# Patient Record
Sex: Male | Born: 1945 | Race: White | Hispanic: No | Marital: Married | Smoking: Former smoker
Health system: Southern US, Community
[De-identification: ages and names within clinical notes are randomized; demographics above are authoritative.]

## PROBLEM LIST (undated history)

## (undated) DIAGNOSIS — I10 Essential (primary) hypertension: Secondary | ICD-10-CM

## (undated) DIAGNOSIS — F4310 Post-traumatic stress disorder, unspecified: Secondary | ICD-10-CM

## (undated) DIAGNOSIS — I739 Peripheral vascular disease, unspecified: Secondary | ICD-10-CM

## (undated) DIAGNOSIS — F419 Anxiety disorder, unspecified: Secondary | ICD-10-CM

## (undated) DIAGNOSIS — I219 Acute myocardial infarction, unspecified: Secondary | ICD-10-CM

## (undated) DIAGNOSIS — I2510 Atherosclerotic heart disease of native coronary artery without angina pectoris: Secondary | ICD-10-CM

## (undated) DIAGNOSIS — F32A Depression, unspecified: Secondary | ICD-10-CM

## (undated) DIAGNOSIS — J189 Pneumonia, unspecified organism: Secondary | ICD-10-CM

## (undated) DIAGNOSIS — F329 Major depressive disorder, single episode, unspecified: Secondary | ICD-10-CM

## (undated) DIAGNOSIS — G4730 Sleep apnea, unspecified: Secondary | ICD-10-CM

## (undated) DIAGNOSIS — G589 Mononeuropathy, unspecified: Secondary | ICD-10-CM

## (undated) DIAGNOSIS — E7800 Pure hypercholesterolemia, unspecified: Secondary | ICD-10-CM

## (undated) DIAGNOSIS — K759 Inflammatory liver disease, unspecified: Secondary | ICD-10-CM

## (undated) DIAGNOSIS — C801 Malignant (primary) neoplasm, unspecified: Secondary | ICD-10-CM

## (undated) DIAGNOSIS — G5600 Carpal tunnel syndrome, unspecified upper limb: Secondary | ICD-10-CM

## (undated) DIAGNOSIS — I639 Cerebral infarction, unspecified: Secondary | ICD-10-CM

## (undated) HISTORY — PX: KNEE ARTHROSCOPY: SUR90

## (undated) HISTORY — PX: EYE SURGERY: SHX253

## (undated) HISTORY — PX: LUMBAR SPINE SURGERY: SHX701

## (undated) HISTORY — PX: TONSILLECTOMY: SUR1361

## (undated) HISTORY — PX: NASAL SINUS SURGERY: SHX719

## (undated) HISTORY — PX: COLONOSCOPY: SHX174

## (undated) HISTORY — PX: CARDIAC CATHETERIZATION: SHX172

## (undated) HISTORY — PX: CATARACT EXTRACTION: SUR2

## (undated) HISTORY — PX: CARPAL TUNNEL RELEASE: SHX101

## (undated) HISTORY — PX: CORONARY ANGIOPLASTY: SHX604

---

## 1998-08-11 ENCOUNTER — Emergency Department (HOSPITAL_COMMUNITY): Admission: EM | Admit: 1998-08-11 | Discharge: 1998-08-11 | Payer: Self-pay | Admitting: Emergency Medicine

## 1998-08-11 ENCOUNTER — Encounter: Payer: Self-pay | Admitting: Emergency Medicine

## 1998-08-12 ENCOUNTER — Ambulatory Visit (HOSPITAL_COMMUNITY): Admission: RE | Admit: 1998-08-12 | Discharge: 1998-08-12 | Payer: Self-pay | Admitting: Internal Medicine

## 1998-08-18 ENCOUNTER — Encounter: Payer: Self-pay | Admitting: Pulmonary Disease

## 1998-08-18 ENCOUNTER — Ambulatory Visit (HOSPITAL_COMMUNITY): Admission: RE | Admit: 1998-08-18 | Discharge: 1998-08-18 | Payer: Self-pay | Admitting: Pulmonary Disease

## 1999-05-26 ENCOUNTER — Ambulatory Visit (HOSPITAL_BASED_OUTPATIENT_CLINIC_OR_DEPARTMENT_OTHER): Admission: RE | Admit: 1999-05-26 | Discharge: 1999-05-26 | Payer: Self-pay | Admitting: Orthopedic Surgery

## 2010-06-26 HISTORY — PX: OTHER SURGICAL HISTORY: SHX169

## 2011-04-17 ENCOUNTER — Emergency Department (HOSPITAL_COMMUNITY): Payer: Non-veteran care

## 2011-04-17 ENCOUNTER — Inpatient Hospital Stay (HOSPITAL_COMMUNITY)
Admission: EM | Admit: 2011-04-17 | Discharge: 2011-04-18 | DRG: 313 | Disposition: A | Discharge status: Home / Self Care | Payer: Non-veteran care | Source: Ambulatory Visit | Attending: Cardiology | Admitting: Cardiology

## 2011-04-17 DIAGNOSIS — Z87891 Personal history of nicotine dependence: Secondary | ICD-10-CM

## 2011-04-17 DIAGNOSIS — R079 Chest pain, unspecified: Secondary | ICD-10-CM

## 2011-04-17 DIAGNOSIS — G5600 Carpal tunnel syndrome, unspecified upper limb: Secondary | ICD-10-CM | POA: Diagnosis present

## 2011-04-17 DIAGNOSIS — Z8673 Personal history of transient ischemic attack (TIA), and cerebral infarction without residual deficits: Secondary | ICD-10-CM

## 2011-04-17 DIAGNOSIS — F4310 Post-traumatic stress disorder, unspecified: Secondary | ICD-10-CM | POA: Diagnosis present

## 2011-04-17 DIAGNOSIS — I10 Essential (primary) hypertension: Secondary | ICD-10-CM | POA: Diagnosis present

## 2011-04-17 DIAGNOSIS — E669 Obesity, unspecified: Secondary | ICD-10-CM | POA: Diagnosis present

## 2011-04-17 DIAGNOSIS — Z7982 Long term (current) use of aspirin: Secondary | ICD-10-CM

## 2011-04-17 DIAGNOSIS — I2510 Atherosclerotic heart disease of native coronary artery without angina pectoris: Secondary | ICD-10-CM | POA: Diagnosis present

## 2011-04-17 DIAGNOSIS — R0789 Other chest pain: Principal | ICD-10-CM | POA: Diagnosis present

## 2011-04-17 LAB — POCT I-STAT, CHEM 8
BUN: 15 mg/dL (ref 6–23)
Calcium, Ion: 1.2 mmol/L (ref 1.12–1.32)
Chloride: 104 mEq/L (ref 96–112)
Creatinine, Ser: 0.7 mg/dL (ref 0.50–1.35)
Glucose, Bld: 132 mg/dL — ABNORMAL HIGH (ref 70–99)
HCT: 33 % — ABNORMAL LOW (ref 39.0–52.0)
Hemoglobin: 11.2 g/dL — ABNORMAL LOW (ref 13.0–17.0)
Potassium: 3.6 mEq/L (ref 3.5–5.1)
Sodium: 141 mEq/L (ref 135–145)
TCO2: 24 mmol/L (ref 0–100)

## 2011-04-17 LAB — DIFFERENTIAL
Basophils Absolute: 0 10*3/uL (ref 0.0–0.1)
Basophils Relative: 0 % (ref 0–1)
Eosinophils Absolute: 0.2 10*3/uL (ref 0.0–0.7)
Eosinophils Relative: 2 % (ref 0–5)
Lymphocytes Relative: 38 % (ref 12–46)
Lymphs Abs: 2.8 10*3/uL (ref 0.7–4.0)
Monocytes Absolute: 0.6 10*3/uL (ref 0.1–1.0)
Monocytes Relative: 8 % (ref 3–12)
Neutro Abs: 3.8 10*3/uL (ref 1.7–7.7)
Neutrophils Relative %: 52 % (ref 43–77)

## 2011-04-17 LAB — CBC
MCV: 84.7 fL (ref 78.0–100.0)
Platelets: 171 10*3/uL (ref 150–400)
RDW: 12.2 % (ref 11.5–15.5)
WBC: 7.4 10*3/uL (ref 4.0–10.5)

## 2011-04-18 DIAGNOSIS — R079 Chest pain, unspecified: Secondary | ICD-10-CM

## 2011-04-18 LAB — COMPREHENSIVE METABOLIC PANEL
Alkaline Phosphatase: 88 U/L (ref 39–117)
BUN: 15 mg/dL (ref 6–23)
Chloride: 103 mEq/L (ref 96–112)
Creatinine, Ser: 0.7 mg/dL (ref 0.50–1.35)
GFR calc Af Amer: >90 mL/min (ref >90)
Glucose, Bld: 136 mg/dL — ABNORMAL HIGH (ref 70–99)
Potassium: 3.7 mEq/L (ref 3.5–5.1)
Total Bilirubin: 0.3 mg/dL (ref 0.3–1.2)
Total Protein: 6.5 g/dL (ref 6.0–8.3)

## 2011-04-18 LAB — CBC
HCT: 35.2 % — ABNORMAL LOW (ref 39.0–52.0)
MCH: 30.6 pg (ref 26.0–34.0)
MCHC: 36.4 g/dL — ABNORMAL HIGH (ref 30.0–36.0)
MCV: 84.2 fL (ref 78.0–100.0)
Platelets: 162 10*3/uL (ref 150–400)
RDW: 12.2 % (ref 11.5–15.5)
WBC: 6.1 10*3/uL (ref 4.0–10.5)

## 2011-04-18 LAB — CK TOTAL AND CKMB (NOT AT ARMC): Total CK: 66 U/L (ref 7–232)

## 2011-04-18 LAB — CARDIAC PANEL(CRET KIN+CKTOT+MB+TROPI)
CK, MB: 2.1 ng/mL (ref 0.3–4.0)
Relative Index: INVALID (ref 0.0–2.5)
Total CK: 71 U/L (ref 7–232)
Troponin I: <0.3 ng/mL (ref <0.30)

## 2011-04-18 LAB — PROTIME-INR: Prothrombin Time: 13.3 seconds (ref 11.6–15.2)

## 2011-04-18 LAB — POCT I-STAT TROPONIN I: Troponin i, poc: 0 ng/mL (ref 0.00–0.08)

## 2011-04-18 LAB — MRSA PCR SCREENING: MRSA by PCR: NEGATIVE

## 2011-04-18 NOTE — H&P (Signed)
NAME:  NIKOLAUS, PIENTA NO.:  [id number]  MEDICAL RECORD NO.:  [id number]  LOCATION:  [facility]                         FACILITY:  [facility]  PHYSICIAN:  Zacarias Pontes, MD       DATE OF BIRTH:  [birth year]  DATE OF ADMISSION:  04/18/2011 DATE OF DISCHARGE:                             HISTORY & PHYSICAL   PRIMARY CARDIOLOGIST:  At the [facility], Dr. Yetta Barre.  CHIEF COMPLAINT:  Chest pain.  HISTORY OF PRESENT ILLNESS:  Mr. 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  In preparation for elective carpal tunnel surgery, the patient underwent preop coronary angiography at the [facility] several weeks ago, which reportedly revealed focal disease which they reportedly attempt to revascularize via PCI.  PCI was not successful and was complicated by a ramus dissection.  All of this is per the patient and the family, as we have no official records to review tonight.  Mr. Prime spent approximately 1 week in the hospital and was discharged approximately 10 days ago. His hospitalization was further complicated by what sounds to have a been a potentially asystolic arrest after receiving an IV dose of Dilaudid. He regained a pulse promptly, but here was  subsequently clinical concern for a resulting stroke affecting his left side. All of this is per the wife as we do not yet have records to confirm the account.  The tentative plan had been for a repeat catheterization scheduled for next Monday.  Today, he has had left chest and arm burning pain for the better part of the day.  He denies nausea and vomiting.  He does report some associated diaphoresis.  The pain starts in the left chest and radiates to his side and then up to his shoulder and subsequently down his arm.  He describes it as a burning.  He reports  having felt milder versions of these symptoms over the preceding week and a half, but nothing to the extent of what he has felt today.  He denies shortness of breath.  He denies PND and denies orthopnea.  No palpitations.  No fevers and no sick contacts.  No diarrhea.  PAST MEDICAL HISTORY: 1. Coronary artery disease, reportedly diagnosed at the [facility]     several weeks ago.  By the family's report, there was narrowing     that the doctors there attempted to fix, but the procedure was     complicated by dissection of the ramus intermedius.  We have no     further information at this time and we will work to obtain records     to clarify his anatomy and his overall course during his     hospitalization. 2. 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 3. PTSD. 4. Hypertension. 5. Obesity. 6. Possible CVA whilke hospitalized at [facility] a few weeks ago  SOCIAL HISTORY:  Remote tobacco having quit approximately 15 years ago. He is married and is accompanied tonight in the emergency room by his wife, as well  as other family members.  FAMILY HISTORY:  Noncontributory.  REVIEW OF SYSTEMS:  As per HPI otherwise is comprehensively negative.  ALLERGIES:  DILAUDID and CLINDAMYCIN.  MEDICATIONS: 1. Carvedilol 3.125 mg p.o. b.i.d. 2. Clopidogrel 75 mg daily. 3. Pravastatin 40 mg daily. 4. Ondansetron 8 mg p.o. p.r.n. nausea. 5. Aspirin 81 mg daily. 6. Bupropion 100 mg daily. 7. Clonazepam 1 mg p.o. at bedtime. 8. Gabapentin 100 mg p.o. b.i.d. 9. Sertraline 200 mg p.o. daily.  PHYSICAL EXAMINATION:  VITAL SIGNS:  On physical exam, patient's heart rate is 62 with a blood pressure of 125/70.  He is breathing comfortably 16 times a minute, satting 98% on 2 L nasal cannula. GENERAL:  He is in mild distress due to his chest and left arm burning pain which is ongoing during my evaluation. HEENT:  Notable for a supple neck with no masses or lymphadenopathy. His JVP does not appear to be  appreciably elevated. CARDIAC:  Notable for normal S1, S2 with no murmurs, rubs, or gallops. LUNGS:  Clear to auscultation bilaterally with no crackles or wheezing present. ABDOMEN:  Soft, nontender, nondistended, but obese.  No abdominal bruits are appreciated. EXTREMITIES:  Warm and well perfused with no lower extremity edema present. NEUROLOGICAL:  He is alert and oriented x3. MUSCULOSKELETAL:  His strength appears symmetric in his bilateral lower extremities with subtle weakness in his left upper extremity compared with his right, though his effort was perhaps limited by his ongoing pain issues.  LABORATORY EVALUATION:  White blood cell count is 7000, hematocrit 33, platelets 171.  Sodium 139, potassium 3.7, chloride 103, bicarb 26, BUN 15, creatinine 0.7 with a glucose of 136.  INR is 0.9 with a troponin of 0 done in point of care fashion.  ECG demonstrates normal sinus rhythm with no ST-segment abnormalities and no Q-waves.  IMPRESSION:  This is a 65 year old gentleman with hypertension and reported coronary artery disease presenting with a full day of debilitating left arm and chest burning pain with no evidence by ECG or biomarkers of ischemia thus far.  Given the recent reported history at the [place] in [place] we are obligated to carefully monitor his symptoms and thoroughly rule out an myocardial infarction.  However, I also wonder whether he has a distinct musculoskeletal component to his symptoms given his description and their atypical nature for a coronary driven process.  PLAN:  We will admit him to the step-down unit on intravenous heparin with a plan to wean his nitroglycerin infusion given his headache. Should his next set of cardiac biomarkers return negative, we will discontinue the heparin in light of his reported history at the [place] of having had a mild stroke in the setting of Dilaudid-induced hypotension following his catheterization.  We will continue to trend  his enzymes and check serial ECGs.  We will also continue his beta-blocker, his statin, and his dual anti-platelet therapy in the form of aspirin and Plavix.  Perhaps most importantly we will work to obtain [place] records including his catheterization which will help [place] define his coronary anatomy.  Given what I think is a clinical likelihood of an inflammatory chest wall and musculoskeletal process, we will empirically give a trial of IV ketorolac at add 15 mg x1 in the emergency room and assess his response accordingly.  I am hopeful that we will be able to come off the nitroglycerin infusion and similarly hopeful that with the next set of negative enzymes, we will be able to come off of the  intravenous heparin in light of his mild stroke a couple of weeks ago.  We will then work to consolidate the [place] records with his clinical presentation here.  I discussed the plan with the patient and with his family.  They voiced understanding.  Their questions were answered to the best of my ability.          ______________________________ Zacarias Pontes, MD     DM/MEDQ  D:  04/18/2011  T:  04/18/2011  Job:  [id number]  Electronically Signed by Zacarias Pontes MD on 04/18/2011 06:26:04 AM

## 2011-04-20 NOTE — Discharge Summary (Addendum)
[name], [name] NO.:  [id number]  MEDICAL RECORD NO.:  [id number]  LOCATION:  2923                         FACILITY:  [facility]  PHYSICIAN:  Vesta Mixer, M.D. DATE OF BIRTH:  July 05, 1945  DATE OF ADMISSION:  04/17/2011 DATE OF DISCHARGE:  04/18/2011                              DISCHARGE SUMMARY   DISCHARGE DIAGNOSES: 1. Chest pain, felt musculoskeletal.     a.     Negative cardiac enzymes.     b.     Plan catheterization at the [facility] as previously      scheduled. 2. Coronary artery disease, per the patient report diagnosed at [facility] several weeks ago.  Per family's report, there was narrowing     that was attempted to be fix, but the procedure was complicated by     dissection of the ramus intermedius, and [facility]. 3. Possible cerebrovascular accident during last admission at the     [facility] in the setting of Dilaudid-induced hypotension following     his catheterization per report. 4. Posttraumatic stress disorder. 5. Hypertension. 6. Carpal tunnel on the right wrist. 7. Obesity.  HOSPITAL COURSE:  [name] [name] is a 65 year old gentleman with the above history who presented to [facility] for left chest, arm and burning pain for the better part of the day without nausea or vomiting. He did associate some diaphoresis with the episode.  He described as a burning sensation, but denied any shortness of breath.  Initial enzymes were negative.  An EKG demonstrates normal sinus rhythm and ST-segment abnormalities and no Q-waves.  The admitting physician suspected a component of musculoskeletal etiology and he was subsequently treated with ketorolac in the ER.  He was observed in the Cardiac ICU Step-Down Unit and overnight became pain free.  Cardiac enzymes remained negative x3.  Dr. Elease Hashimoto has seen and examined him today and felt he is stable for discharge, and should follow up with the [facility]  as previously scheduled.  DISCHARGE LABORATORY DATA:  WBC 6.1, hemoglobin 12.8, hematocrit 35.2, platelet count 162.  Sodium 141, potassium 3.6, chloride 102, CO2 26, glucose 132, BUN 15, creatinine 0.70.  LFTs were normal.  Amylase and lipase were normal.  Cardiac enzymes are negative x3.  STUDIES:  Chest x-ray on April 18, 2011, showed mild vascular congestion noted with mild bibasilar airspace opacities could reflects multifocal pneumonia or very possible mild edema.  DISCHARGE MEDICATIONS: 1. Aspirin 81 mg daily. 2. Bupropion 100 mg daily. 3. Coreg 3.125 mg b.i.d. 4. Clonazepam 1 mg daily at bedtime p.r.n. sleep. 5. Clopidogrel 75 mg every morning. 6. Gabapentin 100 mg b.i.d. 7. Nitroglycerin sublingual 0.4 mg every 5 minutes as needed up to 3     doses for chest pain. 8. Ondansetron 8 mg half tablet q.8 h. p.r.n. nausea. 9. Pravachol 80 mg at bedtime. 10.Sertraline 200 mg daily.  DISPOSITION:  [name] [name] will be discharged in stable condition to home. He is instructed to increase activity slowly and follow a low-sodium, heart-healthy diet.  He should keep his IV site clean and  dry.  He will follow up with the [facility] as previously scheduled and anticipation for his catheterization.  DURATION OF DISCHARGE ENCOUNTERED:  Greater than 30 minutes including physician and PA time.     Ronie Spies, P.A.C.   ______________________________ Vesta Mixer, M.D.    DD/MEDQ  D:  04/18/2011  T:  04/19/2011  Job:  [id number]  Electronically Signed by Kristeen Miss M.D. on 04/20/2011 05:42:25 AM Electronically Signed by Ronie Spies  on 04/25/2011 01:57:49 PM MedRecNo: [id number] [facility], Account: [id number], DocSeq: [id number] Dictation error: Gabapentin should read t.i.d., not b.i.d. Electronically Signed by Ronie Spies  on 04/20/2011 10:26:44 AM

## 2011-06-05 ENCOUNTER — Emergency Department (HOSPITAL_COMMUNITY): Payer: Non-veteran care

## 2011-06-05 ENCOUNTER — Inpatient Hospital Stay (HOSPITAL_COMMUNITY)
Admission: EM | Admit: 2011-06-05 | Discharge: 2011-06-08 | DRG: 057 | Disposition: A | Discharge status: Home Health | Payer: Non-veteran care | Source: Ambulatory Visit | Attending: Family Medicine | Admitting: Family Medicine

## 2011-06-05 ENCOUNTER — Other Ambulatory Visit: Payer: Self-pay

## 2011-06-05 ENCOUNTER — Encounter: Payer: Self-pay | Admitting: Emergency Medicine

## 2011-06-05 DIAGNOSIS — I69998 Other sequelae following unspecified cerebrovascular disease: Principal | ICD-10-CM

## 2011-06-05 DIAGNOSIS — R42 Dizziness and giddiness: Secondary | ICD-10-CM

## 2011-06-05 DIAGNOSIS — I10 Essential (primary) hypertension: Secondary | ICD-10-CM | POA: Diagnosis present

## 2011-06-05 DIAGNOSIS — Z7982 Long term (current) use of aspirin: Secondary | ICD-10-CM

## 2011-06-05 DIAGNOSIS — H532 Diplopia: Secondary | ICD-10-CM | POA: Diagnosis present

## 2011-06-05 DIAGNOSIS — H02409 Unspecified ptosis of unspecified eyelid: Secondary | ICD-10-CM | POA: Diagnosis present

## 2011-06-05 DIAGNOSIS — I2510 Atherosclerotic heart disease of native coronary artery without angina pectoris: Secondary | ICD-10-CM | POA: Diagnosis present

## 2011-06-05 DIAGNOSIS — E785 Hyperlipidemia, unspecified: Secondary | ICD-10-CM | POA: Diagnosis present

## 2011-06-05 DIAGNOSIS — R29810 Facial weakness: Secondary | ICD-10-CM | POA: Diagnosis present

## 2011-06-05 DIAGNOSIS — Z87891 Personal history of nicotine dependence: Secondary | ICD-10-CM

## 2011-06-05 DIAGNOSIS — R471 Dysarthria and anarthria: Secondary | ICD-10-CM | POA: Diagnosis present

## 2011-06-05 DIAGNOSIS — R209 Unspecified disturbances of skin sensation: Secondary | ICD-10-CM | POA: Diagnosis present

## 2011-06-05 DIAGNOSIS — Z7902 Long term (current) use of antithrombotics/antiplatelets: Secondary | ICD-10-CM

## 2011-06-05 DIAGNOSIS — Z9861 Coronary angioplasty status: Secondary | ICD-10-CM

## 2011-06-05 DIAGNOSIS — I639 Cerebral infarction, unspecified: Secondary | ICD-10-CM | POA: Diagnosis present

## 2011-06-05 HISTORY — DX: Atherosclerotic heart disease of native coronary artery without angina pectoris: I25.10

## 2011-06-05 HISTORY — DX: Cerebral infarction, unspecified: I63.9

## 2011-06-05 LAB — DIFFERENTIAL
Basophils Absolute: 0 10*3/uL (ref 0.0–0.1)
Basophils Relative: 0 % (ref 0–1)
Eosinophils Relative: 2 % (ref 0–5)
Monocytes Absolute: 0.6 10*3/uL (ref 0.1–1.0)
Monocytes Relative: 9 % (ref 3–12)
Neutro Abs: 3.5 10*3/uL (ref 1.7–7.7)

## 2011-06-05 LAB — APTT: aPTT: 35 seconds (ref 24–37)

## 2011-06-05 LAB — COMPREHENSIVE METABOLIC PANEL
Albumin: 3.8 g/dL (ref 3.5–5.2)
BUN: 14 mg/dL (ref 6–23)
CO2: 26 mEq/L (ref 19–32)
Calcium: 9.6 mg/dL (ref 8.4–10.5)
Chloride: 105 mEq/L (ref 96–112)
Creatinine, Ser: 0.71 mg/dL (ref 0.50–1.35)
GFR calc non Af Amer: >90 mL/min (ref >90)
Total Bilirubin: 0.4 mg/dL (ref 0.3–1.2)

## 2011-06-05 LAB — POCT I-STAT, CHEM 8
Creatinine, Ser: 0.8 mg/dL (ref 0.50–1.35)
Glucose, Bld: 112 mg/dL — ABNORMAL HIGH (ref 70–99)
HCT: 36 % — ABNORMAL LOW (ref 39.0–52.0)
Hemoglobin: 12.2 g/dL — ABNORMAL LOW (ref 13.0–17.0)
TCO2: 26 mmol/L (ref 0–100)

## 2011-06-05 LAB — CBC
HCT: 35.8 % — ABNORMAL LOW (ref 39.0–52.0)
Hemoglobin: 13.1 g/dL (ref 13.0–17.0)
MCHC: 36.6 g/dL — ABNORMAL HIGH (ref 30.0–36.0)
MCV: 85.2 fL (ref 78.0–100.0)
RDW: 12.5 % (ref 11.5–15.5)

## 2011-06-05 LAB — CK TOTAL AND CKMB (NOT AT ARMC)
CK, MB: 2.5 ng/mL (ref 0.3–4.0)
Relative Index: 2.3 (ref 0.0–2.5)
Total CK: 107 U/L (ref 7–232)

## 2011-06-05 MED ORDER — CLOPIDOGREL BISULFATE 75 MG PO TABS
75.0000 mg | ORAL_TABLET | Freq: Every day | ORAL | Status: DC
  Administered 2011-06-06 – 2011-06-08 (×3): 75 mg via ORAL
  Filled 2011-06-05 (×3): qty 1

## 2011-06-05 MED ORDER — ONDANSETRON HCL 4 MG/2ML IJ SOLN
4.0000 mg | Freq: Once | INTRAMUSCULAR | Status: AC
  Administered 2011-06-05: 4 mg via INTRAVENOUS
  Filled 2011-06-05: qty 2

## 2011-06-05 MED ORDER — NITROGLYCERIN 0.4 MG SL SUBL: 0.4000 mg | SUBLINGUAL_TABLET | SUBLINGUAL | Status: DC | PRN

## 2011-06-05 MED ORDER — METOPROLOL TARTRATE 12.5 MG HALF TABLET
12.5000 mg | ORAL_TABLET | Freq: Two times a day (BID) | ORAL | Status: DC
  Administered 2011-06-06 (×2): 12.5 mg via ORAL
  Filled 2011-06-05 (×3): qty 1

## 2011-06-05 MED ORDER — SIMVASTATIN 40 MG PO TABS
40.0000 mg | ORAL_TABLET | Freq: Every day | ORAL | Status: DC
  Filled 2011-06-05: qty 1

## 2011-06-05 MED ORDER — CLONAZEPAM 1 MG PO TABS
1.0000 mg | ORAL_TABLET | Freq: Every day | ORAL | Status: DC
  Administered 2011-06-06 – 2011-06-07 (×2): 1 mg via ORAL
  Filled 2011-06-05 (×2): qty 1

## 2011-06-05 MED ORDER — ONDANSETRON HCL 4 MG PO TABS: 8.0000 mg | ORAL_TABLET | Freq: Three times a day (TID) | ORAL | Status: DC | PRN

## 2011-06-05 MED ORDER — RANOLAZINE ER 500 MG PO TB12
1000.0000 mg | ORAL_TABLET | Freq: Two times a day (BID) | ORAL | Status: DC
  Administered 2011-06-06 (×2): 1000 mg via ORAL
  Filled 2011-06-05 (×3): qty 2

## 2011-06-05 MED ORDER — SENNOSIDES-DOCUSATE SODIUM 8.6-50 MG PO TABS: 1.0000 | ORAL_TABLET | Freq: Every evening | ORAL | Status: DC | PRN

## 2011-06-05 MED ORDER — SODIUM CHLORIDE 0.9 % IV SOLN
INTRAVENOUS | Status: DC
  Administered 2011-06-06: 02:00:00 via INTRAVENOUS

## 2011-06-05 MED ORDER — ASPIRIN 81 MG PO CHEW
81.0000 mg | CHEWABLE_TABLET | Freq: Every day | ORAL | Status: DC
  Administered 2011-06-06 – 2011-06-08 (×3): 81 mg via ORAL
  Filled 2011-06-05 (×3): qty 1

## 2011-06-05 MED ORDER — MECLIZINE HCL 25 MG PO TABS
25.0000 mg | ORAL_TABLET | Freq: Once | ORAL | Status: AC
  Administered 2011-06-05: 25 mg via ORAL
  Filled 2011-06-05: qty 1

## 2011-06-05 MED ORDER — LORAZEPAM 2 MG/ML IJ SOLN
1.0000 mg | Freq: Once | INTRAMUSCULAR | Status: AC
  Administered 2011-06-05: 2 mg via INTRAVENOUS
  Filled 2011-06-05: qty 1

## 2011-06-05 NOTE — ED Notes (Signed)
CBG=97 mg/dl 

## 2011-06-05 NOTE — ED Notes (Signed)
Pt given Ativan 1mg  prior to MRI.

## 2011-06-05 NOTE — ED Provider Notes (Addendum)
History     CSN: [id number] Arrival date & time: 06/05/2011  5:04 PM   First MD Initiated Contact with Patient 06/05/11 1716      Chief Complaint  Patient presents with  . Weakness    (Consider location/radiation/quality/duration/timing/severity/associated sxs/prior treatment) HPI Patient states he woke up this morning feeling very nauseated. It is also having a lot of trouble with dizziness. Patient states it has persisted throughout the day. His wife noted that his speech seemed to be abnormal for a period of time but that has gotten better. He also felt like the left side of his face was drooping and it felt numb. Patient persists in having that numbness feeling. He has not had any trouble with focal arm or leg weakness. He does not have a headache but does feel significant dizziness and feels like he is having trouble focusing. She noticed all the symptoms when he woke up this morning. He does state that he had similar symptoms in the past when he was told he  had a stroke associated with a cardiac procedure  PMHx: HTN History reviewed. No pertinent past surgical history.  No family history on file.  History  Substance Use Topics  . Smoking status: Not on file  . Smokeless tobacco: Not on file  . Alcohol Use: Not on file      Review of Systems  All other systems reviewed and are negative.    Allergies  Clindamycin/lincomycin and Dilaudid  Home Medications   Current Outpatient Rx  Name Route Sig Dispense Refill  . ASPIRIN 81 MG PO TABS Oral Take 81 mg by mouth daily.      Marland Kitchen CLONAZEPAM 1 MG PO TABS Oral Take 1 mg by mouth at bedtime.      . CLOPIDOGREL BISULFATE 75 MG PO TABS Oral Take 75 mg by mouth daily.      Marland Kitchen METOPROLOL TARTRATE 25 MG PO TABS Oral Take 12.5 mg by mouth 2 (two) times daily.      Marland Kitchen ONDANSETRON HCL 8 MG PO TABS Oral Take 8 mg by mouth every 8 (eight) hours as needed. For nausea      . PRAVASTATIN SODIUM 80 MG PO TABS Oral Take 80 mg by mouth at  bedtime.      Marland Kitchen RANOLAZINE 500 MG PO TB12 Oral Take 1,000 mg by mouth 2 (two) times daily.      Marland Kitchen NITROGLYCERIN 0.4 MG SL SUBL Sublingual Place 0.4 mg under the tongue every 5 (five) minutes as needed. For chest pain       BP 125/71  Pulse 72  Temp(Src) 97.9 F (36.6 C) (Oral)  Resp 16  SpO2 97%  Physical Exam  Nursing note and vitals reviewed. Constitutional: He is oriented to person, place, and time. He appears well-developed and well-nourished. No distress.  HENT:  Head: Normocephalic and atraumatic.  Right Ear: External ear normal.  Left Ear: External ear normal.  Mouth/Throat: Oropharynx is clear and moist.  Eyes: Conjunctivae are normal. Right eye exhibits no discharge. Left eye exhibits no discharge. No scleral icterus.  Neck: Neck supple. No tracheal deviation present.  Cardiovascular: Normal rate, regular rhythm and intact distal pulses.   Pulmonary/Chest: Effort normal and breath sounds normal. No stridor. No respiratory distress. He has no wheezes. He has no rales.  Abdominal: Soft. Bowel sounds are normal. He exhibits no distension. There is no tenderness. There is no rebound and no guarding.  Musculoskeletal: He exhibits no edema and no tenderness.  Neurological: He is alert and oriented to person, place, and time. He has normal strength. No sensory deficit. Cranial nerve deficit: No facial droop, tongue sticks out midline, normal hearing. He exhibits normal muscle tone. He displays no seizure activity. Coordination normal.       No pronator drift bilateral upper extrem, able to hold both legs off bed for 5 seconds, sensation intact in all extremities, no visual field cuts, no left or right sided neglect, no deficits in extraocular movements noted however patient does have difficulty focusing on my hand and finger to  Skin: Skin is warm and dry. No rash noted.  Psychiatric: He has a normal mood and affect.    ED Course  Procedures (including critical care time)  Date:  06/05/2011  Rate: 73  Rhythm: normal sinus rhythm  QRS Axis: normal  Intervals: normal  ST/T Wave abnormalities: normal  Conduction Disutrbances:Incomplete right bundle branch block  Narrative Interpretation:   Old EKG Reviewed: none available   Labs Reviewed  CBC - Abnormal; Notable for the following:    RBC 4.20 (*)    HCT 35.8 (*)    MCHC 36.6 (*)    All other components within normal limits  COMPREHENSIVE METABOLIC PANEL - Abnormal; Notable for the following:    Glucose, Bld 111 (*)    All other components within normal limits  POCT I-STAT, CHEM 8 - Abnormal; Notable for the following:    Glucose, Bld 112 (*)    Hemoglobin 12.2 (*)    HCT 36.0 (*)    All other components within normal limits  PROTIME-INR  APTT  DIFFERENTIAL  CK TOTAL AND CKMB  TROPONIN I  GLUCOSE, CAPILLARY  I-STAT, CHEM 8  POCT CBG MONITORING   Ct Head Wo Contrast  06/05/2011  *RADIOLOGY REPORT*  Clinical Data: Dizziness.  Left facial numbness.  Vertigo.  Nausea. Unsteady gait.  CT HEAD WITHOUT CONTRAST  Technique:  Contiguous axial images were obtained from the base of the skull through the vertex without contrast.  Comparison: None.  Findings: No intracranial hemorrhage.  Questionable acute infarct left paracentral mid brain?  No intracranial mass lesion detected on this unenhanced exam.  No hydrocephalus.  IMPRESSION: Question small acute non hemorrhagic left paracentral mid brain infarct?  No intracranial hemorrhage.  Original Report Authenticated By: Fuller Canada, M.D.     MDM  Patient presents with significant vertigo type symptoms and neurologic complaints that suggest the possibility of an acute stroke. The CT scan does suggest the possibility of a left paracentral mid brain infarct. This time I will plan on admitting the patient for hospital for further evaluation. I will order an MRI of the brain as well to evaluate the abnormal CT scan finding further.        Celene Kras,  MD 06/05/11 1945  Celene Kras, MD 06/05/11 [phone]

## 2011-06-05 NOTE — ED Notes (Signed)
Reported CBG to Dr. Roselyn Bering

## 2011-06-05 NOTE — ED Notes (Signed)
Introduced self to patient. Pt stated that he came to the hospital because he has been dizzy and nauseated all day. According to wife, pt has had generalized weakness for a few days. Pt is completely alert and oriented. Pt complaining of dizziness, worsening with laying and standing. Pt has not had any vomiting. Pt complaining of left neck pain when turning head to the right. Bowel sounds present. No pain at this time. Will continue to monitor. Updated patient about plan of care.

## 2011-06-05 NOTE — ED Notes (Signed)
Dr. Toniann Fail in to assess pt for admission.  Pt to x-ray for MRI of head.

## 2011-06-05 NOTE — H&P (Signed)
[name] [name] is an 65 y.o. male.   PCP is at [facility]. Chief Complaint: Dizziness. HPI: 65 year-old male with history of coronary disease who had a stent placed last month at [facility], present to the ER because of persistent dizziness and left facial and left upper extremity numbness. Patient's symptoms started off at 6:00 when he woke up in the morning. Has been persistent. His wife initially noticed he also had some slurred speech which improved the next few minutes. Patient is dizziness worsens when he lies down or when he tries to walk. It is associated with nausea. Patient had a cardiac cath around September or October this year during which one of the ramus dissected. During that period patient also became hypotensive after patient received Dilaudid. He suffered a stroke then. His symptoms then was left-sided weakness and blurred vision particularly on the left side. After a couple of weeks his symptoms are completely resolved from that stroke. He had another catheter done last month and had a stent placed. They do not know if he gets a drug-eluting stent are not. He is presently on Plavix and aspirin. Patient other than the numbness on the left side of his face and left upper extremity and the dizziness does not have any focal deficits. He had CAT scan of his head done the ER today which shows possibility of left midbrain stroke. Patient has been admitted for further workup.  Past Medical History  Diagnosis Date  . Coronary artery disease   . Stroke     Past Surgical History  Procedure Date  . Cardiac catheterization   . Coronary angioplasty     History reviewed. No pertinent family history. Social History:  reports that he has quit smoking. He does not have any smokeless tobacco history on file. He reports that he does not drink alcohol. His drug history not on file.  Allergies:  Allergies  Allergen Reactions  . Clindamycin/Lincomycin Other (See Comments)    Blisters in mouth  .  Dilaudid (Hydromorphone Hcl) Other (See Comments)    "CRASHES"    Medications Prior to Admission  Medication Dose Route Frequency Provider Last Rate Last Dose  . LORazepam (ATIVAN) injection 1 mg  1 mg Intravenous Once Celene Kras, MD   2 mg at 06/05/11 2112  . meclizine (ANTIVERT) tablet 25 mg  25 mg Oral Once Celene Kras, MD   25 mg at 06/05/11 1853  . ondansetron (ZOFRAN) injection 4 mg  4 mg Intravenous Once Celene Kras, MD   4 mg at 06/05/11 1905   No current outpatient prescriptions on file as of 06/05/2011.    Results for orders placed during the hospital encounter of 06/05/11 (from the past 48 hour(s))  PROTIME-INR     Status: Normal   Collection Time   06/05/11  5:21 PM      Component Value Range Comment   Prothrombin Time 13.2  11.6 - 15.2 (seconds)    INR 0.98  0.00 - 1.49    APTT     Status: Normal   Collection Time   06/05/11  5:21 PM      Component Value Range Comment   aPTT 35  24 - 37 (seconds)   CBC     Status: Abnormal   Collection Time   06/05/11  5:21 PM      Component Value Range Comment   WBC 6.5  4.0 - 10.5 (K/uL)    RBC 4.20 (*) 4.22 -  5.81 (MIL/uL)    Hemoglobin 13.1  13.0 - 17.0 (g/dL)    HCT 40.9 (*) 81.1 - 52.0 (%)    MCV 85.2  78.0 - 100.0 (fL)    MCH 31.2  26.0 - 34.0 (pg)    MCHC 36.6 (*) 30.0 - 36.0 (g/dL)    RDW 91.4  78.2 - 95.6 (%)    Platelets 193  150 - 400 (K/uL)   DIFFERENTIAL     Status: Normal   Collection Time   06/05/11  5:21 PM      Component Value Range Comment   Neutrophils Relative 54  43 - 77 (%)    Neutro Abs 3.5  1.7 - 7.7 (K/uL)    Lymphocytes Relative 35  12 - 46 (%)    Lymphs Abs 2.3  0.7 - 4.0 (K/uL)    Monocytes Relative 9  3 - 12 (%)    Monocytes Absolute 0.6  0.1 - 1.0 (K/uL)    Eosinophils Relative 2  0 - 5 (%)    Eosinophils Absolute 0.1  0.0 - 0.7 (K/uL)    Basophils Relative 0  0 - 1 (%)    Basophils Absolute 0.0  0.0 - 0.1 (K/uL)   COMPREHENSIVE METABOLIC PANEL     Status: Abnormal   Collection Time    06/05/11  5:21 PM      Component Value Range Comment   Sodium 139  135 - 145 (mEq/L)    Potassium 3.6  3.5 - 5.1 (mEq/L)    Chloride 105  96 - 112 (mEq/L)    CO2 26  19 - 32 (mEq/L)    Glucose, Bld 111 (*) 70 - 99 (mg/dL)    BUN 14  6 - 23 (mg/dL)    Creatinine, Ser 2.13  0.50 - 1.35 (mg/dL)    Calcium 9.6  8.4 - 10.5 (mg/dL)    Total Protein 7.5  6.0 - 8.3 (g/dL)    Albumin 3.8  3.5 - 5.2 (g/dL)    AST 16  0 - 37 (U/L)    ALT 16  0 - 53 (U/L)    Alkaline Phosphatase 87  39 - 117 (U/L)    Total Bilirubin 0.4  0.3 - 1.2 (mg/dL)    GFR calc non Af Amer >90  >90 (mL/min)    GFR calc Af Amer >90  >90 (mL/min)   CK TOTAL AND CKMB     Status: Normal   Collection Time   06/05/11  5:21 PM      Component Value Range Comment   Total CK 107  7 - 232 (U/L)    CK, MB 2.5  0.3 - 4.0 (ng/mL)    Relative Index 2.3  0.0 - 2.5    TROPONIN I     Status: Normal   Collection Time   06/05/11  5:21 PM      Component Value Range Comment   Troponin I <0.30  <0.30 (ng/mL)   POCT I-STAT, CHEM 8     Status: Abnormal   Collection Time   06/05/11  5:41 PM      Component Value Range Comment   Sodium 143  135 - 145 (mEq/L)    Potassium 3.7  3.5 - 5.1 (mEq/L)    Chloride 107  96 - 112 (mEq/L)    BUN 16  6 - 23 (mg/dL)    Creatinine, Ser 0.86  0.50 - 1.35 (mg/dL)    Glucose, Bld 578 (*) 70 - 99 (mg/dL)  Calcium, Ion 1.23  1.12 - 1.32 (mmol/L)    TCO2 26  0 - 100 (mmol/L)    Hemoglobin 12.2 (*) 13.0 - 17.0 (g/dL)    HCT 16.1 (*) 09.6 - 52.0 (%)   GLUCOSE, CAPILLARY     Status: Normal   Collection Time   06/05/11  6:42 PM      Component Value Range Comment   Glucose-Capillary 97  70 - 99 (mg/dL)    Ct Head Wo Contrast  06/05/2011  *RADIOLOGY REPORT*  Clinical Data: Dizziness.  Left facial numbness.  Vertigo.  Nausea. Unsteady gait.  CT HEAD WITHOUT CONTRAST  Technique:  Contiguous axial images were obtained from the base of the skull through the vertex without contrast.  Comparison: None.   Findings: No intracranial hemorrhage.  Questionable acute infarct left paracentral mid brain?  No intracranial mass lesion detected on this unenhanced exam.  No hydrocephalus.  IMPRESSION: Question small acute non hemorrhagic left paracentral mid brain infarct?  No intracranial hemorrhage.  Original Report Authenticated By: Fuller Canada, M.D.    Review of Systems  Constitutional: Negative.   HENT: Positive for neck pain.   Eyes: Positive for blurred vision.  Respiratory: Negative.   Cardiovascular: Negative for chest pain, palpitations and orthopnea.  Gastrointestinal: Negative.   Genitourinary: Negative.   Skin: Negative.   Neurological: Positive for dizziness, speech change and headaches. Negative for loss of consciousness.  Psychiatric/Behavioral: Negative.     Blood pressure 116/78, pulse 72, temperature 97.8 F (36.6 C), temperature source Oral, resp. rate 16, SpO2 97.00%. Physical Exam  Constitutional: He is oriented to person, place, and time. He appears well-developed and well-nourished. No distress.  HENT:  Head: Normocephalic and atraumatic.  Right Ear: External ear normal.  Left Ear: External ear normal.  Nose: Nose normal.  Mouth/Throat: Oropharynx is clear and moist. No oropharyngeal exudate.  Eyes: Conjunctivae and EOM are normal. Pupils are equal, round, and reactive to light. Right eye exhibits no discharge. Left eye exhibits no discharge. No scleral icterus.  Neck: Normal range of motion. Neck supple. No JVD present.  Cardiovascular: Normal rate, regular rhythm, normal heart sounds and intact distal pulses.  Exam reveals no friction rub.   No murmur heard. Respiratory: Effort normal and breath sounds normal. No stridor. No respiratory distress. He has no wheezes. He has no rales.  GI: Soft. Bowel sounds are normal. He exhibits no distension. There is no tenderness. There is no rebound.  Musculoskeletal: Normal range of motion. He exhibits no edema and no  tenderness.  Neurological: He is alert and oriented to person, place, and time. He has normal reflexes.       Moves upper and lower extremity 5/5. No obvious facial asymmetry. Tongue is midline. Able to see in both eyes.   Skin: Skin is warm and dry. He is not diaphoretic.  Psychiatric: His behavior is normal.     Assessment/Plan #1. CVA - given patient's symptoms and the CAT scan findings patient probably has CVA. He is presently getting an MRI/MRA of the brain. In addition we will get a carotid Doppler and 2-D echo. The patient will get swallow evaluation and neuro checks. Patient does complain of left-sided neck pain particularly when he moves his head to his right. The pain is more on the posterior aspect of his neck. We'll get a cervical spine x-ray and also for the carotid Doppler. #2. CAD status post recently placed stent last month with history of coronary artery dissection 2 months  ago - present the patient is chest pain-free and will continue present medications. That includes Plavix and aspirin. #3. History of hypertension and hyperlipidemia - continue present medications. #4. Former cigarette smoker quit more than 15 years ago - will check chest x-ray.  CODE STATUS - full code.                                                                                                                                                                                                                                                                                                                                                                                                                                                          Eduard Clos 06/05/2011, 9:27 PM

## 2011-06-05 NOTE — ED Notes (Signed)
Pt here with c/o generalized weakness and fatigue that started about 3 days ago as well as pressure in left side of face that started today.  Pt has hx of prior stroke that has left residual left sided weakness.  Pt alert and oriented x4 and in NAD.

## 2011-06-05 NOTE — ED Notes (Signed)
Attempted to call report, was on hold for receiving nurse approx 7 mins no one picked up.  Called again was put on hold then was told receiving nurse not available will call me back in approx 5/10 mins.

## 2011-06-06 ENCOUNTER — Observation Stay (HOSPITAL_COMMUNITY): Payer: Non-veteran care

## 2011-06-06 ENCOUNTER — Encounter (HOSPITAL_COMMUNITY): Payer: Self-pay | Admitting: *Deleted

## 2011-06-06 DIAGNOSIS — I6350 Cerebral infarction due to unspecified occlusion or stenosis of unspecified cerebral artery: Secondary | ICD-10-CM

## 2011-06-06 LAB — HEMOGLOBIN A1C
Hgb A1c MFr Bld: 5.8 % — ABNORMAL HIGH (ref <5.7)
Mean Plasma Glucose: 120 mg/dL — ABNORMAL HIGH (ref <117)

## 2011-06-06 LAB — COMPREHENSIVE METABOLIC PANEL
ALT: 16 U/L (ref 0–53)
Alkaline Phosphatase: 79 U/L (ref 39–117)
BUN: 16 mg/dL (ref 6–23)
CO2: 27 mEq/L (ref 19–32)
GFR calc Af Amer: >90 mL/min (ref >90)
GFR calc non Af Amer: 88 mL/min — ABNORMAL LOW (ref >90)
Glucose, Bld: 120 mg/dL — ABNORMAL HIGH (ref 70–99)
Potassium: 3.9 mEq/L (ref 3.5–5.1)
Sodium: 142 mEq/L (ref 135–145)
Total Bilirubin: 0.5 mg/dL (ref 0.3–1.2)

## 2011-06-06 LAB — CARDIAC PANEL(CRET KIN+CKTOT+MB+TROPI)
CK, MB: 1.8 ng/mL (ref 0.3–4.0)
Relative Index: INVALID (ref 0.0–2.5)
Relative Index: INVALID (ref 0.0–2.5)
Total CK: 79 U/L (ref 7–232)
Total CK: 71 U/L (ref 7–232)
Total CK: 61 U/L (ref 7–232)

## 2011-06-06 LAB — CBC
MCHC: 35.6 g/dL (ref 30.0–36.0)
Platelets: 176 10*3/uL (ref 150–400)
RDW: 12.6 % (ref 11.5–15.5)

## 2011-06-06 LAB — TSH: TSH: 2.635 u[IU]/mL (ref 0.350–4.500)

## 2011-06-06 LAB — LIPID PANEL: Total CHOL/HDL Ratio: 4.5 RATIO

## 2011-06-06 MED ORDER — ROSUVASTATIN CALCIUM 10 MG PO TABS
10.0000 mg | ORAL_TABLET | Freq: Every day | ORAL | Status: DC
  Administered 2011-06-06 – 2011-06-07 (×2): 10 mg via ORAL
  Filled 2011-06-06 (×3): qty 1

## 2011-06-06 MED ORDER — MECLIZINE HCL 25 MG PO TABS
25.0000 mg | ORAL_TABLET | Freq: Three times a day (TID) | ORAL | Status: DC | PRN
  Administered 2011-06-06: 25 mg via ORAL
  Filled 2011-06-06 (×2): qty 1

## 2011-06-06 MED ORDER — LORAZEPAM 2 MG/ML IJ SOLN
1.0000 mg | Freq: Four times a day (QID) | INTRAMUSCULAR | Status: DC | PRN
  Administered 2011-06-06: 1 mg via INTRAVENOUS

## 2011-06-06 MED ORDER — MECLIZINE HCL 25 MG PO TABS
25.0000 mg | ORAL_TABLET | Freq: Four times a day (QID) | ORAL | Status: DC | PRN
  Administered 2011-06-07 (×2): 25 mg via ORAL
  Filled 2011-06-06: qty 1

## 2011-06-06 MED ORDER — LORAZEPAM 2 MG/ML IJ SOLN
INTRAMUSCULAR | Status: AC
  Administered 2011-06-06: 1 mg via INTRAVENOUS
  Filled 2011-06-06: qty 1

## 2011-06-06 NOTE — Progress Notes (Signed)
Patient received from ER via stretcher, drowsy , arousable  but drifts back to sleep in the middle of responding to questions ; son states drowsiness due to ativan administered in ER.Marland KitchenDrifts noted in all extremities as patient is sleepy.

## 2011-06-06 NOTE — Progress Notes (Signed)
*  PRELIMINARY RESULTS*  Carotid Dopplers  has been performed.  No significant ICA stenosis bilaterally. Vertebral arteries are patent with antegrade flow.  Mila Homer 06/06/2011, 9:28 AM

## 2011-06-06 NOTE — Consult Note (Signed)
Reason for Consult: "vertigo that is worse with movement, left ptosis, left neck pain"  HPI: [name] [name] is an 65 y.o. Male. Who has a history of prior stroke with left hemiparesis and who presents with sudden onset vertigo, diplopia, slurred speech and left eye ptosis since yesterday morning. The patient also has neck pain on the left side. He denies right hemiparesis or any new weakness or numbness. Denies new swallowing dysfunction. He is very vertiginous and any movement of his head whatsoever results in severe vertigo.   Past Medical History  Diagnosis Date  . Coronary artery disease   . Stroke    Past Surgical History  Procedure Date  . Cardiac catheterization   . Coronary angioplasty    History reviewed. No pertinent family history.  Social History:  reports that he has quit smoking. He does not have any smokeless tobacco history on file. He reports that he does not drink alcohol. His drug history not on file.  Allergies:  Allergies  Allergen Reactions  . Clindamycin/Lincomycin Other (See Comments)    Blisters in mouth  . Dilaudid (Hydromorphone Hcl) Other (See Comments)    "CRASHES"   Medications: I have reviewed the patient's current medications.  ROS: as above  Blood pressure 107/75, pulse 69, temperature 97.3 F (36.3 C), temperature source Oral, resp. rate 18, height 5\' 9"  (1.753 m), weight 103 kg (227 lb 1.2 oz), SpO2 93.00%.  Neurological exam: AAO*2. No aphasia. Good attention span. Recall was 3 of 3 after 5 minutes. Followed complex commands. Cranial nerves: no nystagmus, EOMI, PERRL. Visual fields were full. Sensation to V1 through V3 areas of the face was reduced on the left side. There was left facial droop. Hearing to finger rub was equal and symmetrical bilaterally. There was no dysarthria or palatal deviation. Motor: strength was 5/5 on the right and about 4/5 in RUE and 4/5 in left proximal LE and 5-/5 in left distal LE. Sensory: was intact throughout to  light touch, pinprick, vibration and proprioception. Coordination: finger-to-nose dysmetric on the left side. Reflexes: were 1+ in upper extremities and 3+ at the knees and 2+ at the ankles. Plantar response was upgoing on left and downgoing on right. Gait: deferred  Results for orders placed during the hospital encounter of 06/05/11 (from the past 48 hour(s))  PROTIME-INR     Status: Normal   Collection Time   06/05/11  5:21 PM      Component Value Range Comment   Prothrombin Time 13.2  11.6 - 15.2 (seconds)    INR 0.98  0.00 - 1.49    APTT     Status: Normal   Collection Time   06/05/11  5:21 PM      Component Value Range Comment   aPTT 35  24 - 37 (seconds)   CBC     Status: Abnormal   Collection Time   06/05/11  5:21 PM      Component Value Range Comment   WBC 6.5  4.0 - 10.5 (K/uL)    RBC 4.20 (*) 4.22 - 5.81 (MIL/uL)    Hemoglobin 13.1  13.0 - 17.0 (g/dL)    HCT 16.1 (*) 09.6 - 52.0 (%)    MCV 85.2  78.0 - 100.0 (fL)    MCH 31.2  26.0 - 34.0 (pg)    MCHC 36.6 (*) 30.0 - 36.0 (g/dL)    RDW 04.5  40.9 - 81.1 (%)    Platelets 193  150 - 400 (K/uL)  DIFFERENTIAL     Status: Normal   Collection Time   06/05/11  5:21 PM      Component Value Range Comment   Neutrophils Relative 54  43 - 77 (%)    Neutro Abs 3.5  1.7 - 7.7 (K/uL)    Lymphocytes Relative 35  12 - 46 (%)    Lymphs Abs 2.3  0.7 - 4.0 (K/uL)    Monocytes Relative 9  3 - 12 (%)    Monocytes Absolute 0.6  0.1 - 1.0 (K/uL)    Eosinophils Relative 2  0 - 5 (%)    Eosinophils Absolute 0.1  0.0 - 0.7 (K/uL)    Basophils Relative 0  0 - 1 (%)    Basophils Absolute 0.0  0.0 - 0.1 (K/uL)   COMPREHENSIVE METABOLIC PANEL     Status: Abnormal   Collection Time   06/05/11  5:21 PM      Component Value Range Comment   Sodium 139  135 - 145 (mEq/L)    Potassium 3.6  3.5 - 5.1 (mEq/L)    Chloride 105  96 - 112 (mEq/L)    CO2 26  19 - 32 (mEq/L)    Glucose, Bld 111 (*) 70 - 99 (mg/dL)    BUN 14  6 - 23 (mg/dL)     Creatinine, Ser 8.11  0.50 - 1.35 (mg/dL)    Calcium 9.6  8.4 - 10.5 (mg/dL)    Total Protein 7.5  6.0 - 8.3 (g/dL)    Albumin 3.8  3.5 - 5.2 (g/dL)    AST 16  0 - 37 (U/L)    ALT 16  0 - 53 (U/L)    Alkaline Phosphatase 87  39 - 117 (U/L)    Total Bilirubin 0.4  0.3 - 1.2 (mg/dL)    GFR calc non Af Amer >90  >90 (mL/min)    GFR calc Af Amer >90  >90 (mL/min)   CK TOTAL AND CKMB     Status: Normal   Collection Time   06/05/11  5:21 PM      Component Value Range Comment   Total CK 107  7 - 232 (U/L)    CK, MB 2.5  0.3 - 4.0 (ng/mL)    Relative Index 2.3  0.0 - 2.5    TROPONIN I     Status: Normal   Collection Time   06/05/11  5:21 PM      Component Value Range Comment   Troponin I <0.30  <0.30 (ng/mL)   POCT I-STAT, CHEM 8     Status: Abnormal   Collection Time   06/05/11  5:41 PM      Component Value Range Comment   Sodium 143  135 - 145 (mEq/L)    Potassium 3.7  3.5 - 5.1 (mEq/L)    Chloride 107  96 - 112 (mEq/L)    BUN 16  6 - 23 (mg/dL)    Creatinine, Ser 9.14  0.50 - 1.35 (mg/dL)    Glucose, Bld 782 (*) 70 - 99 (mg/dL)    Calcium, Ion 9.56  1.12 - 1.32 (mmol/L)    TCO2 26  0 - 100 (mmol/L)    Hemoglobin 12.2 (*) 13.0 - 17.0 (g/dL)    HCT 21.3 (*) 08.6 - 52.0 (%)   GLUCOSE, CAPILLARY     Status: Normal   Collection Time   06/05/11  6:42 PM      Component Value Range Comment   Glucose-Capillary 97  70 - 99 (mg/dL)   CARDIAC PANEL(CRET KIN+CKTOT+MB+TROPI)     Status: Normal   Collection Time   06/06/11  1:55 AM      Component Value Range Comment   Total CK 79  7 - 232 (U/L)    CK, MB 2.1  0.3 - 4.0 (ng/mL)    Troponin I <0.30  <0.30 (ng/mL)    Relative Index RELATIVE INDEX IS INVALID  0.0 - 2.5    COMPREHENSIVE METABOLIC PANEL     Status: Abnormal   Collection Time   06/06/11  6:18 AM      Component Value Range Comment   Sodium 142  135 - 145 (mEq/L)    Potassium 3.9  3.5 - 5.1 (mEq/L)    Chloride 107  96 - 112 (mEq/L)    CO2 27  19 - 32 (mEq/L)    Glucose,  Bld 120 (*) 70 - 99 (mg/dL)    BUN 16  6 - 23 (mg/dL)    Creatinine, Ser 9.14  0.50 - 1.35 (mg/dL)    Calcium 9.1  8.4 - 10.5 (mg/dL)    Total Protein 6.9  6.0 - 8.3 (g/dL)    Albumin 3.5  3.5 - 5.2 (g/dL)    AST 15  0 - 37 (U/L)    ALT 16  0 - 53 (U/L)    Alkaline Phosphatase 79  39 - 117 (U/L)    Total Bilirubin 0.5  0.3 - 1.2 (mg/dL)    GFR calc non Af Amer 88 (*) >90 (mL/min)    GFR calc Af Amer >90  >90 (mL/min)   CBC     Status: Abnormal   Collection Time   06/06/11  6:18 AM      Component Value Range Comment   WBC 5.3  4.0 - 10.5 (K/uL)    RBC 4.07 (*) 4.22 - 5.81 (MIL/uL)    Hemoglobin 12.5 (*) 13.0 - 17.0 (g/dL)    HCT 78.2 (*) 95.6 - 52.0 (%)    MCV 86.2  78.0 - 100.0 (fL)    MCH 30.7  26.0 - 34.0 (pg)    MCHC 35.6  30.0 - 36.0 (g/dL)    RDW 21.3  08.6 - 57.8 (%)    Platelets 176  150 - 400 (K/uL)   LIPID PANEL     Status: Abnormal   Collection Time   06/06/11  6:18 AM      Component Value Range Comment   Cholesterol 183  0 - 200 (mg/dL)    Triglycerides 469 (*) <150 (mg/dL)    HDL 41  >62 (mg/dL)    Total CHOL/HDL Ratio 4.5      VLDL 34  0 - 40 (mg/dL)    LDL Cholesterol 952 (*) 0 - 99 (mg/dL)   CARDIAC PANEL(CRET KIN+CKTOT+MB+TROPI)     Status: Normal   Collection Time   06/06/11  8:50 AM      Component Value Range Comment   Total CK 71  7 - 232 (U/L)    CK, MB 1.9  0.3 - 4.0 (ng/mL)    Troponin I <0.30  <0.30 (ng/mL)    Relative Index RELATIVE INDEX IS INVALID  0.0 - 2.5     Ct Head Wo Contrast  06/05/2011  *RADIOLOGY REPORT*  Clinical Data: Dizziness.  Left facial numbness.  Vertigo.  Nausea. Unsteady gait.  CT HEAD WITHOUT CONTRAST  Technique:  Contiguous axial images were obtained from the base of the skull through the vertex without  contrast.  Comparison: None.  Findings: No intracranial hemorrhage.  Questionable acute infarct left paracentral mid brain?  No intracranial mass lesion detected on this unenhanced exam.  No hydrocephalus.  IMPRESSION:  Question small acute non hemorrhagic left paracentral mid brain infarct?  No intracranial hemorrhage.  Original Report Authenticated By: Fuller Canada, M.D.   Mr Angiogram Head Wo Contrast  06/06/2011  *RADIOLOGY REPORT*  Clinical Data:  Dizziness with left facial numbness and visual disturbance. History of coronary artery disease with recent stent placement.  History of hypertension and hyperlipidemia.  MRI HEAD WITHOUT CONTRAST MRA HEAD WITHOUT CONTRAST  Technique:  Multiplanar, multiecho pulse sequences of the brain and surrounding structures were obtained without intravenous contrast. Angiographic images of the head were obtained using MRA technique without contrast.  Comparison:  CT head earlier in the day.  MRI HEAD  Findings:  The patient was moderately uncooperative and could not remain motionless for the study.  Images are suboptimal.  Small or subtle lesions could be overlooked.  There is no evidence for acute infarction, intracranial hemorrhage, mass lesion, hydrocephalus, or extra-axial fluid.  Mild atrophy is present.  There is mild chronic microvascular ischemic change.  No foci of chronic hemorrhage are seen.  The pituitary, pineal, and cerebellar tonsils are unremarkable.  There is mild chronic sinus disease without mastoid fluid. Bilateral cataract extraction has been performed.  IMPRESSION: Atrophy and small vessel disease.  No acute intracranial findings. There is no evidence for a midbrain infarct as questioned from CT.  MRA HEAD  Findings: Dolichoectatic vasculature reflects chronic hypertension. Widely patent internal carotid arteries from the skull base through their terminus.  Basilar artery is also widely patent with right greater than left vertebral contribution.  There is mild nonstenotic irregularity of the anterior, middle, and posterior cerebral arteries without flow reducing lesion.  There is no visible intracranial aneurysm.  Posterior fossa branches appear unremarkable.   IMPRESSION: No proximal stenosis.  Mild nonstenotic intracranial atherosclerotic change.  A preliminary report of these findings was generated for the ED.  Original Report Authenticated By: Elsie Stain, M.D.   Mr Brain Wo Contrast  06/06/2011  *RADIOLOGY REPORT*  Clinical Data:  Dizziness with left facial numbness and visual disturbance. History of coronary artery disease with recent stent placement.  History of hypertension and hyperlipidemia.  MRI HEAD WITHOUT CONTRAST MRA HEAD WITHOUT CONTRAST  Technique:  Multiplanar, multiecho pulse sequences of the brain and surrounding structures were obtained without intravenous contrast. Angiographic images of the head were obtained using MRA technique without contrast.  Comparison:  CT head earlier in the day.  MRI HEAD  Findings:  The patient was moderately uncooperative and could not remain motionless for the study.  Images are suboptimal.  Small or subtle lesions could be overlooked.  There is no evidence for acute infarction, intracranial hemorrhage, mass lesion, hydrocephalus, or extra-axial fluid.  Mild atrophy is present.  There is mild chronic microvascular ischemic change.  No foci of chronic hemorrhage are seen.  The pituitary, pineal, and cerebellar tonsils are unremarkable.  There is mild chronic sinus disease without mastoid fluid. Bilateral cataract extraction has been performed.  IMPRESSION: Atrophy and small vessel disease.  No acute intracranial findings. There is no evidence for a midbrain infarct as questioned from CT.  MRA HEAD  Findings: Dolichoectatic vasculature reflects chronic hypertension. Widely patent internal carotid arteries from the skull base through their terminus.  Basilar artery is also widely patent with right greater than left vertebral contribution.  There is mild nonstenotic irregularity of the anterior, middle, and posterior cerebral arteries without flow reducing lesion.  There is no visible intracranial aneurysm.  Posterior  fossa branches appear unremarkable.  IMPRESSION: No proximal stenosis.  Mild nonstenotic intracranial atherosclerotic change.  A preliminary report of these findings was generated for the ED.  Original Report Authenticated By: Elsie Stain, M.D.   Assessment/Plan: 65 years old man with presentation suspicious for left cerebellar/brainsetm stroke. MRI was negative, but will repeat with thin cuts through brainstem. Will also obtain MRA neck to work-up vertebrobasilar insufficiency 1) Echo and CD done 2) Telemetry 3) Agree with meclizine - change to q6h 4) Lipid panel (goal LDL < 100), A1C (goal < 7.0) 5) Speech and swallow eval if not already done for swallowing safety 6) PT/OT 7) Cont. Asa 8) Stroke team will follow  Meko Masterson 06/06/2011, 11:35 AM

## 2011-06-06 NOTE — Progress Notes (Signed)
PROGRESS NOTE  [name] [name] GEX:[id number] DOB: 07/19/1945 DOA: 06/05/2011 PCP: [facility]  Brief narrative: 65 year old man presented to the emergency department with generalized weakness and fatigue that began 3 days prior to admission. Complained of nausea and his wife noted slurred speech. Possible left facial droop and numbness. Dizziness. Neurologic exam by the emergency department physician was notable for being nonfocal. Impression was vertigo and concern was possible stroke. CT scan suggested the possibility of a midbrain infarct and therefore the patient was admitted for further evaluation.  Past medical history: Stroke with residual left-sided weakness, coronary artery disease, status post heart catheterization with stent placement  Consultants:  Neurology  Physical therapy: Home health physical therapy  Procedures:  December 11: Bilateral carotid Dopplers: No significant ICA stenosis bilaterally. Vertebral arteries patent with antegrade flow.  December 11: Echocardiogram: Results pending  Interim History: Documentation reviewed. Neurology suspects brainstem stroke. Subjective: Vertigo continues.  Objective:  Filed Vitals:   06/06/11 0500 06/06/11 1045 06/06/11 1310 06/06/11 1400  BP: 100/60 107/75 109/76 109/72  Pulse: 60 69 72 71  Temp: 98.5 F (36.9 C) 97.3 F (36.3 C)  97.1 F (36.2 C)  TempSrc: Oral Oral  Oral  Resp: 18 18  18   Height: 5\' 9"  (1.753 m)     Weight: 103 kg (227 lb 1.2 oz)     SpO2: 94% 93% 94% 94%    Intake/Output Summary (Last 24 hours) at 06/06/11 1502 Last data filed at 06/06/11 1300  Gross per 24 hour  Intake    720 ml  Output      0 ml  Net    720 ml    Exam: General: Appears nontoxic. Mildly uncomfortable. Cardiovascular: Irregular rate and rhythm. No murmur, rub or gallop. No lower extremity edema. Respiratory: Clear to auscultation bilaterally. No wheezes, rales or rhonchi. Normal respiratory  effort. Psychiatric: Speech fluent and clear.  Data Reviewed: Basic Metabolic Panel:  Lab 06/06/11 0102 06/05/11 1741 06/05/11 1721  NA 142 143 139  K 3.9 3.7 --  CL 107 107 105  CO2 27 -- 26  GLUCOSE 120* 112* 111*  BUN 16 16 14   CREATININE 0.85 0.80 0.71  CALCIUM 9.1 -- 9.6  MG -- -- --  PHOS -- -- --   Liver Function Tests:  Lab 06/06/11 0618 06/05/11 1721  AST 15 16  ALT 16 16  ALKPHOS 79 87  BILITOT 0.5 0.4  PROT 6.9 7.5  ALBUMIN 3.5 3.8   CBC:  Lab 06/06/11 0618 06/05/11 1741 06/05/11 1721  WBC 5.3 -- 6.5  NEUTROABS -- -- 3.5  HGB 12.5* 12.2* 13.1  HCT 35.1* 36.0* 35.8*  MCV 86.2 -- 85.2  PLT 176 -- 193   Cardiac Enzymes:  Lab 06/06/11 0850 06/06/11 0155 06/05/11 1721  CKTOTAL 71 79 107  CKMB 1.9 2.1 2.5  CKMBINDEX -- -- --  TROPONINI <0.30 <0.30 <0.30   CBG:  Lab 06/05/11 1842  GLUCAP 97      Studies: Ct Head Wo Contrast  06/05/2011  *RADIOLOGY REPORT*  Clinical Data: Dizziness.  Left facial numbness.  Vertigo.  Nausea. Unsteady gait.  CT HEAD WITHOUT CONTRAST  Technique:  Contiguous axial images were obtained from the base of the skull through the vertex without contrast.  Comparison: None.  Findings: No intracranial hemorrhage.  Questionable acute infarct left paracentral mid brain?  No intracranial mass lesion detected on this unenhanced exam.  No hydrocephalus.  IMPRESSION: Question small acute non hemorrhagic left paracentral mid brain  infarct?  No intracranial hemorrhage.  Original Report Authenticated By: Fuller Canada, M.D.   Mr Angiogram Head Wo Contrast  06/06/2011  *RADIOLOGY REPORT*  Clinical Data:  Dizziness with left facial numbness and visual disturbance. History of coronary artery disease with recent stent placement.  History of hypertension and hyperlipidemia.  MRI HEAD WITHOUT CONTRAST MRA HEAD WITHOUT CONTRAST  Technique:  Multiplanar, multiecho pulse sequences of the brain and surrounding structures were obtained without  intravenous contrast. Angiographic images of the head were obtained using MRA technique without contrast.  Comparison:  CT head earlier in the day.  MRI HEAD  Findings:  The patient was moderately uncooperative and could not remain motionless for the study.  Images are suboptimal.  Small or subtle lesions could be overlooked.  There is no evidence for acute infarction, intracranial hemorrhage, mass lesion, hydrocephalus, or extra-axial fluid.  Mild atrophy is present.  There is mild chronic microvascular ischemic change.  No foci of chronic hemorrhage are seen.  The pituitary, pineal, and cerebellar tonsils are unremarkable.  There is mild chronic sinus disease without mastoid fluid. Bilateral cataract extraction has been performed.  IMPRESSION: Atrophy and small vessel disease.  No acute intracranial findings. There is no evidence for a midbrain infarct as questioned from CT.  MRA HEAD  Findings: Dolichoectatic vasculature reflects chronic hypertension. Widely patent internal carotid arteries from the skull base through their terminus.  Basilar artery is also widely patent with right greater than left vertebral contribution.  There is mild nonstenotic irregularity of the anterior, middle, and posterior cerebral arteries without flow reducing lesion.  There is no visible intracranial aneurysm.  Posterior fossa branches appear unremarkable.  IMPRESSION: No proximal stenosis.  Mild nonstenotic intracranial atherosclerotic change.  A preliminary report of these findings was generated for the ED.  Original Report Authenticated By: Elsie Stain, M.D.   Scheduled Meds:   . aspirin  81 mg Oral Daily  . clonazePAM  1 mg Oral QHS  . clopidogrel  75 mg Oral Daily  . LORazepam  1 mg Intravenous Once  . meclizine  25 mg Oral Once  . metoprolol tartrate  12.5 mg Oral BID  . ondansetron  4 mg Intravenous Once  . ranolazine  1,000 mg Oral BID  . rosuvastatin  10 mg Oral q1800  . DISCONTD: simvastatin  40 mg Oral  q1800   Continuous Infusions:   . sodium chloride 100 mL/hr at 06/06/11 0138     Assessment/Plan: 1. Presumed stroke, suspicious for brainstem stroke: Repeat MRI brain and MRA of the neck per neurology. Check lipid panel. Physical, occupational and speech therapy evaluation. 2. History of coronary artery disease with recent stent placement: Appears stable; continue Plavix and aspirin. Discontinue Ranexa per [employer] cardiologist.  Code Status: Full  Disposition Plan: Home when improved.   Brendia Sacks, MD  [facility] Pager [phone] 06/06/2011, 3:02 PM    LOS: 1 day

## 2011-06-06 NOTE — Progress Notes (Signed)
Occupational Therapy Evaluation Patient Details Name: [name] [name] MRN: [id number] DOB: 1946-04-19 Today's Date: 06/06/2011  Problem List:  Patient Active Problem List  Diagnoses  . CVA (cerebral infarction)  . CAD (coronary artery disease)    Past Medical History:  Past Medical History  Diagnosis Date  . Coronary artery disease   . Stroke    Past Surgical History:  Past Surgical History  Procedure Date  . Cardiac catheterization   . Coronary angioplasty     OT Assessment/Plan/Recommendation OT Assessment Clinical Impression Statement: Patient will benefit from skilled OT in the acute setting to maximize independence with ADL and ADL mobility to Mod I/I level upon d/c home OT Recommendation/Assessment: Patient will need skilled OT in the acute care venue OT Problem List: Decreased activity tolerance;Impaired balance (sitting and/or standing);Impaired vision/perception;Decreased knowledge of use of DME or AE;Decreased knowledge of precautions (dizziness) OT Therapy Diagnosis : Generalized weakness;Disturbance of vision OT Plan OT Frequency: Min 2X/week OT Treatment/Interventions: Self-care/ADL training;Therapeutic exercise;DME and/or AE instruction;Therapeutic activities;Patient/family education;Visual/perceptual remediation/compensation;Balance training OT Recommendation Follow Up Recommendations:  TBD- may need OPOT and/or neuro-ophthalmology appointment Equipment Recommended: TBD Individuals Consulted Consulted and Agree with Results and Recommendations: Patient;Family member/caregiver Family Member Consulted: wife OT Goals Acute Rehab OT Goals OT Goal Formulation: With patient/family Time For Goal Achievement: 7 days ADL Goals Pt Will Perform Grooming: Independently;Standing at sink ADL Goal: Grooming - Progress: Other (comment) Pt Will Perform Upper Body Dressing: with set-up;Sitting, bed;Unsupported ADL Goal: Upper Body Dressing - Progress: Other (comment) Pt  Will Perform Lower Body Dressing: with supervision;with set-up;Sit to stand from bed ADL Goal: Lower Body Dressing - Progress: Other (comment) Pt Will Transfer to Toilet: with modified independence;Ambulation;with DME;Regular height toilet ADL Goal: Toilet Transfer - Progress: Other (comment)  OT Evaluation Precautions/Restrictions  Precautions Precautions: Fall Restrictions Weight Bearing Restrictions: No Prior Functioning Home Living Lives With: Spouse Receives Help From: Family Type of Home: House Home Layout: One level Home Access: Stairs to enter Entrance Stairs-Rails: Can reach both Entrance Stairs-Number of Steps: 5 Home Adaptive Equipment: Walker - rolling;Straight cane Additional Comments: Pt not using device for last few weeks Prior Function Level of Independence: Independent with basic ADLs;Independent with gait;Independent with transfers Able to Take Stairs?: Yes Leisure: Hobbies-yes (Comment) Comments: Pt has been hunting this fall ADL ADL Eating/Feeding: Simulated;Set up;Supervision/safety Eating/Feeding Details (indicate cue type and reason): increased time secondary to patient's dizziness Where Assessed - Eating/Feeding: Edge of bed ADL Comments: Patient had just worked with PT and unable to tolerate any OOB activity at this time. Did assess vision- see below. Vision/Perception  Vision - History Baseline Vision: Wears glasses only for reading Visual History: Cataracts (with cataract surgery) Patient Visual Report: Diplopia;Eye fatigue/eye pain/headache;Unable to keep objects in focus;Nausea/blurring vision with head movement Vision - Assessment Additional Comments: Pt able to tolerate very little vision testing secondary to dizziness. Patient unable to track past midline to the left on command, did not turn head to left upon command or to loud noise. However, will intermittently/spontaneously move eyes and head past midline to left. No difficulty moving eyes  past midline to right. May have malalignment of left eye, difficult to asses secondary to ptosis. Will continue to assess vision and make recommendations as appropriate. No nystagmus noted. Perception Perception: Not tested Praxis Praxis: Not tested Cognition Cognition Arousal/Alertness: Awake/alert Overall Cognitive Status: Appears within functional limits for tasks assessed Orientation Level: Oriented X4 Sensation/Coordination Sensation Light Touch: Appears Intact Stereognosis: Not tested Hot/Cold: Not tested Proprioception: Not tested Coordination  Gross Motor Movements are Fluid and Coordinated: Yes Fine Motor Movements are Fluid and Coordinated: Not tested Extremity Assessment RUE Assessment RUE Assessment: Within Functional Limits LUE Assessment LUE Assessment:  (4+/5) Mobility  Supine to Sit: 5: Supervision;HOB elevated (Comment degrees);With rails (~30degrees) Supine to Sit Details (indicate cue type and reason): Cues for initiation only Sit to Supine - Left: 5: Supervision;With rail;HOB elevated (comment degrees) (~30degrees) End of Session OT - End of Session Activity Tolerance:  (Patient limited greatly by dizziness) Patient left: in bed;with call bell in reach;with family/visitor present Nurse Communication: Mobility status for transfers;Mobility status for ambulation General Behavior During Session: [facility] for tasks performed Cognition: [facility] for tasks performed   Crystale Giannattasio 06/06/2011, 3:45 PM

## 2011-06-06 NOTE — Progress Notes (Signed)
Utilization review complete 

## 2011-06-06 NOTE — Progress Notes (Signed)
Physical Therapy Evaluation Patient Details Name: [name] [name] MRN: [id number] DOB: 1945-07-20 Today's Date: 06/06/2011  Problem List:  Patient Active Problem List  Diagnoses  . CVA (cerebral infarction)  . CAD (coronary artery disease)    Past Medical History:  Past Medical History  Diagnosis Date  . Coronary artery disease   . Stroke    Past Surgical History:  Past Surgical History  Procedure Date  . Cardiac catheterization   . Coronary angioplasty     PT Assessment/Plan/Recommendation PT Assessment Clinical Impression Statement: Pt is 65 y.o male admitted for recent onset of dizziness, brought on with all position changes, especially turning head to R with accompanying L posterior neck pain. When asked to rate, pt reports 12 on scale of 0-10. Pt had a CVA during stent placement in 10/12, and near full recovery, able to ambulate with no device. Pt states that the dizziness reduces somewhat after several minutes in a position. Orthostatics checked, and negative. Pt reports no diplopio, but a "fog" over L eye and has difficulty keeping eyes open. OT noted difficulty tracking L past midline. Pt will benefit from vestibular eval. Dizziness limiting all functional mobility and balance at this time. Pt will benefit from skilled PT to address these impairments and return home safely with reduced falls risk and caregiver burden.  PT Recommendation/Assessment: Patient will need skilled PT in the acute care venue PT Problem List: Decreased strength;Decreased activity tolerance;Decreased balance;Decreased mobility;Decreased knowledge of use of DME;Decreased safety awareness;Decreased knowledge of precautions (Dizziness) PT Therapy Diagnosis : Abnormality of gait (Decreased functional mobility) PT Plan PT Frequency: Min 5X/week PT Treatment/Interventions: DME instruction;Gait training;Stair training;Functional mobility training;Therapeutic activities;Neuromuscular re-education;Balance  training;Therapeutic exercise;Patient/family education PT Recommendation Follow Up Recommendations: Home health PT;24 hour supervision/assistance Equipment Recommended: None recommended by PT PT Goals  Acute Rehab PT Goals PT Goal Formulation: With patient/family Time For Goal Achievement: 7 days Pt will go Supine/Side to Sit: with modified independence;with HOB 0 degrees PT Goal: Supine/Side to Sit - Progress: Progressing toward goal Pt will go Sit to Supine/Side: with modified independence;with HOB 0 degrees PT Goal: Sit to Supine/Side - Progress: Progressing toward goal Pt will go Sit to Stand: with supervision PT Goal: Sit to Stand - Progress: Progressing toward goal Pt will go Stand to Sit: with supervision PT Goal: Stand to Sit - Progress: Progressing toward goal Pt will Ambulate: 51 - 150 feet;with supervision;with least restrictive assistive device PT Goal: Ambulate - Progress: Progressing toward goal Pt will Go Up / Down Stairs: 3-5 stairs;with supervision;with rail(s) PT Goal: Up/Down Stairs - Progress: Not met  PT Evaluation Precautions/Restrictions  Precautions Precautions: Fall Restrictions Weight Bearing Restrictions: No Prior Functioning  Home Living Lives With: Spouse Receives Help From: Family Type of Home: House Home Layout: One level Home Access: Stairs to enter Entrance Stairs-Rails: Can reach both Entrance Stairs-Number of Steps: 5 Home Adaptive Equipment: Walker - rolling;Straight cane Additional Comments: Pt not using device for last few weeks Prior Function Level of Independence: Independent with basic ADLs;Independent with gait;Independent with transfers Able to Take Stairs?: Yes Leisure: Hobbies-yes (Comment) Comments: Pt has been hunting this fall Cognition Cognition Arousal/Alertness: Awake/alert Overall Cognitive Status: Appears within functional limits for tasks assessed Orientation Level: Oriented  X4 Sensation/Coordination Sensation Light Touch: Appears Intact Stereognosis: Not tested Hot/Cold: Not tested Proprioception: Not tested Coordination Gross Motor Movements are Fluid and Coordinated: Yes Fine Motor Movements are Fluid and Coordinated: Yes Extremity Assessment RUE Assessment RUE Assessment: Within Functional Limits LUE Assessment LUE Assessment:  Within Functional Limits RLE Assessment RLE Assessment: Within Functional Limits LLE Assessment LLE Assessment: Exceptions to [facility] (Grossly 4/5) Mobility (including Balance) Bed Mobility Bed Mobility: Yes Supine to Sit: 5: Supervision;HOB elevated (Comment degrees) (HOB 37deg for decreased dizziness) Supine to Sit Details (indicate cue type and reason): Cues for initiation only Transfers Transfers: Yes Sit to Stand: 4: Min assist;With upper extremity assist;From bed Sit to Stand Details (indicate cue type and reason): Cues for hand placement, pt tries to pull up on RW Stand to Sit: 5: Supervision;With upper extremity assist;To chair/3-in-1 Stand to Sit Details: Cues for hand placement and RW management with turns Ambulation/Gait Ambulation/Gait: Yes Ambulation/Gait Assistance: 4: Min assist Ambulation/Gait Assistance Details (indicate cue type and reason): Min A for steadying, cues for posture Ambulation Distance (Feet): 40 Feet Assistive device: Rolling walker Gait Pattern: Decreased stride length;Trunk flexed;Shuffle Gait velocity: NT, but decreased Stairs: No  Posture/Postural Control Posture/Postural Control: No significant limitations Balance Balance Assessed: No Orthostatic BPs  Supine 109/72  Sitting 112/76  Standing 131/84      End of Session PT - End of Session Equipment Utilized During Treatment: Gait belt Activity Tolerance: Patient tolerated treatment well (Limited by dizziness, agreeable to participate considering) Patient left: in chair;with call bell in reach;with family/visitor present Nurse  Communication: Mobility status for transfers General Behavior During Session: [facility] for tasks performed Cognition: [facility] for tasks performed  [facility] [place], [place]  [phone] 06/06/2011, 3:05 PM

## 2011-06-06 NOTE — Progress Notes (Signed)
  Echocardiogram 2D Echocardiogram has been performed.  Juanita Laster Killian Schwer, RDCS 06/06/2011, 11:24 AM

## 2011-06-07 NOTE — Progress Notes (Signed)
Physical Therapy Treatment Patient Details Name: [name] [name] MRN: [id number] DOB: 04-Feb-1946 Today's Date: 06/07/2011  PT Assessment/Plan  PT - Assessment/Plan Comments on Treatment Session: Patient with + BPPV.  Needs continued treatment with Outpt. PT being best option for f/u therapy for Vestibular Rehab. If no transport initially, HHPT for vestibular rehab would be ok.   PT Plan: Frequency needs to be updated;Discharge plan needs to be updated PT Frequency: Min 3X/week Follow Up Recommendations: Outpatient PT;24 hour supervision/assistance (Outpt. PT for vestibular Rehab ideal.) Equipment Recommended: Rolling walker with 5" wheels PT Goals  Acute Rehab PT Goals PT Goal Formulation: With patient PT Goal: Supine/Side to Sit - Progress: Progressing toward goal PT Goal: Sit to Supine/Side - Progress: Progressing toward goal PT Goal: Sit to Stand - Progress: Progressing toward goal PT Goal: Stand to Sit - Progress: Progressing toward goal PT Goal: Ambulate - Progress: Progressing toward goal PT Goal: Up/Down Stairs - Progress: Other (comment)  PT Treatment Precautions/Restrictions  Precautions Precautions: Fall Precaution Comments: Treated for left BPPV, suspect horizontal canal BPPV as well.   Required Braces or Orthoses: No Restrictions Weight Bearing Restrictions: No Mobility (including Balance) Bed Mobility Supine to Sit: 5: Supervision;HOB elevated (Comment degrees) (20 degrees) Supine to Sit Details (indicate cue type and reason): Patient + for L BPPV; Treated with canalith repositioning maneuver with effective rx. with pt. feeling no dizziness after treatment.  Suspect horizontal canal BPPV.  Will return tomorrow  Sit to Supine - Left: 5: Supervision;HOB elevated (comment degrees) (HOB elevated at 20 degrees) Transfers Sit to Stand: 5: Supervision;With upper extremity assist;From bed Sit to Stand Details (indicate cue type and reason): cues for hand placement, pt tries  to pull up on RW Stand to Sit: 5: Supervision;With upper extremity assist;To chair/3-in-1 Stand to Sit Details: Cues for hand placement and RW management with turns Ambulation/Gait Ambulation/Gait Assistance: 4: Min assist Ambulation/Gait Assistance Details (indicate cue type and reason): min A for steadying, cues for posture; right knee instability noted.   Ambulation Distance (Feet): 125 Feet Assistive device: Rolling walker Gait Pattern: Decreased stride length;Trunk flexed;Shuffle Gait velocity: Decreased  Stairs: No Wheelchair Mobility Wheelchair Mobility: No  Posture/Postural Control Posture/Postural Control: No significant limitations Balance Balance Assessed: No Exercise    End of Session PT - End of Session Equipment Utilized During Treatment: Gait belt Activity Tolerance: Patient tolerated treatment well Patient left: in bed;with call bell in reach;with family/visitor present Nurse Communication: Mobility status for ambulation General Behavior During Session: [facility] for tasks performed Cognition: [facility] for tasks performed  INGOLD,Geena Weinhold 06/07/2011, 1:58 PM [facility] [phone] (pager)

## 2011-06-07 NOTE — Plan of Care (Signed)
Problem: Phase III Progression Outcomes Goal: Able to participate in therapies Outcome: Progressing Recommend Outpt. PT for vestibular Rehab if family can transport.  If not, HHPT for vestibular rehab.  Continue PT .  Thanks.  [facility] [phone] (pager)

## 2011-06-07 NOTE — Progress Notes (Signed)
PROGRESS NOTE  [name] [name]:[id number] DOB: 1945-08-02 DOA: 06/05/2011 PCP: [facility]  Brief narrative: 65 year old man presented to the emergency department with generalized weakness and fatigue that began 3 days prior to admission. Complained of nausea and his wife noted slurred speech. Possible left facial droop and numbness. Dizziness. Neurologic exam by the emergency department physician was notable for being nonfocal. Impression was vertigo and concern was possible stroke. CT scan suggested the possibility of a midbrain infarct and therefore the patient was admitted for further evaluation.  Past medical history: Stroke with residual left-sided weakness, coronary artery disease, status post heart catheterization with stent placement  Consultants:  Neurology  Physical therapy: Home health physical therapy or outpatient if family can transport, rolling walker with 5 inch wheels, outpatient vestibular rehabilitation ideal.  Occupational Therapy: outpatient evaluation  Speech therapy: Home health recommended to provide strategies for higher level cognitive tasks and increase safety within the home.  Procedures:  December 11: Bilateral carotid Dopplers: No significant ICA stenosis bilaterally. Vertebral arteries patent with antegrade flow.  December 11: Echocardiogram: Left ventricular ejection fraction 50-55%. No cardiac source of embolism identified.  Interim History: Documentation reviewed.  Subjective: Vertigo continues. However he feels better. Slurred speech has resolved at this point. His wife. No slurred speech yesterday. She reports she also had slurred speech when he had his first stroke earlier this year. He still seems to be cognitively slowed.  Objective:  Filed Vitals:   06/07/11 0200 06/07/11 0500 06/07/11 0953 06/07/11 1342  BP: 95/57 125/77 115/75   Pulse: 70 67 73 84  Temp: 98.5 F (36.9 C) 98.3 F (36.8 C) 97.8 F (36.6 C)     TempSrc: Oral Oral Oral   Resp: 18 18 18    Height:      Weight:      SpO2: 92% 95% 94%     Intake/Output Summary (Last 24 hours) at 06/07/11 1431 Last data filed at 06/06/11 1700  Gross per 24 hour  Intake    360 ml  Output      0 ml  Net    360 ml    Exam: General: Appears nontoxic. Mildly uncomfortable. Cardiovascular: Irregular rate and rhythm. No murmur, rub or gallop. No lower extremity edema. Respiratory: Clear to auscultation bilaterally. No wheezes, rales or rhonchi. Normal respiratory effort. Psychiatric: Speech somewhat slow but fluent and clear. Musculoskeletal: Grossly normal tone and strength upper and lower extremities.  Telemetry: Sinus rhythm, unremarkable.  Data Reviewed: Basic Metabolic Panel:  Lab 06/06/11 8657 06/05/11 1741 06/05/11 1721  NA 142 143 139  K 3.9 3.7 --  CL 107 107 105  CO2 27 -- 26  GLUCOSE 120* 112* 111*  BUN 16 16 14   CREATININE 0.85 0.80 0.71  CALCIUM 9.1 -- 9.6  MG -- -- --  PHOS -- -- --   Liver Function Tests:  Lab 06/06/11 0618 06/05/11 1721  AST 15 16  ALT 16 16  ALKPHOS 79 87  BILITOT 0.5 0.4  PROT 6.9 7.5  ALBUMIN 3.5 3.8   CBC:  Lab 06/06/11 0618 06/05/11 1741 06/05/11 1721  WBC 5.3 -- 6.5  NEUTROABS -- -- 3.5  HGB 12.5* 12.2* 13.1  HCT 35.1* 36.0* 35.8*  MCV 86.2 -- 85.2  PLT 176 -- 193   Cardiac Enzymes:  Lab 06/06/11 1800 06/06/11 0850 06/06/11 0155 06/05/11 1721  CKTOTAL 61 71 79 107  CKMB 1.8 1.9 2.1 2.5  CKMBINDEX -- -- -- --  TROPONINI <0.30 <0.30 <0.30 <  0.30   CBG:  Lab 06/05/11 1842  GLUCAP 97   Studies: Dg Cervical Spine 2-3 Views  06/06/2011  *RADIOLOGY REPORT*  Clinical Data: Neck pain.  History of 2 recent strokes.  CERVICAL SPINE - 2-3 VIEW  Comparison: None.  Findings: Three views cervical spine exam is performed, showing normal alignment.  There is degenerative change, most notably at C3- 4, C4-5.  There is no evidence for acute fracture or dislocation. Lung apices are clear.  If  there is clinical suspicion for trauma, full C-spine series would be recommended.  IMPRESSION:  1.  Degenerative changes. 2. No evidence for acute  abnormality.  Original Report Authenticated By: Patterson Hammersmith, M.D.   Mr Angiogram Neck Wo Contrast  06/07/2011  *RADIOLOGY REPORT*  Clinical Data:  Dizziness  MRA NECK WITHOUT CONTRAST  Technique:  Angiographic images of the neck were obtained using MRA technique without intravenous contrast.  Carotid stenosis measurements (when applicable) are obtained utilizing NASCET criteria, using the distal internal carotid diameter as the denominator.  Comparison:  Prior MRI brain studies.  Findings:  Noncontrast examination results in suboptimal visualization of the extracranial vasculature.  Gross patency of the carotid and the vertebral arteries is established.  There is no definite flow reducing lesion at the carotid bifurcations.  IMPRESSION: Grossly negative examination.  Original Report Authenticated By: Elsie Stain, M.D.   Mr Brain Ltd W/o Cm  06/07/2011  *RADIOLOGY REPORT*  Clinical Data: Dizziness and left facial numbness.  MRI HEAD WITHOUT CONTRAST  Technique:  Multiplanar, multiecho pulse sequences of the brain and surrounding structures were obtained according to standard protocol without intravenous contrast.  Comparison: MRI brain 06/05/2011  Findings: The patient returned for thin section diffusion imaging through the brainstem.  There are no areas of restricted diffusion. No mass lesions are seen.  IMPRESSION: No visible brainstem infarction following repeat thin section DWI.  Original Report Authenticated By: Elsie Stain, M.D.   Scheduled Meds:    . aspirin  81 mg Oral Daily  . clonazePAM  1 mg Oral QHS  . clopidogrel  75 mg Oral Daily  . rosuvastatin  10 mg Oral q1800  . DISCONTD: metoprolol tartrate  12.5 mg Oral BID  . DISCONTD: ranolazine  1,000 mg Oral BID   Continuous Infusions:    . DISCONTD: sodium chloride 100 mL/hr at  06/06/11 0138     Assessment/Plan: 1. Episode of dysarthria, prolonged with associated vertigo: Repeat MRI of the brain with thin sections was negative. MRA of neck also grossly negative. LDL 108.   continue Crestor. Physical, occupational and speech therapy evaluations noted. 2. History of coronary artery disease with recent stent placement: Appears stable; continue Plavix and aspirin. Discontinue Ranexa per [employer] cardiologist.  Briefly discussed with Dr. Pearlean Brownie this morning awake stroke team evaluation and recommendations. Will patient's vertigo can be explained it is difficult to understand the etiology of his recurrent dysarthria. However there is no apparent activity to suggest seizure.  Discussed with wife at bedside.  Code Status: Full  Disposition Plan: Home when improved.   Brendia Sacks, MD  [facility] Pager [phone] 06/07/2011, 2:31 PM    LOS: 2 days

## 2011-06-07 NOTE — Progress Notes (Signed)
Occupational Therapy Treatment Patient Details Name: [name] [name] MRN: [id number] DOB: [birth year] Today's Date: 06/07/2011  OT Assessment/Plan OT Assessment/Plan Comments on Treatment Session: Pt. continues with diplopia with distant vision.  Pt. demonstrates divergence deficits - appears to have abducens weakness OS.  Pt. also noted with Lt. field deficit (inferior and superior quadrants) OS only.  Pt. reports he thinks this is from prior CVA, but is uncertain.  Will see tomorrow for HEP for vision, and recommend OP OT.  Spoke with pt. and wife re: outpatient OT/PT and they are agreeable.   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   Required Braces or Orthoses: No Restrictions Weight Bearing Restrictions: No   ADL ADL ADL Comments: Pt. had just finished working with PT with BPPV issues, and was instructed, by PT, to lie flat for specified time frame.  VIsion further assesed with pt. supine.  Pt. demonstrates Lt. field deficit OS only both inferior and superior quadrants.  Pt. reports that he was seeing a doctor at the [place] about his vision from the last CVA, and thinks this field loss is from the last CVA.  Occulomotor:  Pt. with significant difiiculty following directions for pursuits.  Will not abduct OS to follow object.  When questioned he reports that he can't see it - attempted multiple times with no success.  Pt. will spontaneusly look Lt. and Rt. eye movement lacks last 1/4 range, and pt. turns his head.  Again uncertain if this is due to loss of ROM, or pt. inability to understand the activity requested.  Unable to asses control of movement and ability to maintian fixation.  Pt. initially denies diplopia  today, but with further questioning and testiing, pt. with diplopia with distant vision.  Pt. with significant diffiulty with divergence resulting in diplopia.  Pt. wears reading glasses and does not have those present, therefore, could not asses ability to read.  vision assesment caused headache, and pt. is reporting frequent headaches.  Unable to provide HEP this date, as pt. needs to be upright and able to adjust distance from objects to work on divergence, and pt. currently instructed, by PT, to lie flat following treatment for BPPV.  Will see tomorrow for HEP End of Session OT - End of Session Activity Tolerance: Patient limited by fatigue;Patient limited by pain Patient left: in bed;with call bell in reach;with family/visitor present General Behavior During Session: [facility] for tasks performed Cognition: [facility] for tasks performed  Vola Beneke M  06/07/2011, 4:56 PM

## 2011-06-07 NOTE — Progress Notes (Signed)
Speech Language/Pathology Speech Language Pathology Evaluation Patient Details Name: [name] [name] MRN: [id number] DOB: [birth year] Today's Date: 06/07/2011  Problem List:  Patient Active Problem List  Diagnoses  . CVA (cerebral infarction)  . CAD (coronary artery disease)    Past Medical History:  Past Medical History  Diagnosis Date  . Coronary artery disease   . Stroke    Past Surgical History:  Past Surgical History  Procedure Date  . Cardiac catheterization   . 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 2012). Pt and wife report decline in rate of thinking and processing time. Pt displays deficits with memory retrieval, attention, and awareness which have been reported to have mild decline since current hospitalization per pt and wife. No acute SLP services needed secondary to pt at functional cognitive level and able to communicate and make needs known.  Pt would benefit from SLP Home Health follow-up to provide strategies for higher level cognitive tasks and increase pt's safety within the home.  SLP Recommendation/Assessment: All further Speech Lanaguage Pathology  needs can be addressed in the next venue of care Recommendation Individuals Consulted Consulted and Agree with Results and Recommendations: Patient;Family member/caregiver   Chyrel Masson, Speech Pathology Student 06/07/2011

## 2011-06-07 NOTE — Plan of Care (Signed)
Problem: Phase II Progression Outcomes Goal: Neuro status stablized/improved Outcome: Progressing Pt c/o no numbness/tingling. Still dizzy and presents left sided weakness and drift

## 2011-06-08 MED ORDER — MECLIZINE HCL 25 MG PO TABS: 25.0000 mg | ORAL_TABLET | Freq: Four times a day (QID) | ORAL | Status: AC | PRN

## 2011-06-08 NOTE — Discharge Summary (Signed)
Physician Discharge Summary  [name] [name] ZOX:[id number] DOB: 1946/04/26 DOA: 06/05/2011  PCP: [facility]  Admit date: 06/05/2011 Discharge date: 06/08/2011  Discharge Diagnoses:  1. Episode of dysarthria with prolonged associated vertigo, felt to be related to previous stroke. 2. History of coronary artery disease, stable  Discharge Condition: Improved.  Disposition: Home with home health physical therapy (vestibular rehabilitation) and occupational therapy.  History of present illness:  65 year old man presented to the emergency department with generalized weakness and fatigue that began 3 days prior to admission. Complained of nausea and his wife noted slurred speech. Possible left facial droop and numbness. Dizziness. Neurologic exam by the emergency department physician was notable for being nonfocal. Impression was vertigo and concern was possible stroke. CT scan suggested the possibility of a midbrain infarct and therefore the patient was admitted for further evaluation.  Hospital Course:  Patient was admitted to the medical floor. He seen in consultation with neurology. He had MRI of the brain which was unremarkable. Because of his history a brainstem infarct was suspected in the repeat MRI of the brain was ordered by neurology. However this too was unremarkable. He was seen by the stroke team today and cleared for discharge. The clinical impression at this point is that his symptoms are likely related to his previous stroke. He was specifically recommended he continue on aspirin and Plavix. He continues on Crestor. He will followup with neurology in 2 months. He was assessed by physical and occupational therapy and has been recommended he home health. In regard to his coronary artery disease this has been stable. His cardiologist at the [facility] recommend discontinuing his Ranexa.  1. History of coronary artery disease with recent stent placement:  Appears stable; continue Plavix and aspirin. Discontinue Ranexa per [employer] cardiologist.  Consultants:  Neurology: Followup with neurology in 2 months.   Physical therapy: Home health physical therapy for vestibular rehabilitation., rolling walker with 5 inch wheels.   Occupational Therapy: Home health occupational therapy. Tub bench.   Speech therapy: Home health recommended to provide strategies for higher level cognitive tasks and increase safety within the home.  Procedures:  December 11: Bilateral carotid Dopplers: No significant ICA stenosis bilaterally. Vertebral arteries patent with antegrade flow.   December 11: Echocardiogram: Left ventricular ejection fraction 50-55%. No cardiac source of embolism identified.  Discharge Instructions   Current Discharge Medication List    START taking these medications   Details  meclizine (ANTIVERT) 25 MG tablet Take 1 tablet (25 mg total) by mouth every 6 (six) hours as needed for dizziness or nausea. Qty: 20 tablet, Refills: 0      CONTINUE these medications which have NOT CHANGED   Details  aspirin 81 MG tablet Take 81 mg by mouth daily.      clonazePAM (KLONOPIN) 1 MG tablet Take 1 mg by mouth at bedtime.      clopidogrel (PLAVIX) 75 MG tablet Take 75 mg by mouth daily.      metoprolol tartrate (LOPRESSOR) 25 MG tablet Take 12.5 mg by mouth 2 (two) times daily.      ondansetron (ZOFRAN) 8 MG tablet Take 8 mg by mouth every 8 (eight) hours as needed. For nausea      pravastatin (PRAVACHOL) 80 MG tablet Take 80 mg by mouth at bedtime.      nitroGLYCERIN (NITROSTAT) 0.4 MG SL tablet Place 0.4 mg under the tongue every 5 (five) minutes as needed. For chest pain  STOP taking these medications     ranolazine (RANEXA) 500 MG 12 hr tablet        Follow-up Information    Follow up with Gates Rigg, MD. Make an appointment in 2 months.   Contact information:   [address], [address] [facility] [place] [address] [phone]           The results of significant diagnostics from this hospitalization (including imaging, microbiology, ancillary and laboratory) are listed below for reference.    Significant Diagnostic Studies: Dg Chest 2 View  06/06/2011  *RADIOLOGY REPORT*  Clinical Data: Stroke.  Light chest pains.  CHEST - 2 VIEW  Comparison: 04/18/2011  Findings: The heart is mildly enlarged.  The lungs are free of focal consolidations and pleural effusions.  No pulmonary edema. Overall, aeration of the bases is improved compared to prior study.  IMPRESSION: Cardiomegaly without pulmonary edema.  Original Report Authenticated By: Patterson Hammersmith, M.D.   Dg Cervical Spine 2-3 Views  06/06/2011  *RADIOLOGY REPORT*  Clinical Data: Neck pain.  History of 2 recent strokes.  CERVICAL SPINE - 2-3 VIEW  Comparison: None.  Findings: Three views cervical spine exam is performed, showing normal alignment.  There is degenerative change, most notably at C3- 4, C4-5.  There is no evidence for acute fracture or dislocation. Lung apices are clear.  If there is clinical suspicion for trauma, full C-spine series would be recommended.  IMPRESSION:  1.  Degenerative changes. 2. No evidence for acute  abnormality.  Original Report Authenticated By: Patterson Hammersmith, M.D.   Ct Head Wo Contrast  06/05/2011  *RADIOLOGY REPORT*  Clinical Data: Dizziness.  Left facial numbness.  Vertigo.  Nausea. Unsteady gait.  CT HEAD WITHOUT CONTRAST  Technique:  Contiguous axial images were obtained from the base of the skull through the vertex without contrast.  Comparison: None.  Findings: No intracranial hemorrhage.  Questionable acute infarct left paracentral mid brain?  No intracranial mass lesion detected on this unenhanced exam.  No hydrocephalus.  IMPRESSION: Question small acute non hemorrhagic left paracentral mid brain infarct?  No intracranial hemorrhage.  Original  Report Authenticated By: Fuller Canada, M.D.   Mr Angiogram Head Wo Contrast  06/06/2011  *RADIOLOGY REPORT*  Clinical Data:  Dizziness with left facial numbness and visual disturbance. History of coronary artery disease with recent stent placement.  History of hypertension and hyperlipidemia.  MRI HEAD WITHOUT CONTRAST MRA HEAD WITHOUT CONTRAST  Technique:  Multiplanar, multiecho pulse sequences of the brain and surrounding structures were obtained without intravenous contrast. Angiographic images of the head were obtained using MRA technique without contrast.  Comparison:  CT head earlier in the day.  MRI HEAD  Findings:  The patient was moderately uncooperative and could not remain motionless for the study.  Images are suboptimal.  Small or subtle lesions could be overlooked.  There is no evidence for acute infarction, intracranial hemorrhage, mass lesion, hydrocephalus, or extra-axial fluid.  Mild atrophy is present.  There is mild chronic microvascular ischemic change.  No foci of chronic hemorrhage are seen.  The pituitary, pineal, and cerebellar tonsils are unremarkable.  There is mild chronic sinus disease without mastoid fluid. Bilateral cataract extraction has been performed.  IMPRESSION: Atrophy and small vessel disease.  No acute intracranial findings. There is no evidence for a midbrain infarct as questioned from CT.  MRA HEAD  Findings: Dolichoectatic vasculature reflects chronic hypertension. Widely patent internal carotid arteries from the skull base  through their terminus.  Basilar artery is also widely patent with right greater than left vertebral contribution.  There is mild nonstenotic irregularity of the anterior, middle, and posterior cerebral arteries without flow reducing lesion.  There is no visible intracranial aneurysm.  Posterior fossa branches appear unremarkable.  IMPRESSION: No proximal stenosis.  Mild nonstenotic intracranial atherosclerotic change.  A preliminary report of  these findings was generated for the ED.  Original Report Authenticated By: Elsie Stain, M.D.   Mr Angiogram Neck Wo Contrast  06/07/2011  *RADIOLOGY REPORT*  Clinical Data:  Dizziness  MRA NECK WITHOUT CONTRAST  Technique:  Angiographic images of the neck were obtained using MRA technique without intravenous contrast.  Carotid stenosis measurements (when applicable) are obtained utilizing NASCET criteria, using the distal internal carotid diameter as the denominator.  Comparison:  Prior MRI brain studies.  Findings:  Noncontrast examination results in suboptimal visualization of the extracranial vasculature.  Gross patency of the carotid and the vertebral arteries is established.  There is no definite flow reducing lesion at the carotid bifurcations.  IMPRESSION: Grossly negative examination.  Original Report Authenticated By: Elsie Stain, M.D.   Mr Brain Ltd W/o Cm  06/07/2011  *RADIOLOGY REPORT*  Clinical Data: Dizziness and left facial numbness.  MRI HEAD WITHOUT CONTRAST  Technique:  Multiplanar, multiecho pulse sequences of the brain and surrounding structures were obtained according to standard protocol without intravenous contrast.  Comparison: MRI brain 06/05/2011  Findings: The patient returned for thin section diffusion imaging through the brainstem.  There are no areas of restricted diffusion. No mass lesions are seen.  IMPRESSION: No visible brainstem infarction following repeat thin section DWI.  Original Report Authenticated By: Elsie Stain, M.D.   Labs: Basic Metabolic Panel:  Lab 06/06/11 6295 06/05/11 1741 06/05/11 1721  NA 142 143 139  K 3.9 3.7 --  CL 107 107 105  CO2 27 -- 26  GLUCOSE 120* 112* 111*  BUN 16 16 14   CREATININE 0.85 0.80 0.71  CALCIUM 9.1 -- 9.6  MG -- -- --  PHOS -- -- --   Liver Function Tests:  Lab 06/06/11 0618 06/05/11 1721  AST 15 16  ALT 16 16  ALKPHOS 79 87  BILITOT 0.5 0.4  PROT 6.9 7.5  ALBUMIN 3.5 3.8   CBC:  Lab 06/06/11  0618 06/05/11 1741 06/05/11 1721  WBC 5.3 -- 6.5  NEUTROABS -- -- 3.5  HGB 12.5* 12.2* 13.1  HCT 35.1* 36.0* 35.8*  MCV 86.2 -- 85.2  PLT 176 -- 193   Cardiac Enzymes:  Lab 06/06/11 1800 06/06/11 0850 06/06/11 0155 06/05/11 1721  CKTOTAL 61 71 79 107  CKMB 1.8 1.9 2.1 2.5  CKMBINDEX -- -- -- --  TROPONINI <0.30 <0.30 <0.30 <0.30   BNP: No components found with this basename: POCBNP:5 CBG:  Lab 06/05/11 1842  GLUCAP 97    Time coordinating discharge: 35 minutes.  Signed:  Brendia Sacks, MD  [facility] 06/08/2011, 3:46 PM

## 2011-06-08 NOTE — Progress Notes (Signed)
PROGRESS NOTE  [name] [name] ZOX:[id number] DOB: 03-27-1946 DOA: 06/05/2011 PCP: [facility]  Brief narrative: 66 year old man presented to the emergency department with generalized weakness and fatigue that began 3 days prior to admission. Complained of nausea and his wife noted slurred speech. Possible left facial droop and numbness. Dizziness. Neurologic exam by the emergency department physician was notable for being nonfocal. Impression was vertigo and concern was possible stroke. CT scan suggested the possibility of a midbrain infarct and therefore the patient was admitted for further evaluation.  Past medical history: Stroke with residual left-sided weakness, coronary artery disease, status post heart catheterization with stent placement  Consultants:  Neurology: Followup with neurology in 2 months.  Physical therapy: Home health physical therapy for vestibular rehabilitation., rolling walker with 5 inch wheels.  Occupational Therapy: Outpatient occupational therapy.  Speech therapy: Home health recommended to provide strategies for higher level cognitive tasks and increase safety within the home.  Procedures:  December 11: Bilateral carotid Dopplers: No significant ICA stenosis bilaterally. Vertebral arteries patent with antegrade flow.  December 11: Echocardiogram: Left ventricular ejection fraction 50-55%. No cardiac source of embolism identified.  Interim History: Documentation reviewed. Cleared by neurology for discharge. Subjective: Feels much better. Ready for discharge.  Objective:  Filed Vitals:   06/07/11 2215 06/08/11 0615 06/08/11 0954 06/08/11 1332  BP: 115/70 120/62 122/85 103/69  Pulse: 83 64 95 79  Temp: 97.5 F (36.4 C) 97.7 F (36.5 C) 98.5 F (36.9 C) 97.2 F (36.2 C)  TempSrc: Oral Oral Oral Oral  Resp: 18 20 20 18   Height:      Weight:      SpO2: 93% 94% 96% 95%    Intake/Output Summary (Last 24 hours) at 06/08/11  1506 Last data filed at 06/08/11 1300  Gross per 24 hour  Intake    360 ml  Output      0 ml  Net    360 ml    Exam: General: Appears well. Cardiovascular: Regular rate and rhythm. No murmur, rub or gallop. No lower extremity edema. Respiratory: Clear to auscultation bilaterally. No wheezes, rales or rhonchi. Normal respiratory effort.  Data Reviewed: Basic Metabolic Panel:  Lab 06/06/11 8119 06/05/11 1741 06/05/11 1721  NA 142 143 139  K 3.9 3.7 --  CL 107 107 105  CO2 27 -- 26  GLUCOSE 120* 112* 111*  BUN 16 16 14   CREATININE 0.85 0.80 0.71  CALCIUM 9.1 -- 9.6  MG -- -- --  PHOS -- -- --   Liver Function Tests:  Lab 06/06/11 0618 06/05/11 1721  AST 15 16  ALT 16 16  ALKPHOS 79 87  BILITOT 0.5 0.4  PROT 6.9 7.5  ALBUMIN 3.5 3.8   CBC:  Lab 06/06/11 0618 06/05/11 1741 06/05/11 1721  WBC 5.3 -- 6.5  NEUTROABS -- -- 3.5  HGB 12.5* 12.2* 13.1  HCT 35.1* 36.0* 35.8*  MCV 86.2 -- 85.2  PLT 176 -- 193   Cardiac Enzymes:  Lab 06/06/11 1800 06/06/11 0850 06/06/11 0155 06/05/11 1721  CKTOTAL 61 71 79 107  CKMB 1.8 1.9 2.1 2.5  CKMBINDEX -- -- -- --  TROPONINI <0.30 <0.30 <0.30 <0.30   CBG:  Lab 06/05/11 1842  GLUCAP 97   Scheduled Meds:    . aspirin  81 mg Oral Daily  . clonazePAM  1 mg Oral QHS  . clopidogrel  75 mg Oral Daily  . rosuvastatin  10 mg Oral q1800   Continuous Infusions:  Assessment/Plan: 1. Episode of dysarthria, prolonged with associated vertigo: Repeat MRI of the brain with thin sections was negative. MRA of neck also grossly negative. LDL 108.   Continue Crestor, aspirin and Plavix. Physical, occupational and speech therapy evaluations noted. 2. History of coronary artery disease with recent stent placement: Appears stable; continue Plavix and aspirin. Discontinue Ranexa per [employer] cardiologist.  Discussed with wife at bedside.  Code Status: Full  Disposition Plan: Home today with home health physical  (vestibular rehabilitation) and occupational therapy.   Brendia Sacks, MD  [facility] Pager [phone] 06/08/2011, 3:06 PM    LOS: 3 days

## 2011-06-08 NOTE — Progress Notes (Signed)
   CARE MANAGEMENT NOTE 06/08/2011  Patient:  [name] [name], [name] [name]   Account Number:  [id number]  Date Initiated:  06/06/2011  Documentation initiated by:  [facility]  Subjective/Objective Assessment:   stroke     Action/Plan:   lives at home with wife  PT/OT evals-PT recommedning HHPT, OT outpt OT   Anticipated DC Date:  06/08/2011   Anticipated DC Plan:  HOME W HOME HEALTH SERVICES      DC Planning Services  CM consult      Choice offered to / List presented to:  C-1 Patient        HH arranged  HH-2 PT  HH-3 OT  HH-5 SPEECH THERAPY      HH agency  [facility]   Status of service:  Completed, signed off Medicare Important Message given?   (If response is "NO", the following Medicare IM given date fields will be blank) Date Medicare IM given:   Date Additional Medicare IM given:    Discharge Disposition:  HOME W HOME HEALTH SERVICES  Per UR Regulation:  Reviewed for med. necessity/level of care/duration of stay  Comments:  06/06/2011 1500 Utilization review complete. Isidoro Donning RN CCM Case Mgmt phone [phone]  06/08/11 Unable to speak with SW at [place] despite numerous attempts and leaving VM messages.Orders received for HHPT OT and ST. Contacted Debbie at [place] and they will service the patient due to having recently worked with patient.  Patient already has a rolling walker at  home. Copy of H and P,facesheet, oders, face to face and d/c summary given to [facility]. Jacquelynn Cree RN, BSN, CCM  06/08/11 Spoke with patient and his wife about d/c plans. PT recommending HHPT, they are agreeable to [facility]. Patient has had [facility] in the past and would like to work with them again. [facility] [place] Dr. Tamera Punt. Patient also has Medicare Part A. Contacted Levander Campion SW at [place] [phone] and left VM. Contacted Debbie at [place] and requested HHPT and OT. awaiting orders and call back from SW at the [place]. Jacquelynn Cree RN, BSN, CCM

## 2011-06-08 NOTE — Progress Notes (Signed)
Utilization review complete 

## 2011-06-08 NOTE — Progress Notes (Signed)
Physical Therapy Treatment Patient Details Name: [name] [name] MRN: [id number] DOB: Oct 24, 1945 Today's Date: 06/08/2011  PT Assessment/Plan  PT - Assessment/Plan Comments on Treatment Session: Per last PT notes I tested the horizontal canals.  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  The left side was more intese than the right, so I treated on the left with the BBQ roll.  The patient felt significant relief and even some increased sharpness in his long distance vision after the treatment.  I advised him not to lie on his left side when sleeping until the home therapist has retested all of his canals.   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   Required Braces or Orthoses: No Restrictions Weight Bearing Restrictions: No Mobility (including Balance) Bed Mobility Bed Mobility: No Rolling Right: 7: Independent Rolling Left: 7: Independent Supine to Sit: 7: Independent Sit to Supine - Left: 7: Independent Transfers Sit to Stand: 5: Supervision Sit to Stand Details (indicate cue type and reason): supervision for safety Stand to Sit: 5: Supervision Stand to Sit Details: supervision for safety Ambulation/Gait Ambulation/Gait Assistance: 5: Supervision Ambulation/Gait Assistance Details (indicate cue type and reason): supervison for safety Ambulation Distance (Feet): 300 Feet  (150' x2.  Once before and once after vestibular treatement) Assistive device: Rolling walker Gait Pattern: Step-through pattern Stairs: Yes Stairs Assistance: 5: Supervision Stairs Assistance Details (indicate cue type and reason): supervision for safety since it was his first time trying the stairs since he has been hospitalized Stair Management Technique: Two rails;Step to pattern;Forwards Number of Stairs: 10  Height of Stairs:  (4- 6", 6-4" )    Exercise    End of Session PT - End of Session Equipment Utilized During Treatment: Gait belt (RW) Activity Tolerance: Patient tolerated treatment well Patient left: in bed;with call bell in reach;with family/visitor present (wife at bedside during treatment) General Behavior During Session: [facility] for tasks performed Cognition: [facility] for tasks performed  Hanh Kertesz B. Azaliyah Kennard, PT, DPT [phone]   06/08/2011, 11:19 AM

## 2011-06-08 NOTE — Progress Notes (Signed)
Occupational Therapy Treatment Patient Details Name: [name] [name] MRN: [id number] DOB: [birth year] Today's Date: 06/08/2011  OT Assessment/Plan OT Assessment/Plan Comments on Treatment Session: Patient now reporting convergence as well as divergence difficulties. Provided Luvenia Redden which will address both issues.  OT Plan: Discharge plan needs to be updated (patient requesting HHOT as opposed to outpatient) Follow Up Recommendations: Home health OT Equipment Recommended: Tub/shower seat OT Goals ADL Goals ADL Goal: Lower Body Dressing - Progress: Met ADL Goal: Additional Goal #1 - Progress: Progressing toward goals ADL Goal: Additional Goal #2 - Progress: Met  OT Treatment Precautions/Restrictions  Precautions Precautions: Fall   ADL ADL Lower Body Dressing: Simulated (min guard assist ) Where Assessed - Lower Body Dressing: Sit to stand from bed Tub/Shower Transfer: Simulated;Minimal assistance (walk-in shower) Tub/Shower Transfer Details (indicate cue type and reason): assist to maintain balance Tub/Shower Transfer Method: Ambulating ADL Comments: Provided patient with [place]ab[place] string and educated he and his wife on exercises- only able to tolerate for about before requiring rest break secondary to HA and eye fatigue. Also, educated patient and wife on beach ball exercises to increase compensation with left field deficit. Patient and wife verbalized understanding of how to nasally tape left lense of safety glasses once home.  Mobility  Transfers Sit to Stand: 5: Supervision;From bed Sit to Stand Details (indicate cue type and reason): for balance as patient swayed forward and backward secondary to dizziness Stand to Sit: 5: Supervision Stand to Sit Details: for safety End of Session OT - End of Session Equipment Utilized During Treatment: Gait belt Activity Tolerance: Patient limited by fatigue (eye fatigue) Patient left: in bed;with call bell in reach;with  family/visitor present General Behavior During Session: [facility] for tasks performed Cognition: [facility] for tasks performed  Shawntell Dixson  06/08/2011, 3:16 PM

## 2011-06-08 NOTE — Progress Notes (Signed)
Occupational Therapy Treatment Patient Details Name: [name] [name] MRN: [id number] DOB: 10-29-1945 Today's Date: 06/08/2011  OT Assessment/Plan OT Assessment/Plan OT Plan: Discharge plan remains appropriate Follow Up Recommendations: Outpatient OT Equipment Recommended: None recommended by OT OT Goals ADL Goals Additional ADL Goal #1: Patient will verbalize/demonstrate how to tape glasses to decrease diplopia ADL Goal: Additional Goal #1 - Progress: Progressing toward goals Additional ADL Goal #2: Patient and wife will be independent with vision HEP ADL Goal: Additional Goal #2 - Progress: Not met  OT Treatment Precautions/Restrictions  Precautions Precautions: Fall Restrictions Weight Bearing Restrictions: No   ADL ADL Comments: Patient states his dizziness is much improved today but double vision persists. Worked with patient, taping safety glasses to help decrease double vision. Concluded that taping left lense nasally most beneficial to patient. Left glasses with patient to wear this am and will check back with him around lunch. Only concern with taping left nasally is patient has left field deficit beginning approximately at midline and my goal is to not complete occlude left eye as I would like to keep eyes trying to work conjugately. patient will benefit from divergence exercises which I will go over next session. Mobility  Bed Mobility Bed Mobility: No Transfers Transfers: No End of Session OT - End of Session Equipment Utilized During Treatment:  (taped glasses) Activity Tolerance: Patient tolerated treatment well (c/o HA beginning at end of session) Patient left: in bed;with call bell in reach;with family/visitor present General Behavior During Session: [facility] for tasks performed Cognition: [facility] for tasks performed  Mykai Wendorf  06/08/2011, 10:34 AM

## 2011-06-08 NOTE — Progress Notes (Signed)
Stroke Team Progress Note  SUBJECTIVE  [name] [name] is a 65 y.o. male whose stroke symptoms occurred 2 days ago following a cardiac stent. He presented with symptoms of numbness or tingling of left face and droopy left eye lid "like i couldn't keep it up". He had what appeared to be a right brain subcortical infarct in October this year and has had residual mild left-sided weakness and numbness from that. His wife is at the bedside. Overall he feels his condition is stable, but concerned with his ability to walk.he has had MRI scan of the brain yesterday along with a repeat diffusion-weighted imaging with thin sections both of which have been negative for acute stroke.  OBJECTIVE Most recent Vital Signs: Temp: 98.5 F (36.9 C) (12/13 0954) Temp src: Oral (12/13 0954) BP: 122/85 mmHg (12/13 0954) Pulse Rate: 95  (12/13 0954) Respiratory Rate: 20 O2 Saturdation: 96%  CBG (last 3)   Basename 06/05/11 1842  GLUCAP 97   Diet:  Cardiac thin liquids Activity:  Up with assistance DVT Prophylaxis:  SCDs   Studies: CBC    Component Value Date/Time   WBC 5.3 06/06/2011 0618   RBC 4.07* 06/06/2011 0618   HGB 12.5* 06/06/2011 0618   HCT 35.1* 06/06/2011 0618   PLT 176 06/06/2011 0618   MCV 86.2 06/06/2011 0618   MCH 30.7 06/06/2011 0618   MCHC 35.6 06/06/2011 0618   RDW 12.6 06/06/2011 0618   LYMPHSABS 2.3 06/05/2011 1721   MONOABS 0.6 06/05/2011 1721   EOSABS 0.1 06/05/2011 1721   BASOSABS 0.0 06/05/2011 1721   CMP    Component Value Date/Time   NA 142 06/06/2011 0618   K 3.9 06/06/2011 0618   CL 107 06/06/2011 0618   CO2 27 06/06/2011 0618   GLUCOSE 120* 06/06/2011 0618   BUN 16 06/06/2011 0618   CREATININE 0.85 06/06/2011 0618   CALCIUM 9.1 06/06/2011 0618   PROT 6.9 06/06/2011 0618   ALBUMIN 3.5 06/06/2011 0618   AST 15 06/06/2011 0618   ALT 16 06/06/2011 0618   ALKPHOS 79 06/06/2011 0618   BILITOT 0.5 06/06/2011 0618   GFRNONAA 88* 06/06/2011 0618   GFRAA >90  06/06/2011 0618   COAGS Lab Results  Component Value Date   INR 0.98 06/05/2011   INR 0.99 04/17/2011   Lipid Panel    Component Value Date/Time   CHOL 183 06/06/2011 0618   TRIG 170* 06/06/2011 0618   HDL 41 06/06/2011 0618   CHOLHDL 4.5 06/06/2011 0618   VLDL 34 06/06/2011 0618   LDLCALC 108* 06/06/2011 0618   HgbA1C  Lab Results  Component Value Date   HGBA1C 5.8* 06/06/2011   Urine Drug Screen  No results found for this basename: labopia, cocainscrnur, labbenz, amphetmu, thcu, labbarb    Alcohol Level No results found for this basename: eth    Cervical Spine   1.  Degenerative changes. 2. No evidence for acute  abnormality.    MRI of the brain  No acute stroke, including thin cut MRI of brainstem  MRA of the neck grossly normal  MRA of the brain  No proximal stenosis. Mild nonstenotic intracranial atherosclerotic change.  2D Echocardiogram  EF 50-55% with no source of embolus.   Carotid Doppler  No internal carotid artery stenosis bilaterally. Vertebrals with antegrade flow bilaterally.   CXR  Cardiomegaly without pulmonary edema.  EKG  normal sinus rhythm, incomplete RBBB.   Physical Exam   Pleasant elderly Caucasian male currently not in distress.  Afebrile. Head is nontraumatic without bruit. Cardiac exam regular heart sounds or murmur or gallop. Lungs clear to auscultation. Neurological Exam : Awake alert oriented x 3 normal speech and language. Mild left lower face asymmetry. Tongue midline. No drift. Mild diminished fine finger movements on left. Orbits right over left upper extremity. Mild left grip weak.. Diminished left hemibody sensation . Normal coordination. Gait deferred. ASSESSMENT [name] [name] is a 65 y.o. male with worsening of prior stroke symptoms, no acute stroke. Etiology of worsening unclear, often cannot pinpoint precipitating event. Needs therapy evaluations in order to determine discharge disposition and needs. Dr. Pearlean Brownie discussed  with patient and his wife. On aspirin 81 mg orally every day and clopidogrel 75 mg orally every day for secondary stroke prevention.  Stroke risk factors:  CAD, recent cardiac stent and stroke 10/12  Hospital day # 3  TREATMENT/PLAN Continue aspirin 81 mg orally every day and clopidogrel 75 mg orally every day for secondary stroke prevention. Therapy evaluations for disposition and recommendations. Will sign off. Follow up with Dr. Pearlean Brownie in 2 months.   Joaquin Music, ANP-BC, GNP-BC [facility] Pager: [phone] 06/08/2011 10:10 AM  Dr. Delia Heady, Stroke Center Medical Director, has personally reviewed chart, pertinent data, examined the patient and developed the plan of care.

## 2011-11-20 ENCOUNTER — Inpatient Hospital Stay (HOSPITAL_COMMUNITY)
Admission: EM | Admit: 2011-11-20 | Discharge: 2011-11-23 | DRG: 313 | Disposition: A | Discharge status: Home / Self Care | Payer: Non-veteran care | Source: Ambulatory Visit | Attending: Internal Medicine | Admitting: Internal Medicine

## 2011-11-20 ENCOUNTER — Emergency Department (HOSPITAL_COMMUNITY): Payer: Non-veteran care

## 2011-11-20 DIAGNOSIS — R42 Dizziness and giddiness: Secondary | ICD-10-CM | POA: Diagnosis present

## 2011-11-20 DIAGNOSIS — G450 Vertebro-basilar artery syndrome: Secondary | ICD-10-CM | POA: Diagnosis present

## 2011-11-20 DIAGNOSIS — Z7902 Long term (current) use of antithrombotics/antiplatelets: Secondary | ICD-10-CM

## 2011-11-20 DIAGNOSIS — I639 Cerebral infarction, unspecified: Secondary | ICD-10-CM | POA: Diagnosis present

## 2011-11-20 DIAGNOSIS — F329 Major depressive disorder, single episode, unspecified: Secondary | ICD-10-CM | POA: Diagnosis present

## 2011-11-20 DIAGNOSIS — D649 Anemia, unspecified: Secondary | ICD-10-CM | POA: Diagnosis present

## 2011-11-20 DIAGNOSIS — G459 Transient cerebral ischemic attack, unspecified: Secondary | ICD-10-CM | POA: Diagnosis present

## 2011-11-20 DIAGNOSIS — R3129 Other microscopic hematuria: Secondary | ICD-10-CM | POA: Diagnosis present

## 2011-11-20 DIAGNOSIS — Z7982 Long term (current) use of aspirin: Secondary | ICD-10-CM

## 2011-11-20 DIAGNOSIS — R0789 Other chest pain: Principal | ICD-10-CM | POA: Diagnosis present

## 2011-11-20 DIAGNOSIS — R4182 Altered mental status, unspecified: Secondary | ICD-10-CM

## 2011-11-20 DIAGNOSIS — D696 Thrombocytopenia, unspecified: Secondary | ICD-10-CM | POA: Diagnosis present

## 2011-11-20 DIAGNOSIS — Z888 Allergy status to other drugs, medicaments and biological substances status: Secondary | ICD-10-CM

## 2011-11-20 DIAGNOSIS — Z9861 Coronary angioplasty status: Secondary | ICD-10-CM

## 2011-11-20 DIAGNOSIS — E869 Volume depletion, unspecified: Secondary | ICD-10-CM | POA: Diagnosis present

## 2011-11-20 DIAGNOSIS — I2510 Atherosclerotic heart disease of native coronary artery without angina pectoris: Secondary | ICD-10-CM | POA: Diagnosis present

## 2011-11-20 DIAGNOSIS — Z79899 Other long term (current) drug therapy: Secondary | ICD-10-CM

## 2011-11-20 DIAGNOSIS — I69959 Hemiplegia and hemiparesis following unspecified cerebrovascular disease affecting unspecified side: Secondary | ICD-10-CM

## 2011-11-20 DIAGNOSIS — R4701 Aphasia: Secondary | ICD-10-CM | POA: Diagnosis present

## 2011-11-20 DIAGNOSIS — F3289 Other specified depressive episodes: Secondary | ICD-10-CM | POA: Diagnosis present

## 2011-11-20 DIAGNOSIS — G9341 Metabolic encephalopathy: Secondary | ICD-10-CM | POA: Diagnosis present

## 2011-11-20 DIAGNOSIS — G9340 Encephalopathy, unspecified: Secondary | ICD-10-CM

## 2011-11-20 HISTORY — DX: Depression, unspecified: F32.A

## 2011-11-20 HISTORY — DX: Major depressive disorder, single episode, unspecified: F32.9

## 2011-11-20 HISTORY — DX: Anxiety disorder, unspecified: F41.9

## 2011-11-20 HISTORY — DX: Pneumonia, unspecified organism: J18.9

## 2011-11-20 HISTORY — DX: Inflammatory liver disease, unspecified: K75.9

## 2011-11-20 LAB — DIFFERENTIAL
Eosinophils Absolute: 0.2 10*3/uL (ref 0.0–0.7)
Eosinophils Relative: 2 % (ref 0–5)
Lymphocytes Relative: 39 % (ref 12–46)
Lymphs Abs: 2.7 10*3/uL (ref 0.7–4.0)
Monocytes Absolute: 0.5 10*3/uL (ref 0.1–1.0)
Monocytes Relative: 7 % (ref 3–12)

## 2011-11-20 LAB — CBC
HCT: 34.3 % — ABNORMAL LOW (ref 39.0–52.0)
MCH: 30.6 pg (ref 26.0–34.0)
MCV: 84.7 fL (ref 78.0–100.0)
RBC: 4.05 MIL/uL — ABNORMAL LOW (ref 4.22–5.81)
WBC: 7 10*3/uL (ref 4.0–10.5)

## 2011-11-20 LAB — POCT I-STAT, CHEM 8
BUN: 19 mg/dL (ref 6–23)
Calcium, Ion: 1.21 mmol/L (ref 1.12–1.32)
Hemoglobin: 11.9 g/dL — ABNORMAL LOW (ref 13.0–17.0)
Sodium: 143 mEq/L (ref 135–145)
TCO2: 24 mmol/L (ref 0–100)

## 2011-11-20 LAB — CK TOTAL AND CKMB (NOT AT ARMC)
Relative Index: 2.5 (ref 0.0–2.5)
Total CK: 112 U/L (ref 7–232)

## 2011-11-20 LAB — PROTIME-INR: Prothrombin Time: 12.6 seconds (ref 11.6–15.2)

## 2011-11-20 LAB — COMPREHENSIVE METABOLIC PANEL
ALT: 14 U/L (ref 0–53)
BUN: 18 mg/dL (ref 6–23)
CO2: 26 mEq/L (ref 19–32)
Calcium: 9.2 mg/dL (ref 8.4–10.5)
Creatinine, Ser: 0.69 mg/dL (ref 0.50–1.35)
GFR calc Af Amer: >90 mL/min (ref >90)
GFR calc non Af Amer: >90 mL/min (ref >90)
Glucose, Bld: 119 mg/dL — ABNORMAL HIGH (ref 70–99)
Sodium: 139 mEq/L (ref 135–145)
Total Protein: 6.7 g/dL (ref 6.0–8.3)

## 2011-11-20 LAB — GLUCOSE, CAPILLARY: Glucose-Capillary: 115 mg/dL — ABNORMAL HIGH (ref 70–99)

## 2011-11-20 LAB — AMMONIA: Ammonia: 24 umol/L (ref 11–60)

## 2011-11-20 MED ORDER — ASPIRIN 325 MG PO TABS: 325.0000 mg | ORAL_TABLET | Freq: Once | ORAL | Status: DC

## 2011-11-20 NOTE — Consult Note (Signed)
Neurology Consult Note  Referring Physician: Dr. Maisie Fus Primary Care Physician: No primary provider on file.  Chief Complaint: Code Stroke  HPI: [name] is a 66 y.o.  male who presents as a code stroke.  The patient was at home today doing some yard work this afternoon.  He developed some chest pain with resolved with some rest.  Later this evening while watching TV, the patient again developed severe chest pain.  His wife did not stay he was diaphoretic or had any change in color.  He complained of severe chest pain and was given nitro x3 which was not helpful.  After the nitro his wife called EMS.  When EMS arrived he was still talking and then acutely stopped talking.  He did not lose consciousness, hallucinate, slur, or have facial droop.  His left side at baseline is weaker than the right due to prior strokes.    Current facility-administered medications:aspirin tablet 325 mg, 325 mg, Oral, Once, [place], MD Current outpatient prescriptions:aspirin 81 MG tablet, Take 81 mg by mouth daily.  , Disp: , Rfl: ;  clonazePAM (KLONOPIN) 1 MG tablet, Take 1 mg by mouth at bedtime.  , Disp: , Rfl: ;  clopidogrel (PLAVIX) 75 MG tablet, Take 75 mg by mouth daily.  , Disp: , Rfl: ;  metoprolol tartrate (LOPRESSOR) 25 MG tablet, Take 12.5 mg by mouth 2 (two) times daily.  , Disp: , Rfl:  nitroGLYCERIN (NITROSTAT) 0.4 MG SL tablet, Place 0.4 mg under the tongue every 5 (five) minutes as needed. For chest pain , Disp: , Rfl: ;  ondansetron (ZOFRAN) 8 MG tablet, Take 8 mg by mouth every 8 (eight) hours as needed. For nausea  , Disp: , Rfl: ;  pravastatin (PRAVACHOL) 80 MG tablet, Take 80 mg by mouth at bedtime.  , Disp: , Rfl:   Past Medical History  Diagnosis Date  . Coronary artery disease   . Stroke      Past Surgical History  Procedure Date  . Cardiac catheterization   . Coronary angioplasty     Allergies  Allergen Reactions  . Clindamycin/Lincomycin Other (See Comments)   Blisters in mouth  . Dilaudid (Hydromorphone Hcl) Other (See Comments)    "CRASHES"    No family history on file.  History  Substance Use Topics  . Smoking status: Former Games developer  . Smokeless tobacco: Not on file  . Alcohol Use: No      Review of Systems A complete review of systems was performed and was negative.  Physical Exam: BP 154/118  Pulse 82  Temp(Src) 97.9 F (36.6 C) (Oral)  Resp 14  SpO2 98%  GENERAL:     Well nourished, supine, nonverbal.   CARDIOVASCULAR:   - Regular rate and rhythm, no thrills or palpable murmurs, S1, S2, no murmur, no rubs or gallops.   MENTAL STATUS EXAM:    - Orientation: Alert, nonverbal, would attempt grunt at times. - Memory: Cooperative, follows some commands.  - Attention, concentration: Attention and concentration poor.  - Language: Nonverbal, rare grunts.  Spoke one time per EMS - Fund of knowledge: Nonverbal  CRANIAL NERVES:    - CN 2 (Optic): Blinks to threat bilaterally, funduscopic examination without optic disk pallor or edema, retinal vessels are normal.  - CN 3,4,6 (EOM): Pupils equal and reactive to light and near full eye movement without nystagmus.  - CN 5 (Trigeminal): Grimace to pain bilaterally - CN 7 (Facial): No facial weakness or asymmetry.  -  CN 8 (Auditory): grossly normal - CN 9,10 (Glossophar): The uvula is midline, the palate elevates symmetrically.  - CN 11 (spinal access): Normal sternocleidomastoid and trapezius strength.  -CN 12 (Hypoglossal): The tongue is midline. No atrophy or fasciculations.     MOTOR:  Moved all extremities on command, but didn't cooperate with more formal testing.  Can hold all limbs off the bed without drift.  Moves right side more than the left side.   Muscle Tone: Tone and muscle bulk are normal in the upper and lower extremities.   REFLEXES:   - Biceps:                 (R): 2+  (L):2+  - Brachioradialis:    (R): 2+  (L): 2+  - Patellar:                (R): 2+   (L):2+  -  Achilles:                (R): 1+   (L):1+  - Babinski:   (R): absent  (L): present  COORDINATION:  finger-to-nose intact, no tremor.   SENSATION:   Intact to pain in all extremities.   GAIT: Deferred  NIHSS: 11 (mild drift in all 4 limb, and no speech)  Diagnostic Studies: CT Head w/o- no acute intracranial abnormality, mild atrophy, multifocal cerebral artery calcifications  Impression: 66 y/o male with hx of stroke presenting with chest pain followed by expressive aphasia.  The patient has had prior brain stem infarcts.  I do not believe his current constellation of symptoms are due to a stroke.  If he were to have an expressive aphasia, he should have right sided weakness or left gaze preference in addition to this.  This is not a brain stem stroke with severe dysarthria or top of the basilar syndrome as those will manifest with other brain stem abnormalities on exam or potentially hallucinations of which he has neither.  His lack of verbal responses could be due to an encephalopathy which can come from a multitude of causes including cardiac origin given his chest pain at the beginning of this episode, metabolic causes, infectious, medication related, or it could be a psychosocial response to the stress of this situation.  tPA was not given as it was felt the cause of his symptoms were not strokes and the code stroke was cancelled.  Recommendations:  1.  Recommend cardiac workup for chest pain 2.  Obtain UA and culture. 3.  Can obtain MRI brain without. 4.  Continue on Aspirin and Plavix.  It has been a pleasure to participate in the care of this patient.  Best Regards, Lajuana Carry, MD

## 2011-11-20 NOTE — Code Documentation (Signed)
66yo male arrived via EMS after developing chest pain at home around 2130, patient's wife gave baby ASA and nitro. When EMS arrived at 2140 patient was completely aphasic, left side weak from previous CVA in Feb 2013. Code stroke called at 2212, patient arrived at 2234, EDP exam at 2235, LSN at 2130, aphasia developed per EMS at 2140, patient arrived to CT at 2236, phlebotomy arrived at 2235, CT read by Dr. Georgiana Shore at 2248. Initial NIH 11 which includes deficits from pervious stroke. Code Stroke cancelled by Dr. Georgiana Shore at 2309.

## 2011-11-20 NOTE — ED Notes (Addendum)
Per EMS family noted pt was not acting normal all day, around 2130 pt started complaining of chest pain, took ASA  and nitro before EMS arrived. While EMS was there around 2140 pt become none verbal and was only moving right side. Pt has Hx CVA in feb of  With left side residual.

## 2011-11-20 NOTE — ED Provider Notes (Signed)
History     CSN: [id number]  Arrival date & time 11/20/11  2233   First MD Initiated Contact with Patient 11/20/11 2236      No chief complaint on file.   (Consider location/radiation/quality/duration/timing/severity/associated sxs/prior treatment) HPI Comments: Hx of brainstem stroke.  Slurring speech today at home.  After medics arrived pt had decreased responsiveness.  Not responding or following commands since then.  Patient is a 66 y.o. male presenting with neurologic complaint. The history is provided by the patient.  Neurologic Problem The primary symptoms include speech change. Primary symptoms do not include loss of consciousness. Episode onset: 1 hour ago. The episode lasted 1 hour. The symptoms are unchanged. The neurological symptoms are diffuse. Context: no injury reported.    Past Medical History  Diagnosis Date  . Coronary artery disease   . Stroke     Past Surgical History  Procedure Date  . Cardiac catheterization   . Coronary angioplasty     No family history on file.  History  Substance Use Topics  . Smoking status: Former Games developer  . Smokeless tobacco: Not on file  . Alcohol Use: No      Review of Systems  Unable to perform ROS Neurological: Positive for speech change. Negative for loss of consciousness.    Allergies  Clindamycin/lincomycin and Dilaudid  Home Medications   Current Outpatient Rx  Name Route Sig Dispense Refill  . ASPIRIN 81 MG PO TABS Oral Take 81 mg by mouth daily.      Marland Kitchen CLONAZEPAM 1 MG PO TABS Oral Take 1 mg by mouth at bedtime.      . CLOPIDOGREL BISULFATE 75 MG PO TABS Oral Take 75 mg by mouth daily.      Marland Kitchen METOPROLOL TARTRATE 25 MG PO TABS Oral Take 12.5 mg by mouth 2 (two) times daily.      Marland Kitchen NITROGLYCERIN 0.4 MG SL SUBL Sublingual Place 0.4 mg under the tongue every 5 (five) minutes as needed. For chest pain     . ONDANSETRON HCL 8 MG PO TABS Oral Take 8 mg by mouth every 8 (eight) hours as needed. For nausea        . PRAVASTATIN SODIUM 80 MG PO TABS Oral Take 80 mg by mouth at bedtime.        BP 125/76  Pulse 67  Temp(Src) 97.9 F (36.6 C) (Oral)  Resp 15  SpO2 97%  Physical Exam  Constitutional: He appears well-developed and well-nourished. No distress.  HENT:  Head: Normocephalic and atraumatic.  Eyes: Conjunctivae and EOM are normal. Pupils are equal, round, and reactive to light. No scleral icterus.  Neck: Normal range of motion. Neck supple.  Cardiovascular: Normal rate and regular rhythm.  Exam reveals no gallop and no friction rub.   No murmur heard. Pulmonary/Chest: Effort normal and breath sounds normal. No respiratory distress. He has no wheezes. He has no rales. He exhibits no tenderness.  Abdominal: Soft. He exhibits no distension and no mass. There is no tenderness. There is no rebound and no guarding.  Musculoskeletal: Normal range of motion. He exhibits no edema and no tenderness.  Neurological: He exhibits normal muscle tone.       Starring into space.  Not following commands.  Tone in all extremities.  Skin: Skin is warm and dry. No rash noted. He is not diaphoretic. No erythema.  Psychiatric: He has a normal mood and affect. His behavior is normal. Judgment and thought content normal.    ED  Course  Procedures (including critical care time)   Date: 11/20/2011  Rate: 81  Rhythm: normal sinus rhythm  QRS Axis: normal  Intervals: normal  ST/T Wave abnormalities: normal  Conduction Disutrbances:right bundle branch block  Narrative Interpretation:   Old EKG Reviewed: unchanged    Labs Reviewed  CBC - Abnormal; Notable for the following:    RBC 4.05 (*)    Hemoglobin 12.4 (*)    HCT 34.3 (*)    MCHC 36.2 (*)    All other components within normal limits  COMPREHENSIVE METABOLIC PANEL - Abnormal; Notable for the following:    Glucose, Bld 119 (*)    All other components within normal limits  POCT I-STAT, CHEM 8 - Abnormal; Notable for the following:    Glucose,  Bld 122 (*)    Hemoglobin 11.9 (*)    HCT 35.0 (*)    All other components within normal limits  GLUCOSE, CAPILLARY - Abnormal; Notable for the following:    Glucose-Capillary 115 (*)    All other components within normal limits  PROTIME-INR  APTT  DIFFERENTIAL  CK TOTAL AND CKMB  TROPONIN I  URINE RAPID DRUG SCREEN (HOSP PERFORMED)  AMMONIA   Ct Head Wo Contrast  11/20/2011  *RADIOLOGY REPORT*  Clinical Data: Sudden onset of aphasia the.  Altered mental status.  CT HEAD WITHOUT CONTRAST  Technique:  Contiguous axial images were obtained from the base of the skull through the vertex without contrast.  Comparison: CT head 06/05/2011.  MRI brain 06/05/2011.  Findings: The ventricles and sulci appear symmetrical.  No mass effect or midline shift.  No ventricular dilatation.  No abnormal extra-axial fluid collections.  The gray-white matter junctions are distinct.  The basal cisterns are not effaced.  No evidence of acute intracranial hemorrhage.  No depressed skull fractures. Visualized paranasal sinuses and mastoid air cells are not opacified.  No significant changes since the previous study.  IMPRESSION: No acute intracranial abnormalities.  Negative code stroke results were telephoned to Dr. Judd Lien at the time of dictation, 2251 hours on 11/20/2011.  Original Report Authenticated By: Marlon Pel, M.D.     1. Chest pain   2. Altered mental status       MDM  Hx of brainstem stroke.  Slurring speech today at home after having chest pain.  After medics arrived pt had decreased responsiveness.  Not responding or following commands since then.  VSS.  Exam shows tone in all extremities but pt nonresponsive.   Code stroke initiated.  Negative haad CT.  Neuro has evaled.  Code stroke deactivated - not believed to be neurologic in nature.  Recommended medicine admission.  EKG unconcerning.  ASA given.  Hospitalist to admit for ACS rule out and MRI brain.        Army Chaco,  MD 11/20/11 [phone]

## 2011-11-20 NOTE — ED Notes (Signed)
Pt attempting  to mouth word, otherwise  moves right hand spontaneously  But not on command.

## 2011-11-21 ENCOUNTER — Inpatient Hospital Stay (HOSPITAL_COMMUNITY): Payer: Non-veteran care

## 2011-11-21 ENCOUNTER — Encounter (HOSPITAL_COMMUNITY): Payer: Self-pay | Admitting: Internal Medicine

## 2011-11-21 ENCOUNTER — Observation Stay (HOSPITAL_COMMUNITY): Payer: Non-veteran care

## 2011-11-21 DIAGNOSIS — R4701 Aphasia: Secondary | ICD-10-CM

## 2011-11-21 DIAGNOSIS — R079 Chest pain, unspecified: Secondary | ICD-10-CM

## 2011-11-21 DIAGNOSIS — D649 Anemia, unspecified: Secondary | ICD-10-CM | POA: Diagnosis present

## 2011-11-21 DIAGNOSIS — D696 Thrombocytopenia, unspecified: Secondary | ICD-10-CM | POA: Diagnosis present

## 2011-11-21 DIAGNOSIS — G9341 Metabolic encephalopathy: Secondary | ICD-10-CM | POA: Diagnosis present

## 2011-11-21 DIAGNOSIS — R0789 Other chest pain: Secondary | ICD-10-CM | POA: Diagnosis present

## 2011-11-21 DIAGNOSIS — I2510 Atherosclerotic heart disease of native coronary artery without angina pectoris: Secondary | ICD-10-CM

## 2011-11-21 LAB — CARDIAC PANEL(CRET KIN+CKTOT+MB+TROPI)
CK, MB: 2.3 ng/mL (ref 0.3–4.0)
CK, MB: 2.2 ng/mL (ref 0.3–4.0)
Relative Index: 2.4 (ref 0.0–2.5)
Relative Index: 2.5 (ref 0.0–2.5)
Total CK: 75 U/L (ref 7–232)
Troponin I: <0.3 ng/mL (ref <0.30)
Troponin I: <0.3 ng/mL (ref <0.30)

## 2011-11-21 LAB — COMPREHENSIVE METABOLIC PANEL
Alkaline Phosphatase: 73 U/L (ref 39–117)
BUN: 16 mg/dL (ref 6–23)
GFR calc Af Amer: >90 mL/min (ref >90)
Glucose, Bld: 130 mg/dL — ABNORMAL HIGH (ref 70–99)
Potassium: 4.1 mEq/L (ref 3.5–5.1)
Total Bilirubin: 0.4 mg/dL (ref 0.3–1.2)
Total Protein: 6.4 g/dL (ref 6.0–8.3)

## 2011-11-21 LAB — RAPID URINE DRUG SCREEN, HOSP PERFORMED
Barbiturates: NOT DETECTED
Benzodiazepines: NOT DETECTED

## 2011-11-21 LAB — GLUCOSE, CAPILLARY

## 2011-11-21 LAB — URINALYSIS, ROUTINE W REFLEX MICROSCOPIC
Bilirubin Urine: NEGATIVE
Glucose, UA: NEGATIVE mg/dL
Ketones, ur: NEGATIVE mg/dL
Specific Gravity, Urine: 1.025 (ref 1.005–1.030)
pH: 5.5 (ref 5.0–8.0)

## 2011-11-21 LAB — FERRITIN: Ferritin: 182 ng/mL (ref 22–322)

## 2011-11-21 LAB — CBC
HCT: 32.9 % — ABNORMAL LOW (ref 39.0–52.0)
Platelets: 149 10*3/uL — ABNORMAL LOW (ref 150–400)
RDW: 12.2 % (ref 11.5–15.5)
WBC: 6.2 10*3/uL (ref 4.0–10.5)

## 2011-11-21 LAB — LIPID PANEL
Cholesterol: 154 mg/dL (ref 0–200)
Triglycerides: 100 mg/dL (ref <150)

## 2011-11-21 LAB — FOLATE: Folate: 19.6 ng/mL

## 2011-11-21 LAB — HEMOGLOBIN A1C
Hgb A1c MFr Bld: 5.6 % (ref <5.7)
Mean Plasma Glucose: 114 mg/dL (ref <117)

## 2011-11-21 LAB — IRON AND TIBC
Iron: 92 ug/dL (ref 42–135)
TIBC: 245 ug/dL (ref 215–435)
UIBC: 153 ug/dL (ref 125–400)

## 2011-11-21 LAB — URINE MICROSCOPIC-ADD ON

## 2011-11-21 MED ORDER — ONDANSETRON HCL 4 MG/2ML IJ SOLN
4.0000 mg | Freq: Once | INTRAMUSCULAR | Status: AC
  Administered 2011-11-21: 4 mg via INTRAVENOUS

## 2011-11-21 MED ORDER — IBUPROFEN 400 MG PO TABS
400.0000 mg | ORAL_TABLET | ORAL | Status: DC | PRN
  Administered 2011-11-21 – 2011-11-22 (×2): 400 mg via ORAL
  Filled 2011-11-21 (×2): qty 1

## 2011-11-21 MED ORDER — NITROGLYCERIN 0.4 MG/HR TD PT24
0.4000 mg | MEDICATED_PATCH | Freq: Every day | TRANSDERMAL | Status: DC
  Administered 2011-11-23: 0.4 mg via TRANSDERMAL
  Filled 2011-11-21 (×3): qty 1

## 2011-11-21 MED ORDER — MORPHINE SULFATE 2 MG/ML IJ SOLN
1.0000 mg | INTRAMUSCULAR | Status: DC | PRN
  Administered 2011-11-21: 1 mg via INTRAVENOUS
  Filled 2011-11-21 (×2): qty 1

## 2011-11-21 MED ORDER — CLONAZEPAM 1 MG PO TABS
1.0000 mg | ORAL_TABLET | Freq: Every day | ORAL | Status: DC
  Administered 2011-11-21 – 2011-11-22 (×2): 1 mg via ORAL
  Filled 2011-11-21 (×2): qty 1

## 2011-11-21 MED ORDER — SODIUM CHLORIDE 0.9 % IV SOLN
INTRAVENOUS | Status: DC
  Administered 2011-11-21 – 2011-11-23 (×4): via INTRAVENOUS

## 2011-11-21 MED ORDER — MORPHINE SULFATE 2 MG/ML IJ SOLN
1.0000 mg | INTRAMUSCULAR | Status: AC
  Administered 2011-11-21: 1 mg via INTRAVENOUS

## 2011-11-21 MED ORDER — ASPIRIN 325 MG PO TABS
325.0000 mg | ORAL_TABLET | Freq: Every day | ORAL | Status: DC
  Administered 2011-11-21: 325 mg via ORAL
  Filled 2011-11-21 (×2): qty 1

## 2011-11-21 MED ORDER — MORPHINE SULFATE 2 MG/ML IJ SOLN
2.0000 mg | INTRAMUSCULAR | Status: AC | PRN
  Administered 2011-11-21 (×2): 2 mg via INTRAVENOUS
  Filled 2011-11-21 (×2): qty 1

## 2011-11-21 MED ORDER — NITROGLYCERIN IN D5W 200-5 MCG/ML-% IV SOLN
2.0000 ug/min | INTRAVENOUS | Status: DC
  Administered 2011-11-21: 16.667 ug/min via INTRAVENOUS
  Filled 2011-11-21: qty 250

## 2011-11-21 MED ORDER — CYCLOBENZAPRINE HCL 5 MG PO TABS
5.0000 mg | ORAL_TABLET | Freq: Three times a day (TID) | ORAL | Status: DC
  Administered 2011-11-21 – 2011-11-22 (×4): 5 mg via ORAL
  Filled 2011-11-21 (×12): qty 1

## 2011-11-21 MED ORDER — NITROGLYCERIN 2 % TD OINT: 1.0000 [in_us] | TOPICAL_OINTMENT | Freq: Once | TRANSDERMAL | Status: AC

## 2011-11-21 MED ORDER — CLOPIDOGREL BISULFATE 75 MG PO TABS
75.0000 mg | ORAL_TABLET | Freq: Every day | ORAL | Status: DC
  Administered 2011-11-21 – 2011-11-23 (×3): 75 mg via ORAL
  Filled 2011-11-21 (×3): qty 1

## 2011-11-21 MED ORDER — SERTRALINE HCL 100 MG PO TABS
200.0000 mg | ORAL_TABLET | Freq: Every day | ORAL | Status: DC
  Administered 2011-11-21 – 2011-11-23 (×3): 200 mg via ORAL
  Filled 2011-11-21 (×3): qty 2

## 2011-11-21 MED ORDER — GABAPENTIN 300 MG PO CAPS
300.0000 mg | ORAL_CAPSULE | Freq: Two times a day (BID) | ORAL | Status: DC
  Administered 2011-11-21 – 2011-11-23 (×5): 300 mg via ORAL
  Filled 2011-11-21 (×7): qty 1

## 2011-11-21 MED ORDER — ONDANSETRON HCL 4 MG/2ML IJ SOLN
INTRAMUSCULAR | Status: AC
  Filled 2011-11-21: qty 2

## 2011-11-21 MED ORDER — ASPIRIN 81 MG PO CHEW: 81.0000 mg | CHEWABLE_TABLET | Freq: Every day | ORAL | Status: DC

## 2011-11-21 MED ORDER — IBUPROFEN 600 MG PO TABS
600.0000 mg | ORAL_TABLET | Freq: Three times a day (TID) | ORAL | Status: DC
  Filled 2011-11-21 (×3): qty 1

## 2011-11-21 MED ORDER — NITROGLYCERIN 2 % TD OINT
TOPICAL_OINTMENT | TRANSDERMAL | Status: AC
  Administered 2011-11-21: 1 [in_us]
  Filled 2011-11-21: qty 1

## 2011-11-21 MED ORDER — ONDANSETRON HCL 4 MG PO TABS: 8.0000 mg | ORAL_TABLET | Freq: Three times a day (TID) | ORAL | Status: DC | PRN

## 2011-11-21 MED ORDER — ACETAMINOPHEN 325 MG PO TABS
650.0000 mg | ORAL_TABLET | ORAL | Status: DC | PRN
  Administered 2011-11-22: 650 mg via ORAL
  Filled 2011-11-21: qty 2

## 2011-11-21 MED ORDER — ENOXAPARIN SODIUM 40 MG/0.4ML ~~LOC~~ SOLN
40.0000 mg | SUBCUTANEOUS | Status: DC
  Administered 2011-11-21 – 2011-11-22 (×2): 40 mg via SUBCUTANEOUS
  Filled 2011-11-21 (×3): qty 0.4

## 2011-11-21 MED ORDER — SIMVASTATIN 10 MG PO TABS
10.0000 mg | ORAL_TABLET | Freq: Every day | ORAL | Status: DC
  Administered 2011-11-21: 10 mg via ORAL
  Filled 2011-11-21 (×3): qty 1

## 2011-11-21 NOTE — Evaluation (Signed)
Speech Language Pathology Evaluation Patient Details Name: LENELL LAMA MRN: [id number] DOB: 1946/06/24 Today's Date: 11/21/2011 Time: 4098-1191 SLP Time Calculation (min): 45 min  Problem List:  Patient Active Problem List  Diagnoses  . CVA (cerebral infarction)  . CAD (coronary artery disease)  . Aphasia  . Chest pain   Past Medical History:  Past Medical History  Diagnosis Date  . Coronary artery disease   . Stroke   . Pneumonia   . Anxiety   . Depression   . Hepatitis    Past Surgical History:  Past Surgical History  Procedure Date  . Cardiac catheterization   . Coronary angioplasty     Assessment / Plan / Recommendation Clinical Impression  Pt presents with baseline aphasia and cognitive deficits now exacerbated, including moderate dysfluency with hesitations and interjections and moderate anomia. Pt also with mild baseline cognitive deficits. The pt would beneift from acute speech therapy to address communication as well at home health SLP at d/c.     SLP Assessment  Patient needs continued Speech Lanaguage Pathology Services    Follow Up Recommendations  Home health SLP (for cognitive linguisitic therapy only)    Frequency and Duration min 2x/week  2 weeks   Pertinent Vitals/Pain NA   SLP Goals  SLP Goals Progress/Goals/Alternative treatment plan discussed with pt/caregiver and they: Agree SLP Goal #1: Pt will respond to questions at conversation level with easy onsets with minimal disfluency with moderate verbal cues.  SLP Goal #2: Pt will utilize compensatory strategies for word finding during confrontational tasks with moderate verbal cues x 10.   SLP Evaluation Prior Functioning  Cognitive/Linguistic Baseline: Baseline deficits Baseline deficit details: Pt with multiple past CVA's details not available as pt was treated at the [place]. Pt's daughter reports at least 4 CVA's in the last two years. The last CVA in February resulted in aphasia with  dysfluencies and difficutly with confrontational tasks.  The pt underwent several weeks of home health speech therapy and improved. the pt is current worse than baseline per daughter, but not as bad as he was on the night of admit.   Type of Home: House Lives With: Spouse Available Help at Discharge: Family Vocation: Retired   IT consultant  Overall Cognitive Status: Impaired Arousal/Alertness: Awake/alert Orientation Level: Oriented X4 Attention: Focused;Sustained;Selective;Alternating;Divided Focused Attention: Appears intact Sustained Attention: Appears intact Selective Attention: Impaired Selective Attention Impairment: Verbal complex;Functional complex Alternating Attention: Impaired Alternating Attention Impairment: Verbal complex;Functional complex Divided Attention: Impaired Divided Attention Impairment: Functional complex;Functional basic;Verbal basic;Verbal complex Memory: Impaired Memory Impairment: Retrieval deficit;Storage deficit Awareness: Appears intact Problem Solving: Appears intact Executive Function:  (appears in tact for basic tasks. ) Safety/Judgment: Appears intact    Comprehension  Auditory Comprehension Yes/No Questions: Within Functional Limits Commands: Within Functional Limits Conversation: Complex Interfering Components: Pain    Expression Verbal Expression Overall Verbal Expression: Impaired Initiation: No impairment Automatic Speech: Name;Social Response;Day of week;Counting (unable to say DOW and count fluently in confrontation) Level of Generative/Spontaneous Verbalization: Word;Phrase;Sentence;Conversation Repetition: Impaired Level of Impairment: Word level Naming: Impairment Responsive: 76-100% accurate (With significant delay, hesitation and interjections) Confrontation: Impaired Convergent: 75-100% accurate (With significant delay, hesitation and interjections) Verbal Errors: Aware of errors (Very disfluent, wordfinding. no  paraphasias) Pragmatics: No impairment Interfering Components:  (pain) Effective Techniques:  (Unable to attempt, pt in pain) Written Expression Dominant Hand: Right   Oral / Motor Oral Motor/Sensory Function Overall Oral Motor/Sensory Function: Impaired Labial ROM: Reduced right;Reduced left Labial Symmetry: Within Functional Limits Labial Strength:  Reduced Labial Sensation: Reduced Lingual ROM: Reduced right;Reduced left Lingual Symmetry: Within Functional Limits Lingual Strength: Reduced Lingual Sensation: Reduced Facial ROM: Within Functional Limits Facial Symmetry: Within Functional Limits Facial Strength: Within Functional Limits Facial Sensation: Reduced Mandible: Within Functional Limits Motor Speech Overall Motor Speech: Impaired at baseline Respiration: Within functional limits Phonation: Normal Resonance: Within functional limits Articulation: Impaired Level of Impairment: Word Intelligibility: Intelligibility reduced Word: 50-74% accurate Phrase: 50-74% accurate Sentence: 50-74% accurate Conversation: 50-74% accurate (dysfluent) Motor Planning: Witnin functional limits Motor Speech Errors: Aware Interfering Components: Premorbid status Effective Techniques: Slow rate     Sallyanne Birkhead, Riley Nearing 11/21/2011, 9:11 AM

## 2011-11-21 NOTE — ED Notes (Signed)
Nitro increased to 23mcq/Hr

## 2011-11-21 NOTE — Progress Notes (Signed)
Nutrition Brief Note:  RD pulled to pt from nutrition risk assessment, trouble chewing or swallowing. Pt was sleeping at time of RD visit but family, wife and son, were in room. Both denied pt had any problems with chewing or swallowing foods or liquids. Pt was started on a D3 diet by SLP for missing dentures. Pt is being seen by SLP for aphasia and cognitive deficits only. Pt is now on a heart healthy diet with no texture modification or thickened liquids.  Pt did not eat meal this am, only drank liquids as he did not like the food.   No nutrition intervention at this time. Please consult if needed.   Clarene Duke MARIE

## 2011-11-21 NOTE — Evaluation (Signed)
Marland Kitchen    Clinical/Bedside Swallow Evaluation Patient Details  Name: [name] [name] MRN: [id number] Date of Birth: 11/18/1945  Today's Date: 11/21/2011 Time:  -     Past Medical History:  Past Medical History  Diagnosis Date  . Coronary artery disease   . Stroke   . Pneumonia   . Anxiety   . Depression   . Hepatitis    Past Surgical History:  Past Surgical History  Procedure Date  . Cardiac catheterization   . Coronary angioplasty    HPI:  The patient was at home today doing some yard work this afternoon.  He developed some chest pain with resolved with some rest.  Later this evening while watching TV, the patient again developed severe chest pain.  His wife did not stay he was diaphoretic or had any change in color.  He complained of severe chest pain and was given nitro x3 which was not helpful.  After the nitro his wife called EMS.  When EMS arrived he was still talking and then acutely stopped talking.  He did not lose consciousness, hallucinate, slur, or have facial droop.  His left side at baseline is weaker than the right due to prior strokes. Head CT is negative for CVA, MRI is pending. Pt and daughter report pt has not had a history of dysphagia but does have some baseline Aphasia from mulitple previous CVA. Pt has completed a round of home health SLP therapy.    Assessment / Plan / Recommendation Clinical Impression  Pt presents with a mild oral dysphagia due to generalized weakness and decreased effort with solids with missing dentition. Pharyngeal function appears normal with no overt s/s of aspiration. Pt is safe to initiate dysphagia 3 (mechanical soft diet) until dentures are present with thin liquids. MD does not want to initiate diet at this time due to possible procedure. No SLP f/u needed for swallowing. Will continue to follow for cognitive linguistic needs.     Aspiration Risk  None    Diet Recommendation Dysphagia 3 (Mechanical Soft);Thin liquid (when cleared  medically. )   Other  Recommendations     Follow Up Recommendations  Home health SLP (for cognitive linguisitic therapy only)    Frequency and Duration        Pertinent Vitals/Pain NA    SLP Swallow Goals     Swallow Study Prior Functional Status       General HPI: The patient was at home today doing some yard work this afternoon.  He developed some chest pain with resolved with some rest.  Later this evening while watching TV, the patient again developed severe chest pain.  His wife did not stay he was diaphoretic or had any change in color.  He complained of severe chest pain and was given nitro x3 which was not helpful.  After the nitro his wife called EMS.  When EMS arrived he was still talking and then acutely stopped talking.  He did not lose consciousness, hallucinate, slur, or have facial droop.  His left side at baseline is weaker than the right due to prior strokes. Head CT is negative for CVA, MRI is pending. Pt and daughter report pt has not had a history of dysphagia but does have some baseline Aphasia from mulitple previous CVA. Pt has completed a round of home health SLP therapy.  Type of Study: Bedside swallow evaluation Diet Prior to this Study: NPO Temperature Spikes Noted: No Respiratory Status: Supplemental O2 delivered via (comment) Behavior/Cognition:  Alert;Cooperative Oral Cavity - Dentition: Dentures, not available Vision: Functional for self-feeding Patient Positioning: Upright in bed Baseline Vocal Quality: Clear Volitional Cough: Strong Volitional Swallow: Able to elicit    Oral/Motor/Sensory Function Overall Oral Motor/Sensory Function: Impaired Labial ROM: Reduced right;Reduced left Labial Symmetry: Within Functional Limits Labial Strength: Reduced Labial Sensation: Reduced Lingual ROM: Reduced right;Reduced left Lingual Symmetry: Within Functional Limits Lingual Strength: Reduced Lingual Sensation: Reduced Facial ROM: Within Functional  Limits Facial Symmetry: Within Functional Limits Facial Strength: Within Functional Limits Facial Sensation: Reduced Mandible: Within Functional Limits   Ice Chips Ice chips: Not tested   Thin Liquid Thin Liquid: Within functional limits    Nectar Thick Nectar Thick Liquid: Not tested   Honey Thick Honey Thick Liquid: Not tested   Puree Puree: Within functional limits   Solid Solid: Impaired Presentation: Self Fed Oral Phase Functional Implications: Other (comment) (slow, weak mastication. missing dentition, generalized weakn)    Jericho Alcorn, Riley Nearing 11/21/2011,8:51 AM

## 2011-11-21 NOTE — Consult Note (Signed)
CARDIOLOGY CONSULT NOTE  [facility] Resident Note  Patient ID: [name] [name] MRN: [id number] DOB/AGE: 07-16-1945 66 y.o.  Admit date: 11/20/2011 Referring Physician: Dr. Butler Denmark Primary Physician: [facility] Primary Cardiologist: Dr. Yetta Barre, [facility] Reason for Consultation: Chest pain  HPI: Mr. Heinlen is a 66 year old man with a PMH significant for CVA, CAD s/p DES stent to ramus intermediate in November 2012, aphasia, PTSD, and carpel tunnel syndrome who presented to the [facility] ED complaining of chest pain and slurred speech.  He states that the chest pain started earlier in the day after he and his wife moved a utility cart about 20 feet.  The pain was in the left anterior chest and radiated to the sternum with occasional pain radiating to the neck and left shoulder.  The pain was a pressure and ache.  The pain resolved with rest and then recurred while they were watching TV last night.  His wife gave him 4 baby ASA to chew and 2 nitroglycerin and then called EMS.  He was initially called as a code stroke in the ED because of the slurred speech.  Neurology was consulted and canceled the code stroke and suggested cardiology work up.  He was started on Nitroglycerin drip and transferred to the CCU.  He states that he did get some relief from the pain with morphine but that this morning the pain recurred.  He states that it does not worsen with breathing but does with palpation.  He denies diaphoresis, shortness of breath, nausea, or vomiting.  He has not had a cough, fever, chills, diarrhea, or constipation recently.  He did have a fall about 4 weeks ago.  He was standing on the dock, getting into a boat, and fell into the boat, landing on his left shoulder and chest.  X-ray done at his PCP office at the [place] a week ago was negative for rib fracture and he has been taking Naproxen 500 mg BID.    Past Medical History  Diagnosis Date  . Coronary artery disease   . Stroke   .  Pneumonia   . Anxiety   . Depression   . Hepatitis      Past Surgical History  Procedure Date  . Cardiac catheterization   . Coronary angioplasty     Family History  Problem Relation Age of Onset  . Heart disease Mother     Several other maternal family members    Social History: History   Social History  . Marital Status: Married    Spouse Name: N/A    Number of Children: N/A  . Years of Education: N/A   Occupational History  . Not on file.   Social History Main Topics  . Smoking status: Former Smoker -- 2.0 packs/day for 20 years    Types: Cigarettes    Quit date: 11/21/1991  . Smokeless tobacco: Never Used  . Alcohol Use: No  . Drug Use: No  . Sexually Active: Not on file   Other Topics Concern  . Not on file   Social History Narrative  . No narrative on file    Prescriptions prior to admission  Medication Sig Dispense Refill  . aspirin 81 MG tablet Take 81 mg by mouth daily.       . clonazePAM (KLONOPIN) 1 MG tablet Take 1 mg by mouth at bedtime.        . clopidogrel (PLAVIX) 75 MG tablet Take 75 mg by mouth daily.        Marland Kitchen  gabapentin (NEURONTIN) 100 MG capsule Take 300 mg by mouth 2 (two) times daily.      . naproxen (NAPROSYN) 500 MG tablet Take 500 mg by mouth 2 (two) times daily as needed. For shoulder pain      . nitroGLYCERIN (NITROSTAT) 0.4 MG SL tablet Place 0.4 mg under the tongue every 5 (five) minutes as needed. For chest pain       . pravastatin (PRAVACHOL) 20 MG tablet Take 10 mg by mouth daily.      . sertraline (ZOLOFT) 100 MG tablet Take 200 mg by mouth daily.      . STUDY MEDICATION Take 2 tablets by mouth 2 (two) times daily. Study med from [place]. -INV-RIVER PCI Ranolazine       ROS: Bold if positive  General:  fevers/chills/night sweats Eyes:  blurry vision, diplopia,  amaurosis ENT:  sore throat , hearing loss Resp: cough, wheezing,  hemoptysis CV: edema , palpitations GI:  abdominal pain, nausea, vomiting, diarrhea,  constipation GU:  dysuria, frequency,  hematuria Skin:  rash Neuro: headache, numbness, tingling,  weakness of extremities Musculoskeletal: joint pain or swelling Heme:  bleeding, DVT, or easy bruising Endo:  polydipsia , polyuria   Physical Exam: Blood pressure 100/66, pulse 63, temperature 97.7 F (36.5 C), temperature source Oral, resp. rate 14, height 5\' 10"  (1.778 m), weight 226 lb 10.1 oz (102.8 kg), SpO2 99.00%.  General: Vital signs reviewed and noted. Well-developed, well-nourished man in no acute distress; alert, appropriate and cooperative throughout examination.  HEENT: normal Neck: JVP normal. Carotid upstrokes normal without bruits. No thyromegaly. Lungs: Normal respiratory effort. Clear to auscultation BL without crackles or wheezes. CV: RRR without murmur or gallop.  There is moderate tenderness to palpation over the left chest with a discernable mass felt with possible fluctuance.  The mass is not mobile but is painful to palpation.  Abd: soft, NT, +BS, no bruit, no hepatosplenomegaly Back: no CVA tenderness Ext: no clubbing, cyanosis, edema         Femoral pulses 2+equal without bruits        DP/PT pulses intact and equal Skin: warm and dry without rash Neuro: CNII-XII intact             Strength intact = bilaterally  Labs: CBC:  Basename 11/21/11 0544 11/20/11 2241 11/20/11 2240  WBC 6.2 -- 7.0  NEUTROABS -- -- 3.6  HGB 11.7* 11.9* --  HCT 32.9* 35.0* --  MCV 85.0 -- 84.7  PLT 149* -- 174   CMET:  Lab 11/21/11 0544  NA 143  K 4.1  CL 109  CO2 26  BUN 16  CREATININE 0.70  CALCIUM 8.6  PROT 6.4  BILITOT 0.4  ALKPHOS 73  ALT 12  AST 16  GLUCOSE 130*   Cardiac Enzymes:  Basename 11/21/11 0219 11/21/11 0116 11/20/11 2240  CKTOTAL 104 107 112  CKMB 2.6 2.6 2.8  CKMBINDEX -- -- --  TROPONINI <0.30 <0.30 <0.30   Fasting Lipid Panel:  Basename 11/21/11 0544  CHOL 154  HDL 41  LDLCALC 93  TRIG 100  CHOLHDL 3.8  LDLDIRECT --   CBG:  Basename 11/20/11 2257    GLUCAP 115*    Coagulation:  Basename 11/20/11 2240  LABPROT 12.6  INR 0.92   Urine Drug Screen: Drugs of Abuse     Component Value Date/Time   LABOPIA NONE DETECTED 11/21/2011 0005   COCAINSCRNUR NONE DETECTED 11/21/2011 0005   LABBENZ NONE DETECTED 11/21/2011 0005   AMPHETMU  NONE DETECTED 11/21/2011 0005   THCU NONE DETECTED 11/21/2011 0005   LABBARB NONE DETECTED 11/21/2011 0005      Radiology: Ct Head Wo Contrast  11/20/2011  *RADIOLOGY REPORT*  Clinical Data: Sudden onset of aphasia the.  Altered mental status.  CT HEAD WITHOUT CONTRAST  Technique:  Contiguous axial images were obtained from the base of the skull through the vertex without contrast.  Comparison: CT head 06/05/2011.  MRI brain 06/05/2011.  Findings: The ventricles and sulci appear symmetrical.  No mass effect or midline shift.  No ventricular dilatation.  No abnormal extra-axial fluid collections.  The gray-white matter junctions are distinct.  The basal cisterns are not effaced.  No evidence of acute intracranial hemorrhage.  No depressed skull fractures. Visualized paranasal sinuses and mastoid air cells are not opacified.  No significant changes since the previous study.  IMPRESSION: No acute intracranial abnormalities.  Negative code stroke results were telephoned to Dr. Judd Lien at the time of dictation, 2251 hours on 11/20/2011.  Original Report Authenticated By: Marlon Pel, M.D.   Dg Chest Port 1 View  11/21/2011  *RADIOLOGY REPORT*  Clinical Data: Chest pain.  PORTABLE CHEST - 1 VIEW  Comparison: Chest x-ray 06/06/2011.  Findings: There are patchy bibasilar opacities (left greater than right), favored to represent areas of subsegmental atelectasis. Blunting of the left costophrenic sulcus also suggest a small left- sided pleural effusion.  Pulmonary venous congestion without frank pulmonary edema.  Heart size is borderline enlarged. The patient is rotated to the right on today's exam, resulting in distortion of  the mediastinal contours and reduced diagnostic sensitivity and specificity for mediastinal pathology.  Atherosclerotic calcifications within the arch of the aorta.  IMPRESSION: 1.  Low lung volumes with probable bibasilar subsegmental atelectasis and small left-sided pleural effusion. 2.  Pulmonary venous congestion. 3.  Atherosclerosis.  Original Report Authenticated By: Florencia Reasons, M.D.   EKG: NSR, no ST segment elevation or T wave changes noted.    Last Echo 05/2011:  Study Conclusions  - Left ventricle: The cavity size was at the upper limits of normal. Jamaurion Slemmer thickness was normal. Systolic function was normal. The estimated ejection fraction was in the range of 50% to 55%. - Regional Virg[place] Quigg motion abnormality: Probable, Mild hypokinesis of the basal inferoseptal, basal-mid inferior, and basal-mid inferolateral myocardium. - Aortic valve: Mildly calcified annulus. Trileaflet; normal thickness leaflets. Mild regurgitation. - Left atrium: The atrium was mildly dilated. - Right atrium: The atrium was mildly dilated.  Impressions: - No cardiac source of embolism was identified, but cannot be ruled out on the basis of this examination.  Last Cath: None  ASSESSMENT AND PLAN:  Pt is a 66 y.o. yo male with a PMHx noted above who was admitted on 11/20/2011 for chest pain and slurred speech.  1.  Chest pain:  Differential diagnosis for this patient's chest pain include musculoskeletal chest pain, ACS, PE, pneumonia, pericarditis, or GERD.  He has no infiltrates on the chest x-ray and his WBC count is normal making PNA less likely.  He has no hypoxia and the chest pain is not pleuritic which makes PE much less likely.  He has no EKG changes so ACS and pericarditis is less likely but he does have a concerning history of CAD with recent stent placement.  I am able to reproduce his chest pain exactly on exam and there is question of a possible hematoma in the left chest and a history of trauma  during the fall into  the boat about 4 weeks ago.  He likely aggravated this injury when moving the utility cart yesterday.  His risk stratification included a FLP and A1C.  A1C in December was 5.8 which is borderline and is in process today.  FLP shows an LDL of 93 which is adequate control with the fact that he has not had a STEMI but does have existing CAD.   Recommendations:  - Finish the CE cycling  - Repeat EKG in am.  - NSAID for musculoskeletal chest pain or morphine for severe pain.  - I would hold off on the 2D echo for now for two reasons.  One his last was only about 5 months ago and even though it shows some regional Torina Ey motion abnormalities that was following a relatively recent stent placement and two the location of his pain is right where they would need to press and it will be painful and not give good windows for the echo.    - Continue ASA and Plavix daily  - Consider the addition of a beta blocker and lisinopril as blood pressure allows.    - Okay to transfer to Telemetry.  DVT PPX: Lovenox  Patient history and plan of care reviewed with attending, Dr. Daleen Squibb PRIBULA,CHRISTOPHER 11/21/2011, 10:40 AM  I have taken a history, reviewed medications, allergies, PMH, SH, FH, and reviewed ROS and examined the patient.  I agree with the assessment and plan. Discussed impression and plan with family as well.  Jonny Dearden C. Daleen Squibb, MD, [facility] Pager:  [phone]

## 2011-11-21 NOTE — ED Notes (Signed)
Pt c/o 10/10 Left CP.  NTG drip increased to 35 mcg/min

## 2011-11-21 NOTE — Care Management Note (Signed)
    Page 1 of 1   11/21/2011     10:25:18 AM   CARE MANAGEMENT NOTE 11/21/2011  Patient:  [name] [name], [name] [name]   Account Number:  [id number]  Date Initiated:  11/21/2011  Documentation initiated by:  Junius Creamer  Subjective/Objective Assessment:   adm w expressive apashia, ch pain     Action/Plan:   lives w wife   Anticipated DC Date:  11/23/2011   Anticipated DC Plan:  HOME W HOME HEALTH SERVICES      DC Planning Services  CM consult      Choice offered to / List presented to:             Status of service:   Medicare Important Message given?   (If response is "NO", the following Medicare IM given date fields will be blank) Date Medicare IM given:   Date Additional Medicare IM given:    Discharge Disposition:    Per UR Regulation:  Reviewed for med. necessity/level of care/duration of stay  If discussed at Long Length of Stay Meetings, dates discussed:    Comments:  5/28 debbie Kevon Tench rn,bsn [phone]

## 2011-11-21 NOTE — ED Notes (Signed)
nitro increased to 44mcq/Hr

## 2011-11-21 NOTE — Progress Notes (Signed)
[facility]   Subjective: Awake. Slow to respond to to aphasia which both the patient and his daughter report is now at baseline. Patient continues to complain of left chest pain with pressure as well as significant posterior neck pain which is reproducible with palpation over the posterior neck. Patient endorses that one was having significant chest pain yesterday was very pressure-like and was having significant bilateral jaw pain as well. The daughter clarifies that patient began with chest pain symptoms and then upon arrival of EMS patient became unresponsive and then when did awaken was unable to speak until early this morning before 6 AM. Patient has significant history of undergoing cardiac catheterization in September 2012 with associated coronary artery dissection of the ramus intermedius. At that time he developed hypotension and had his first ischemic related stroke. Since that time she endorses he has had 3 other CVA episodes with the most recently in February 2013. He also eventually underwent additional cardiac catheterization at the [facility] in [facility] with Dr. Yetta Barre in December of 2012.  Objective: Vital signs in last 24 hours: Temp:  [97.5 F (36.4 C)-98.4 F (36.9 C)] 97.7 F (36.5 C) (05/28 0345) Pulse Rate:  [63-83] 63  (05/28 0700) Resp:  [12-18] 14  (05/28 0700) BP: (100-162)/(59-118) 100/66 mmHg (05/28 0700) SpO2:  [95 %-100 %] 99 % (05/28 0700) Weight:  [102.8 kg (226 lb 10.1 oz)] 102.8 kg (226 lb 10.1 oz) (05/28 0345) Weight change:     Intake/Output from previous day: 05/27 0701 - 05/28 0700 In: 357.7 [I.V.:357.7] Out: 1000 [Urine:1000] Intake/Output this shift:    General appearance: alert, cooperative, appears older than stated age and mild distress Resp: clear to auscultation bilaterally-because of ongoing chest pain 2 L nasal cannula oxygen has been apply Cardio: regular rate and sinus rhythm, S1, S2 normal, no murmur, click, rub or gallop, on  nitroglycerin IV at 40 mcg per minute GI: soft, non-tender; bowel sounds normal; no masses,  no organomegaly Extremities: extremities normal, atraumatic, no cyanosis or edema Neurologic: Awake but slow to respond verbally due to chronic aphasia, slightly weaker on the left side which is at baseline  Lab Results:  Basename 11/21/11 0544 11/20/11 2241 11/20/11 2240  WBC 6.2 -- 7.0  HGB 11.7* 11.9* --  HCT 32.9* 35.0* --  PLT 149* -- 174   BMET  Basename 11/21/11 0544 11/20/11 2241 11/20/11 2240  NA 143 143 --  K 4.1 3.8 --  CL 109 106 --  CO2 26 -- 26  GLUCOSE 130* 122* --  BUN 16 19 --  CREATININE 0.70 0.70 --  CALCIUM 8.6 -- 9.2    Studies/Results: Ct Head Wo Contrast  11/20/2011  *RADIOLOGY REPORT*  Clinical Data: Sudden onset of aphasia the.  Altered mental status.  CT HEAD WITHOUT CONTRAST  Technique:  Contiguous axial images were obtained from the base of the skull through the vertex without contrast.  Comparison: CT head 06/05/2011.  MRI brain 06/05/2011.  Findings: The ventricles and sulci appear symmetrical.  No mass effect or midline shift.  No ventricular dilatation.  No abnormal extra-axial fluid collections.  The gray-white matter junctions are distinct.  The basal cisterns are not effaced.  No evidence of acute intracranial hemorrhage.  No depressed skull fractures. Visualized paranasal sinuses and mastoid air cells are not opacified.  No significant changes since the previous study.  IMPRESSION: No acute intracranial abnormalities.  Negative code stroke results were telephoned to Dr. Judd Lien at the time of dictation, 2251 hours on  11/20/2011.  Original Report Authenticated By: Marlon Pel, M.D.   Dg Chest Port 1 View  11/21/2011  *RADIOLOGY REPORT*  Clinical Data: Chest pain.  PORTABLE CHEST - 1 VIEW  Comparison: Chest x-ray 06/06/2011.  Findings: There are patchy bibasilar opacities (left greater than right), favored to represent areas of subsegmental atelectasis.  Blunting of the left costophrenic sulcus also suggest a small left- sided pleural effusion.  Pulmonary venous congestion without frank pulmonary edema.  Heart size is borderline enlarged. The patient is rotated to the right on today's exam, resulting in distortion of the mediastinal contours and reduced diagnostic sensitivity and specificity for mediastinal pathology.  Atherosclerotic calcifications within the arch of the aorta.  IMPRESSION: 1.  Low lung volumes with probable bibasilar subsegmental atelectasis and small left-sided pleural effusion. 2.  Pulmonary venous congestion. 3.  Atherosclerosis.  Original Report Authenticated By: Florencia Reasons, M.D.    Medications: I have reviewed the patient's current medications.  Assessment/Plan:  Principal Problem:  *Chest pain/ CAD (coronary artery disease) *EKG has been negative-cardiac isoenzymes x3 collections have been negative *Has apparent history of prior coronary intervention with stent-will need to obtain records from the [facility] *Because of ongoing chest pain I have asked [facility] cardiology to consult *Continue IV nitroglycerin *2-D echocardiogram has been discontinued by cardiology since patient recently had 2-D echocardiogram 5 months prior *Has thrombocytopenia but platelets are greater than 100 so will continue aspirin and Plavix for now *Could have GI etiology was on regular Naprosyn prior to admission for shoulder pain given the low platelets as well we will stop this medication for now *Cardiology suspects possible musculoskeletal pain-patient recently had a fall while riding in a boat and hit his left chest  Active Problems:  Aphasia/Metabolic encephalopathy *Has chronic aphasia which acutely worsened after patient developed chest pain but has subsequently improved since admission *CT of the head negative for acute CVA-  MRI pending *We'll check a urinalysis to rule out possible urinary tract infection as contributing as  well *Bedside swallowing evaluation completed and patient appropriate for D3 diet *Continue cognitive linguistic therapy while here and recommendation is to continue at home after discharge   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 *Currently appears to be at baseline we'll continue to monitor and followup on MRI this admission   Acute respiratory failure with hypoxia *Suspect primary use of oxygen is for ongoing chest pain   Thrombocytopenia/Anemia *Etiology unclear-was on Plavix before admission and this could be the culprit *Also presented with altered mentation and could have a gram-negative infection such as urinary tract infection *Was on Motrin prior to admission this could also contribute to tell the cytopenia as well as blood loss anemia from the GI tract-will check stools for blood and an anemia panel   Disposition *Remain in step down    LOS: 1 day   Junious Silk, ANP pager [phone]  [facility]-team 1 [email or web address] Password: [email or web address]  11/21/2011, 10:33 AM   I have examined the patient and reviewed the chart. I agree with the above note.   Calvert Cantor, MD [phone]

## 2011-11-21 NOTE — H&P (Addendum)
[name] [name] is an 66 y.o. male.   PCP - [facility]. Chief Complaint: Difficulty speaking and chest pain. HPI: 66 year old male with known history of CAD status post stenting, coronary artery dissection during cardiac catheter, CVA had experienced chest pain last evening around 3 PM after he pushed it regular. Subsequent to that he felt palpitations, chest pain which improved with rest. Later in the evening around 8.30 PM patient started having chest pain again. Patient's wife gave him nitroglycerin twice sublingual despite the chest pain persisted. EMS was called and aspirin was given. Once EMS reached patient also became aphasic and was having difficulty moving the left side both upper and lower extremity. Initially patient was brought as a code stroke. CT head is negative for anything acute. Neurologist on-call was consulted. As per the neurologist it was felt patient did not have a stroke and code stroke was canceled. Patient's left upper and lower extremity strengths started improving but patient still aphasic but able to follow commands. Cardiac enzymes EKG were negative for anything acute. Chest x-ray is pending. Patient will be admitted for further workup.  Past Medical History  Diagnosis Date  . Coronary artery disease   . Stroke     Past Surgical History  Procedure Date  . Cardiac catheterization   . Coronary angioplasty     History reviewed. No pertinent family history. Social History:  reports that he has quit smoking. He does not have any smokeless tobacco history on file. He reports that he does not drink alcohol. His drug history not on file.  Allergies:  Allergies  Allergen Reactions  . Clindamycin/Lincomycin Other (See Comments)    Blisters in mouth  . Dilaudid (Hydromorphone Hcl) Other (See Comments)    "CRASHES"     (Not in a hospital admission)  Results for orders placed during the hospital encounter of 11/20/11 (from the past 48 hour(s))  PROTIME-INR     Status:  Normal   Collection Time   11/20/11 10:40 PM      Component Value Range Comment   Prothrombin Time 12.6  11.6 - 15.2 (seconds)    INR 0.92  0.00 - 1.49    APTT     Status: Normal   Collection Time   11/20/11 10:40 PM      Component Value Range Comment   aPTT 32  24 - 37 (seconds)   CBC     Status: Abnormal   Collection Time   11/20/11 10:40 PM      Component Value Range Comment   WBC 7.0  4.0 - 10.5 (K/uL)    RBC 4.05 (*) 4.22 - 5.81 (MIL/uL)    Hemoglobin 12.4 (*) 13.0 - 17.0 (g/dL)    HCT 29.5 (*) 28.4 - 52.0 (%)    MCV 84.7  78.0 - 100.0 (fL)    MCH 30.6  26.0 - 34.0 (pg)    MCHC 36.2 (*) 30.0 - 36.0 (g/dL)    RDW 13.2  44.0 - 10.2 (%)    Platelets 174  150 - 400 (K/uL)   DIFFERENTIAL     Status: Normal   Collection Time   11/20/11 10:40 PM      Component Value Range Comment   Neutrophils Relative 52  43 - 77 (%)    Neutro Abs 3.6  1.7 - 7.7 (K/uL)    Lymphocytes Relative 39  12 - 46 (%)    Lymphs Abs 2.7  0.7 - 4.0 (K/uL)    Monocytes Relative 7  3 - 12 (%)    Monocytes Absolute 0.5  0.1 - 1.0 (K/uL)    Eosinophils Relative 2  0 - 5 (%)    Eosinophils Absolute 0.2  0.0 - 0.7 (K/uL)    Basophils Relative 0  0 - 1 (%)    Basophils Absolute 0.0  0.0 - 0.1 (K/uL)   COMPREHENSIVE METABOLIC PANEL     Status: Abnormal   Collection Time   11/20/11 10:40 PM      Component Value Range Comment   Sodium 139  135 - 145 (mEq/L)    Potassium 3.7  3.5 - 5.1 (mEq/L)    Chloride 105  96 - 112 (mEq/L)    CO2 26  19 - 32 (mEq/L)    Glucose, Bld 119 (*) 70 - 99 (mg/dL)    BUN 18  6 - 23 (mg/dL)    Creatinine, Ser 1.61  0.50 - 1.35 (mg/dL)    Calcium 9.2  8.4 - 10.5 (mg/dL)    Total Protein 6.7  6.0 - 8.3 (g/dL)    Albumin 3.8  3.5 - 5.2 (g/dL)    AST 15  0 - 37 (U/L)    ALT 14  0 - 53 (U/L)    Alkaline Phosphatase 90  39 - 117 (U/L)    Total Bilirubin 0.3  0.3 - 1.2 (mg/dL)    GFR calc non Af Amer >90  >90 (mL/min)    GFR calc Af Amer >90  >90 (mL/min)   CK TOTAL AND CKMB      Status: Normal   Collection Time   11/20/11 10:40 PM      Component Value Range Comment   Total CK 112  7 - 232 (U/L)    CK, MB 2.8  0.3 - 4.0 (ng/mL)    Relative Index 2.5  0.0 - 2.5    TROPONIN I     Status: Normal   Collection Time   11/20/11 10:40 PM      Component Value Range Comment   Troponin I <0.30  <0.30 (ng/mL)   POCT I-STAT, CHEM 8     Status: Abnormal   Collection Time   11/20/11 10:41 PM      Component Value Range Comment   Sodium 143  135 - 145 (mEq/L)    Potassium 3.8  3.5 - 5.1 (mEq/L)    Chloride 106  96 - 112 (mEq/L)    BUN 19  6 - 23 (mg/dL)    Creatinine, Ser 0.96  0.50 - 1.35 (mg/dL)    Glucose, Bld 045 (*) 70 - 99 (mg/dL)    Calcium, Ion 4.09  1.12 - 1.32 (mmol/L)    TCO2 24  0 - 100 (mmol/L)    Hemoglobin 11.9 (*) 13.0 - 17.0 (g/dL)    HCT 81.1 (*) 91.4 - 52.0 (%)   GLUCOSE, CAPILLARY     Status: Abnormal   Collection Time   11/20/11 10:57 PM      Component Value Range Comment   Glucose-Capillary 115 (*) 70 - 99 (mg/dL)   AMMONIA     Status: Normal   Collection Time   11/20/11 11:08 PM      Component Value Range Comment   Ammonia 24  11 - 60 (umol/L)   URINE RAPID DRUG SCREEN (HOSP PERFORMED)     Status: Normal   Collection Time   11/21/11 12:05 AM      Component Value Range Comment   Opiates NONE DETECTED  NONE DETECTED  Cocaine NONE DETECTED  NONE DETECTED     Benzodiazepines NONE DETECTED  NONE DETECTED     Amphetamines NONE DETECTED  NONE DETECTED     Tetrahydrocannabinol NONE DETECTED  NONE DETECTED     Barbiturates NONE DETECTED  NONE DETECTED     Ct Head Wo Contrast  11/20/2011  *RADIOLOGY REPORT*  Clinical Data: Sudden onset of aphasia the.  Altered mental status.  CT HEAD WITHOUT CONTRAST  Technique:  Contiguous axial images were obtained from the base of the skull through the vertex without contrast.  Comparison: CT head 06/05/2011.  MRI brain 06/05/2011.  Findings: The ventricles and sulci appear symmetrical.  No mass effect or midline  shift.  No ventricular dilatation.  No abnormal extra-axial fluid collections.  The gray-white matter junctions are distinct.  The basal cisterns are not effaced.  No evidence of acute intracranial hemorrhage.  No depressed skull fractures. Visualized paranasal sinuses and mastoid air cells are not opacified.  No significant changes since the previous study.  IMPRESSION: No acute intracranial abnormalities.  Negative code stroke results were telephoned to Dr. Judd Lien at the time of dictation, 2251 hours on 11/20/2011.  Original Report Authenticated By: Marlon Pel, M.D.    Review of Systems  Constitutional: Negative.   HENT: Negative.   Eyes: Negative.   Respiratory: Negative.   Cardiovascular: Positive for chest pain.  Gastrointestinal: Negative.   Genitourinary: Negative.   Musculoskeletal: Negative.   Skin: Negative.   Neurological: Positive for speech change (expressive aphasia.).       Left side weakness.  Psychiatric/Behavioral: Negative.     Blood pressure 162/94, pulse 74, temperature 98.4 F (36.9 C), temperature source Oral, resp. rate 15, SpO2 95.00%. Physical Exam  Constitutional: He appears well-developed and well-nourished. No distress.  HENT:  Head: Normocephalic and atraumatic.  Right Ear: External ear normal.  Left Ear: External ear normal.  Eyes: Conjunctivae are normal. Pupils are equal, round, and reactive to light. Right eye exhibits no discharge. Left eye exhibits no discharge. No scleral icterus.  Neck: Normal range of motion. Neck supple.  Cardiovascular: Normal rate and regular rhythm.   Respiratory: Effort normal and breath sounds normal. No respiratory distress. He has no wheezes. He has no rales.  GI: Soft. Bowel sounds are normal. He exhibits no distension. There is no tenderness. There is no rebound.  Musculoskeletal: Normal range of motion. He exhibits no edema and no tenderness.  Neurological: He is alert.       3/5 in upper and lower  extremities. 5/5 in the right upper and lower extremity. No facial asymmetry.  Skin: Skin is warm and dry. He is not diaphoretic.     Assessment/Plan #1. Chest pain history of CAD status post stenting - cycle cardiac markers. Continue aspirin and Plavix. Place patient on nitroglycerin paste. Follow chest x-ray. #2. Aphasia with history of previous stroke and mild left-sided weakness - please read neurology notes. Per neurologist patient may have encephalopathy. MRI of brain has been ordered. Patient will be kept on neuro checks and swallow evaluation.  CODE STATUS - full code.  Leea Rambeau N. 11/21/2011, 1:23 AM Addendum - patient had persistent chest pain and IV nitroglycerin infusion has been started. I did discuss with on-call cardiologist Dr. Gwendalyn Ege. The cardiologist advised to cycle cardiac markers and call back if it's positive. Patient will be transferred to step down unit due to persistent pain.  Midge Minium.

## 2011-11-21 NOTE — ED Notes (Signed)
Pt following command, moving all ext but left side grip weaker than right grip, pupils are equal and reactive to light.

## 2011-11-21 NOTE — ED Provider Notes (Signed)
I saw and evaluated the patient, reviewed the resident's note and I agree with the findings and plan.  I saw the patient along with Ihor Austin and agree with his note, assessment, and plan.  The patient was brought to the ED by EMS for eval of mental status change.  He was at home and became suddenly unable to respond.  He did not lose consciousness, but has been unable follow commands.  He has a history of some sort of cva in the past and I am told his left side is weak at the baseline.  On exam, vitals are stable and the patient is afebrile.  The heart and lung exam is unremarkable.  Neurologically, the patient is difficult to assess due to his apparent inability to process what is being said to him.  He is awake and alert, but will not speak or follow any commands.  He appears to move all four extremities.    He arrived to the ED as a Code Stroke.  He was evaluated immediately upon arrival, labs were obtained, and he was taken to CT for a scan of the head.  This was unremarkable.  He was seen by neurology who did not believe he was a candidate for tpa.  The family reported to Neurology there was some sort of chest pain that occurred prior to this episode and wanted the patient admitted to the internal medicine service.  [facility] was consulted and will come admit the patient.    CRITICAL CARE Performed by: Geoffery Lyons   Total critical care time: 30 minutes.  Critical care time was exclusive of separately billable procedures and treating other patients.  Critical care was necessary to treat or prevent imminent or life-threatening deterioration.  Critical care was time spent personally by me on the following activities: development of treatment plan with patient and/or surrogate as well as nursing, discussions with consultants, evaluation of patient's response to treatment, examination of patient, obtaining history from patient or surrogate, ordering and performing treatments and interventions, ordering  and review of laboratory studies, ordering and review of radiographic studies, pulse oximetry and re-evaluation of patient's condition.  Geoffery Lyons, MD 11/21/11 1229

## 2011-11-22 ENCOUNTER — Inpatient Hospital Stay (HOSPITAL_COMMUNITY): Payer: Non-veteran care

## 2011-11-22 DIAGNOSIS — R569 Unspecified convulsions: Secondary | ICD-10-CM

## 2011-11-22 DIAGNOSIS — G459 Transient cerebral ischemic attack, unspecified: Secondary | ICD-10-CM | POA: Diagnosis present

## 2011-11-22 DIAGNOSIS — I2510 Atherosclerotic heart disease of native coronary artery without angina pectoris: Secondary | ICD-10-CM

## 2011-11-22 DIAGNOSIS — R079 Chest pain, unspecified: Secondary | ICD-10-CM

## 2011-11-22 DIAGNOSIS — R4701 Aphasia: Secondary | ICD-10-CM

## 2011-11-22 DIAGNOSIS — E869 Volume depletion, unspecified: Secondary | ICD-10-CM | POA: Diagnosis present

## 2011-11-22 DIAGNOSIS — G450 Vertebro-basilar artery syndrome: Secondary | ICD-10-CM | POA: Diagnosis present

## 2011-11-22 MED ORDER — ASPIRIN EC 81 MG PO TBEC
81.0000 mg | DELAYED_RELEASE_TABLET | Freq: Every day | ORAL | Status: DC
  Administered 2011-11-22 – 2011-11-23 (×2): 81 mg via ORAL
  Filled 2011-11-22 (×2): qty 1

## 2011-11-22 NOTE — Progress Notes (Signed)
[facility] [place] TEAM 1 - Stepdown/ICU TEAM  Subjective: Much more alert today. Patient continues to complain of headache and "fuzzy headedness". He also endorses issues with chronic dizziness special position changes and apparently has been previously diagnosed with vertebrobasilar insufficiency since the onset of his cardiovascular problems dating back to September 2012.  Wife descirbes episodes intermittently in last few months where pt "just stares off into space and won't respond."  Objective: Vital signs in last 24 hours: Temp:  [97.9 F (36.6 C)-98.8 F (37.1 C)] 97.9 F (36.6 C) (05/29 1156) Pulse Rate:  [58-85] 64  (05/29 1156) Resp:  [12-21] 16  (05/29 1156) BP: (109-146)/(65-87) 146/87 mmHg (05/29 1156) SpO2:  [92 %-99 %] 94 % (05/29 1156) Weight change:    Intake/Output from previous day: 05/28 0701 - 05/29 0700 In: 825 [I.V.:825] Out: 1500 [Urine:1500] Intake/Output this shift: Total I/O In: 660 [P.O.:360; I.V.:300] Out: 1500 [Urine:1500]  General appearance: alert, cooperative, appears older than stated age and mild distress Resp: clear to auscultation bilaterally-no wheeze or focal crackles Cardio: regular rate and sinus rhythm, S1, S2 normal, no murmur, click, rub or gallop GI: soft, non-tender; bowel sounds normal; no masses,  no organomegaly Extremities: extremities normal, atraumatic, no cyanosis or edema Neurologic: Awake but slow to respond verbally due to chronic aphasia, slightly weaker on the left side which is at baseline  Lab Results:  Basename 11/21/11 0544 11/20/11 2241 11/20/11 2240  WBC 6.2 -- 7.0  HGB 11.7* 11.9* --  HCT 32.9* 35.0* --  PLT 149* -- 174   BMET  Basename 11/21/11 0544 11/20/11 2241 11/20/11 2240  NA 143 143 --  K 4.1 3.8 --  CL 109 106 --  CO2 26 -- 26  GLUCOSE 130* 122* --  BUN 16 19 --  CREATININE 0.70 0.70 --  CALCIUM 8.6 -- 9.2    Studies/Results: Ct Head Wo Contrast  11/20/2011  *RADIOLOGY REPORT*   Clinical Data: Sudden onset of aphasia the.  Altered mental status.  CT HEAD WITHOUT CONTRAST  Technique:  Contiguous axial images were obtained from the base of the skull through the vertex without contrast.  Comparison: CT head 06/05/2011.  MRI brain 06/05/2011.  Findings: The ventricles and sulci appear symmetrical.  No mass effect or midline shift.  No ventricular dilatation.  No abnormal extra-axial fluid collections.  The gray-white matter junctions are distinct.  The basal cisterns are not effaced.  No evidence of acute intracranial hemorrhage.  No depressed skull fractures. Visualized paranasal sinuses and mastoid air cells are not opacified.  No significant changes since the previous study.  IMPRESSION: No acute intracranial abnormalities.  Negative code stroke results were telephoned to Dr. Judd Lien at the time of dictation, 2251 hours on 11/20/2011.  Original Report Authenticated By: Marlon Pel, M.D.   Mri Brain Without Contrast  11/21/2011  *RADIOLOGY REPORT*  Clinical Data: Aphasia.  Left-sided weakness.  MRI HEAD WITHOUT CONTRAST  Technique:  Multiplanar, multiecho pulse sequences of the brain and surrounding structures were obtained according to standard protocol without intravenous contrast.  Comparison: 11/20/2011 head CT.  06/06/2011 brain MR.  Findings: No acute infarct.  No intracranial hemorrhage.  No intracranial mass lesion detected on this unenhanced exam.  Mild small vessel disease type changes.  Mild global atrophy without hydrocephalus.  Major intracranial vascular structures are patent.  Minimal paranasal sinus mucosal thickening.  Partially empty sella incidentally noted.  IMPRESSION: No acute infarct.  Please see above.  Original Report Authenticated By: Viviann Spare  R. Constance Goltz, M.D.   Dg Chest Portable 1 View  11/22/2011  *RADIOLOGY REPORT*  Clinical Data: Chest pain.  PORTABLE CHEST - 1 VIEW  Comparison: 06/06/2011.  Findings: Cardiomegaly.  Calcified tortuous aorta.  Mild vascular  congestion. Mild subsegmental atelectasis.  Slight left pleural effusion.  No overt infiltrate or failure.  IMPRESSION: Cardiomegaly without infiltrates or pulmonary edema. Slight left pleural effusion.  Mild subsegmental atelectasis.  Original Report Authenticated By: Elsie Stain, M.D.   Dg Chest Port 1 View  11/21/2011  *RADIOLOGY REPORT*  Clinical Data: Chest pain.  PORTABLE CHEST - 1 VIEW  Comparison: Chest x-ray 06/06/2011.  Findings: There are patchy bibasilar opacities (left greater than right), favored to represent areas of subsegmental atelectasis. Blunting of the left costophrenic sulcus also suggest a small left- sided pleural effusion.  Pulmonary venous congestion without frank pulmonary edema.  Heart size is borderline enlarged. The patient is rotated to the right on today's exam, resulting in distortion of the mediastinal contours and reduced diagnostic sensitivity and specificity for mediastinal pathology.  Atherosclerotic calcifications within the arch of the aorta.  IMPRESSION: 1.  Low lung volumes with probable bibasilar subsegmental atelectasis and small left-sided pleural effusion. 2.  Pulmonary venous congestion. 3.  Atherosclerosis.  Original Report Authenticated By: Florencia Reasons, M.D.   Medications: I have reviewed the patient's current medications.  Assessment/Plan:  Chest pain/ CAD (coronary artery disease)/acute musculoskeletal chest pain *EKG has been negative-cardiac isoenzymes x3 collections have been negative *Has apparent history of prior coronary intervention with stent *Cardiology feels that patient's pain is musculoskeletal in etiology given the fact he recently had fallen while assisting a family member on a boat and during that fall he hit the left anterior chest wall *Recommendation is to continue aspirin at 81 mg as well as Plavix  Aphasia/Metabolic encephalopathy *Has chronic aphasia which acutely worsened after patient developed chest pain but has  subsequently improved since admission *CT of the head negative for acute CVA-  MRI shows no acute process. 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 *Currently appears to be at baseline we'll continue to monitor and followup on MRI this admission *As above  Dizziness and apparent vertebrobasilar *We'll check orthostatic vital signs *PT/OT evaluation *Continue IV fluid hydration   Thrombocytopenia/Anemia *Etiology unclear-was on Plavix before admission and this could be the culprit *Also presented with altered mentation and could have a gram-negative infection such as urinary tract infection *Was on Motrin prior to admission this could also contribute to tell the cytopenia as well as blood loss anemia from the GI tract-will check stools for blood and an anemia panel   Disposition *Transfer to medical floor   LOS: 2 days   Junious Silk, ANP pager [phone]  [facility] [email or web address] Password: [email or web address]  11/22/2011, 2:26 PM  I have personally examined this patient and reviewed the entire database. I have reviewed the above note, made any necessary editorial changes, and agree with its content.  Lonia Blood, MD [facility]

## 2011-11-22 NOTE — Progress Notes (Signed)
Portable EEG completed

## 2011-11-22 NOTE — Progress Notes (Signed)
[facility] Resident Note  Subjective:  Slept good last night.  Pain in left chest is "some better" but still hurts occasionally especially after he touches the area.  Hasn't been up out of bed.  Foley out this morning.    Objective:  Vital Signs in the last 24 hours: Filed Vitals:   11/21/11 2000 11/21/11 2034 11/22/11 0020 11/22/11 0425  BP:  127/75 109/65 115/75  Pulse:  68 73 68  Temp: 98 F (36.7 C)     TempSrc: Oral     Resp: 12 14 14 14   Height:      Weight:      SpO2: 97% 96% 94% 96%   Intake/Output from previous day: 05/28 0701 - 05/29 0700 In: 750 [I.V.:750] Out: 1500 [Urine:1500]  24-hour weight change: Weight change:   Weight trends: Filed Weights   11/21/11 0345  Weight: 226 lb 10.1 oz (102.8 kg)   Physical Exam: Vitals reviewed. General: resting in bed, NAD HEENT: PERRL, EOMI, no scleral icterus Cardiac: RRR, no rubs, murmurs or gallops, mild tenderness to palpation over the left chest and pectoris muscle.  Area is mildly swollen compared to right side.  Pulm: clear to auscultation bilaterally, no wheezes, rales, or rhonchi Abd: soft, nontender, nondistended, BS present Ext: warm and well perfused, no pedal edema Neuro: alert and oriented X3, cranial nerves II-XII grossly intact, strength and sensation to light touch equal in bilateral upper and lower extremities  Lab Results:  Basename 11/21/11 0544 11/20/11 2241 11/20/11 2240  WBC 6.2 -- 7.0  HGB 11.7* 11.9* --  PLT 149* -- 174    Basename 11/21/11 0544 11/20/11 2241 11/20/11 2240  NA 143 143 --  K 4.1 3.8 --  CL 109 106 --  CO2 26 -- 26  GLUCOSE 130* 122* --  BUN 16 19 --  CREATININE 0.70 0.70 --    Basename 11/21/11 1859 11/21/11 1044  TROPONINI <0.30 <0.30    Basename 11/21/11 2210 11/20/11 2257  GLUCAP 135* 115*   Cardiac Studies: None  Tele: Reviewed, NSR  EKG: NSR, rate of 65, no ST segment changes or T wave inversions.  Scheduled Meds:   . aspirin   325 mg Oral Daily  . clonazePAM  1 mg Oral QHS  . clopidogrel  75 mg Oral Daily  . cyclobenzaprine  5 mg Oral TID  . enoxaparin  40 mg Subcutaneous Q24H  . gabapentin  300 mg Oral BID  . nitroGLYCERIN  0.4 mg Transdermal Daily  . sertraline  200 mg Oral Daily  . simvastatin  10 mg Oral q1800  . DISCONTD: aspirin  325 mg Oral Once  . DISCONTD: ibuprofen  600 mg Oral TID   Continuous Infusions:   . sodium chloride 75 mL/hr at 11/22/11 0426  . DISCONTD: nitroGLYCERIN 40 mcg/min (11/21/11 0221)   PRN Meds:.acetaminophen, ibuprofen, morphine injection, ondansetron  Imaging: Ct Head Wo Contrast  11/20/2011  *RADIOLOGY REPORT*  Clinical Data: Sudden onset of aphasia the.  Altered mental status.  CT HEAD WITHOUT CONTRAST  Technique:  Contiguous axial images were obtained from the base of the skull through the vertex without contrast.  Comparison: CT head 06/05/2011.  MRI brain 06/05/2011.  Findings: The ventricles and sulci appear symmetrical.  No mass effect or midline shift.  No ventricular dilatation.  No abnormal extra-axial fluid collections.  The gray-white matter junctions are distinct.  The basal cisterns are not effaced.  No evidence of acute intracranial hemorrhage.  No depressed  skull fractures. Visualized paranasal sinuses and mastoid air cells are not opacified.  No significant changes since the previous study.  IMPRESSION: No acute intracranial abnormalities.  Negative code stroke results were telephoned to Dr. Judd Lien at the time of dictation, 2251 hours on 11/20/2011.  Original Report Authenticated By: Marlon Pel, M.D.   Mri Brain Without Contrast  11/21/2011  *RADIOLOGY REPORT*  Clinical Data: Aphasia.  Left-sided weakness.  MRI HEAD WITHOUT CONTRAST  Technique:  Multiplanar, multiecho pulse sequences of the brain and surrounding structures were obtained according to standard protocol without intravenous contrast.  Comparison: 11/20/2011 head CT.  06/06/2011 brain MR.   Findings: No acute infarct.  No intracranial hemorrhage.  No intracranial mass lesion detected on this unenhanced exam.  Mild small vessel disease type changes.  Mild global atrophy without hydrocephalus.  Major intracranial vascular structures are patent.  Minimal paranasal sinus mucosal thickening.  Partially empty sella incidentally noted.  IMPRESSION: No acute infarct.  Please see above.  Original Report Authenticated By: Fuller Canada, M.D.   Dg Chest Portable 1 View  11/22/2011  *RADIOLOGY REPORT*  Clinical Data: Chest pain.  PORTABLE CHEST - 1 VIEW  Comparison: 06/06/2011.  Findings: Cardiomegaly.  Calcified tortuous aorta.  Mild vascular congestion. Mild subsegmental atelectasis.  Slight left pleural effusion.  No overt infiltrate or failure.  IMPRESSION: Cardiomegaly without infiltrates or pulmonary edema. Slight left pleural effusion.  Mild subsegmental atelectasis.  Original Report Authenticated By: Elsie Stain, M.D.   Dg Chest Port 1 View  11/21/2011  *RADIOLOGY REPORT*  Clinical Data: Chest pain.  PORTABLE CHEST - 1 VIEW  Comparison: Chest x-ray 06/06/2011.  Findings: There are patchy bibasilar opacities (left greater than right), favored to represent areas of subsegmental atelectasis. Blunting of the left costophrenic sulcus also suggest a small left- sided pleural effusion.  Pulmonary venous congestion without frank pulmonary edema.  Heart size is borderline enlarged. The patient is rotated to the right on today's exam, resulting in distortion of the mediastinal contours and reduced diagnostic sensitivity and specificity for mediastinal pathology.  Atherosclerotic calcifications within the arch of the aorta.  IMPRESSION: 1.  Low lung volumes with probable bibasilar subsegmental atelectasis and small left-sided pleural effusion. 2.  Pulmonary venous congestion. 3.  Atherosclerosis.  Original Report Authenticated By: Florencia Reasons, M.D.   Assessment/Plan:  Pt is a 66 y.o. yo male with  a PMHx noted above who was admitted on 11/20/2011 for chest pain and slurred speech.   1. Musculoskeletal Chest pain: Pain is completely reproducible to palpation and with a normal EKG and 3 sets of negative cardiac enzymes, ACS is completely ruled out.  He should continue on his dual anti-platelet therapy for at least 1 year with Plavix and ASA 81 mg.  He should also continue his pravastatin as an outpatient to maintain his cholesterol.  His A1C is excellent.  We would suggest a short course (7-10 days) of NSAID therapy for the left chest pain as well as warm compresses to help with the healing process.   While he would ideally be on a small amount of beta blocker and ACE-I his blood pressure and pulse have very little room to add these currently.  From a cardiology standpoint he is okay to transfer out of the SDU and could consider discharge soon.  Length of Stay: 2 days  Patient history and plan of care reviewed with attending, Dr. Darra Lis, M.D. 11/22/2011, 6:35 AM Pt seen and examined  unklikely cardiac  pain,  Would reduce ASA325>>81 and consider PT for shoulder mobility Will sign off

## 2011-11-22 NOTE — Procedures (Signed)
EEG NUMBER:  [id number].  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  MEDICATIONS:  Gabapentin, clonazepam, as well as cyclobenzaprine.  The EEG was done with the patient awake and drowsy.  During periods of maximal wakefulness, he had a low amplitude posterior dominant rhythm of 8-9 cycles per second.  That was well regulated and well sustained. Background activities are composed.  Low-amplitude beta activities that were symmetric.  Hyperventilation and photic stimulation were not performed.  The patient did become drowsy as characterized by an attenuation of the posterior dominant rhythm as well as muscle activity.  There were bursts of slower theta activities.  Vertex sharp waves that were symmetric were seen.  Stage II sleep has not reached.  CLINICAL INTERPRETATION:  This routine EEG done with the patient awake and drowsy is normal.          ______________________________ Denton Meek, MD    [id number] D:  11/22/2011 22:06:35  T:  11/22/2011 91:47:82  Job #:  [id number]

## 2011-11-23 DIAGNOSIS — R4701 Aphasia: Secondary | ICD-10-CM

## 2011-11-23 DIAGNOSIS — R079 Chest pain, unspecified: Secondary | ICD-10-CM

## 2011-11-23 DIAGNOSIS — I2510 Atherosclerotic heart disease of native coronary artery without angina pectoris: Secondary | ICD-10-CM

## 2011-11-23 LAB — CBC
Hemoglobin: 12.6 g/dL — ABNORMAL LOW (ref 13.0–17.0)
MCH: 30.7 pg (ref 26.0–34.0)
MCHC: 36.1 g/dL — ABNORMAL HIGH (ref 30.0–36.0)
MCV: 85.1 fL (ref 78.0–100.0)
Platelets: 156 10*3/uL (ref 150–400)

## 2011-11-23 LAB — URINE CULTURE

## 2011-11-23 MED ORDER — ACETAMINOPHEN 325 MG PO TABS: 650.0000 mg | ORAL_TABLET | ORAL | Status: AC | PRN

## 2011-11-23 NOTE — Evaluation (Signed)
Occupational Therapy Evaluation Patient Details Name: [name] [name] MRN: [id number] DOB: Apr 12, 1946 Today's Date: 11/23/2011 Time: 4098-1191 OT Time Calculation (min): 42 min  OT Assessment / Plan / Recommendation Clinical Impression  This 66 y.o. male admitted for possible TIA.  Pt is familiar to OT from previous admissions.  Pt and wife eager to discharge home, and wife feels comfortable assisting pt at discharge.  They have all DME.  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  No further needs at this time.    OT Assessment  Patient does not need any further OT services    Follow Up Recommendations  No OT follow up    Barriers to Discharge      Equipment Recommendations  None recommended by OT    Recommendations for Other Services    Frequency       Precautions / Restrictions Precautions Precautions: Fall Restrictions Weight Bearing Restrictions: No       ADL  Eating/Feeding: Simulated;Independent Where Assessed - Eating/Feeding: Edge of bed Grooming: Simulated;Wash/dry hands;Wash/dry face;Min guard Where Assessed - Grooming: Unsupported standing Upper Body Bathing: Simulated;Set up Where Assessed - Upper Body Bathing: Unsupported sitting Lower Body Bathing: Simulated;Min guard Where Assessed - Lower Body Bathing: Unsupported sit to stand Upper Body Dressing: Simulated;Set up Where Assessed - Upper Body Dressing: Unsupported sitting Lower Body Dressing: Simulated;Min guard Where Assessed - Lower Body Dressing: Unsupported sit to stand Toilet Transfer: Simulated;Min guard Toilet Transfer Method: Sit to Barista: Comfort height toilet Toileting - Clothing Manipulation and Hygiene: Simulated;Min guard Where Assessed - Engineer, mining and Hygiene: Standing Equipment Used: Gait belt Transfers/Ambulation Related to ADLs: Pt. ambulates with min  guard assist to min A - mild balance loss ADL Comments: Pt. reports he got dressed this morning.  Pt for discharge today.  Wife able to provide adequate assist at discharge.  Pt. with complaint pain Lt. shoulder with ROM - reports recent fall and edema Lt. chest.  Instructed pt in HEP for Lt. UE and instructed he and wife to discuss OT referral with MD if shoulder not improved in four weeks.  Also recommend OPPT for balance and neuro-ophthalmology due to persisitant diplopia from previous CVA.  Pt and wife were provided with information  for this and are going to speak with PCP at [place].   Instructed pt. to sit to shower and no driving.  He and wife agree    OT Diagnosis:    OT Problem List:   OT Treatment Interventions:     OT Goals    Visit Information  Last OT Received On: 11/23/11 Assistance Needed: +1 PT/OT Co-Evaluation/Treatment: Yes    Subjective Data  Subjective: I had just started driving again Patient Stated Goal: To go hom   Prior Functioning  Home Living Lives With: Spouse Available Help at Discharge: Family Type of Home: House Bathroom Shower/Tub: Walk-in Stage manager: Standard Bathroom Accessibility: Yes How Accessible: Accessible via walker Home Adaptive Equipment: Shower chair with back Prior Function Level of Independence: Independent Able to Take Stairs?: Yes Driving: Yes Vocation: Retired Musician: No difficulties Dominant Hand: Right    Cognition  Overall Cognitive Status: Appears within functional limits for tasks assessed/performed Arousal/Alertness: Awake/alert Orientation Level: Appears intact for tasks assessed Behavior During Session: [facility] for tasks performed    Extremity/Trunk Assessment Right Upper Extremity Assessment RUE ROM/Strength/Tone: Within functional levels RUE Sensation: WFL - Light Touch RUE  Coordination: WFL - gross/fine motor Left Upper Extremity Assessment LUE ROM/Strength/Tone: Deficits;Due to  pain LUE ROM/Strength/Tone Deficits: limited to 80-90* shoulder flexion and scaption due to pain LUE Coordination: Deficits   Mobility Transfers Sit to Stand: 4: Min guard;From bed Stand to Sit: 4: Min guard;To bed   Exercise    Balance    End of Session OT - End of Session Equipment Utilized During Treatment: Gait belt Activity Tolerance: Patient tolerated treatment well Patient left: in bed;with call bell/phone within reach;with family/visitor present Nurse Communication:  (results of eval)   [name] [name], [name] [name] 11/23/2011, 12:59 PM

## 2011-11-23 NOTE — Evaluation (Signed)
Physical Therapy Evaluation Patient Details Name: [name] [name] MRN: [id number] DOB: [birth year] Today's Date: 11/23/2011 Time: 4098-1191 PT Time Calculation (min): 42 min  PT Assessment / Plan / Recommendation Clinical Impression  pt presents with R/O CVA and with recent fall causing L sided Chest Pain.  pt mildly unsteady and would benefit from OPPT to improve balance and safety.  [facility] so pt given [facility] number for Outpatient Neuro and is to request his primary MD to [facility] with script for OPPT.      PT Assessment  All further PT needs can be met in the next venue of care    Follow Up Recommendations  Outpatient PT    Barriers to Discharge        lEquipment Recommendations  None recommended by PT    Recommendations for Other Services     Frequency      Precautions / Restrictions Precautions Precautions: Fall Restrictions Weight Bearing Restrictions: No   Pertinent Vitals/Pain None      Mobility  Bed Mobility Bed Mobility: Not assessed Transfers Transfers: Sit to Stand;Stand to Sit Sit to Stand: 4: Min guard;With upper extremity assist;From bed Stand to Sit: 4: Min guard;With upper extremity assist;To bed Details for Transfer Assistance: pt demos good technique, mild lean L.   Ambulation/Gait Ambulation/Gait Assistance: 4: Min guard Ambulation Distance (Feet): 200 Feet Assistive device: Straight cane Ambulation/Gait Assistance Details: Demos good technique with cane.  Mild instability noted in L knee Gait Pattern: Step-through pattern;Decreased stride length Stairs: No Wheelchair Mobility Wheelchair Mobility: No Modified Rankin (Stroke Patients Only) Pre-Morbid Rankin Score: No symptoms Modified Rankin: Moderate disability    Exercises     PT Diagnosis: Difficulty walking  PT Problem List: Decreased strength;Decreased activity tolerance;Decreased balance;Decreased mobility;Decreased coordination;Decreased knowledge of use of DME PT Treatment  Interventions:     PT Goals    Visit Information  Last PT Received On: 11/23/11 Assistance Needed: +1 PT/OT Co-Evaluation/Treatment: Yes    Subjective Data  Subjective: He's at about 80% of normal per wife.   Patient Stated Goal: Boating   Prior Functioning  Home Living Lives With: Spouse Available Help at Discharge: Family Type of Home: House Home Access: Stairs to enter Secretary/administrator of Steps: 1 Entrance Stairs-Rails: None Home Layout: One level Bathroom Shower/Tub: Health visitor: Pharmacist, community: Yes How Accessible: Accessible via walker Home Adaptive Equipment: Shower chair with back Prior Function Level of Independence: Independent Able to Take Stairs?: Yes Driving: Yes Vocation: Retired Musician: No difficulties Dominant Hand: Right    Cognition  Overall Cognitive Status: Appears within functional limits for tasks assessed/performed Arousal/Alertness: Awake/alert Orientation Level: Appears intact for tasks assessed Behavior During Session: [facility] for tasks performed    Extremity/Trunk Assessment Right Upper Extremity Assessment RUE ROM/Strength/Tone: Within functional levels RUE Sensation: WFL - Light Touch RUE Coordination: WFL - gross/fine motor Left Upper Extremity Assessment LUE ROM/Strength/Tone: Deficits;Due to pain LUE ROM/Strength/Tone Deficits: limited to 80-90* shoulder flexion and scaption due to pain LUE Coordination: Deficits Right Lower Extremity Assessment RLE ROM/Strength/Tone: WFL for tasks assessed RLE Sensation: WFL - Light Touch Left Lower Extremity Assessment LLE ROM/Strength/Tone: Deficits LLE ROM/Strength/Tone Deficits: Strength Grossly 4/5 LLE Sensation: WFL - Light Touch   Balance Balance Balance Assessed: No  End of Session PT - End of Session Equipment Utilized During Treatment: Gait belt Activity Tolerance: Patient tolerated treatment well Patient left: in  bed;with call bell/phone within reach;with family/visitor present (Sitting EOB) Nurse Communication: Mobility  status   [name] [name], [name] [name], [name] [name] [phone] 11/23/2011, 1:26 PM

## 2011-11-23 NOTE — Discharge Instructions (Signed)
STROKE/TIA DISCHARGE INSTRUCTIONS SMOKING Cigarette smoking nearly doubles your risk of having a stroke & is the single most alterable risk factor  If you smoke or have smoked in the last 12 months, you are advised to quit smoking for your health.  Most of the excess cardiovascular risk related to smoking disappears within a year of stopping.  Ask you doctor about anti-smoking medications  [place] Quit Line: 1-800-QUIT NOW  Free Smoking Cessation Classes [phone]  CHOLESTEROL Know your levels; limit fat & cholesterol in your diet  Lipid Panel     Component Value Date/Time   CHOL 154 11/21/2011 0544   TRIG 100 11/21/2011 0544   HDL 41 11/21/2011 0544   CHOLHDL 3.8 11/21/2011 0544   VLDL 20 11/21/2011 0544   LDLCALC 93 11/21/2011 0544      Many patients benefit from treatment even if their cholesterol is at goal.  Goal: Total Cholesterol (CHOL) less than 160  Goal:  Triglycerides (TRIG) less than 150  Goal:  HDL greater than 40  Goal:  LDL (LDLCALC) less than 100   BLOOD PRESSURE American Stroke Association blood pressure target is less that 120/80 mm/Hg  Your discharge blood pressure is:  BP: 100/66 mmHg  Monitor your blood pressure  Limit your salt and alcohol intake  Many individuals will require more than one medication for high blood pressure  DIABETES (A1c is a blood sugar average for last 3 months) Goal HGBA1c is under 7% (HBGA1c is blood sugar average for last 3 months)  Diabetes: {STROKE DC DIABETES:22357}    Lab Results  Component Value Date   HGBA1C 5.8* 06/06/2011     Your HGBA1c can be lowered with medications, healthy diet, and exercise.  Check your blood sugar as directed by your physician  Call your physician if you experience unexplained or low blood sugars.  PHYSICAL ACTIVITY/REHABILITATION Goal is 30 minutes at least 4 days per week    {STROKE DC ACTIVITY/REHAB:22359}  Activity decreases your risk of heart attack and stroke and makes your heart  stronger.  It helps control your weight and blood pressure; helps you relax and can improve your mood.  Participate in a regular exercise program.  Talk with your doctor about the best form of exercise for you (dancing, walking, swimming, cycling).  DIET/WEIGHT Goal is to maintain a healthy weight  Your discharge diet is: NPO liquids Your height is:  Height: 5\' 10"  (177.8 cm) Your current weight is: Weight: 102.8 kg (226 lb 10.1 oz) Your Body Mass Index (BMI) is:  BMI (Calculated): 32.6   Following the type of diet specifically designed for you will help prevent another stroke.  Your goal weight range is:  Your goal Body Mass Index (BMI) is 19-24.  Healthy food habits can help reduce 3 risk factors for stroke:  High cholesterol, hypertension, and excess weight.  RESOURCES Stroke/Support Group:  Call [phone]  they meet the 3rd Sunday of the month on the Rehab Unit at [facility], [place] ( no meetings June, July & Aug).  STROKE EDUCATION PROVIDED/REVIEWED AND GIVEN TO PATIENT Stroke warning signs and symptoms How to activate emergency medical system (call 911). Medications prescribed at discharge. Need for follow-up after discharge. Personal risk factors for stroke. Pneumonia vaccine given:   {STROKE DC YES/NO/DATE:22363} Flu vaccine given:   {STROKE DC YES/NO/DATE:22363} My questions have been answered, the writing is legible, and I understand these instructions.  I will adhere to these goals & educational materials that have been provided to  me after my discharge from the hospital.

## 2011-11-23 NOTE — Discharge Summary (Signed)
Physician Discharge Summary  [name] [name]:[id number] DOB: 27-Sep-1945 DOA: 11/20/2011  PCP: No primary provider on file.  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  Metabolic encephalopathy  Vertebrobasilar artery insufficiency  TIA (transient ischemic attack)  Volume depletion   Discharge Condition: improved.  Disposition: Needs to follow up with neurologist out patient. Needs repeat UA to document resolution of hematuria.   History of present illness:  66 year old male with known history of CAD status post stenting, coronary artery dissection during cardiac catheter, CVA had experienced chest pain last evening around 3 PM after he pushed it regular. Subsequent to that he felt palpitations, chest pain which improved with rest. Later in the evening around 8.30 PM patient started having chest pain again. Patient's wife gave him nitroglycerin twice sublingual despite the chest pain persisted. EMS was called and aspirin was given. Once EMS reached patient also became aphasic and was having difficulty moving the left side both upper and lower extremity. Initially patient was brought as a code stroke. CT head is negative for anything acute. Neurologist on-call was consulted. As per the neurologist it was felt patient did not have a stroke and code stroke was canceled. Patient's left upper and lower extremity strengths started improving but patient still aphasic but able to follow commands.   Hospital Course:   Chest pain/ CAD (coronary artery disease)/acute musculoskeletal chest pain  EKG has been negative-cardiac isoenzymes x3 collections have been negative . Has apparent history of prior coronary intervention with stent  Cardiology feels that patient's pain is musculoskeletal in  etiology given the fact he recently had fallen while assisting a family member on a boat and during that fall he hit the left anterior chest wall. Recommendation is to continue aspirin at 81 mg as well as Plavix. Tylenol for pain as needed.   Aphasia/Metabolic encephalopathy  *Has chronic aphasia which acutely worsened after patient developed chest pain but has subsequently improved since admission.  *CT of the head negative for acute CVA- MRI shows no acute process. MRA from December 2012 shows no evidence of cerebrovascular compromise appear  *Question of absence seizures. EEG negative. Advised patient to follow up with neurologist at [facility] center.  *Bedside swallowing evaluation completed and patient appropriate for D3 diet  *Continue cognitive linguistic therapy while here and recommendation is to continue at home after discharge.  Microscopic hematuria  *Likely secondary to specimen being obtained with Foley catheter in place  *Patient denies gross hematuria prior to admission. Needs repeated UA to document resolution.   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 MRI this admission negative for acute stroke.    Dizziness and apparent vertebrobasilar  *orthostatic vital signs negative.  *PT/OT evaluation prior to discharge.   Thrombocytopenia/Anemia  Mild thrombocytopenia, resolved.    Discharge later today after PT evaluation.   Discharge Exam: Filed Vitals:   11/23/11 1045  BP: 135/76  Pulse: 79  Temp: 98 F (36.7 C)  Resp: 20   Filed Vitals:   11/22/11 1900 11/23/11 0217 11/23/11 0500 11/23/11 1045  BP: 115/68 124/62 104/70 135/76  Pulse: 73 58 54 79  Temp: 97.5 F (36.4 C) 98 F (36.7 C) 98.1 F (36.7 C) 98 F (36.7 C)  TempSrc: Oral Oral Oral   Resp:  16 18 19 20   Height:      Weight:      SpO2: 96% 93% 94% 95%   General: Patient sitting in chair in no distress.  Cardiovascular: S1,  S2 RRR. Respiratory: CTA.   Discharge Instructions  Discharge Orders    Future Orders Please Complete By Expires   Diet - low sodium heart healthy      Increase activity slowly        Medication List  As of 11/23/2011 10:54 AM   TAKE these medications         acetaminophen 325 MG tablet   Commonly known as: TYLENOL   Take 2 tablets (650 mg total) by mouth every 4 (four) hours as needed (temperature >/= 99.5 F).      aspirin 81 MG tablet   Take 81 mg by mouth daily.      clonazePAM 1 MG tablet   Commonly known as: KLONOPIN   Take 1 mg by mouth at bedtime.      clopidogrel 75 MG tablet   Commonly known as: PLAVIX   Take 75 mg by mouth daily.      gabapentin 100 MG capsule   Commonly known as: NEURONTIN   Take 300 mg by mouth 2 (two) times daily.      naproxen 500 MG tablet   Commonly known as: NAPROSYN   Take 500 mg by mouth 2 (two) times daily as needed. For shoulder pain      nitroGLYCERIN 0.4 MG SL tablet   Commonly known as: NITROSTAT   Place 0.4 mg under the tongue every 5 (five) minutes as needed. For chest pain      pravastatin 20 MG tablet   Commonly known as: PRAVACHOL   Take 10 mg by mouth daily.      sertraline 100 MG tablet   Commonly known as: ZOLOFT   Take 200 mg by mouth daily.      STUDY MEDICATION   Take 2 tablets by mouth 2 (two) times daily. Study med from [place]. -INV-RIVER PCI Ranolazine              The results of significant diagnostics from this hospitalization (including imaging, microbiology, ancillary and laboratory) are listed below for reference.    Significant Diagnostic Studies: Ct Head Wo Contrast  11/20/2011  *RADIOLOGY REPORT*  Clinical Data: Sudden onset of aphasia the.  Altered mental status.  CT HEAD WITHOUT CONTRAST  Technique:  Contiguous axial images were obtained from the base of the skull through the vertex without contrast.  Comparison: CT head 06/05/2011.  MRI brain 06/05/2011.  Findings: The ventricles and sulci  appear symmetrical.  No mass effect or midline shift.  No ventricular dilatation.  No abnormal extra-axial fluid collections.  The gray-white matter junctions are distinct.  The basal cisterns are not effaced.  No evidence of acute intracranial hemorrhage.  No depressed skull fractures. Visualized paranasal sinuses and mastoid air cells are not opacified.  No significant changes since the previous study.  IMPRESSION: No acute intracranial abnormalities.  Negative code stroke results were telephoned to Dr. Judd Lien at the time of dictation, 2251 hours on 11/20/2011.  Original Report Authenticated By: Marlon Pel, M.D.   Mri Brain Without Contrast  11/21/2011  *RADIOLOGY REPORT*  Clinical Data: Aphasia.  Left-sided weakness.  MRI HEAD WITHOUT CONTRAST  Technique:  Multiplanar, multiecho pulse sequences of the brain and surrounding structures were obtained according to standard protocol without intravenous contrast.  Comparison: 11/20/2011 head  CT.  06/06/2011 brain MR.  Findings: No acute infarct.  No intracranial hemorrhage.  No intracranial mass lesion detected on this unenhanced exam.  Mild small vessel disease type changes.  Mild global atrophy without hydrocephalus.  Major intracranial vascular structures are patent.  Minimal paranasal sinus mucosal thickening.  Partially empty sella incidentally noted.  IMPRESSION: No acute infarct.  Please see above.  Original Report Authenticated By: Fuller Canada, M.D.   Dg Chest Portable 1 View  11/22/2011  *RADIOLOGY REPORT*  Clinical Data: Chest pain.  PORTABLE CHEST - 1 VIEW  Comparison: 06/06/2011.  Findings: Cardiomegaly.  Calcified tortuous aorta.  Mild vascular congestion. Mild subsegmental atelectasis.  Slight left pleural effusion.  No overt infiltrate or failure.  IMPRESSION: Cardiomegaly without infiltrates or pulmonary edema. Slight left pleural effusion.  Mild subsegmental atelectasis.  Original Report Authenticated By: Elsie Stain, M.D.   Dg  Chest Port 1 View  11/21/2011  *RADIOLOGY REPORT*  Clinical Data: Chest pain.  PORTABLE CHEST - 1 VIEW  Comparison: Chest x-ray 06/06/2011.  Findings: There are patchy bibasilar opacities (left greater than right), favored to represent areas of subsegmental atelectasis. Blunting of the left costophrenic sulcus also suggest a small left- sided pleural effusion.  Pulmonary venous congestion without frank pulmonary edema.  Heart size is borderline enlarged. The patient is rotated to the right on today's exam, resulting in distortion of the mediastinal contours and reduced diagnostic sensitivity and specificity for mediastinal pathology.  Atherosclerotic calcifications within the arch of the aorta.  IMPRESSION: 1.  Low lung volumes with probable bibasilar subsegmental atelectasis and small left-sided pleural effusion. 2.  Pulmonary venous congestion. 3.  Atherosclerosis.  Original Report Authenticated By: Florencia Reasons, M.D.    Microbiology: Recent Results (from the past 240 hour(s))  MRSA PCR SCREENING     Status: Normal   Collection Time   11/21/11  3:20 AM      Component Value Range Status Comment   MRSA by PCR NEGATIVE  NEGATIVE  Final   URINE CULTURE     Status: Normal   Collection Time   11/21/11 12:45 PM      Component Value Range Status Comment   Specimen Description URINE, CLEAN CATCH   Final    Special Requests ADDED ON [id number] @1653    Final    Culture  Setup Time [id number]   Final    Colony Count NO GROWTH   Final    Culture NO GROWTH   Final    Report Status 11/23/2011 FINAL   Final      Labs: Basic Metabolic Panel:  Lab 11/21/11 7829 11/20/11 2241 11/20/11 2240  NA 143 143 139  K 4.1 3.8 --  CL 109 106 105  CO2 26 -- 26  GLUCOSE 130* 122* 119*  BUN 16 19 18   CREATININE 0.70 0.70 0.69  CALCIUM 8.6 -- 9.2  MG -- -- --  PHOS -- -- --   Liver Function Tests:  Lab 11/21/11 0544 11/20/11 2240  AST 16 15  ALT 12 14  ALKPHOS 73 90  BILITOT 0.4 0.3  PROT 6.4 6.7    ALBUMIN 3.6 3.8    Lab 11/20/11 2308  AMMONIA 24   CBC:  Lab 11/23/11 0710 11/21/11 0544 11/20/11 2241 11/20/11 2240  WBC 5.8 6.2 -- 7.0  NEUTROABS -- -- -- 3.6  HGB 12.6* 11.7* 11.9* 12.4*  HCT 34.9* 32.9* 35.0* 34.3*  MCV 85.1 85.0 -- 84.7  PLT 156 149* -- 174  Cardiac Enzymes:  Lab 11/21/11 1859 11/21/11 1044 11/21/11 0219 11/21/11 0116 11/20/11 2240  CKTOTAL 75 92 104 107 112  CKMB 2.2 2.3 2.6 2.6 2.8  CKMBINDEX -- -- -- -- --  TROPONINI <0.30 <0.30 <0.30 <0.30 <0.30   BNP: No components found with this basename: POCBNP:5 CBG:  Lab 11/22/11 0846 11/21/11 2210 11/20/11 2257  GLUCAP 150* 135* 115*    Time coordinating discharge: 30 minutes.  SignedHartley Barefoot  [facility] 11/23/2011, 10:54 AM

## 2012-04-12 ENCOUNTER — Encounter (HOSPITAL_COMMUNITY): Payer: Self-pay | Admitting: *Deleted

## 2012-04-12 ENCOUNTER — Inpatient Hospital Stay (HOSPITAL_COMMUNITY)
Admission: EM | Admit: 2012-04-12 | Discharge: 2012-04-15 | DRG: 313 | Disposition: A | Discharge status: Home / Self Care | Payer: Non-veteran care | Attending: Internal Medicine | Admitting: Internal Medicine

## 2012-04-12 DIAGNOSIS — F3289 Other specified depressive episodes: Secondary | ICD-10-CM | POA: Diagnosis present

## 2012-04-12 DIAGNOSIS — Z8673 Personal history of transient ischemic attack (TIA), and cerebral infarction without residual deficits: Secondary | ICD-10-CM

## 2012-04-12 DIAGNOSIS — Z9861 Coronary angioplasty status: Secondary | ICD-10-CM

## 2012-04-12 DIAGNOSIS — F329 Major depressive disorder, single episode, unspecified: Secondary | ICD-10-CM | POA: Diagnosis present

## 2012-04-12 DIAGNOSIS — R51 Headache: Secondary | ICD-10-CM

## 2012-04-12 DIAGNOSIS — I639 Cerebral infarction, unspecified: Secondary | ICD-10-CM

## 2012-04-12 DIAGNOSIS — H538 Other visual disturbances: Secondary | ICD-10-CM

## 2012-04-12 DIAGNOSIS — I2510 Atherosclerotic heart disease of native coronary artery without angina pectoris: Secondary | ICD-10-CM

## 2012-04-12 DIAGNOSIS — R29898 Other symptoms and signs involving the musculoskeletal system: Secondary | ICD-10-CM | POA: Diagnosis present

## 2012-04-12 DIAGNOSIS — F4310 Post-traumatic stress disorder, unspecified: Secondary | ICD-10-CM | POA: Diagnosis present

## 2012-04-12 DIAGNOSIS — R4789 Other speech disturbances: Secondary | ICD-10-CM | POA: Diagnosis present

## 2012-04-12 DIAGNOSIS — G9341 Metabolic encephalopathy: Secondary | ICD-10-CM

## 2012-04-12 DIAGNOSIS — D696 Thrombocytopenia, unspecified: Secondary | ICD-10-CM

## 2012-04-12 DIAGNOSIS — Z87891 Personal history of nicotine dependence: Secondary | ICD-10-CM

## 2012-04-12 DIAGNOSIS — Z7982 Long term (current) use of aspirin: Secondary | ICD-10-CM

## 2012-04-12 DIAGNOSIS — E7800 Pure hypercholesterolemia, unspecified: Secondary | ICD-10-CM | POA: Diagnosis present

## 2012-04-12 DIAGNOSIS — G459 Transient cerebral ischemic attack, unspecified: Secondary | ICD-10-CM

## 2012-04-12 DIAGNOSIS — R0789 Other chest pain: Principal | ICD-10-CM

## 2012-04-12 DIAGNOSIS — R4701 Aphasia: Secondary | ICD-10-CM

## 2012-04-12 DIAGNOSIS — F411 Generalized anxiety disorder: Secondary | ICD-10-CM | POA: Diagnosis present

## 2012-04-12 DIAGNOSIS — I10 Essential (primary) hypertension: Secondary | ICD-10-CM | POA: Diagnosis present

## 2012-04-12 DIAGNOSIS — Z7902 Long term (current) use of antithrombotics/antiplatelets: Secondary | ICD-10-CM

## 2012-04-12 DIAGNOSIS — R4781 Slurred speech: Secondary | ICD-10-CM | POA: Diagnosis present

## 2012-04-12 DIAGNOSIS — D649 Anemia, unspecified: Secondary | ICD-10-CM

## 2012-04-12 DIAGNOSIS — G450 Vertebro-basilar artery syndrome: Secondary | ICD-10-CM

## 2012-04-12 DIAGNOSIS — Z79899 Other long term (current) drug therapy: Secondary | ICD-10-CM

## 2012-04-12 HISTORY — DX: Essential (primary) hypertension: I10

## 2012-04-12 HISTORY — DX: Pure hypercholesterolemia, unspecified: E78.00

## 2012-04-12 LAB — CBC
Platelets: 164 10*3/uL (ref 150–400)
RBC: 4.09 MIL/uL — ABNORMAL LOW (ref 4.22–5.81)
RDW: 12.2 % (ref 11.5–15.5)
WBC: 7.5 10*3/uL (ref 4.0–10.5)

## 2012-04-12 LAB — POCT I-STAT TROPONIN I: Troponin i, poc: 0 ng/mL (ref 0.00–0.08)

## 2012-04-12 MED ORDER — ONDANSETRON HCL 4 MG/2ML IJ SOLN
4.0000 mg | Freq: Once | INTRAMUSCULAR | Status: AC
  Administered 2012-04-13: 4 mg via INTRAVENOUS
  Filled 2012-04-12: qty 2

## 2012-04-12 NOTE — ED Notes (Signed)
Patients family called stating he was lethargic.  Per family he gets this way when a has a TIA.  Answers questions but slowly.  5 mins prior to arrival he began c/o CP.  NTG 1 given

## 2012-04-13 ENCOUNTER — Emergency Department (HOSPITAL_COMMUNITY): Payer: Non-veteran care

## 2012-04-13 ENCOUNTER — Encounter (HOSPITAL_COMMUNITY): Payer: Self-pay | Admitting: *Deleted

## 2012-04-13 ENCOUNTER — Observation Stay (HOSPITAL_COMMUNITY): Payer: Non-veteran care

## 2012-04-13 DIAGNOSIS — R4781 Slurred speech: Secondary | ICD-10-CM | POA: Diagnosis present

## 2012-04-13 DIAGNOSIS — I6350 Cerebral infarction due to unspecified occlusion or stenosis of unspecified cerebral artery: Secondary | ICD-10-CM

## 2012-04-13 DIAGNOSIS — R4701 Aphasia: Secondary | ICD-10-CM

## 2012-04-13 DIAGNOSIS — I2510 Atherosclerotic heart disease of native coronary artery without angina pectoris: Secondary | ICD-10-CM

## 2012-04-13 DIAGNOSIS — R4789 Other speech disturbances: Secondary | ICD-10-CM

## 2012-04-13 DIAGNOSIS — R0789 Other chest pain: Secondary | ICD-10-CM | POA: Diagnosis present

## 2012-04-13 DIAGNOSIS — H538 Other visual disturbances: Secondary | ICD-10-CM | POA: Diagnosis present

## 2012-04-13 LAB — POCT I-STAT TROPONIN I
Troponin i, poc: 0 ng/mL (ref 0.00–0.08)
Troponin i, poc: 0 ng/mL (ref 0.00–0.08)

## 2012-04-13 LAB — URINALYSIS, ROUTINE W REFLEX MICROSCOPIC
Glucose, UA: NEGATIVE mg/dL
Leukocytes, UA: NEGATIVE
Protein, ur: NEGATIVE mg/dL
Specific Gravity, Urine: 1.031 — ABNORMAL HIGH (ref 1.005–1.030)
pH: 6 (ref 5.0–8.0)

## 2012-04-13 LAB — CK TOTAL AND CKMB (NOT AT ARMC): Relative Index: INVALID (ref 0.0–2.5)

## 2012-04-13 LAB — HEMOGLOBIN A1C
Hgb A1c MFr Bld: 5.8 % — ABNORMAL HIGH (ref <5.7)
Mean Plasma Glucose: 120 mg/dL — ABNORMAL HIGH (ref <117)

## 2012-04-13 LAB — CBC
MCH: 30.1 pg (ref 26.0–34.0)
MCHC: 35.2 g/dL (ref 30.0–36.0)
MCV: 85.7 fL (ref 78.0–100.0)
Platelets: 154 10*3/uL (ref 150–400)

## 2012-04-13 LAB — CREATININE, SERUM: Creatinine, Ser: 0.87 mg/dL (ref 0.50–1.35)

## 2012-04-13 LAB — GLUCOSE, CAPILLARY
Glucose-Capillary: 112 mg/dL — ABNORMAL HIGH (ref 70–99)
Glucose-Capillary: 122 mg/dL — ABNORMAL HIGH (ref 70–99)

## 2012-04-13 LAB — COMPREHENSIVE METABOLIC PANEL
ALT: 14 U/L (ref 0–53)
AST: 13 U/L (ref 0–37)
Albumin: 3.5 g/dL (ref 3.5–5.2)
Alkaline Phosphatase: 91 U/L (ref 39–117)
Chloride: 106 mEq/L (ref 96–112)
Potassium: 4 mEq/L (ref 3.5–5.1)
Total Bilirubin: 0.2 mg/dL — ABNORMAL LOW (ref 0.3–1.2)

## 2012-04-13 LAB — TROPONIN I: Troponin I: <0.3 ng/mL (ref <0.30)

## 2012-04-13 LAB — MRSA PCR SCREENING: MRSA by PCR: NEGATIVE

## 2012-04-13 MED ORDER — MORPHINE SULFATE 4 MG/ML IJ SOLN
INTRAMUSCULAR | Status: AC
  Administered 2012-04-13: 2 mg
  Filled 2012-04-13: qty 1

## 2012-04-13 MED ORDER — LORAZEPAM 1 MG PO TABS
1.0000 mg | ORAL_TABLET | Freq: Once | ORAL | Status: AC
  Administered 2012-04-13: 1 mg via ORAL
  Filled 2012-04-13: qty 1

## 2012-04-13 MED ORDER — KETOROLAC TROMETHAMINE 30 MG/ML IJ SOLN: 30.0000 mg | Freq: Four times a day (QID) | INTRAMUSCULAR | Status: DC | PRN

## 2012-04-13 MED ORDER — OXYCODONE-ACETAMINOPHEN 5-325 MG PO TABS
1.0000 | ORAL_TABLET | Freq: Four times a day (QID) | ORAL | Status: DC
  Filled 2012-04-13: qty 2

## 2012-04-13 MED ORDER — OXYCODONE-ACETAMINOPHEN 5-325 MG PO TABS
1.0000 | ORAL_TABLET | Freq: Four times a day (QID) | ORAL | Status: DC | PRN
  Administered 2012-04-13: 2 via ORAL

## 2012-04-13 MED ORDER — KETOROLAC TROMETHAMINE 30 MG/ML IJ SOLN
30.0000 mg | Freq: Once | INTRAMUSCULAR | Status: AC
  Administered 2012-04-13: 30 mg via INTRAVENOUS
  Filled 2012-04-13: qty 1

## 2012-04-13 MED ORDER — LISINOPRIL 10 MG PO TABS
10.0000 mg | ORAL_TABLET | Freq: Every day | ORAL | Status: DC
  Administered 2012-04-13 – 2012-04-15 (×3): 10 mg via ORAL
  Filled 2012-04-13 (×3): qty 1

## 2012-04-13 MED ORDER — NITROGLYCERIN IN D5W 200-5 MCG/ML-% IV SOLN: 2.0000 ug/min | INTRAVENOUS | Status: DC

## 2012-04-13 MED ORDER — NITROGLYCERIN IN D5W 200-5 MCG/ML-% IV SOLN
INTRAVENOUS | Status: AC
  Administered 2012-04-13: 10 ug
  Filled 2012-04-13: qty 250

## 2012-04-13 MED ORDER — ENOXAPARIN SODIUM 40 MG/0.4ML ~~LOC~~ SOLN
40.0000 mg | SUBCUTANEOUS | Status: DC
  Administered 2012-04-13 – 2012-04-15 (×3): 40 mg via SUBCUTANEOUS
  Filled 2012-04-13 (×3): qty 0.4

## 2012-04-13 MED ORDER — ASPIRIN 325 MG PO TABS
325.0000 mg | ORAL_TABLET | Freq: Every day | ORAL | Status: DC
  Administered 2012-04-13 – 2012-04-15 (×3): 325 mg via ORAL
  Filled 2012-04-13 (×3): qty 1

## 2012-04-13 MED ORDER — SODIUM CHLORIDE 0.9 % IV SOLN: INTRAVENOUS | Status: DC

## 2012-04-13 MED ORDER — CLOPIDOGREL BISULFATE 75 MG PO TABS
75.0000 mg | ORAL_TABLET | Freq: Every day | ORAL | Status: DC
  Administered 2012-04-13 – 2012-04-15 (×3): 75 mg via ORAL
  Filled 2012-04-13 (×5): qty 1

## 2012-04-13 MED ORDER — CARVEDILOL 3.125 MG PO TABS
3.1250 mg | ORAL_TABLET | Freq: Two times a day (BID) | ORAL | Status: DC
  Administered 2012-04-14 – 2012-04-15 (×3): 3.125 mg via ORAL
  Filled 2012-04-13 (×5): qty 1

## 2012-04-13 NOTE — Progress Notes (Signed)
EEG completed at bedside as ordered °

## 2012-04-13 NOTE — Progress Notes (Signed)
Pt complaining of 8/10 chest pain. Increased nitro to without having any relief and also gave scheduled 325 asprin.hr=55 and bp =125/81. Increase o2 to 4L, patient sats are 100%.Notified md. MD reviewing chart, waiting on orders.

## 2012-04-13 NOTE — H&P (Signed)
[facility] History and Physical  [name] [name]:[id number] DOB: 1946/02/12    PCP:   MD at [facility].  Chief Complaint: inability to focus, heaches, aphasic, chest pain.  HPI: [name] [name] is an 66 y.o. male with hx of PTSD, CAD, recurrent TIAs with several admission for same, atypical chest pain felt to be musculoskeletal, chronic HA, presents with very similar symptoms he had in the past which are slow speech, slurred speech, and blurry vision.  He was admitted for these and had several brain imaging including 2 MRIs which are both normal.  He was admitted for chest pain last time and cardiology felt that it was musculoskeletal symptoms.  He unfortunately had dissection during his prior catheterization, and told me that was when he had his neurological problems.  He also has HA when his symptoms are present.  In the ER, he complained of substernal chest pain.  Hospitalist was asked to admit him for further evaluation and tx.  Rewiew of Systems:  Constitutional: Negative for malaise, fever and chills. No significant weight loss or weight gain Eyes: Negative for eye pain, redness and discharge,  visual changes, or flashes of light. ENMT: Negative for ear pain, hoarseness, nasal congestion, sinus pressure and sore throat.  tinnitus, drooling, or problem swallowing. Cardiovascular: Negative for chest pain, palpitations, diaphoresis, dyspnea and peripheral edema. ; No orthopnea, PND Respiratory: Negative for cough, hemoptysis, wheezing and stridor. No pleuritic chestpain. Gastrointestinal: Negative for nausea, vomiting, diarrhea, constipation, abdominal pain, melena, blood in stool, hematemesis, jaundice and rectal bleeding.    Genitourinary: Negative for frequency, dysuria, incontinence,flank pain and hematuria; Musculoskeletal: Negative for back pain and neck pain. Negative for swelling and trauma.;  Skin: . Negative for pruritus, rash, abrasions, bruising and skin lesion.;  ulcerations Neuro: Negative for lightheadedness and neck stiffness. Negative for weakness, altered level of consciousness , altered mental status, extremity weakness, burning feet, involuntary movement, seizure and syncope.  Psych: negative for  insomnia, tearfulness,  hallucinations, paranoia, suicidal or homicidal ideation    Past Medical History  Diagnosis Date  . Coronary artery disease   . Stroke   . Pneumonia   . Anxiety   . Depression   . Hepatitis   . Hypertension   . Hypercholesteremia     Past Surgical History  Procedure Date  . Cardiac catheterization   . Coronary angioplasty     Medications:  HOME MEDS: Prior to Admission medications   Medication Sig Start Date End Date Taking? Authorizing Provider  acetaminophen (TYLENOL) 325 MG tablet Take 2 tablets (650 mg total) by mouth every 4 (four) hours as needed (temperature >/= 99.5 F). 11/23/11 11/22/12 Yes Belkys A Regalado, MD  aspirin 81 MG chewable tablet Chew 81 mg by mouth daily.   Yes Historical Provider, MD  buPROPion (WELLBUTRIN SR) 100 MG 12 hr tablet Take 200 mg by mouth daily.   Yes Historical Provider, MD  carvedilol (COREG) 3.125 MG tablet Take 3.125 mg by mouth daily.   Yes Historical Provider, MD  clonazePAM (KLONOPIN) 1 MG tablet Take 1 mg by mouth at bedtime.     Yes Historical Provider, MD  clopidogrel (PLAVIX) 75 MG tablet Take 75 mg by mouth daily.     Yes Historical Provider, MD  metoprolol succinate (TOPROL-XL) 50 MG 24 hr tablet Take 25 mg by mouth daily at 10 pm. Take with or immediately following a meal.   Yes Historical Provider, MD  pravastatin (PRAVACHOL) 20 MG tablet Take 10 mg  by mouth at bedtime.    Yes Historical Provider, MD  sertraline (ZOLOFT) 100 MG tablet Take 100 mg by mouth 2 (two) times daily.    Yes Historical Provider, MD  tiZANidine (ZANAFLEX) 4 MG tablet Take 4 mg by mouth at bedtime.   Yes Historical Provider, MD  nitroGLYCERIN (NITROSTAT) 0.4 MG SL tablet Place 0.4 mg under the  tongue every 5 (five) minutes as needed. For chest pain     Historical Provider, MD     Allergies:  Allergies  Allergen Reactions  . Clindamycin/Lincomycin Other (See Comments)    Blisters in mouth  . Dilaudid (Hydromorphone Hcl) Other (See Comments)    "CRASHES"    Social History:   reports that he quit smoking about 20 years ago. His smoking use included Cigarettes. He has a 40 pack-year smoking history. He has never used smokeless tobacco. He reports that he does not drink alcohol or use illicit drugs.  Family History: Family History  Problem Relation Age of Onset  . Heart disease Mother     Several other maternal family members     Physical Exam: Filed Vitals:   04/13/12 0039 04/13/12 0041 04/13/12 0045 04/13/12 0130  BP: 117/72 129/72 112/67 112/67  Pulse: 64 71 65 63  Temp:      TempSrc:      Resp:   20 14  SpO2:   98% 99%   Blood pressure 112/67, pulse 63, temperature 98.1 F (36.7 C), temperature source Oral, resp. rate 14, SpO2 99.00%.  GEN:  Pleasant  patient lying in the stretcher in no acute distress; cooperative with exam. PSYCH:  alert and oriented x4; does not appear anxious or depressed; affect is appropriate. HEENT: Mucous membranes pink and anicteric; PERRLA; EOM intact; no cervical lymphadenopathy nor thyromegaly or carotid bruit; no JVD; There were no stridor. Neck is very supple. Breasts:: Not examined CHEST WALL: No tenderness CHEST: Normal respiration, clear to auscultation bilaterally.  HEART: Regular rate and rhythm.  There are no murmur, rub, or gallops.   BACK: No kyphosis or scoliosis; no CVA tenderness ABDOMEN: soft and non-tender; no masses, no organomegaly, normal abdominal bowel sounds; no pannus; no intertriginous candida. There is no rebound and no distention. Rectal Exam: Not done EXTREMITIES: No bone or joint deformity; age-appropriate arthropathy of the hands and knees; no edema; no ulcerations.  There is no calf  tenderness. Genitalia: not examined PULSES: 2+ and symmetric SKIN: Normal hydration no rash or ulceration CNS: Cranial nerves 2-12 grossly intact no focal lateralizing neurologic deficit.  Speech is fluent; uvula elevated with phonation, facial symmetry and tongue midline. DTR are normal bilaterally, cerebella exam is intact, barbinski is negative and strengths are equaled bilaterally.  No sensory loss. He has a positive Hoover's sign.   Labs on Admission:  Basic Metabolic Panel:  Lab 04/12/12 6213  NA 142  K 4.0  CL 106  CO2 28  GLUCOSE 121*  BUN 25*  CREATININE 0.88  CALCIUM 9.1  MG 2.0  PHOS --   Liver Function Tests:  Lab 04/12/12 2335  AST 13  ALT 14  ALKPHOS 91  BILITOT 0.2*  PROT 6.6  ALBUMIN 3.5   No results found for this basename: LIPASE:5,AMYLASE:5 in the last 168 hours No results found for this basename: AMMONIA:5 in the last 168 hours CBC:  Lab 04/12/12 2335  WBC 7.5  NEUTROABS --  HGB 12.3*  HCT 35.1*  MCV 85.8  PLT 164   Cardiac Enzymes: No results found  for this basename: CKTOTAL:5,CKMB:5,CKMBINDEX:5,TROPONINI:5 in the last 168 hours  CBG:  Lab 04/13/12 0017  GLUCAP 122*     Radiological Exams on Admission: Ct Head Wo Contrast  04/13/2012  *RADIOLOGY REPORT*  Clinical Data: Terrible headache.  Difficulty to a focus of ice. Expressive aphasia.  Left arm and leg weakness.  CT HEAD WITHOUT CONTRAST  Technique:  Contiguous axial images were obtained from the base of the skull through the vertex without contrast.  Comparison: MRI brain 11/21/2011.  CT head 11/20/2011.  Findings: Ventricles and sulci appear symmetrical.  No mass effect or midline shift.  No abnormal extra-axial fluid collections.  Wallace Cullens- white matter junctions are distinct.  Basal cisterns are not effaced.  No evidence of acute intracranial hemorrhage.  No depressed skull fractures.  Visualized paranasal sinuses and mastoid air cells are not opacified.  No significant change since  previous study.  IMPRESSION: No acute intracranial abnormalities.   Original Report Authenticated By: Marlon Pel, M.D.      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  His main complaints however are trouble focusing with slow speech, and chest pain.  He also has chronic HA.  I will admit him for TIA work up, though he already had several MRIs that are normal.  I can't help but think there is a psychogenic component to his symptoms as well.  His chest pain is atypical as well, with normal EKG and negative troponin.  I will be prudent and start him on NTG drip along with adding Plavix to his regimen.  Note that the Hoover's sign is positive when I asked him to lift his legs.  I believe we need to consult neurology for further recommendations.  He is being put in the SDU because he has persistent chest pain and may need frequent evaluation.    I update the family and shared my thoughts.  The other possibility would be complex migraine which causes his neurological problems in association with his HA.  I have ordered an EEG as well.  He is stable, full code, and will be admitted to [facility] service.   Other plans as per orders.  Code Status: FULL Unk Lightning, MD. [facility] Pager [phone] 7pm to 7am.  04/13/2012, 3:32 AM

## 2012-04-13 NOTE — Progress Notes (Signed)
  Echocardiogram 2D Echocardiogram has been performed.  [name] [name] FRANCES 04/13/2012, 11:48 AM 

## 2012-04-13 NOTE — Progress Notes (Signed)
No ekg changes and VSS. Pt's states his pain has eased off. Received order for patient to go to MRI off monitor. Pt also anxious about MRI and received order for po ativan. Will administer ativan prior to MRI.

## 2012-04-13 NOTE — Progress Notes (Signed)
[facility] PROGRESS NOTE  [name] [name] ZOX:[id number] DOB: Mar 27, 1946 DOA: 04/12/2012 PCP: No primary provider on file.  Assessment/Plan: .chest pain, atypical-but in patient with CAD status post stents -repeat EKG this a.m. with no acute ischemic changes, troponin so far negative,further cycle cardiac enzymes. 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 States no further slurred speech but still with difficulty focusing/blurry vision. 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 Reproducible chest wall tenderness  Respiratory: He to auscultation bilaterally no crackles or wheezes  Abdomen: Soft, bowel sounds present nontender nondistended  Extremities: No cyanosis and no edema  Data Reviewed: Basic Metabolic Panel:  Lab 04/13/12 8119 04/12/12 2335  NA -- 142  K -- 4.0  CL -- 106  CO2 -- 28  GLUCOSE -- 121*  BUN -- 25*  CREATININE 0.87 0.88  CALCIUM -- 9.1  MG -- 2.0  PHOS -- --   Liver Function Tests:  Lab 04/12/12 2335  AST 13  ALT 14  ALKPHOS 91  BILITOT 0.2*  PROT 6.6  ALBUMIN 3.5   No results found for this basename: LIPASE:5,AMYLASE:5 in the last 168 hours No results found for this basename: AMMONIA:5 in the last 168 hours CBC:  Lab 04/13/12 0630 04/12/12 2335  WBC 6.3 7.5  NEUTROABS -- --  HGB 12.2* 12.3*  HCT 34.7* 35.1*  MCV 85.7 85.8  PLT 154 164   Cardiac Enzymes:  Lab 04/13/12 0630  CKTOTAL --  CKMB --  CKMBINDEX --  TROPONINI <0.30   BNP (last 3 results) No results found for this basename: PROBNP:3 in the last 8760 hours CBG:  Lab 04/13/12 0744 04/13/12 0017  GLUCAP 112* 122*    Recent Results (from the past 240 hour(s))  MRSA PCR SCREENING     Status: Normal   Collection Time   04/13/12  6:28 AM  Component Value Range Status Comment   MRSA by PCR NEGATIVE  NEGATIVE Final      Studies: Ct Head Wo Contrast  04/13/2012  *RADIOLOGY REPORT*  Clinical Data: Terrible headache.  Difficulty to a focus of ice. Expressive aphasia.  Left arm and leg weakness.  CT HEAD WITHOUT CONTRAST  Technique:  Contiguous axial images were obtained from the base of the skull through the vertex without contrast.  Comparison: MRI brain 11/21/2011.  CT head 11/20/2011.  Findings: Ventricles and sulci appear symmetrical.  No mass effect or midline shift.  No abnormal extra-axial fluid collections.  Wallace Cullens- white matter junctions are  distinct.  Basal cisterns are not effaced.  No evidence of acute intracranial hemorrhage.  No depressed skull fractures.  Visualized paranasal sinuses and mastoid air cells are not opacified.  No significant change since previous study.  IMPRESSION: No acute intracranial abnormalities.   Original Report Authenticated By: Marlon Pel, M.D.     Scheduled Meds:   . aspirin  325 mg Oral Daily  . clopidogrel  75 mg Oral Q breakfast  . enoxaparin  40 mg Subcutaneous Q24H  . ketorolac  30 mg Intravenous Once  . morphine      . nitroGLYCERIN      . ondansetron (ZOFRAN) IV  4 mg Intravenous Once   Continuous Infusions:   . sodium chloride 10 mL (04/13/12 0600)  . 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 If 8PM-8AM, please contact night-coverage at [email or web address], password [facility] 04/13/2012, 11:07 AM  LOS: 1 day

## 2012-04-13 NOTE — Consult Note (Signed)
[facility] CONSULT [facility]     Reason for Consult: Recurrent slurred speech, blurred vision and weakness on the left side.    HPI:    [name] [name] is an 66 y.o. male with a history of posttraumatic stress disorder, coronary disease, transient ischemic attacks, atypical chest pain felt to be musculoskeletal in etiology and chronic headaches, admitted earlier today. He said similar presentations and admissions within the last year. Stroke workup and December 2012 when he presented with left eye ptosis addition to speech changes and left-sided weakness. Workup was negative including MRI study. A repeat MRI study for similar symptoms in May of this year which was negative. CT scan of his brain on this admission was normal. He reportedly had an arterial dissection during a catheterization procedure for stent placement and reportedly had a stroke as a result. He is complaining of inability to focus visually and indicates that he is seeing double. This is the same visual pattern that he's had in the past however. He takes aspirin daily. He reportedly has known reduced peripheral vision involving his left eye. Patient had an EEG earlier today which was essentially normal. There was no indication of an epileptic disorder.  Past Medical History  Diagnosis Date  . Coronary artery disease   . Stroke   . Pneumonia   . Anxiety   . Depression   . Hepatitis   . Hypertension   . Hypercholesteremia     Past Surgical History  Procedure Date  . Cardiac catheterization   . Coronary angioplasty     Family History  Problem Relation Age of Onset  . Heart disease Mother     Several other maternal family members    Social History:  reports that he quit smoking about 20 years ago. His smoking use included Cigarettes. He has a 40 pack-year smoking history. He has never used smokeless tobacco. He reports that he does not drink alcohol or use illicit drugs.  Allergies    Allergen Reactions  . Clindamycin/Lincomycin Other (See Comments)    Blisters in mouth  . Dilaudid (Hydromorphone Hcl) Other (See Comments)    "CRASHES"    Medications:    Scheduled:   . aspirin  325 mg Oral Daily  . clopidogrel  75 mg Oral Q breakfast  . enoxaparin  40 mg Subcutaneous Q24H  . ketorolac  30 mg Intravenous Once  . morphine      . nitroGLYCERIN      . ondansetron (ZOFRAN) IV  4 mg Intravenous Once  . oxyCODONE-acetaminophen  1-2 tablet Oral Q6H   WUX:[id number]  Blood pressure 149/93, pulse 73, temperature 97.9 F (36.6 C), temperature source Oral, resp. rate 15, height 5\' 10"  (1.778 m), weight 102.1 kg (225 lb 1.4 oz), SpO2 99.00%.   Neurologic Examination:  Mental Status: Alert, oriented, depressed affect.  Speech fluent without evidence of aphasia. Able to follow commands. Cranial Nerves: II-Visual fields were normal. III/IV/VI-Pupils were equal and reacted. Extraocular movements were full and conjugate.    V/VII- no facial weakness. VIII-normal. X-normal speech and symmetrical palatal movement. Motor: 5/5 bilaterally with normal tone and bulk Deep Tendon Reflexes: 2+ and symmetric. Plantars: Flexor bilaterally Cerebellar: Normal finger-to-nose testing bilaterally. Carotid auscultation: Normal    Assessment/Plan:  Etiology for visual changes is unclear. Patient has conjugate eye movements with no indications of oculomotor disturbance. Visual fields are intact  to finger counting. He has no focal motor deficits. Coordination is normal. He has no clinical signs of acute stroke. TIA is also unlikely since his presenting symptoms cannot be attributed to a specific cerebrovascular territory.  Agree with obtaining MRI study to rule out remote possibility of an acute small stroke. Recommend he continue aspirin on a daily basis if MRI shows no indication of acute stroke. No further neurological intervention would be indicated if MRI is unremarkable.  Venetia Maxon M.D. [facility] [phone]  04/13/2012, 1:20 PM

## 2012-04-13 NOTE — Progress Notes (Signed)
Pt complaining of chest pain 10/10. HR=73, bp=149/93. Received order to cut off nitro drip and give patient percocet for pain d/t cardiac enzymes negative and normal ecg. Will cut drip off and give pain med per order. Will continue to monitor.

## 2012-04-13 NOTE — Procedures (Signed)
EEG NUMBER:  [id number].  REFERRING PHYSICIAN:  Kela Millin, MD  INDICATION FOR STUDY:  A 67 year old man with posttraumatic stress disorder and coronary artery disease as well as recurrent TIAs.  The patient presented with slow slurred speech and has also complained of blurred vision.  DESCRIPTION:  This is a routine EEG recording performed during wakefulness and during light sleep.  Predominant background activity during wakefulness consisted of 8 Hz symmetrical alpha rhythm.  Photic stimulation was not performed.  Hyperventilation was not performed. During light sleep, there was generalized slowing of background activity with irregular delta and theta activity diffusely.  Symmetrical sleep spindles were recorded.  No epileptiform discharges were recorded.  INTERPRETATION:  This EEG is essentially normal.  A 8 Hz alpha rhythm is at lower limits of normal for an adult patient of this age.  No evidence of an epileptic disorder was seen.     Noel Christmas, MD    AV:[id number] D:  04/13/2012 12:34:12  T:  04/13/2012 14:11:13  Job #:  [id number]

## 2012-04-13 NOTE — Consult Note (Addendum)
Admit date: 04/12/2012 Referring Physician  Dr. Donna Bernard Primary Physician  [facility] Primary Cardiologist  [facility]/ Sherren Kerns Reason for Consultation  Chest pain  HPI: 66 year old man who had a stent placed in a ramus intermedius vessel in October 2012 while at the [facility].  At that time, he thinks he had pain in the center of his chest along with shortness of breath.  Apparently, his procedure was complicated.  The wife states that he had an initial right radial cath but there was a vessel that was torn and so he had to wait several weeks.  The next cath was done from his groin and then a stent was successfully placed in the ramus.  It was a 2.5 x 38 xience stent.  He has done well since that time.  In fact, he was just seen by his cardiologist for the last few weeks.  He started having chest pain earlier today.  He then got into an ambulance and his pain became very severe.  It is on the left side of his chest.  It is in a different location from the angina he had before his stent.  Of note, he did have a fall several weeks ago where he fell on his left side.  He still has left arm, shoulder and chest pain.  He had some swelling in his left chest after this.  The swelling has resolved.  Currently, he continues to have 8/10 pain despite IV nitroglycerin.  He is wary of taking Dilaudid because he apparently had a reaction to this while at the [facility].  Pain is significantly worsened by taking a deep breath.     PMH:   Past Medical History  Diagnosis Date  . Coronary artery disease   . Stroke   . Pneumonia   . Anxiety   . Depression   . Hepatitis   . Hypertension   . Hypercholesteremia      PSH:   Past Surgical History  Procedure Date  . Cardiac catheterization   . Coronary angioplasty     Allergies:  Clindamycin/lincomycin and Dilaudid Prior to Admit Meds:   Prescriptions prior to admission  Medication Sig Dispense Refill  . acetaminophen (TYLENOL) 325 MG tablet Take 2  tablets (650 mg total) by mouth every 4 (four) hours as needed (temperature >/= 99.5 F).  30 tablet  0  . aspirin 81 MG chewable tablet Chew 81 mg by mouth daily.      Marland Kitchen buPROPion (WELLBUTRIN SR) 100 MG 12 hr tablet Take 200 mg by mouth daily.      . carvedilol (COREG) 3.125 MG tablet Take 3.125 mg by mouth daily.      . clonazePAM (KLONOPIN) 1 MG tablet Take 1 mg by mouth at bedtime.        . clopidogrel (PLAVIX) 75 MG tablet Take 75 mg by mouth daily.        . metoprolol succinate (TOPROL-XL) 50 MG 24 hr tablet Take 25 mg by mouth daily at 10 pm. Take with or immediately following a meal.      . pravastatin (PRAVACHOL) 20 MG tablet Take 10 mg by mouth at bedtime.       . sertraline (ZOLOFT) 100 MG tablet Take 100 mg by mouth 2 (two) times daily.       Marland Kitchen tiZANidine (ZANAFLEX) 4 MG tablet Take 4 mg by mouth at bedtime.      . nitroGLYCERIN (NITROSTAT) 0.4 MG SL tablet Place 0.4 mg under  the tongue every 5 (five) minutes as needed. For chest pain        Fam HX:    Family History  Problem Relation Age of Onset  . Heart disease Mother     Several other maternal family members   Social HX:    History   Social History  . Marital Status: Married    Spouse Name: N/A    Number of Children: N/A  . Years of Education: N/A   Occupational History  . Not on file.   Social History Main Topics  . Smoking status: Former Smoker -- 2.0 packs/day for 20 years    Types: Cigarettes    Quit date: 11/21/1991  . Smokeless tobacco: Never Used  . Alcohol Use: No  . Drug Use: No  . Sexually Active: Not on file   Other Topics Concern  . Not on file   Social History Narrative  . No narrative on file     ROS:  All 11 ROS were addressed and are negative except what is stated in the HPI  Physical Exam: Blood pressure 128/73, pulse 61, temperature 98.5 F (36.9 C), temperature source Oral, resp. rate 14, height 5\' 10"  (1.778 m), weight 102.1 kg (225 lb 1.4 oz), SpO2 99.00%.    General: Well  developed, well nourished, Head:    Normal cephalic and atramatic  Lungs:  Clear bilaterally to auscultation and percussion. Heart:  HRRR S1 S2  Abdomen:  abdomen soft and non-tender Msk:  Back normal, normal gait. Normal strength and tone for age.  Left chest wall is sore to touch. Extremities: No clubbing, cyanosis or edema.   Neuro: Alert and oriented X 3. Psych:  Good affect, responds appropriately    Labs:   Lab Results  Component Value Date   WBC 6.3 04/13/2012   HGB 12.2* 04/13/2012   HCT 34.7* 04/13/2012   MCV 85.7 04/13/2012   PLT 154 04/13/2012    Lab 04/13/12 0630 04/12/12 2335  NA -- 142  K -- 4.0  CL -- 106  CO2 -- 28  BUN -- 25*  CREATININE 0.87 --  CALCIUM -- 9.1  PROT -- 6.6  BILITOT -- 0.2*  ALKPHOS -- 91  ALT -- 14  AST -- 13  GLUCOSE -- 121*   No results found for this basename: PTT   Lab Results  Component Value Date   INR 1.03 04/12/2012   INR 0.92 11/20/2011   INR 0.98 06/05/2011   Lab Results  Component Value Date   CKTOTAL 75 11/21/2011   CKMB 2.2 11/21/2011   TROPONINI <0.30 04/13/2012     Lab Results  Component Value Date   CHOL 154 11/21/2011   CHOL 183 06/06/2011   Lab Results  Component Value Date   HDL 41 11/21/2011   HDL 41 16/03/9603   Lab Results  Component Value Date   LDLCALC 93 11/21/2011   LDLCALC 108* 06/06/2011   Lab Results  Component Value Date   TRIG 100 11/21/2011   TRIG 170* 06/06/2011   Lab Results  Component Value Date   CHOLHDL 3.8 11/21/2011   CHOLHDL 4.5 06/06/2011   No results found for this basename: LDLDIRECT      Radiology:  Ct Head Wo Contrast  04/13/2012  *RADIOLOGY REPORT*  Clinical Data: Terrible headache.  Difficulty to a focus of ice. Expressive aphasia.  Left arm and leg weakness.  CT HEAD WITHOUT CONTRAST  Technique:  Contiguous axial images were obtained from the base  of the skull through the vertex without contrast.  Comparison: MRI brain 11/21/2011.  CT head 11/20/2011.  Findings:  Ventricles and sulci appear symmetrical.  No mass effect or midline shift.  No abnormal extra-axial fluid collections.  Wallace Cullens- white matter junctions are distinct.  Basal cisterns are not effaced.  No evidence of acute intracranial hemorrhage.  No depressed skull fractures.  Visualized paranasal sinuses and mastoid air cells are not opacified.  No significant change since previous study.  IMPRESSION: No acute intracranial abnormalities.   Original Report Authenticated By: Marlon Pel, M.D.     EKG:  Sinus bradycardia, no ST segment changes  ASSESSMENT: Possible unstable angina, history of coronary artery disease, hyperlipidemia  PLAN:  Current chest pain is somewhat atypical for cardiac pain, and is different from his prior angina.  There is no objective evidence for ischemia.  Okay to DC nitroglycerin as the patient is only having a headache from this.  Would not heparinize for cardiac reason at this time. Echocardiogram revealed no obvious wall motion abnormalities.  There is asynchronous motion of the septum.  Nothing on his echocardiogram indicates an acute coronary syndrome.  Continue to cycle enzymes.  At this point, would not plan any invasive testing given the lack of objective evidence for ischemia.  In addition, he obviously had some type of complication with his prior procedure.  It seems that if cardiac cath as necessary, femoral access may be preferable.  Last LDL done at [facility] was above target given his coronary artery disease.  Continue pravastatin with target LDL of less than 100.  Will follow.  Corky Crafts., MD  04/13/2012  2:23 PM

## 2012-04-13 NOTE — ED Provider Notes (Signed)
History     CSN: [id number]  Arrival date & time 04/12/12  2230   First MD Initiated Contact with Patient 04/12/12 2318      Chief Complaint  Patient presents with  . Fatigue    (Consider location/radiation/quality/duration/timing/severity/associated sxs/prior treatment) HPI  This patient is a 66 year old man with history of coronary artery disease, CVA, hepatitis, hypertension, high cholesterol, anxiety and depression.  The patient is brought to the emergency department with complaints of diffuse headache, expressive aphasia and left-sided weakness. His symptoms began around 9 PM last night. His symptoms persist but have improved somewhat, according to his wife and daughter. Patient's expressive aphasia has particularly improved. The patient describes his headache as moderately severe. He denies nausea vomiting. He denies photophobia. He denies vertigo.  Of note, the patient's wife reports that this is the sixth episode of the same constellation of symptoms that the patient has had in the past year. Each time, he has been admitted to the local hospital and has had MRI of the brain which was reportedly normal. However, on several occasions, the patient required speech and occupational therapy in order to regain full function. The patient had aspirin prior to arrival. He denies chest pain shortness of breath.  Past Medical History  Diagnosis Date  . Coronary artery disease   . Stroke   . Pneumonia   . Anxiety   . Depression   . Hepatitis   . Hypertension   . Hypercholesteremia     Past Surgical History  Procedure Date  . Cardiac catheterization   . Coronary angioplasty     Family History  Problem Relation Age of Onset  . Heart disease Mother     Several other maternal family members    History  Substance Use Topics  . Smoking status: Former Smoker -- 2.0 packs/day for 20 years    Types: Cigarettes    Quit date: 11/21/1991  . Smokeless tobacco: Never Used  .  Alcohol Use: No      Review of Systems  Gen: no weight loss, fevers, chills, night sweats Eyes: no discharge or drainage, no occular pain or visual changes Nose: no epistaxis or rhinorrhea Mouth: no dental pain, no sore throat Neck: no neck pain Lungs: no SOB, cough, wheezing CV: no chest pain, palpitations, dependent edema or orthopnea Abd: no abdominal pain, nausea, vomiting GU: no dysuria or gross hematuria MSK: no myalgias or arthralgias Neuro: As per history of present illness, otherwise negative Skin: no rash Psyche: negative.  Allergies  Clindamycin/lincomycin and Dilaudid  Home Medications   Current Outpatient Rx  Name Route Sig Dispense Refill  . ACETAMINOPHEN 325 MG PO TABS Oral Take 2 tablets (650 mg total) by mouth every 4 (four) hours as needed (temperature >/= 99.5 F). 30 tablet 0  . ASPIRIN 81 MG PO CHEW Oral Chew 81 mg by mouth daily.    . BUPROPION HCL ER (SR) 100 MG PO TB12 Oral Take 200 mg by mouth daily.    Marland Kitchen CARVEDILOL 3.125 MG PO TABS Oral Take 3.125 mg by mouth daily.    Marland Kitchen CLONAZEPAM 1 MG PO TABS Oral Take 1 mg by mouth at bedtime.      . CLOPIDOGREL BISULFATE 75 MG PO TABS Oral Take 75 mg by mouth daily.      Marland Kitchen METOPROLOL SUCCINATE ER 50 MG PO TB24 Oral Take 25 mg by mouth daily at 10 pm. Take with or immediately following a meal.    . PRAVASTATIN SODIUM 20  MG PO TABS Oral Take 10 mg by mouth at bedtime.     . SERTRALINE HCL 100 MG PO TABS Oral Take 100 mg by mouth 2 (two) times daily.     Marland Kitchen TIZANIDINE HCL 4 MG PO TABS Oral Take 4 mg by mouth at bedtime.    Marland Kitchen NITROGLYCERIN 0.4 MG SL SUBL Sublingual Place 0.4 mg under the tongue every 5 (five) minutes as needed. For chest pain       BP 112/67  Pulse 63  Temp 98.1 F (36.7 C) (Oral)  Resp 14  SpO2 99%  Physical Exam  Gen: well developed and well nourished appearing Head: NCAT Eyes: PERL, EOMI Nose: no epistaixis or rhinorrhea Mouth/throat: mucosa is moist and pink Neck: supple, no  stridor Lungs: CTA B, no wheezing, rhonchi or rales Abd: soft, notender, nondistended Back: no ttp, no cva ttp Skin: no rashese, wnl Neuro: CN ii-xii grossly intact, patient noted to have some mild word finding difficulty, no receptive aphasia appreciated, no dysarthria appreciated, RUE and RLE motor strength 5/5. LUE and LLE motor strength 4/5 with question of some giving way.  Psyche; flat and withdrawn affect, asks that his wife speak for him, cooperative  ED Course  Procedures (including critical care time)  Results for orders placed during the hospital encounter of 04/12/12 (from the past 24 hour(s))  POCT I-STAT TROPONIN I     Status: Normal   Collection Time   04/12/12 11:33 PM      Component Value Range   Troponin i, poc 0.00  0.00 - 0.08 ng/mL   Comment 3           CBC     Status: Abnormal   Collection Time   04/12/12 11:35 PM      Component Value Range   WBC 7.5  4.0 - 10.5 K/uL   RBC 4.09 (*) 4.22 - 5.81 MIL/uL   Hemoglobin 12.3 (*) 13.0 - 17.0 g/dL   HCT 08.6 (*) 57.8 - 46.9 %   MCV 85.8  78.0 - 100.0 fL   MCH 30.1  26.0 - 34.0 pg   MCHC 35.0  30.0 - 36.0 g/dL   RDW 62.9  52.8 - 41.3 %   Platelets 164  150 - 400 K/uL  COMPREHENSIVE METABOLIC PANEL     Status: Abnormal   Collection Time   04/12/12 11:35 PM      Component Value Range   Sodium 142  135 - 145 mEq/L   Potassium 4.0  3.5 - 5.1 mEq/L   Chloride 106  96 - 112 mEq/L   CO2 28  19 - 32 mEq/L   Glucose, Bld 121 (*) 70 - 99 mg/dL   BUN 25 (*) 6 - 23 mg/dL   Creatinine, Ser 2.44  0.50 - 1.35 mg/dL   Calcium 9.1  8.4 - 01.0 mg/dL   Total Protein 6.6  6.0 - 8.3 g/dL   Albumin 3.5  3.5 - 5.2 g/dL   AST 13  0 - 37 U/L   ALT 14  0 - 53 U/L   Alkaline Phosphatase 91  39 - 117 U/L   Total Bilirubin 0.2 (*) 0.3 - 1.2 mg/dL   GFR calc non Af Amer 86 (*) >90 mL/min   GFR calc Af Amer >90  >90 mL/min  MAGNESIUM     Status: Normal   Collection Time   04/12/12 11:35 PM      Component Value Range   Magnesium  2.0  1.5 - 2.5 mg/dL  PROTIME-INR     Status: Normal   Collection Time   04/12/12 11:35 PM      Component Value Range   Prothrombin Time 13.4  11.6 - 15.2 seconds   INR 1.03  0.00 - 1.49  APTT     Status: Normal   Collection Time   04/12/12 11:35 PM      Component Value Range   aPTT 33  24 - 37 seconds  GLUCOSE, CAPILLARY     Status: Abnormal   Collection Time   04/13/12 12:17 AM      Component Value Range   Glucose-Capillary 122 (*) 70 - 99 mg/dL  URINALYSIS, ROUTINE W REFLEX MICROSCOPIC     Status: Abnormal   Collection Time   04/13/12  1:21 AM      Component Value Range   Color, Urine YELLOW  YELLOW   APPearance CLEAR  CLEAR   Specific Gravity, Urine 1.031 (*) 1.005 - 1.030   pH 6.0  5.0 - 8.0   Glucose, UA NEGATIVE  NEGATIVE mg/dL   Hgb urine dipstick NEGATIVE  NEGATIVE   Bilirubin Urine NEGATIVE  NEGATIVE   Ketones, ur NEGATIVE  NEGATIVE mg/dL   Protein, ur NEGATIVE  NEGATIVE mg/dL   Urobilinogen, UA 1.0  0.0 - 1.0 mg/dL   Nitrite NEGATIVE  NEGATIVE   Leukocytes, UA NEGATIVE  NEGATIVE  POCT I-STAT TROPONIN I     Status: Normal   Collection Time   04/13/12  2:21 AM      Component Value Range   Troponin i, poc 0.00  0.00 - 0.08 ng/mL   Comment 3             Ct Head Wo Contrast  04/13/2012  *RADIOLOGY REPORT*  Clinical Data: Terrible headache.  Difficulty to a focus of ice. Expressive aphasia.  Left arm and leg weakness.  CT HEAD WITHOUT CONTRAST  Technique:  Contiguous axial images were obtained from the base of the skull through the vertex without contrast.  Comparison: MRI brain 11/21/2011.  CT head 11/20/2011.  Findings: Ventricles and sulci appear symmetrical.  No mass effect or midline shift.  No abnormal extra-axial fluid collections.  Wallace Cullens- white matter junctions are distinct.  Basal cisterns are not effaced.  No evidence of acute intracranial hemorrhage.  No depressed skull fractures.  Visualized paranasal sinuses and mastoid air cells are not opacified.  No  significant change since previous study.  IMPRESSION: No acute intracranial abnormalities.   Original Report Authenticated By: Marlon Pel, M.D.     EKG: nsr, no acute ischemic changes, normal intervals, normal axis, normal qrs complex  ED work up is non-diagnostic with normal labs and EKG and head CT. It is interesting that the patient has had the same exact constellation of sx 6 times prior this year and has had a normal MRI on multiple occasions at this hospital this year for evaluation of the same sx.  The patient has had ASA and family reports that the patient seems to be slowly but steadily improving. Case discussed with Dr. Conley Rolls who will see and admit. Diagnosis is non-hemorrhagic CVA.     MDM   See above.        Brandt Loosen, MD 04/13/12 [phone]

## 2012-04-14 ENCOUNTER — Observation Stay (HOSPITAL_COMMUNITY): Payer: Non-veteran care

## 2012-04-14 DIAGNOSIS — G459 Transient cerebral ischemic attack, unspecified: Secondary | ICD-10-CM

## 2012-04-14 DIAGNOSIS — R0789 Other chest pain: Principal | ICD-10-CM

## 2012-04-14 LAB — GLUCOSE, CAPILLARY: Glucose-Capillary: 105 mg/dL — ABNORMAL HIGH (ref 70–99)

## 2012-04-14 LAB — RAPID URINE DRUG SCREEN, HOSP PERFORMED
Amphetamines: NOT DETECTED
Cocaine: NOT DETECTED
Opiates: NOT DETECTED
Tetrahydrocannabinol: NOT DETECTED

## 2012-04-14 LAB — LIPID PANEL: LDL Cholesterol: 99 mg/dL (ref 0–99)

## 2012-04-14 NOTE — Progress Notes (Signed)
Report called to [facility], receiving RN on  4 north. VSS. Transferred to 4 north 22 via wheelchair with personal belongings. Family at bedside.  [facility]

## 2012-04-14 NOTE — Progress Notes (Signed)
SUBJECTIVE:  Chest pain improved, but still present on the left side of his chest.  He still wonders about the fall that he suffered a few months ago and a possible residual injury from this.  OBJECTIVE:   Vitals:   Filed Vitals:   04/14/12 0359 04/14/12 0700 04/14/12 0800 04/14/12 0823  BP: 115/69 127/66 112/75 112/75  Pulse: 57 60 54 65  Temp: 97.8 F (36.6 C)   97.8 F (36.6 C)  TempSrc: Oral   Oral  Resp: 13 18 14    Height:      Weight:      SpO2: 95%      I&O's:   Intake/Output Summary (Last 24 hours) at 04/14/12 1308 Last data filed at 04/14/12 0800  Gross per 24 hour  Intake 2793.5 ml  Output   1950 ml  Net  843.5 ml   TELEMETRY: Reviewed telemetry pt in NSR:     PHYSICAL EXAM General: Well developed, well nourished, in no acute distress Head:   Normal cephalic and atramatic  Lungs:   Clear bilaterally to auscultation and percussion. Heart:   HRRR S1 S2  Abdomen:abdomen soft and non-tender  Msk:   Normal strength and tone for age. Extremities:   No clubbing, cyanosis or edema.  DP +1 Neuro: Alert and oriented X 3. Psych:  normal affect, responds appropriately   LABS: Basic Metabolic Panel:  Basename 04/13/12 0630 04/12/12 2335  NA -- 142  K -- 4.0  CL -- 106  CO2 -- 28  GLUCOSE -- 121*  BUN -- 25*  CREATININE 0.87 0.88  CALCIUM -- 9.1  MG -- 2.0  PHOS -- --   Liver Function Tests:  [facility] 04/12/12 2335  AST 13  ALT 14  ALKPHOS 91  BILITOT 0.2*  PROT 6.6  ALBUMIN 3.5   No results found for this basename: LIPASE:2,AMYLASE:2 in the last 72 hours CBC:  Basename 04/13/12 0630 04/12/12 2335  WBC 6.3 7.5  NEUTROABS -- --  HGB 12.2* 12.3*  HCT 34.7* 35.1*  MCV 85.7 85.8  PLT 154 164   Cardiac Enzymes:  Basename 04/13/12 1808 04/13/12 1141 04/13/12 0630  CKTOTAL 71 -- --  CKMB 2.0 -- --  CKMBINDEX -- -- --  TROPONINI <0.30 <0.30 <0.30   BNP: No components found with this basename: POCBNP:3 D-Dimer: No results found for this  basename: DDIMER:2 in the last 72 hours Hemoglobin A1C:  Basename 04/13/12 0630  HGBA1C 5.8*   Fasting Lipid Panel:  Basename 04/13/12 2344  CHOL 186  HDL 33*  LDLCALC 99  TRIG 657*  CHOLHDL 5.6  LDLDIRECT --   Thyroid Function Tests: No results found for this basename: TSH,T4TOTAL,FREET3,T3FREE,THYROIDAB in the last 72 hours Anemia Panel: No results found for this basename: VITAMINB12,FOLATE,FERRITIN,TIBC,IRON,RETICCTPCT in the last 72 hours Coag Panel:   Lab Results  Component Value Date   INR 1.03 04/12/2012   INR 0.92 11/20/2011   INR 0.98 06/05/2011    RADIOLOGY: Ct Head Wo Contrast  04/13/2012  *RADIOLOGY REPORT*  Clinical Data: Terrible headache.  Difficulty to a focus of ice. Expressive aphasia.  Left arm and leg weakness.  CT HEAD WITHOUT CONTRAST  Technique:  Contiguous axial images were obtained from the base of the skull through the vertex without contrast.  Comparison: MRI brain 11/21/2011.  CT head 11/20/2011.  Findings: Ventricles and sulci appear symmetrical.  No mass effect or midline shift.  No abnormal extra-axial fluid collections.  Wallace Cullens- white matter junctions are distinct.  Basal cisterns  are not effaced.  No evidence of acute intracranial hemorrhage.  No depressed skull fractures.  Visualized paranasal sinuses and mastoid air cells are not opacified.  No significant change since previous study.  IMPRESSION: No acute intracranial abnormalities.   Original Report Authenticated By: Marlon Pel, M.D.    Mri Brain Without Contrast  04/13/2012  *RADIOLOGY REPORT*  Clinical Data:  Aphasia  MRI HEAD WITHOUT CONTRAST MRA HEAD WITHOUT CONTRAST  Technique:  Multiplanar, multiecho pulse sequences of the brain and surrounding structures were obtained without intravenous contrast. Angiographic images of the head were obtained using MRA technique without contrast.  Comparison:  CT 04/13/2012, MRI 11/21/2011  MRI HEAD  Findings:  Normal brain volume for age.  Ventricle  size is normal. Scattered small white matter hyperintensities, unchanged from the prior study.  No acute infarct.  Basal ganglia are normal.  Slight hyperintensity in the left pons is unchanged and appears chronic. Cerebellum is intact.  Negative for hemorrhage.  No intracranial mass or edema.  No shift of the midline structures.  Paranasal sinuses are clear.  IMPRESSION: Mild chronic microvascular ischemia.  No acute abnormality.  MRA HEAD  Findings: Both vertebral arteries are patent to the basilar.  The basilar and posterior cerebral arteries are patent.  Patent posterior communicating arteries bilaterally.  Internal carotid artery is patent bilaterally.  Anterior and middle cerebral arteries are patent bilaterally.  Mild irregularity in the middle cerebral artery branches on the right compatible with atherosclerotic disease.  No large vessel occlusion or aneurysm.  IMPRESSION: Mild atherosclerotic irregularity and right middle cerebral artery branches.  No large vessel occlusion or aneurysm.   Original Report Authenticated By: Camelia Phenes, M.D.    Mr Mra Head/brain Wo Cm  04/13/2012  *RADIOLOGY REPORT*  Clinical Data:  Aphasia  MRI HEAD WITHOUT CONTRAST MRA HEAD WITHOUT CONTRAST  Technique:  Multiplanar, multiecho pulse sequences of the brain and surrounding structures were obtained without intravenous contrast. Angiographic images of the head were obtained using MRA technique without contrast.  Comparison:  CT 04/13/2012, MRI 11/21/2011  MRI HEAD  Findings:  Normal brain volume for age.  Ventricle size is normal. Scattered small white matter hyperintensities, unchanged from the prior study.  No acute infarct.  Basal ganglia are normal.  Slight hyperintensity in the left pons is unchanged and appears chronic. Cerebellum is intact.  Negative for hemorrhage.  No intracranial mass or edema.  No shift of the midline structures.  Paranasal sinuses are clear.  IMPRESSION: Mild chronic microvascular ischemia.   No acute abnormality.  MRA HEAD  Findings: Both vertebral arteries are patent to the basilar.  The basilar and posterior cerebral arteries are patent.  Patent posterior communicating arteries bilaterally.  Internal carotid artery is patent bilaterally.  Anterior and middle cerebral arteries are patent bilaterally.  Mild irregularity in the middle cerebral artery branches on the right compatible with atherosclerotic disease.  No large vessel occlusion or aneurysm.  IMPRESSION: Mild atherosclerotic irregularity and right middle cerebral artery branches.  No large vessel occlusion or aneurysm.   Original Report Authenticated By: Camelia Phenes, M.D.       ASSESSMENT: CAD, atypical chest pain  PLAN:  S/p DES to ramus vessel at [facility] in 2012.  Persistent chest pain but no objective sign of ischemia.  Would recommend some imaging study of the left chest wall as the patient had a fall a few months ago and reports recurrent intermittent swelling of the left chest area.  Pain in  chest is reproducible with palpation.  Continue aggressive secondary prevention.  Corky Crafts., MD  04/14/2012  9:26 AM

## 2012-04-14 NOTE — Progress Notes (Signed)
[facility] PROGRESS NOTE  ARRIS MEYN ZOX:[id number] DOB: [birth year] DOA: 04/12/2012 PCP: No primary provider on file.  Assessment/Plan: .chest pain, atypical-but in patient with CAD status post stents -repeat EKG 10/19. with no acute ischemic changes, trach enzymes negative -Per cardiology noncardiac, nitroglycerin DC -Clinically improving, but still with some pleuritic pain -Will obtain chest x-ray with ribs to further evaluate -Continue when necessary Toradol for probable musculoskeletal component  .?TIA (transient ischemic attack)/slurred speech- -MRI MRA negative for acute intracranial findings -Symptoms clinically improved -Appreciate neuro assistance, per Dr. Roseanne Reno unclear etiology and likely not TIA -Continue his outpatient meds. 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      Code Status: Full Family Communication: Wife at the bedside Disposition Plan: Transfer to telemetry treat   Consultants:  Cardiology-evaluation pending  Procedures:  None  Antibiotics:  None  HPI/Subjective: Status chest pain decreased-now just mostly pleuritic. Also states his vision is better, and no further slurred speech. Wife at bedside. Objective: Filed Vitals:   04/14/12 0800 04/14/12 0823 04/14/12 1000 04/14/12 1210  BP: 112/75 112/75 138/76   Pulse: 54 65    Temp:  97.8 F (36.6 C)  98.4 F (36.9 C)  TempSrc:  Oral  Oral  Resp: 14     Height:      Weight:      SpO2:        Intake/Output Summary (Last 24 hours) at 04/14/12 1222 Last data filed at 04/14/12 0800  Gross per 24 hour  Intake   2480 ml  Output   1150 ml  Net   1330 ml   Filed Weights   04/13/12 0540  Weight: 102.1 kg (225 lb 1.4 oz)    Exam:   General:  He is alert and oriented x3, in no respiratory  distress  Cardiovascular/chest: Regular rate and rhythm, normal S1-S2. Reproducible chest wall tenderness  Respiratory: He to auscultation bilaterally no crackles or wheezes  Abdomen: Soft, bowel sounds present nontender nondistended  Extremities: No cyanosis and no edema  Data Reviewed: Basic Metabolic Panel:  Lab 04/13/12 8119 04/12/12 2335  NA -- 142  K -- 4.0  CL -- 106  CO2 -- 28  GLUCOSE -- 121*  BUN -- 25*  CREATININE 0.87 0.88  CALCIUM -- 9.1  MG -- 2.0  PHOS -- --   Liver Function Tests:  Lab 04/12/12 2335  AST 13  ALT 14  ALKPHOS 91  BILITOT 0.2*  PROT 6.6  ALBUMIN 3.5   No results found for this basename: LIPASE:5,AMYLASE:5 in the last 168 hours No results found for this basename: AMMONIA:5 in the last 168 hours CBC:  Lab 04/13/12 0630 04/12/12 2335  WBC 6.3 7.5  NEUTROABS -- --  HGB 12.2* 12.3*  HCT 34.7* 35.1*  MCV 85.7 85.8  PLT 154 164   Cardiac Enzymes:  Lab 04/13/12 1808 04/13/12 1141 04/13/12 0630  CKTOTAL 71 -- --  CKMB 2.0 -- --  CKMBINDEX -- -- --  TROPONINI <0.30 <0.30 <0.30   BNP (last 3 results) No results found for this basename: PROBNP:3 in the last 8760 hours CBG:  Lab 04/14/12 0804 04/13/12 2129 04/13/12 1216 04/13/12 0744 04/13/12 0017  GLUCAP 105* 118* 115* 112* 122*    Recent Results (from the past 240 hour(s))  MRSA PCR SCREENING     Status: Normal   Collection Time  04/13/12  6:28 AM      Component Value Range Status Comment   MRSA by PCR NEGATIVE  NEGATIVE Final      Studies: Ct Head Wo Contrast  04/13/2012  *RADIOLOGY REPORT*  Clinical Data: Terrible headache.  Difficulty to a focus of ice. Expressive aphasia.  Left arm and leg weakness.  CT HEAD WITHOUT CONTRAST  Technique:  Contiguous axial images were obtained from the base of the skull through the vertex without contrast.  Comparison: MRI brain 11/21/2011.  CT head 11/20/2011.  Findings: Ventricles and sulci appear symmetrical.  No mass effect or midline  shift.  No abnormal extra-axial fluid collections.  Wallace Cullens- white matter junctions are distinct.  Basal cisterns are not effaced.  No evidence of acute intracranial hemorrhage.  No depressed skull fractures.  Visualized paranasal sinuses and mastoid air cells are not opacified.  No significant change since previous study.  IMPRESSION: No acute intracranial abnormalities.   Original Report Authenticated By: Marlon Pel, M.D.    Mri Brain Without Contrast  04/13/2012  *RADIOLOGY REPORT*  Clinical Data:  Aphasia  MRI HEAD WITHOUT CONTRAST MRA HEAD WITHOUT CONTRAST  Technique:  Multiplanar, multiecho pulse sequences of the brain and surrounding structures were obtained without intravenous contrast. Angiographic images of the head were obtained using MRA technique without contrast.  Comparison:  CT 04/13/2012, MRI 11/21/2011  MRI HEAD  Findings:  Normal brain volume for age.  Ventricle size is normal. Scattered small white matter hyperintensities, unchanged from the prior study.  No acute infarct.  Basal ganglia are normal.  Slight hyperintensity in the left pons is unchanged and appears chronic. Cerebellum is intact.  Negative for hemorrhage.  No intracranial mass or edema.  No shift of the midline structures.  Paranasal sinuses are clear.  IMPRESSION: Mild chronic microvascular ischemia.  No acute abnormality.  MRA HEAD  Findings: Both vertebral arteries are patent to the basilar.  The basilar and posterior cerebral arteries are patent.  Patent posterior communicating arteries bilaterally.  Internal carotid artery is patent bilaterally.  Anterior and middle cerebral arteries are patent bilaterally.  Mild irregularity in the middle cerebral artery branches on the right compatible with atherosclerotic disease.  No large vessel occlusion or aneurysm.  IMPRESSION: Mild atherosclerotic irregularity and right middle cerebral artery branches.  No large vessel occlusion or aneurysm.   Original Report Authenticated By:  Camelia Phenes, M.D.    Mr Mra Head/brain Wo Cm  04/13/2012  *RADIOLOGY REPORT*  Clinical Data:  Aphasia  MRI HEAD WITHOUT CONTRAST MRA HEAD WITHOUT CONTRAST  Technique:  Multiplanar, multiecho pulse sequences of the brain and surrounding structures were obtained without intravenous contrast. Angiographic images of the head were obtained using MRA technique without contrast.  Comparison:  CT 04/13/2012, MRI 11/21/2011  MRI HEAD  Findings:  Normal brain volume for age.  Ventricle size is normal. Scattered small white matter hyperintensities, unchanged from the prior study.  No acute infarct.  Basal ganglia are normal.  Slight hyperintensity in the left pons is unchanged and appears chronic. Cerebellum is intact.  Negative for hemorrhage.  No intracranial mass or edema.  No shift of the midline structures.  Paranasal sinuses are clear.  IMPRESSION: Mild chronic microvascular ischemia.  No acute abnormality.  MRA HEAD  Findings: Both vertebral arteries are patent to the basilar.  The basilar and posterior cerebral arteries are patent.  Patent posterior communicating arteries bilaterally.  Internal carotid artery is patent bilaterally.  Anterior and middle cerebral arteries are  patent bilaterally.  Mild irregularity in the middle cerebral artery branches on the right compatible with atherosclerotic disease.  No large vessel occlusion or aneurysm.  IMPRESSION: Mild atherosclerotic irregularity and right middle cerebral artery branches.  No large vessel occlusion or aneurysm.   Original Report Authenticated By: Camelia Phenes, M.D.     Scheduled Meds:    . aspirin  325 mg Oral Daily  . carvedilol  3.125 mg Oral BID WC  . clopidogrel  75 mg Oral Q breakfast  . enoxaparin  40 mg Subcutaneous Q24H  . lisinopril  10 mg Oral Daily  . LORazepam  1 mg Oral Once  . DISCONTD: oxyCODONE-acetaminophen  1-2 tablet Oral Q6H   Continuous Infusions:    . sodium chloride 10 mL (04/13/12 0600)  . 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 If 8PM-8AM, please contact night-coverage at [email or web address], password [facility] 04/14/2012, 12:22 PM  LOS: 2 days

## 2012-04-14 NOTE — Progress Notes (Signed)
VASCULAR LAB PRELIMINARY  PRELIMINARY  PRELIMINARY  PRELIMINARY  Carotid Dopplers completed.    Preliminary report:  There is no ICA stenosis.  Vertebral artery flow is antegrade.  [name] [name], 04/14/2012, 10:33 AM

## 2012-04-15 DIAGNOSIS — H538 Other visual disturbances: Secondary | ICD-10-CM

## 2012-04-15 DIAGNOSIS — R51 Headache: Secondary | ICD-10-CM

## 2012-04-15 LAB — BASIC METABOLIC PANEL
Chloride: 104 mEq/L (ref 96–112)
Creatinine, Ser: 0.82 mg/dL (ref 0.50–1.35)
GFR calc Af Amer: >90 mL/min (ref >90)
Potassium: 3.9 mEq/L (ref 3.5–5.1)
Sodium: 141 mEq/L (ref 135–145)

## 2012-04-15 MED ORDER — CARVEDILOL 3.125 MG PO TABS: 3.1250 mg | ORAL_TABLET | Freq: Two times a day (BID) | ORAL | Status: DC

## 2012-04-15 MED ORDER — LISINOPRIL 10 MG PO TABS: 10.0000 mg | ORAL_TABLET | Freq: Every day | ORAL | Status: DC

## 2012-04-15 MED ORDER — PNEUMOCOCCAL VAC POLYVALENT 25 MCG/0.5ML IJ INJ
0.5000 mL | INJECTION | Freq: Once | INTRAMUSCULAR | Status: DC
  Filled 2012-04-15: qty 0.5

## 2012-04-15 MED ORDER — OXYCODONE-ACETAMINOPHEN 5-325 MG PO TABS: 1.0000 | ORAL_TABLET | Freq: Four times a day (QID) | ORAL | Status: DC | PRN

## 2012-04-15 NOTE — Progress Notes (Signed)
  Patient Name: [name] [name]      SUBJECTIVE stillwith some pain  Past Medical History  Diagnosis Date  . Coronary artery disease   . Stroke   . Pneumonia   . Anxiety   . Depression   . Hepatitis   . Hypertension   . Hypercholesteremia     PHYSICAL EXAM Filed Vitals:   04/14/12 1559 04/14/12 2224 04/15/12 0604 04/15/12 0927  BP: 130/79 179/94 138/86 140/74  Pulse: 91 60 70 58  Temp: 97.5 F (36.4 C) 98.2 F (36.8 C) 97.9 F (36.6 C) 97.9 F (36.6 C)  TempSrc: Oral Oral Oral Oral  Resp: 18 18 18 18   Height: 5\' 10"  (1.778 m)     Weight: 222 lb (100.699 kg)     SpO2: 97% 96% 97% 98%    Well developed and nourished in no acute distress HENT normal Neck supple with JVP-flat Clear Regular rate and rhythm, no murmurs or gallops Abd-soft with active BS No Clubbing cyanosis edema Skin-warm and dry A & Oriented  Grossly normal sensory and motor function   ELEMETRY: Reviewed telemetry pt in  sinus     Intake/Output Summary (Last 24 hours) at 04/15/12 1042 Last data filed at 04/14/12 2000  Gross per 24 hour  Intake    400 ml  Output    200 ml  Net    200 ml    LABS: Basic Metabolic Panel:  Lab 04/15/12 1610 04/13/12 0630 04/12/12 2335  NA 141 -- 142  K 3.9 -- 4.0  CL 104 -- 106  CO2 30 -- 28  GLUCOSE 106* -- 121*  BUN 15 -- 25*  CREATININE 0.82 0.87 0.88  CALCIUM 8.9 -- 9.1  MG -- -- 2.0  PHOS -- -- --   Cardiac Enzymes:  Basename 04/13/12 1808 04/13/12 1141 04/13/12 0630  CKTOTAL 71 -- --  CKMB 2.0 -- --  CKMBINDEX -- -- --  TROPONINI <0.30 <0.30 <0.30   CBC:  Lab 04/13/12 0630 04/12/12 2335  WBC 6.3 7.5  NEUTROABS -- --  HGB 12.2* 12.3*  HCT 34.7* 35.1*  MCV 85.7 85.8  PLT 154 164   PROTIME:  Basename 04/12/12 2335  LABPROT 13.4  INR 1.03   Liver Function Tests:  Basename 04/12/12 2335  AST 13  ALT 14  ALKPHOS 91  BILITOT 0.2*  PROT 6.6  ALBUMIN 3.5   No results found for this basename: LIPASE:2,AMYLASE:2 in the last  72 hours BNP: BNP (last 3 results) No results found for this basename: PROBNP:3 in the last 8760 hours D-Dimer: No results found for this basename: DDIMER:2 in the last 72 hours Hemoglobin A1C:  Basename 04/13/12 0630  HGBA1C 5.8*   Fasting Lipid Panel:  Basename 04/13/12 2344  CHOL 186  HDL 33*  LDLCALC 99  TRIG 960*  CHOLHDL 5.6  LDLDIRECT --     ASSESSMENT AND PLAN:  Chest pain--non cardiac Hyperlipidemia   Will sign off  Continue current cardiac meds  [facility] for hypertriglysericdemia  Signed, Sherryl Manges MD  04/15/2012

## 2012-04-15 NOTE — Evaluation (Signed)
Occupational Therapy Evaluation Patient Details Name: [name] [name] MRN: [id number] DOB: [birth year] Today's Date: 04/15/2012 Time: 4098-1191 OT Time Calculation (min): 32 min  OT Assessment / Plan / Recommendation Clinical Impression  Pt admitted with L side weakness, negative imaging reports.  Wife reports pt having these episodes periodically.  While strength is improving per pt, continues to need further OT in the home health setting.    OT Assessment  All further OT needs can be met in the next venue of care    Follow Up Recommendations  Home health OT;Supervision/Assistance - 24 hour    Barriers to Discharge      Equipment Recommendations  None recommended by OT    Recommendations for Other Services    Frequency       Precautions / Restrictions Precautions Precautions: Fall Restrictions Weight Bearing Restrictions: No   Pertinent Vitals/Pain L shoulder, did not rate, with AROM, repositioned   ADL  Eating/Feeding: Simulated;Independent Where Assessed - Eating/Feeding: Edge of bed Grooming: Performed;Wash/dry hands;Supervision/safety Where Assessed - Grooming: Unsupported standing Upper Body Bathing: Simulated;Minimal assistance Where Assessed - Upper Body Bathing: Unsupported sitting Lower Body Bathing: Simulated;Minimal assistance Where Assessed - Lower Body Bathing: Supported sit to stand Upper Body Dressing: Performed;Minimal assistance Where Assessed - Upper Body Dressing: Unsupported sitting Lower Body Dressing: Performed;Minimal assistance Where Assessed - Lower Body Dressing: Supported sit to Pharmacist, hospital: Simulated;Min guard Statistician Method: Sit to Barista: Comfort height toilet Toileting - Architect and Hygiene: Simulated;Supervision/safety Where Assessed - Engineer, mining and Hygiene: Sit to stand from 3-in-1 or toilet Equipment Used: Gait belt;Cane Transfers/Ambulation Related to  ADLs: min guard assist with cane ADL Comments: assist primarily because pt does not have good use of L UE    OT Diagnosis: Generalized weakness;Acute pain  OT Problem List: Impaired balance (sitting and/or standing);Decreased knowledge of use of DME or AE;Impaired sensation;Impaired UE functional use;Pain;Decreased range of motion;Decreased strength OT Treatment Interventions:     OT Goals    Visit Information  Last OT Received On: 04/15/12 Assistance Needed: +1    Subjective Data  Subjective: "Moving my shoulder gets it to aching." Patient Stated Goal: Return home, regain use of L side.   Prior Functioning     Home Living Lives With: Spouse Available Help at Discharge: Family;Available 24 hours/day Type of Home: House Home Access: Stairs to enter [employer] of Steps: 1 Entrance Stairs-Rails: None Home Layout: One level Bathroom Shower/Tub: Health visitor: Handicapped height Bathroom Accessibility: Yes How Accessible: Accessible via walker Home Adaptive Equipment: Bedside commode/3-in-1;Shower chair with back;Walker - rolling;Straight cane Prior Function Level of Independence: Independent with assistive device(s);Independent Able to Take Stairs?: Yes Driving: Yes Vocation: Retired Musician: No difficulties Dominant Hand: Right         Vision/Perception     Cognition  Overall Cognitive Status: Appears within functional limits for tasks assessed/performed Arousal/Alertness: Awake/alert Orientation Level: Appears intact for tasks assessed Behavior During Session: [facility] for tasks performed    Extremity/Trunk Assessment Right Upper Extremity Assessment RUE ROM/Strength/Tone: [facility] for tasks assessed Left Upper Extremity Assessment LUE ROM/Strength/Tone: Deficits LUE ROM/Strength/Tone Deficits: painful AROM greater than 90 degrees FF and ABD, elbow to hand strength 4/5 LUE Sensation: Deficits LUE Sensation Deficits:  impaired temp sensation and light touch LUE Coordination: WFL - gross/fine motor Right Lower Extremity Assessment RLE ROM/Strength/Tone: Deficits RLE ROM/Strength/Tone Deficits: grossly 4/5, hip strength when tested with LLE tested 3/5 Left Lower Extremity Assessment LLE ROM/Strength/Tone: Deficits  LLE ROM/Strength/Tone Deficits: grossly 3/5 LLE Sensation: Deficits LLE Sensation Deficits: diminished LT LLE Coordination: WFL - gross/fine motor Trunk Assessment Trunk Assessment: Normal     Mobility Bed Mobility Bed Mobility: Supine to Sit Supine to Sit: 6: Modified independent (Device/Increase time);HOB elevated Transfers Transfers: Sit to Stand;Stand to Sit Sit to Stand: 5: Supervision;From bed;With upper extremity assist;From toilet Stand to Sit: 5: Supervision;With upper extremity assist;To bed;To toilet Details for Transfer Assistance: cues for safety with [facility]     Shoulder Instructions  Recommended pendulums until [facility].   Exercise     Balance Balance Balance Assessed: Yes Static Standing Balance Static Standing - Balance Support: Right upper extremity supported Static Standing - Level of Assistance: 5: Stand by assistance   End of Session OT - End of Session Activity Tolerance: Patient tolerated treatment well Patient left: in bed;with call bell/phone within reach;with family/visitor present  GO     Evern Bio 04/15/2012, 3:36 PM [phone]

## 2012-04-15 NOTE — Progress Notes (Signed)
Pt discharged home, RX, and discharge instructions given to patient and pt's wife.

## 2012-04-15 NOTE — Discharge Summary (Addendum)
Physician Discharge Summary  [name] [name]:[id number] DOB: 1945/10/04 DOA: 04/12/2012  PCP: No primary provider on file.  Admit date: 04/12/2012 Discharge date: 04/15/2012  Recommendations for Outpatient Follow-up:   Follow-up Information   Please follow up. 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 male with hx of PTSD, CAD, recurrent TIAs with several admission for same, atypical chest pain felt to be musculoskeletal, chronic HA, presents with very similar symptoms he had in the past which are slow speech, slurred speech, and blurry vision. He was admitted for these and had several brain imaging including 2 MRIs which are both normal. He was admitted for chest pain last time and cardiology felt that it was musculoskeletal symptoms. He unfortunately had dissection during his prior catheterization, and told me that was when he had his neurological problems. He also has HA when his symptoms are present. In the ER, he complained of substernal chest pain. Hospitalist was asked to admit him for further evaluation and tx.   Hospital Course:  .chest pain, atypical-but in patient with CAD status post stents  As discussed above, upon admission it was noted that patient has a history of coronary artery disease status post stents last October. Cardiac enzymes were cycled -negative, and EKG done did not show any acute ischemic changes. The patient had been placed on nitroglycerin drip on admission along with his beta blockers  and  aspirin Plavix. His pain was persisting and so cardiology was consulted and they saw patient and the impression was that his chest pain was noncardiac. He had reproducible chest wall pain on exam, and her reported that he had was fallen a few weeks prior am and hit the left side. An x-ray of his chest with left ribs was done and showed minimal deformity of a left-sided rib superior to the marker that to be a remote fracture. No acute displaced left-sided rib fractures. No new focal parenchymal opacities are noted. His pain is treated with Toradol and improved, he still has some pain on followup and states it is much better. His to followup outpatient at the [facility] next week as scheduled. The impression was that his chest pain was musculoskeletal. .?TIA (transient ischemic attack)/slurred speech-  -On admission MRI MRA were done and came back negative for acute intracranial findings, an EEG was done and it showed no epileptiform activity.  -Symptoms clinically improved  -Neurology was consulted and Dr. Roseanne Reno saw patient and stated unclear etiology and likely not TIA  -He was maintained on his outpatient Plavix and aspirin. 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       Procedures: This EEG is essentially normal. A 8 Hz alpha rhythm is  at lower limits of normal for an adult patient of this age. No evidence  of an epileptic disorder  was seen. Study Conclusions  2 D-ECHO - Left ventricle: The cavity size was normal. Systolic function was normal. The estimated ejection fraction was in the range of 50% to 55%. Wall motion was normal; there were no regional wall motion abnormalities, except for asynchronous septal motion. - Aortic valve: Mild  regurgitation. - Mitral valve: Mild regurgitation. - Right atrium: The atrium was mildly dilated. Transthoracic echocardiography.   Consultations:  Neuro - Dr Roseanne Reno  Cards- [place]/Dr Graciela Husbands  Discharge Exam: Filed Vitals:   04/14/12 2224 04/15/12 0604 04/15/12 0927 04/15/12 1339  BP: 179/94 138/86 140/74 149/85  Pulse: 60 70 58 62  Temp: 98.2 F (36.8 C) 97.9 F (36.6 C) 97.9 F (36.6 C) 98.1 F (36.7 C)  TempSrc: Oral Oral Oral Oral  Resp: 18 18 18 18   Height:      Weight:      SpO2: 96% 97% 98% 97%   Exam:  General: He is alert and oriented x3, in no respiratory distress  Cardiovascular/chest: Regular rate and rhythm, normal S1-S2. Reproducible chest wall tenderness  Respiratory: He to auscultation bilaterally no crackles or wheezes  Abdomen: Soft, bowel sounds present nontender nondistended  Extremities: No cyanosis and no edema    Discharge Instructions  Discharge Orders    Future Orders Please Complete By Expires   Diet - low sodium heart healthy      Increase activity slowly          Medication List     As of 04/15/2012  2:36 PM    STOP taking these medications         metoprolol succinate 50 MG 24 hr tablet   Commonly known as: TOPROL-XL      TAKE these medications         acetaminophen 325 MG tablet   Commonly known as: TYLENOL   Take 2 tablets (650 mg total) by mouth every 4 (four) hours as needed (temperature >/= 99.5 F).      aspirin 81 MG chewable tablet   Chew 81 mg by mouth daily.      buPROPion 100 MG 12 hr tablet   Commonly known as: WELLBUTRIN SR   Take 200 mg by mouth daily.      carvedilol 3.125 MG tablet   Commonly known as: COREG   Take 1 tablet (3.125 mg total) by mouth 2 (two) times daily with a meal.      clonazePAM 1 MG tablet   Commonly known as: KLONOPIN   Take 1 mg by mouth at bedtime.      clopidogrel 75 MG tablet   Commonly known as: PLAVIX   Take 75 mg by mouth daily.      lisinopril 10 MG tablet    Commonly known as: PRINIVIL,ZESTRIL   Take 1 tablet (10 mg total) by mouth daily.      nitroGLYCERIN 0.4 MG SL tablet   Commonly known as: NITROSTAT   Place 0.4 mg under the tongue every 5 (five) minutes as needed. For chest pain      oxyCODONE-acetaminophen 5-325 MG per tablet   Commonly known as: PERCOCET/ROXICET   Take 1-2 tablets by mouth every 6 (six) hours as needed.      pravastatin 20 MG tablet   Commonly known as: PRAVACHOL   Take 10 mg by mouth at bedtime.      sertraline 100 MG tablet   Commonly known as: ZOLOFT   Take 100 mg by mouth 2 (two) times daily.  tiZANidine 4 MG tablet   Commonly known as: ZANAFLEX   Take 4 mg by mouth at bedtime.           Follow-up Information    Please follow up. (MD at [facility]  as scheduled next week)           The results of significant diagnostics from this hospitalization (including imaging, microbiology, ancillary and laboratory) are listed below for reference.    Significant Diagnostic Studies: Dg Ribs Unilateral W/chest Left  04/14/2012  *RADIOLOGY REPORT*  Clinical Data: History of fall 4 months ago, evaluate for left- sided rib fractures and/or pneumonia  LEFT RIBS AND CHEST - 3+ VIEW  Comparison: 11/21/2011; 11/20/2011  Findings: Unchanged cardiac silhouette and mediastinal contours and mild tortuosity the thoracic aorta.  Bibasilar heterogeneous opacity are grossly unchanged, favored to represent atelectasis. No new focal parenchymal opacity.  No pleural effusion or pneumothorax.  There is minimal deformity of a left rib superior to the radiopaque BB which may represent a remote rib fracture.  No acute displaced left-sided rib fractures.  IMPRESSION: 1.  Unchanged bibasilar heterogeneous opacities, left greater than right, favored to represent atelectasis or scar. 2.  Minimal deformity of a left-sided rib, superior to the radiopaque BB may represent a remote fracture.  No acute displaced left-sided rib fractures.   Original  Report Authenticated By: Waynard Reeds, M.D.    Ct Head Wo Contrast  04/13/2012  *RADIOLOGY REPORT*  Clinical Data: Terrible headache.  Difficulty to a focus of ice. Expressive aphasia.  Left arm and leg weakness.  CT HEAD WITHOUT CONTRAST  Technique:  Contiguous axial images were obtained from the base of the skull through the vertex without contrast.  Comparison: MRI brain 11/21/2011.  CT head 11/20/2011.  Findings: Ventricles and sulci appear symmetrical.  No mass effect or midline shift.  No abnormal extra-axial fluid collections.  Wallace Cullens- white matter junctions are distinct.  Basal cisterns are not effaced.  No evidence of acute intracranial hemorrhage.  No depressed skull fractures.  Visualized paranasal sinuses and mastoid air cells are not opacified.  No significant change since previous study.  IMPRESSION: No acute intracranial abnormalities.   Original Report Authenticated By: Marlon Pel, M.D.    Mri Brain Without Contrast  04/13/2012  *RADIOLOGY REPORT*  Clinical Data:  Aphasia  MRI HEAD WITHOUT CONTRAST MRA HEAD WITHOUT CONTRAST  Technique:  Multiplanar, multiecho pulse sequences of the brain and surrounding structures were obtained without intravenous contrast. Angiographic images of the head were obtained using MRA technique without contrast.  Comparison:  CT 04/13/2012, MRI 11/21/2011  MRI HEAD  Findings:  Normal brain volume for age.  Ventricle size is normal. Scattered small white matter hyperintensities, unchanged from the prior study.  No acute infarct.  Basal ganglia are normal.  Slight hyperintensity in the left pons is unchanged and appears chronic. Cerebellum is intact.  Negative for hemorrhage.  No intracranial mass or edema.  No shift of the midline structures.  Paranasal sinuses are clear.  IMPRESSION: Mild chronic microvascular ischemia.  No acute abnormality.  MRA HEAD  Findings: Both vertebral arteries are patent to the basilar.  The basilar and posterior cerebral  arteries are patent.  Patent posterior communicating arteries bilaterally.  Internal carotid artery is patent bilaterally.  Anterior and middle cerebral arteries are patent bilaterally.  Mild irregularity in the middle cerebral artery branches on the right compatible with atherosclerotic disease.  No large vessel occlusion or aneurysm.  IMPRESSION: Mild atherosclerotic irregularity  and right middle cerebral artery branches.  No large vessel occlusion or aneurysm.   Original Report Authenticated By: Camelia Phenes, M.D.    Mr Mra Head/brain Wo Cm  04/13/2012  *RADIOLOGY REPORT*  Clinical Data:  Aphasia  MRI HEAD WITHOUT CONTRAST MRA HEAD WITHOUT CONTRAST  Technique:  Multiplanar, multiecho pulse sequences of the brain and surrounding structures were obtained without intravenous contrast. Angiographic images of the head were obtained using MRA technique without contrast.  Comparison:  CT 04/13/2012, MRI 11/21/2011  MRI HEAD  Findings:  Normal brain volume for age.  Ventricle size is normal. Scattered small white matter hyperintensities, unchanged from the prior study.  No acute infarct.  Basal ganglia are normal.  Slight hyperintensity in the left pons is unchanged and appears chronic. Cerebellum is intact.  Negative for hemorrhage.  No intracranial mass or edema.  No shift of the midline structures.  Paranasal sinuses are clear.  IMPRESSION: Mild chronic microvascular ischemia.  No acute abnormality.  MRA HEAD  Findings: Both vertebral arteries are patent to the basilar.  The basilar and posterior cerebral arteries are patent.  Patent posterior communicating arteries bilaterally.  Internal carotid artery is patent bilaterally.  Anterior and middle cerebral arteries are patent bilaterally.  Mild irregularity in the middle cerebral artery branches on the right compatible with atherosclerotic disease.  No large vessel occlusion or aneurysm.  IMPRESSION: Mild atherosclerotic irregularity and right middle cerebral  artery branches.  No large vessel occlusion or aneurysm.   Original Report Authenticated By: Camelia Phenes, M.D.     Microbiology: Recent Results (from the past 240 hour(s))  MRSA PCR SCREENING     Status: Normal   Collection Time   04/13/12  6:28 AM      Component Value Range Status Comment   MRSA by PCR NEGATIVE  NEGATIVE Final      Labs: Basic Metabolic Panel:  Lab 04/15/12 1478 04/13/12 0630 04/12/12 2335  NA 141 -- 142  K 3.9 -- 4.0  CL 104 -- 106  CO2 30 -- 28  GLUCOSE 106* -- 121*  BUN 15 -- 25*  CREATININE 0.82 0.87 0.88  CALCIUM 8.9 -- 9.1  MG -- -- 2.0  PHOS -- -- --   Liver Function Tests:  Lab 04/12/12 2335  AST 13  ALT 14  ALKPHOS 91  BILITOT 0.2*  PROT 6.6  ALBUMIN 3.5   No results found for this basename: LIPASE:5,AMYLASE:5 in the last 168 hours No results found for this basename: AMMONIA:5 in the last 168 hours CBC:  Lab 04/13/12 0630 04/12/12 2335  WBC 6.3 7.5  NEUTROABS -- --  HGB 12.2* 12.3*  HCT 34.7* 35.1*  MCV 85.7 85.8  PLT 154 164   Cardiac Enzymes:  Lab 04/13/12 1808 04/13/12 1141 04/13/12 0630  CKTOTAL 71 -- --  CKMB 2.0 -- --  CKMBINDEX -- -- --  TROPONINI <0.30 <0.30 <0.30   BNP: BNP (last 3 results) No results found for this basename: PROBNP:3 in the last 8760 hours CBG:  Lab 04/14/12 0804 04/13/12 2129 04/13/12 1216 04/13/12 0744 04/13/12 0017  GLUCAP 105* 118* 115* 112* 122*    Time coordinating discharge: >30 minutes  Signed:  Osman Calzadilla C  [facility] 04/15/2012, 2:36 PM

## 2012-04-15 NOTE — Evaluation (Signed)
Physical Therapy Evaluation Patient Details Name: [name] [name] MRN: [id number] DOB: [birth year] Today's Date: 04/15/2012 Time: 1219-1252 PT Time Calculation (min): 33 min  PT Assessment / Plan / Recommendation Clinical Impression  [name] [name] is an 66 y.o. male with a history of posttraumatic stress disorder, coronary disease, transient ischemic attacks, atypical chest pain felt to be musculoskeletal in etiology and chronic headaches. Admitted to [facility] with left sided weakness, slurred speech and vision changes which have all since resolved with slight weakness residual on the left. Has been admitted before with similar symptoms. Per MRI and neurology this is not TIA/CVA.   Presents to PT today with left sided weakness as well as generalized weakness affecting his independence and safety with mobility. Will benefit physical therapy in the acute setting to address these so as to maxmize safe d/c home. Rec HHPT and for pt to utilize his RW initially until cleared by home therapist.     PT Assessment  Patient needs continued PT services    Follow Up Recommendations  Home health PT;Supervision for mobility/OOB    Does the patient have the potential to tolerate intense rehabilitation      Barriers to Discharge        Equipment Recommendations  None recommended by PT    Recommendations for Other Services     Frequency Min 3X/week    Precautions / Restrictions Precautions Precautions: Fall Restrictions Weight Bearing Restrictions: No         Mobility  Bed Mobility Bed Mobility: Supine to Sit Supine to Sit: 6: Modified independent (Device/Increase time);HOB elevated (60 degrees) Transfers Transfers: Sit to Stand;Stand to Sit Sit to Stand: 5: Supervision;With upper extremity assist;From bed Stand to Sit: 5: Supervision;With upper extremity assist;To bed Details for Transfer Assistance: cues for safety with [facility] Ambulation/Gait Ambulation/Gait Assistance: 4: Min  guard Ambulation Distance (Feet): 200 Feet Assistive device: Straight cane Ambulation/Gait Assistance Details: cues for tall posture and safe technique with SPC, pt tending to reaching out for LUE support to improve stability, educated pt on using RW until pt more stable at home, pt agreeable Gait Pattern: Lateral trunk lean to right Gait velocity: slowed General Gait Details: antalgic quality to gait with decreased stance time on the left, pt reports it just feels weak but did not notice any buckling on the left during gait Stairs: No (pt declined')              PT Diagnosis: Difficulty walking;Abnormality of gait;Generalized weakness  PT Problem List: Decreased strength;Decreased activity tolerance;Decreased balance;Decreased mobility;Impaired sensation PT Treatment Interventions: DME instruction;Gait training;Functional mobility training;Therapeutic activities;Therapeutic exercise;Balance training;Stair training;Patient/family education;Neuromuscular re-education   PT Goals Acute Rehab PT Goals PT Goal Formulation: With patient Time For Goal Achievement: 04/22/12 Potential to Achieve Goals: Good Pt will go Sit to Stand: with modified independence PT Goal: Sit to Stand - Progress: Goal set today Pt will go Stand to Sit: with modified independence PT Goal: Stand to Sit - Progress: Goal set today Pt will Transfer Bed to Chair/Chair to Bed: with modified independence PT Transfer Goal: Bed to Chair/Chair to Bed - Progress: Goal set today Pt will Ambulate: >150 feet;with modified independence;with least restrictive assistive device PT Goal: Ambulate - Progress: Goal set today Pt will Go Up / Down Stairs: 1-2 stairs;with supervision;with least restrictive assistive device PT Goal: Up/Down Stairs - Progress: Goal set today  Visit Information  Last PT Received On: 04/15/12 Assistance Needed: +1    Subjective Data  Subjective: This has been  happening since I was at the [place] and they made  me have a stroke.  Patient Stated Goal: home today   Prior Functioning  Home Living Lives With: Spouse Available Help at Discharge: Family;Available 24 hours/day Type of Home: House Home Access: Stairs to enter [employer] of Steps: 1 Entrance Stairs-Rails: None Home Layout: One level Bathroom Shower/Tub: Health visitor: Handicapped height (vanity right next to it) Bathroom Accessibility: Yes How Accessible: Accessible via walker Home Adaptive Equipment: Bedside commode/3-in-1;Shower chair with back;Walker - rolling;Straight cane Prior Function Level of Independence: Independent with assistive device(s);Independent Able to Take Stairs?: Yes Vocation: Retired Musician: No difficulties    Cognition  Overall Cognitive Status: Appears within functional limits for tasks assessed/performed Arousal/Alertness: Awake/alert Orientation Level: Appears intact for tasks assessed Behavior During Session: [facility] for tasks performed    Extremity/Trunk Assessment Right Upper Extremity Assessment RUE ROM/Strength/Tone: [facility] for tasks assessed Left Upper Extremity Assessment LUE ROM/Strength/Tone: Deficits LUE ROM/Strength/Tone Deficits: limited LUE AROM from prior injury, see OT note for details LUE Sensation: Deficits LUE Sensation Deficits: reports diminished LT LUE Coordination: WFL - gross/fine motor Right Lower Extremity Assessment RLE ROM/Strength/Tone: Deficits RLE ROM/Strength/Tone Deficits: grossly 4/5, hip strength when tested with LLE tested 3/5 Left Lower Extremity Assessment LLE ROM/Strength/Tone: Deficits LLE ROM/Strength/Tone Deficits: grossly 3/5 LLE Sensation: Deficits LLE Sensation Deficits: diminished LT LLE Coordination: WFL - gross/fine motor Trunk Assessment Trunk Assessment: Normal   Balance Balance Balance Assessed: Yes Static Standing Balance Static Standing - Balance Support: Right upper extremity supported Static  Standing - Level of Assistance: 5: Stand by assistance  End of Session PT - End of Session Equipment Utilized During Treatment: Gait belt Activity Tolerance: Patient tolerated treatment well;Patient limited by fatigue Patient left: in bed;with call bell/phone within reach;with family/visitor present Nurse Communication: Mobility status  GP     [facility] HELEN 04/15/2012, 1:40 PM

## 2012-04-15 NOTE — Progress Notes (Signed)
   CARE MANAGEMENT NOTE 04/15/2012  Patient:  [name] [name], [name] [name]   Account Number:  [id number]  Date Initiated:  04/15/2012  Documentation initiated by:  [facility]  Subjective/Objective Assessment:   CVA     Action/Plan:   lives with wife, pt seen at [facility]   Anticipated DC Date:  04/15/2012   Anticipated DC Plan:  HOME W HOME HEALTH SERVICES      DC Planning Services  CM consult      [facility] Choice  HOME HEALTH   Choice offered to / List presented to:  C-1 Patient        HH arranged  HH-2 PT      [facility] agency  [facility]   Status of service:  Completed, signed off Medicare Important Message given?   (If response is "NO", the following Medicare IM given date fields will be blank) Date Medicare IM given:   Date Additional Medicare IM given:    Discharge Disposition:  HOME W HOME HEALTH SERVICES  Per UR Regulation:    If discussed at Long Length of Stay Meetings, dates discussed:    Comments:  04/15/2012 1500 Pt states he had [facility] in the past for [facility] PT. NCM contacted [facility] for [facility] PT for scheduled d/c home today. Will fax referral to [facility]. [place] at [phone] and spoke to [place] ext [phone]. States pt's P[facility], [facility]. ext [phone] or [phone]. Attempted call to PCP and no answer. Transport planner for [employer] and left message for return call. Faxed [facility] Con[facility] with [facility] Isidoro Donning RN CCM Case Mgmt [phone]  04/15/2012 1200 Pt states he has DME at home. Has cane and RW. His PCP and he gets his meds from [facility]. Isidoro Donning RN CCM Case Mgmt phone [phone]

## 2013-03-19 DIAGNOSIS — Z85828 Personal history of other malignant neoplasm of skin: Secondary | ICD-10-CM | POA: Insufficient documentation

## 2013-06-06 ENCOUNTER — Other Ambulatory Visit (HOSPITAL_COMMUNITY): Payer: Self-pay | Admitting: Orthopaedic Surgery

## 2013-06-09 ENCOUNTER — Encounter (HOSPITAL_COMMUNITY): Payer: Self-pay | Admitting: *Deleted

## 2013-06-09 MED ORDER — CEFAZOLIN SODIUM-DEXTROSE 2-3 GM-% IV SOLR
2.0000 g | INTRAVENOUS | Status: AC
  Administered 2013-06-10: 2 g via INTRAVENOUS
  Filled 2013-06-09: qty 50

## 2013-06-09 NOTE — Progress Notes (Signed)
Sherri  From Dr Eliberto Ivory office called and said that Dr Pia Mau dit is ok that patient is still taking Plavix.

## 2013-06-09 NOTE — Progress Notes (Signed)
Patient continues to take Plavix.  Notes from [place] Cardiologist say he can stop it 7 days ahead.  I left a message on Sheri's voicemail with the information that patient has not stopped Plavix.  I faxed a request to [place] in [facility] requesting Sleep Study, Chest Xray , EKG and last office notes.

## 2013-06-10 ENCOUNTER — Ambulatory Visit (HOSPITAL_COMMUNITY)
Admission: RE | Admit: 2013-06-10 | Discharge: 2013-06-10 | Disposition: A | Discharge status: Home / Self Care | Payer: Non-veteran care | Source: Ambulatory Visit | Attending: Orthopaedic Surgery | Admitting: Orthopaedic Surgery

## 2013-06-10 ENCOUNTER — Ambulatory Visit (HOSPITAL_COMMUNITY): Payer: Non-veteran care | Admitting: Certified Registered Nurse Anesthetist

## 2013-06-10 ENCOUNTER — Ambulatory Visit (HOSPITAL_COMMUNITY): Payer: Non-veteran care

## 2013-06-10 ENCOUNTER — Encounter (HOSPITAL_COMMUNITY)
Admission: RE | Disposition: A | Discharge status: Home / Self Care | Payer: Self-pay | Source: Ambulatory Visit | Attending: Orthopaedic Surgery

## 2013-06-10 ENCOUNTER — Encounter (HOSPITAL_COMMUNITY): Payer: Self-pay | Admitting: Certified Registered Nurse Anesthetist

## 2013-06-10 ENCOUNTER — Encounter (HOSPITAL_COMMUNITY): Payer: Non-veteran care | Admitting: Certified Registered Nurse Anesthetist

## 2013-06-10 DIAGNOSIS — I10 Essential (primary) hypertension: Secondary | ICD-10-CM | POA: Insufficient documentation

## 2013-06-10 DIAGNOSIS — R29898 Other symptoms and signs involving the musculoskeletal system: Secondary | ICD-10-CM | POA: Insufficient documentation

## 2013-06-10 DIAGNOSIS — G4730 Sleep apnea, unspecified: Secondary | ICD-10-CM | POA: Insufficient documentation

## 2013-06-10 DIAGNOSIS — F3289 Other specified depressive episodes: Secondary | ICD-10-CM | POA: Insufficient documentation

## 2013-06-10 DIAGNOSIS — I252 Old myocardial infarction: Secondary | ICD-10-CM | POA: Insufficient documentation

## 2013-06-10 DIAGNOSIS — E7800 Pure hypercholesterolemia, unspecified: Secondary | ICD-10-CM | POA: Insufficient documentation

## 2013-06-10 DIAGNOSIS — I69998 Other sequelae following unspecified cerebrovascular disease: Secondary | ICD-10-CM | POA: Insufficient documentation

## 2013-06-10 DIAGNOSIS — F329 Major depressive disorder, single episode, unspecified: Secondary | ICD-10-CM | POA: Insufficient documentation

## 2013-06-10 DIAGNOSIS — I2510 Atherosclerotic heart disease of native coronary artery without angina pectoris: Secondary | ICD-10-CM | POA: Insufficient documentation

## 2013-06-10 DIAGNOSIS — Z01810 Encounter for preprocedural cardiovascular examination: Secondary | ICD-10-CM | POA: Insufficient documentation

## 2013-06-10 DIAGNOSIS — Z01812 Encounter for preprocedural laboratory examination: Secondary | ICD-10-CM | POA: Insufficient documentation

## 2013-06-10 DIAGNOSIS — F4310 Post-traumatic stress disorder, unspecified: Secondary | ICD-10-CM | POA: Insufficient documentation

## 2013-06-10 DIAGNOSIS — G5601 Carpal tunnel syndrome, right upper limb: Secondary | ICD-10-CM

## 2013-06-10 DIAGNOSIS — G5600 Carpal tunnel syndrome, unspecified upper limb: Secondary | ICD-10-CM | POA: Insufficient documentation

## 2013-06-10 DIAGNOSIS — Z87891 Personal history of nicotine dependence: Secondary | ICD-10-CM | POA: Insufficient documentation

## 2013-06-10 DIAGNOSIS — Z01818 Encounter for other preprocedural examination: Secondary | ICD-10-CM | POA: Insufficient documentation

## 2013-06-10 DIAGNOSIS — F411 Generalized anxiety disorder: Secondary | ICD-10-CM | POA: Insufficient documentation

## 2013-06-10 HISTORY — DX: Acute myocardial infarction, unspecified: I21.9

## 2013-06-10 HISTORY — DX: Sleep apnea, unspecified: G47.30

## 2013-06-10 HISTORY — DX: Post-traumatic stress disorder, unspecified: F43.10

## 2013-06-10 HISTORY — PX: CARPAL TUNNEL RELEASE: SHX101

## 2013-06-10 HISTORY — DX: Carpal tunnel syndrome, unspecified upper limb: G56.00

## 2013-06-10 LAB — BASIC METABOLIC PANEL
BUN: 15 mg/dL (ref 6–23)
Chloride: 105 mEq/L (ref 96–112)
Creatinine, Ser: 0.68 mg/dL (ref 0.50–1.35)
GFR calc Af Amer: >90 mL/min (ref >90)
GFR calc non Af Amer: >90 mL/min (ref >90)
Potassium: 4.1 mEq/L (ref 3.5–5.1)

## 2013-06-10 LAB — CBC
HCT: 36.4 % — ABNORMAL LOW (ref 39.0–52.0)
Hemoglobin: 13.3 g/dL (ref 13.0–17.0)
MCHC: 36.5 g/dL — ABNORMAL HIGH (ref 30.0–36.0)
MCV: 84.5 fL (ref 78.0–100.0)
RDW: 12 % (ref 11.5–15.5)
WBC: 6.1 10*3/uL (ref 4.0–10.5)

## 2013-06-10 SURGERY — CARPAL TUNNEL RELEASE
Anesthesia: Monitor Anesthesia Care | Site: Arm Lower | Laterality: Right

## 2013-06-10 MED ORDER — MIDAZOLAM HCL 5 MG/5ML IJ SOLN
INTRAMUSCULAR | Status: DC | PRN
  Administered 2013-06-10 (×2): 1 mg via INTRAVENOUS

## 2013-06-10 MED ORDER — PROPOFOL INFUSION 10 MG/ML OPTIME
INTRAVENOUS | Status: DC | PRN
  Administered 2013-06-10: 50 ug/kg/min via INTRAVENOUS

## 2013-06-10 MED ORDER — FENTANYL CITRATE 0.05 MG/ML IJ SOLN
INTRAMUSCULAR | Status: DC | PRN
  Administered 2013-06-10: 25 ug via INTRAVENOUS
  Administered 2013-06-10 (×2): 50 ug via INTRAVENOUS

## 2013-06-10 MED ORDER — LACTATED RINGERS IV SOLN
INTRAVENOUS | Status: DC | PRN
  Administered 2013-06-10: 12:00:00 via INTRAVENOUS

## 2013-06-10 MED ORDER — BUPIVACAINE HCL (PF) 0.25 % IJ SOLN
INTRAMUSCULAR | Status: AC
  Filled 2013-06-10: qty 30

## 2013-06-10 MED ORDER — ONDANSETRON HCL 4 MG/2ML IJ SOLN
INTRAMUSCULAR | Status: DC | PRN
  Administered 2013-06-10: 4 mg via INTRAVENOUS

## 2013-06-10 MED ORDER — BUPIVACAINE HCL 0.25 % IJ SOLN
INTRAMUSCULAR | Status: DC | PRN
  Administered 2013-06-10: 17 mL

## 2013-06-10 MED ORDER — 0.9 % SODIUM CHLORIDE (POUR BTL) OPTIME
TOPICAL | Status: DC | PRN
  Administered 2013-06-10: 1000 mL

## 2013-06-10 MED ORDER — LIDOCAINE HCL (PF) 1 % IJ SOLN
INTRAMUSCULAR | Status: AC
  Filled 2013-06-10: qty 30

## 2013-06-10 MED ORDER — MEPERIDINE HCL 25 MG/ML IJ SOLN: 6.2500 mg | INTRAMUSCULAR | Status: DC | PRN

## 2013-06-10 MED ORDER — PROMETHAZINE HCL 25 MG/ML IJ SOLN: 6.2500 mg | INTRAMUSCULAR | Status: DC | PRN

## 2013-06-10 MED ORDER — LACTATED RINGERS IV SOLN
INTRAVENOUS | Status: DC
  Administered 2013-06-10: 10:00:00 via INTRAVENOUS

## 2013-06-10 MED ORDER — HYDROCODONE-ACETAMINOPHEN 5-325 MG PO TABS: 1.0000 | ORAL_TABLET | ORAL | Status: DC | PRN

## 2013-06-10 MED ORDER — LIDOCAINE HCL 1 % IJ SOLN
INTRAMUSCULAR | Status: DC | PRN
  Administered 2013-06-10: 6 mL

## 2013-06-10 MED ORDER — FENTANYL CITRATE 0.05 MG/ML IJ SOLN: 25.0000 ug | INTRAMUSCULAR | Status: DC | PRN

## 2013-06-10 SURGICAL SUPPLY — 34 items
BANDAGE ELASTIC 3 VELCRO ST LF (GAUZE/BANDAGES/DRESSINGS) ×4 IMPLANT
BANDAGE ELASTIC 4 VELCRO ST LF (GAUZE/BANDAGES/DRESSINGS) ×2 IMPLANT
BANDAGE GAUZE ELAST BULKY 4 IN (GAUZE/BANDAGES/DRESSINGS) ×2 IMPLANT
CLOTH BEACON ORANGE TIMEOUT ST (SAFETY) ×2 IMPLANT
CORDS BIPOLAR (ELECTRODE) ×2 IMPLANT
COVER SURGICAL LIGHT HANDLE (MISCELLANEOUS) ×2 IMPLANT
CUFF TOURNIQUET SINGLE 18IN (TOURNIQUET CUFF) ×2 IMPLANT
CUFF TOURNIQUET SINGLE 24IN (TOURNIQUET CUFF) IMPLANT
DRAPE SURG 17X23 STRL (DRAPES) ×2 IMPLANT
DURAPREP 26ML APPLICATOR (WOUND CARE) ×2 IMPLANT
GAUZE XEROFORM 1X8 LF (GAUZE/BANDAGES/DRESSINGS) ×2 IMPLANT
GLOVE BIO SURGEON STRL SZ8 (GLOVE) ×2 IMPLANT
GLOVE ORTHO TXT STRL SZ7.5 (GLOVE) ×2 IMPLANT
GOWN PREVENTION PLUS LG XLONG (DISPOSABLE) IMPLANT
GOWN PREVENTION PLUS XLARGE (GOWN DISPOSABLE) ×4 IMPLANT
GOWN STRL NON-REIN LRG LVL3 (GOWN DISPOSABLE) ×4 IMPLANT
KIT BASIN OR (CUSTOM PROCEDURE TRAY) ×2 IMPLANT
KIT ROOM TURNOVER OR (KITS) ×2 IMPLANT
NEEDLE HYPO 25GX1X1/2 BEV (NEEDLE) IMPLANT
NS IRRIG 1000ML POUR BTL (IV SOLUTION) ×2 IMPLANT
PACK ORTHO EXTREMITY (CUSTOM PROCEDURE TRAY) ×2 IMPLANT
PAD ARMBOARD 7.5X6 YLW CONV (MISCELLANEOUS) ×4 IMPLANT
PAD CAST 4YDX4 CTTN HI CHSV (CAST SUPPLIES) ×2 IMPLANT
PADDING CAST COTTON 4X4 STRL (CAST SUPPLIES) ×2
SPONGE GAUZE 4X4 12PLY (GAUZE/BANDAGES/DRESSINGS) ×2 IMPLANT
SUCTION FRAZIER TIP 10 FR DISP (SUCTIONS) IMPLANT
SUT ETHILON 3 0 PS 1 (SUTURE) ×2 IMPLANT
SUT ETHILON 4 0 PS 2 18 (SUTURE) IMPLANT
SYR CONTROL 10ML LL (SYRINGE) IMPLANT
TOWEL OR 17X24 6PK STRL BLUE (TOWEL DISPOSABLE) ×2 IMPLANT
TOWEL OR 17X26 10 PK STRL BLUE (TOWEL DISPOSABLE) ×2 IMPLANT
TUBE CONNECTING 12X1/4 (SUCTIONS) IMPLANT
UNDERPAD 30X30 INCONTINENT (UNDERPADS AND DIAPERS) ×2 IMPLANT
WATER STERILE IRR 1000ML POUR (IV SOLUTION) ×2 IMPLANT

## 2013-06-10 NOTE — Brief Op Note (Signed)
06/10/2013  12:22 PM  PATIENT:  [name] [name]  67 y.o. male  PRE-OPERATIVE DIAGNOSIS:  Severe right carpal tunnel syndrome  POST-OPERATIVE DIAGNOSIS:  severe right carpal tunnel syndrome  PROCEDURE:  Procedure(s): RIGHT CARPAL TUNNEL RELEASE (Right)  SURGEON:  Surgeon(s) and Role:    * Kathryne Hitch, MD - Primary  PHYSICIAN ASSISTANT: Rexene Edison, PA-C  ANESTHESIA:   local and IV sedation  EBL:  Total I/O In: 400 [I.V.:400] Out: -   BLOOD ADMINISTERED:none  DRAINS: none   LOCAL MEDICATIONS USED:  MARCAINE    and LIDOCAINE   SPECIMEN:  No Specimen  DISPOSITION OF SPECIMEN:  N/A  COUNTS:  YES  TOURNIQUET:   Total Tourniquet Time Documented: Upper Arm (Right) - 15 minutes Total: Upper Arm (Right) - 15 minutes   DICTATION: .Other Dictation: Dictation Number [id number]  PLAN OF CARE: Discharge to home after PACU  PATIENT DISPOSITION:  PACU - hemodynamically stable.   Delay start of Pharmacological VTE agent (>24hrs) due to surgical blood loss or risk of bleeding: not applicable

## 2013-06-10 NOTE — Anesthesia Postprocedure Evaluation (Signed)
Anesthesia Post Note  Patient: [name] [name]  Procedure(s) Performed: Procedure(s) (LRB): RIGHT CARPAL TUNNEL RELEASE (Right)  Anesthesia type: General  Patient location: PACU  Post pain: Pain level controlled  Post assessment: Post-op Vital signs reviewed  Last Vitals: BP 130/74  Pulse 55  Temp(Src) 35.8 C (Oral)  Resp 15  Ht 5\' 10"  (1.778 m)  Wt 222 lb 2 oz (100.755 kg)  BMI 31.87 kg/m2  SpO2 95%  Post vital signs: Reviewed  Level of consciousness: sedated  Complications: No apparent anesthesia complications

## 2013-06-10 NOTE — Anesthesia Preprocedure Evaluation (Signed)
Anesthesia Evaluation  Patient identified by MRN, date of birth, ID band Patient awake    Reviewed: Allergy & Precautions, H&P , NPO status , Patient's Chart, lab work & pertinent test results, reviewed documented beta blocker date and time   Airway Mallampati: II TM Distance: >3 FB Neck ROM: Full    Dental  (+) Dental Advisory Given and Teeth Intact   Pulmonary sleep apnea , pneumonia -, resolved, former smoker,  breath sounds clear to auscultation        Cardiovascular hypertension, Pt. on medications and Pt. on home beta blockers + CAD, + Past MI and + Peripheral Vascular Disease Rhythm:Regular Rate:Normal     Neuro/Psych PSYCHIATRIC DISORDERS Anxiety Depression TIACVA, Residual Symptoms negative neurological ROS     GI/Hepatic negative GI ROS, (+) Hepatitis -  Endo/Other  negative endocrine ROS  Renal/GU negative Renal ROS     Musculoskeletal negative musculoskeletal ROS (+)   Abdominal   Peds  Hematology  (+) Blood dyscrasia, anemia ,   Anesthesia Other Findings   Reproductive/Obstetrics                           Anesthesia Physical Anesthesia Plan  ASA: III  Anesthesia Plan: MAC   Post-op Pain Management:    Induction: Intravenous  Airway Management Planned: Simple Face Mask  Additional Equipment:   Intra-op Plan:   Post-operative Plan:   Informed Consent: I have reviewed the patients History and Physical, chart, labs and discussed the procedure including the risks, benefits and alternatives for the proposed anesthesia with the patient or authorized representative who has indicated his/her understanding and acceptance.   Dental advisory given  Plan Discussed with: CRNA  Anesthesia Plan Comments:         Anesthesia Quick Evaluation

## 2013-06-10 NOTE — H&P (Signed)
[name] [name] is an 67 y.o. male.   Chief Complaint:   Right hand pain, numbness and tingling HPI: 67 yo male with multiple medical problems as well as quite severe right carpal tunnel syndrome confirmed with nerve conduction studies.  Due to his daily pain, numbness, and profound weakness, he wishes to proceed with a right open carpal tunnel release.  He understands fully the risks of surgery given his medical problems and does wish to proceed.  Past Medical History  Diagnosis Date  . Coronary artery disease   . Pneumonia   . Anxiety   . Depression   . Hypertension   . Hypercholesteremia   . Myocardial infarction 2010ish  . PTSD (post-traumatic stress disorder)   . Sleep apnea      doesnt use CPAP  . Stroke     slight weakness left- 85- 90 % returned  . Carpal tunnel syndrome   . Hepatitis     Past Surgical History  Procedure Laterality Date  . Cardiac catheterization    . Coronary angioplasty    . Stents  2012  . Eye surgery Bilateral     cataract  . Knee arthroscopy Right     Family History  Problem Relation Age of Onset  . Heart disease Mother     Several other maternal family members   Social History:  reports that he quit smoking about 25 years ago. His smoking use included Cigarettes. He has a 40 pack-year smoking history. He has never used smokeless tobacco. He reports that he does not drink alcohol or use illicit drugs.  Allergies:  Allergies  Allergen Reactions  . Clindamycin/Lincomycin Other (See Comments)    Blisters in mouth  . Dilaudid [Hydromorphone Hcl] Other (See Comments)    "CRASHES" per patient Talked to nurse and she said pt is afraid to take dilaudid because pt states he flat-lined when they gave it to him.Marland KitchenMarland KitchenPt doesn't mind taking Percocet     No prescriptions prior to admission    No results found for this or any previous visit (from the past 48 hour(s)). No results found.  Review of Systems  All other systems reviewed and are  negative.    There were no vitals taken for this visit. Physical Exam  Constitutional: He is oriented to person, place, and time. He appears well-developed and well-nourished.  HENT:  Head: Normocephalic and atraumatic.  Eyes: EOM are normal. Pupils are equal, round, and reactive to light.  Neck: Normal range of motion. Neck supple.  Cardiovascular: Normal rate.   Respiratory: Effort normal and breath sounds normal.  GI: Soft. Bowel sounds are normal.  Musculoskeletal:       Right hand: Decreased sensation noted. Decreased sensation is present in the medial distribution. Decreased strength noted.  Neurological: He is alert and oriented to person, place, and time.  Skin: Skin is warm and dry.  Psychiatric: He has a normal mood and affect.     Assessment/Plan Severe right carpal tunnel syndrome 1)  To the OR today for a right open carpal tunnel release under hopefully local anesthesia and MAC.  Hlee Fringer Y 06/10/2013, 7:50 AM

## 2013-06-10 NOTE — Progress Notes (Signed)
Orthopedic Tech Progress Note Patient Details:  [name] [name] 1945/08/25 [id number] PACU short stay nurse stated patient requested arm sling after carpal tunnel procedure for sore wrist/shoulder Ortho Devices Type of Ortho Device: Arm sling Ortho Device/Splint Interventions: Ordered   Asia R Thompson 06/10/2013, 1:22 PM

## 2013-06-10 NOTE — Anesthesia Procedure Notes (Signed)
Procedure Name: MAC Date/Time: 06/10/2013 11:57 AM Performed by: Gayla Medicus Pre-anesthesia Checklist: Patient identified, Patient being monitored, Timeout performed, Emergency Drugs available and Suction available Patient Re-evaluated:Patient Re-evaluated prior to inductionOxygen Delivery Method: Simple face mask Preoxygenation: Pre-oxygenation with 100% oxygen Dental Injury: Teeth and Oropharynx as per pre-operative assessment

## 2013-06-10 NOTE — Transfer of Care (Signed)
Immediate Anesthesia Transfer of Care Note  Patient: [name] [name]  Procedure(s) Performed: Procedure(s): RIGHT CARPAL TUNNEL RELEASE (Right)  Patient Location: PACU  Anesthesia Type:MAC  Level of Consciousness: awake, alert  and oriented  Airway & Oxygen Therapy: Patient Spontanous Breathing and Patient connected to nasal cannula oxygen  Post-op Assessment: Report given to PACU RN, Post -op Vital signs reviewed and stable and Patient moving all extremities X 4  Post vital signs: Reviewed and stable  Complications: No apparent anesthesia complications

## 2013-06-10 NOTE — Preoperative (Signed)
Beta Blockers   Reason not to administer Beta Blockers:Not Applicable 

## 2013-06-11 NOTE — Op Note (Signed)
NAME:  [name] [name], STATES NO.:  [id number]  MEDICAL RECORD NO.:  [id number]  LOCATION:  [facility]                         FACILITY:  [facility]  PHYSICIAN:  Vanita Panda. Magnus Ivan, M.D.DATE OF BIRTH:  17-Jan-1946  DATE OF PROCEDURE:  06/10/2013 DATE OF DISCHARGE:  06/10/2013                              OPERATIVE REPORT   PREOPERATIVE DIAGNOSIS:  Severe carpal tunnel syndrome, right upper extremity.  POSTOPERATIVE DIAGNOSIS:  Severe carpal tunnel syndrome, right upper extremity.  PROCEDURE:  Right open carpal tunnel release.  SURGEON:  Vanita Panda. Magnus Ivan, M.D.  ASSISTANT:  Richardean Canal, PA-C.  ANESTHESIA: 1. Mask ventilation IV sedation. 2. Local with a mixture of 1% plain lidocaine followed by 0.25% plain     Marcaine.  TOURNIQUET TIME:  Less than 10 minutes.  BLOOD LOSS:  Minimal.  COMPLICATIONS:  None.  INDICATION:  Mr. Chimento is a 66 year old gentleman with severe carpal tunnel syndrome.  It has been well documented with EMG and nerve conduction studies.  He was going to have a carpal tunnel release some time ago, but then had a stroke and other health issues, they kept him out of the surgery.  He is an avid Psychologist, clinical and he has gotten to where his pain is daily.  His numbness and tingling is severe and his weakness is profound.  At this point, we felt like we could do this surgery in an inpatient setting just to watch him from a standpoint of making sure this is not a hit on his system, but could also try mask ventilation IV sedation and make him comfortable with using minimal anesthesia.  He did agree to this as well as anesthesia and we felt it was appropriate to proceed to the OR given his profound symptoms.  PROCEDURE DESCRIPTION:  After informed consent was obtained, appropriate right hand was marked.  He was brought to the operating, placed supine on operative table with the right arm on arm table.  His nonsterile tourniquet placed around  his upper right arm and his right hand wrist were prepped and draped with DuraPrep and sterile drapes.  Time-out was called to identify correct patient, correct hand wrist.  We used an Esmarch wrap out the arm and tourniquet was inflated to 250 mm of pressure.  Mask ventilation IV sedation was obtained, and we used the mixture of first 1% plain lidocaine injected into the wrist and the palm followed by 0.25% plain Marcaine.  We then made an incision in the palm and the hand and dissected down to the distal edge of the transverse carpal ligament and found a very tight transverse carpal ligament.  This we meticulously divided to expose the median nerve.  We then irrigated the soft tissue with normal saline solution.  After exploring the median nerve, we closed the skin with interrupted 3-0 nylon suture.  Xeroform and well-padded sterile dressing was applied.  The tourniquet was let down. The fingers pinked nicely.  He was taken to recovery room in stable condition.  All final counts were correct.  There were no complications noted.  Postoperatively, we feel comfortable to let him go home today with ice and elevation.  Follow up in  the office in 2 weeks.     Vanita Panda. Magnus Ivan, M.D.     CYB/MEDQ  D:  06/10/2013  T:  06/11/2013  Job:  [id number]

## 2013-06-12 ENCOUNTER — Encounter (HOSPITAL_COMMUNITY): Payer: Self-pay | Admitting: Orthopaedic Surgery

## 2013-06-24 ENCOUNTER — Encounter: Payer: Self-pay | Admitting: Podiatry

## 2013-06-24 ENCOUNTER — Ambulatory Visit (INDEPENDENT_AMBULATORY_CARE_PROVIDER_SITE_OTHER)

## 2013-06-24 ENCOUNTER — Telehealth: Payer: Self-pay | Admitting: *Deleted

## 2013-06-24 ENCOUNTER — Ambulatory Visit (INDEPENDENT_AMBULATORY_CARE_PROVIDER_SITE_OTHER): Admitting: Podiatry

## 2013-06-24 VITALS — BP 136/81 | HR 65 | Resp 14 | Ht 70.0 in | Wt 220.0 lb

## 2013-06-24 DIAGNOSIS — L608 Other nail disorders: Secondary | ICD-10-CM | POA: Diagnosis not present

## 2013-06-24 DIAGNOSIS — M79672 Pain in left foot: Secondary | ICD-10-CM

## 2013-06-24 DIAGNOSIS — B353 Tinea pedis: Secondary | ICD-10-CM

## 2013-06-24 DIAGNOSIS — M79609 Pain in unspecified limb: Secondary | ICD-10-CM | POA: Diagnosis not present

## 2013-06-24 MED ORDER — ECONAZOLE NITRATE 1 % EX CREA: TOPICAL_CREAM | Freq: Every day | CUTANEOUS | Status: DC

## 2013-06-24 NOTE — Telephone Encounter (Signed)
B/L 1st toenail sent to [facility] [phone] for fungal testing.

## 2013-06-24 NOTE — Progress Notes (Signed)
   Subjective:    Patient ID: [name] [name], male    DOB: [birth year], 67 y.o.   MRN: [id number]  HPI Comments: My foot on the inside not the outside ,but it gets a burning and itching sensation , when it happens you can feel bumps underneath the skin, the only relief i get is if i stand on a ice pack for as long as i can stand it. And both of my big toes i think i have a fungus . It's been going on for the last two years and as time goes on it gets worse. But it comes and goes.   Foot Pain      Review of Systems  Musculoskeletal: Positive for back pain.       Objective:   Physical Exam: I have reviewed his past medical history medications allergies surgical history family history and review of systems. Vital signs are stable he is alert and oriented x3. Recently had carpal tunnel repair right hand. Pulses are strongly palpable bilateral lower extremity. Neurologic sensorium is intact bilateral. Deep tendon reflexes intact bilateral. Muscle strength + over 5 dorsiflexors plantar flexors inverters everters all intrinsic musculature is intact. 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 Cutaneous evaluation demonstrates is not dry xerotic skin with multiple vesicular eruption is plantar aspect medial longitudinal arch left foot. Nail plates are thick yellow dystrophic possibly mycotic.        Assessment & Plan:  Assessment: Tinea pedis plantar aspect of the medial longitudinal arch left rule out dyshidrotic eczema left foot. Dystrophic nails 1 through 5 bilateral. 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  Plan: Samples were taken from each hallux today and sent for mycotic evaluation. I also sent her prescription for Spectazole cream to be applied twice a day to his left foot. I will followup with him once his mycotic report returns

## 2013-07-24 ENCOUNTER — Telehealth: Payer: Self-pay | Admitting: *Deleted

## 2013-07-24 NOTE — Telephone Encounter (Signed)
Message copied by Marissa Nestle'CONNELL, Ciella Obi D on Thu Jul 24, 2013 10:06 AM ------      Message from: [place] ShoresSMITH, Shanda BumpsJESSICA M      Created: Wed Jul 23, 2013  4:48 PM      Regarding: lab results      Contact: [phone]       This patient saw Dr. Al CorpusHyatt on 06-24-13 and had some things sent to be tested. The patient has never heard from anybody and requests to be called back today. Please call back today!            Shanda BumpsJessica ------

## 2013-07-24 NOTE — Telephone Encounter (Signed)
I informed pt the fungal testing could take 4 - 6 weeks to return and we would call with instructions.  Pt states understanding.

## 2013-07-25 ENCOUNTER — Emergency Department (HOSPITAL_COMMUNITY): Payer: Non-veteran care

## 2013-07-25 ENCOUNTER — Observation Stay (HOSPITAL_COMMUNITY)
Admission: EM | Admit: 2013-07-25 | Discharge: 2013-07-26 | Disposition: A | Discharge status: Home / Self Care | Payer: Non-veteran care | Attending: Internal Medicine | Admitting: Internal Medicine

## 2013-07-25 ENCOUNTER — Encounter (HOSPITAL_COMMUNITY): Payer: Self-pay | Admitting: Emergency Medicine

## 2013-07-25 DIAGNOSIS — I252 Old myocardial infarction: Secondary | ICD-10-CM | POA: Insufficient documentation

## 2013-07-25 DIAGNOSIS — Z7902 Long term (current) use of antithrombotics/antiplatelets: Secondary | ICD-10-CM | POA: Insufficient documentation

## 2013-07-25 DIAGNOSIS — D696 Thrombocytopenia, unspecified: Secondary | ICD-10-CM

## 2013-07-25 DIAGNOSIS — G459 Transient cerebral ischemic attack, unspecified: Secondary | ICD-10-CM

## 2013-07-25 DIAGNOSIS — G4730 Sleep apnea, unspecified: Secondary | ICD-10-CM | POA: Insufficient documentation

## 2013-07-25 DIAGNOSIS — R0789 Other chest pain: Secondary | ICD-10-CM

## 2013-07-25 DIAGNOSIS — F4310 Post-traumatic stress disorder, unspecified: Secondary | ICD-10-CM | POA: Insufficient documentation

## 2013-07-25 DIAGNOSIS — E7800 Pure hypercholesterolemia, unspecified: Secondary | ICD-10-CM | POA: Insufficient documentation

## 2013-07-25 DIAGNOSIS — R4701 Aphasia: Secondary | ICD-10-CM

## 2013-07-25 DIAGNOSIS — R0602 Shortness of breath: Secondary | ICD-10-CM | POA: Insufficient documentation

## 2013-07-25 DIAGNOSIS — E860 Dehydration: Secondary | ICD-10-CM

## 2013-07-25 DIAGNOSIS — R69 Illness, unspecified: Secondary | ICD-10-CM

## 2013-07-25 DIAGNOSIS — D649 Anemia, unspecified: Secondary | ICD-10-CM

## 2013-07-25 DIAGNOSIS — F411 Generalized anxiety disorder: Secondary | ICD-10-CM | POA: Insufficient documentation

## 2013-07-25 DIAGNOSIS — Z8673 Personal history of transient ischemic attack (TIA), and cerebral infarction without residual deficits: Secondary | ICD-10-CM | POA: Insufficient documentation

## 2013-07-25 DIAGNOSIS — I2510 Atherosclerotic heart disease of native coronary artery without angina pectoris: Secondary | ICD-10-CM

## 2013-07-25 DIAGNOSIS — F3289 Other specified depressive episodes: Secondary | ICD-10-CM | POA: Insufficient documentation

## 2013-07-25 DIAGNOSIS — G9341 Metabolic encephalopathy: Secondary | ICD-10-CM

## 2013-07-25 DIAGNOSIS — F329 Major depressive disorder, single episode, unspecified: Secondary | ICD-10-CM | POA: Insufficient documentation

## 2013-07-25 DIAGNOSIS — K759 Inflammatory liver disease, unspecified: Secondary | ICD-10-CM | POA: Insufficient documentation

## 2013-07-25 DIAGNOSIS — H538 Other visual disturbances: Secondary | ICD-10-CM

## 2013-07-25 DIAGNOSIS — G5600 Carpal tunnel syndrome, unspecified upper limb: Secondary | ICD-10-CM

## 2013-07-25 DIAGNOSIS — G450 Vertebro-basilar artery syndrome: Secondary | ICD-10-CM

## 2013-07-25 DIAGNOSIS — I639 Cerebral infarction, unspecified: Secondary | ICD-10-CM

## 2013-07-25 DIAGNOSIS — Z87891 Personal history of nicotine dependence: Secondary | ICD-10-CM | POA: Insufficient documentation

## 2013-07-25 DIAGNOSIS — J069 Acute upper respiratory infection, unspecified: Principal | ICD-10-CM

## 2013-07-25 DIAGNOSIS — Z7982 Long term (current) use of aspirin: Secondary | ICD-10-CM | POA: Insufficient documentation

## 2013-07-25 DIAGNOSIS — R4781 Slurred speech: Secondary | ICD-10-CM

## 2013-07-25 DIAGNOSIS — J111 Influenza due to unidentified influenza virus with other respiratory manifestations: Secondary | ICD-10-CM

## 2013-07-25 DIAGNOSIS — I10 Essential (primary) hypertension: Secondary | ICD-10-CM | POA: Insufficient documentation

## 2013-07-25 LAB — COMPREHENSIVE METABOLIC PANEL
ALK PHOS: 81 U/L (ref 39–117)
ALT: 29 U/L (ref 0–53)
AST: 25 U/L (ref 0–37)
Albumin: 3.6 g/dL (ref 3.5–5.2)
BILIRUBIN TOTAL: 0.6 mg/dL (ref 0.3–1.2)
BUN: 14 mg/dL (ref 6–23)
CHLORIDE: 102 meq/L (ref 96–112)
CO2: 26 mEq/L (ref 19–32)
Calcium: 8.9 mg/dL (ref 8.4–10.5)
Creatinine, Ser: 0.79 mg/dL (ref 0.50–1.35)
GFR calc Af Amer: >90 mL/min (ref >90)
GFR calc non Af Amer: 90 mL/min — ABNORMAL LOW (ref >90)
Glucose, Bld: 124 mg/dL — ABNORMAL HIGH (ref 70–99)
POTASSIUM: 4.6 meq/L (ref 3.7–5.3)
SODIUM: 142 meq/L (ref 137–147)
TOTAL PROTEIN: 7.5 g/dL (ref 6.0–8.3)

## 2013-07-25 LAB — TROPONIN I: Troponin I: <0.3 ng/mL (ref <0.30)

## 2013-07-25 LAB — URINALYSIS, ROUTINE W REFLEX MICROSCOPIC
BILIRUBIN URINE: NEGATIVE
GLUCOSE, UA: NEGATIVE mg/dL
HGB URINE DIPSTICK: NEGATIVE
Ketones, ur: NEGATIVE mg/dL
Leukocytes, UA: NEGATIVE
Nitrite: NEGATIVE
PH: 5 (ref 5.0–8.0)
Protein, ur: NEGATIVE mg/dL
Specific Gravity, Urine: 1.025 (ref 1.005–1.030)
Urobilinogen, UA: 0.2 mg/dL (ref 0.0–1.0)

## 2013-07-25 LAB — CBC WITH DIFFERENTIAL/PLATELET
BASOS ABS: 0 10*3/uL (ref 0.0–0.1)
BASOS PCT: 0 % (ref 0–1)
Eosinophils Absolute: 0.2 10*3/uL (ref 0.0–0.7)
Eosinophils Relative: 2 % (ref 0–5)
HCT: 38.5 % — ABNORMAL LOW (ref 39.0–52.0)
HEMOGLOBIN: 14 g/dL (ref 13.0–17.0)
Lymphocytes Relative: 13 % (ref 12–46)
Lymphs Abs: 1.1 10*3/uL (ref 0.7–4.0)
MCH: 30.8 pg (ref 26.0–34.0)
MCHC: 36.4 g/dL — AB (ref 30.0–36.0)
MCV: 84.6 fL (ref 78.0–100.0)
MONOS PCT: 9 % (ref 3–12)
Monocytes Absolute: 0.8 10*3/uL (ref 0.1–1.0)
NEUTROS ABS: 6.9 10*3/uL (ref 1.7–7.7)
NEUTROS PCT: 76 % (ref 43–77)
PLATELETS: 143 10*3/uL — AB (ref 150–400)
RBC: 4.55 MIL/uL (ref 4.22–5.81)
RDW: 11.8 % (ref 11.5–15.5)
WBC: 9.1 10*3/uL (ref 4.0–10.5)

## 2013-07-25 LAB — INFLUENZA PANEL BY PCR (TYPE A & B)
H1N1FLUPCR: NOT DETECTED
INFLBPCR: NEGATIVE
Influenza A By PCR: NEGATIVE

## 2013-07-25 LAB — PRO B NATRIURETIC PEPTIDE: Pro B Natriuretic peptide (BNP): 7.7 pg/mL (ref 0–125)

## 2013-07-25 MED ORDER — ENOXAPARIN SODIUM 40 MG/0.4ML ~~LOC~~ SOLN
40.0000 mg | SUBCUTANEOUS | Status: DC
  Administered 2013-07-25: 40 mg via SUBCUTANEOUS
  Filled 2013-07-25 (×2): qty 0.4

## 2013-07-25 MED ORDER — HYDROCODONE-ACETAMINOPHEN 5-325 MG PO TABS
1.0000 | ORAL_TABLET | ORAL | Status: DC | PRN
  Administered 2013-07-26: 2 via ORAL
  Administered 2013-07-26: 1 via ORAL
  Filled 2013-07-25: qty 1
  Filled 2013-07-25: qty 2

## 2013-07-25 MED ORDER — ACETAMINOPHEN 650 MG RE SUPP: 650.0000 mg | Freq: Four times a day (QID) | RECTAL | Status: DC | PRN

## 2013-07-25 MED ORDER — SIMVASTATIN 5 MG PO TABS
5.0000 mg | ORAL_TABLET | Freq: Every day | ORAL | Status: DC
  Administered 2013-07-25: 5 mg via ORAL
  Filled 2013-07-25 (×2): qty 1

## 2013-07-25 MED ORDER — SERTRALINE HCL 100 MG PO TABS
100.0000 mg | ORAL_TABLET | Freq: Every day | ORAL | Status: DC
  Administered 2013-07-25: 100 mg via ORAL
  Filled 2013-07-25 (×2): qty 1

## 2013-07-25 MED ORDER — FLUTICASONE PROPIONATE 50 MCG/ACT NA SUSP
1.0000 | Freq: Every day | NASAL | Status: DC
  Administered 2013-07-25 – 2013-07-26 (×2): 1 via NASAL
  Filled 2013-07-25: qty 16

## 2013-07-25 MED ORDER — MORPHINE SULFATE 2 MG/ML IJ SOLN: 2.0000 mg | INTRAMUSCULAR | Status: DC | PRN

## 2013-07-25 MED ORDER — NITROGLYCERIN 0.4 MG SL SUBL: 0.4000 mg | SUBLINGUAL_TABLET | SUBLINGUAL | Status: DC | PRN

## 2013-07-25 MED ORDER — HYDROCODONE-ACETAMINOPHEN 5-325 MG PO TABS: 1.0000 | ORAL_TABLET | ORAL | Status: DC | PRN

## 2013-07-25 MED ORDER — ACETAMINOPHEN 325 MG PO TABS
650.0000 mg | ORAL_TABLET | Freq: Once | ORAL | Status: AC
  Administered 2013-07-25: 650 mg via ORAL
  Filled 2013-07-25: qty 2

## 2013-07-25 MED ORDER — ONDANSETRON HCL 4 MG/2ML IJ SOLN: 4.0000 mg | Freq: Four times a day (QID) | INTRAMUSCULAR | Status: DC | PRN

## 2013-07-25 MED ORDER — GUAIFENESIN-CODEINE 100-10 MG/5ML PO SOLN
5.0000 mL | Freq: Once | ORAL | Status: AC
  Administered 2013-07-25: 5 mL via ORAL
  Filled 2013-07-25: qty 5

## 2013-07-25 MED ORDER — SODIUM CHLORIDE 0.9 % IJ SOLN: 3.0000 mL | Freq: Two times a day (BID) | INTRAMUSCULAR | Status: DC

## 2013-07-25 MED ORDER — ONDANSETRON HCL 4 MG PO TABS: 4.0000 mg | ORAL_TABLET | Freq: Four times a day (QID) | ORAL | Status: DC | PRN

## 2013-07-25 MED ORDER — BENZONATATE 100 MG PO CAPS
200.0000 mg | ORAL_CAPSULE | Freq: Three times a day (TID) | ORAL | Status: DC | PRN
  Administered 2013-07-25: 200 mg via ORAL
  Filled 2013-07-25 (×2): qty 2

## 2013-07-25 MED ORDER — GUAIFENESIN-DM 100-10 MG/5ML PO SYRP
5.0000 mL | ORAL_SOLUTION | ORAL | Status: DC | PRN
  Administered 2013-07-25: 5 mL via ORAL
  Filled 2013-07-25 (×2): qty 5

## 2013-07-25 MED ORDER — METOPROLOL SUCCINATE ER 100 MG PO TB24
100.0000 mg | ORAL_TABLET | Freq: Every day | ORAL | Status: DC
  Administered 2013-07-25 – 2013-07-26 (×2): 100 mg via ORAL
  Filled 2013-07-25 (×2): qty 1

## 2013-07-25 MED ORDER — SODIUM CHLORIDE 0.9 % IV SOLN
INTRAVENOUS | Status: AC
  Administered 2013-07-25 – 2013-07-26 (×2): via INTRAVENOUS

## 2013-07-25 MED ORDER — IPRATROPIUM-ALBUTEROL 0.5-2.5 (3) MG/3ML IN SOLN
3.0000 mL | Freq: Once | RESPIRATORY_TRACT | Status: AC
  Administered 2013-07-25: 3 mL via RESPIRATORY_TRACT
  Filled 2013-07-25: qty 3

## 2013-07-25 MED ORDER — ACETAMINOPHEN 325 MG PO TABS: 650.0000 mg | ORAL_TABLET | Freq: Four times a day (QID) | ORAL | Status: DC | PRN

## 2013-07-25 MED ORDER — CLONAZEPAM 1 MG PO TABS
1.0000 mg | ORAL_TABLET | Freq: Every day | ORAL | Status: DC
  Administered 2013-07-25: 1 mg via ORAL
  Filled 2013-07-25: qty 1

## 2013-07-25 MED ORDER — IPRATROPIUM-ALBUTEROL 0.5-2.5 (3) MG/3ML IN SOLN
3.0000 mL | RESPIRATORY_TRACT | Status: DC
  Administered 2013-07-25 – 2013-07-26 (×4): 3 mL via RESPIRATORY_TRACT
  Filled 2013-07-25 (×4): qty 3

## 2013-07-25 MED ORDER — ASPIRIN EC 81 MG PO TBEC
81.0000 mg | DELAYED_RELEASE_TABLET | Freq: Every day | ORAL | Status: DC
  Administered 2013-07-25 – 2013-07-26 (×2): 81 mg via ORAL
  Filled 2013-07-25 (×2): qty 1

## 2013-07-25 MED ORDER — SODIUM CHLORIDE 0.9 % IV BOLUS (SEPSIS)
1000.0000 mL | Freq: Once | INTRAVENOUS | Status: AC
  Administered 2013-07-25: 1000 mL via INTRAVENOUS

## 2013-07-25 MED ORDER — ASPIRIN 81 MG PO TABS: 81.0000 mg | ORAL_TABLET | Freq: Every day | ORAL | Status: DC

## 2013-07-25 MED ORDER — CLOPIDOGREL BISULFATE 75 MG PO TABS
75.0000 mg | ORAL_TABLET | Freq: Every day | ORAL | Status: DC
  Administered 2013-07-25 – 2013-07-26 (×2): 75 mg via ORAL
  Filled 2013-07-25 (×2): qty 1

## 2013-07-25 MED ORDER — BUPROPION HCL ER (SR) 100 MG PO TB12
200.0000 mg | ORAL_TABLET | Freq: Two times a day (BID) | ORAL | Status: DC
  Administered 2013-07-25 – 2013-07-26 (×2): 200 mg via ORAL
  Filled 2013-07-25 (×3): qty 2

## 2013-07-25 NOTE — ED Notes (Signed)
Cough congestion cp fever chills  X 4 days

## 2013-07-25 NOTE — Progress Notes (Signed)
Patient admitted to [id number].  Patient armband verified with name and birth date.  Patient oriented to unit room, use of call bell and telephone.  Patient educated on fall precautions, placed on bed alarm with red socks.  Patient safety Fall Prevention Plan reviewed with patient, patient verbalized understanding and signed.  Patient placed on telemetry box 6E06, CCMT notified.  Vitals obtained and patient assessed.  Will continue to monitor.

## 2013-07-25 NOTE — ED Notes (Signed)
Pt states that with the cough and pain is making it harder to breathe. Lungs have rhonchi in the bases. Pulse ox is WNL. Cough is productive.

## 2013-07-25 NOTE — ED Notes (Signed)
MD spoke with patient about staying.

## 2013-07-25 NOTE — Consult Note (Deleted)
Medical Consultation   [name] [name]  ZOX:[id number]  DOB: September 06, 1945  DOA: 07/25/2013  PCP: Louanne Skye, MD  Requesting physician: Dr. Glynn Octave  Reason for consultation: Possible admission  History of Present Illness: This is a 68 year old gentleman with a history of coronary disease, stroke, hypertension that presents emergency department with complaints of fever and myalgias for approximately 4 days. Patient states he is taking Tylenol as well as Motrin over-the-counter which offered him no help. Patient states his fever as well as pain was improving yesterday however this morning he felt that he was feeling worse. He then came to emergency department.  Patient has not been able to see his primary care physician as they are in [place], [place] and part of the [place] system. He states he has been coughing however no sputum production. He denies any sick contacts. Patient does state he received his influenza vaccination this year.  Allergies:   Allergies  Allergen Reactions  . Clindamycin/Lincomycin Other (See Comments)    Blisters in mouth  . Dilaudid [Hydromorphone Hcl] Other (See Comments)    "CRASHES" per patient Talked to nurse and she said pt is afraid to take dilaudid because pt states he flat-lined when they gave it to him.Marland KitchenMarland KitchenPt doesn't mind taking Percocet       Past Medical History  Diagnosis Date  . Coronary artery disease   . Pneumonia   . Anxiety   . Depression   . Hypertension   . Hypercholesteremia   . Myocardial infarction 2010ish  . PTSD (post-traumatic stress disorder)   . Sleep apnea      doesnt use CPAP  . Stroke     slight weakness left- 85- 90 % returned  . Carpal tunnel syndrome   . Hepatitis     Past Surgical History  Procedure Laterality Date  . Cardiac catheterization    . Coronary angioplasty    . Stents  2012  . Eye surgery Bilateral     cataract  . Knee arthroscopy Right   . Carpal tunnel release Right 06/10/2013    Procedure:  RIGHT CARPAL TUNNEL RELEASE;  Surgeon: Kathryne Hitch, MD;  Location: [facility] OR;  Service: Orthopedics;  Laterality: Right;    Social History:  reports that he quit smoking about 25 years ago. His smoking use included Cigarettes. He has a 40 pack-year smoking history. He has never used smokeless tobacco. He reports that he does not drink alcohol or use illicit drugs.  Family History  Problem Relation Age of Onset  . Heart disease Mother     Several other maternal family members    Review of Systems:  Review of Systems:  Constitutional: Complains of fever and fatigue for approximately 4 days. HEENT: Complains of congestion and rhinorrhea.   Respiratory: Complains of cough with no sputum production and some shortness of breath. Cardiovascular: Denies chest pain, palpitations and leg swelling.  Gastrointestinal: Denies nausea, vomiting, abdominal pain, diarrhea, constipation, blood in stool and abdominal distention.  Genitourinary: Denies dysuria, urgency, frequency, hematuria, flank pain and difficulty urinating.  Musculoskeletal: Complains of myalgias and arthralgias. Skin: Denies pallor, rash and wound.  Neurological:  feels generally weak. Hematological: Denies adenopathy. Easy bruising, personal or family bleeding history  Psychiatric/Behavioral: Denies suicidal ideation, mood changes, confusion, nervousness, sleep disturbance and agitation   Physical Exam: Blood pressure 120/77, pulse 85, temperature 98.5 F (36.9 C), temperature source Oral, resp. rate 12, height 5\' 10"  (1.778 m), weight 99.791 kg (220 lb), SpO2 93.00%.   General:  Well developed, well nourished, NAD, appears stated age  HEENT: NCAT, PERRLA, EOMI, Anicteic Sclera, mucous membranes moist. No pharyngeal erythema or exudates  Neck: Supple, no JVD, no masses  Cardiovascular: S1 S2 auscultated, no rubs, murmurs or gallops. Regular rate and rhythm.  Respiratory: Essentially clear however scattered rhonchi  with deep exhalation.  Abdomen: Soft, nontender, nondistended, + bowel sounds  Extremities: warm dry without cyanosis clubbing or edema  Neuro: AAOx3, cranial nerves grossly intact. Strength 5/5 in patient's upper and lower extremities bilaterally  Skin: Without rashes exudates or nodules  Psych: Normal affect and demeanor with intact judgement and insight  Labs on Admission:  Basic Metabolic Panel:  Recent Labs Lab 07/25/13 0840  NA 142  K 4.6  CL 102  CO2 26  GLUCOSE 124*  BUN 14  CREATININE 0.79  CALCIUM 8.9   Liver Function Tests:  Recent Labs Lab 07/25/13 0840  AST 25  ALT 29  ALKPHOS 81  BILITOT 0.6  PROT 7.5  ALBUMIN 3.6   No results found for this basename: LIPASE, AMYLASE,  in the last 168 hours No results found for this basename: AMMONIA,  in the last 168 hours CBC:  Recent Labs Lab 07/25/13 0840  WBC 9.1  NEUTROABS 6.9  HGB 14.0  HCT 38.5*  MCV 84.6  PLT 143*   Cardiac Enzymes:  Recent Labs Lab 07/25/13 0840  TROPONINI <0.30   BNP: No components found with this basename: POCBNP,  CBG: No results found for this basename: GLUCAP,  in the last 168 hours  Inpatient Medications:   Scheduled Meds: Continuous Infusions:    Radiological Exams on Admission: No results found.  Impression/Recommendations Possible upper respiratory infection/Influenza Patient began having symptoms consistent with the flu, however they began 4 days ago therefore out of the window for Tamiflu.  CXR pending.  Patient is febrile, however, has no leukocytosis.  Patient has had a dry cough with known active sputum production. His influenza PCR was negative. Recommended admission to the patient however he opted to go home. Patient should stay well hydrated as well as her followup with primary care physician. Instructed if his symptoms worsen he should return back to the emergency department. Patient may benefit from albuterol inhaler and antitussive agent.   Time  Spent: 30 minutes  Arther Heisler D.O. [facility] 07/25/2013, 12:41 PM

## 2013-07-25 NOTE — ED Notes (Signed)
Pt ambulated around room and stated with the increased activity pt coughed and had increased chest pain.

## 2013-07-25 NOTE — ED Notes (Signed)
Pt complaining of sinus and chest pain. Pt states it has not improved with time in ED

## 2013-07-25 NOTE — ED Notes (Signed)
Report called to 6E RN

## 2013-07-25 NOTE — H&P (Signed)
[facility] History and Physical  [name] KusterRaymond L Sia ZOX:[id number] DOB: [birth year] DOA: 07/25/2013  Referring physician:  PCP: Louanne SkyeMIRANDA, HARVEY, MD  Specialists:   Chief Complaint: Fever and cough  HPI: [name] [name] is a 68 y.o. male  This is a 68 year old gentleman with a history of coronary disease, stroke, hypertension that presents emergency department with complaints of fever and myalgias for approximately 4 days. Patient states he is taking Tylenol as well as Motrin over-the-counter which offered him no help. Patient states his fever as well as pain was improving yesterday however this morning he felt that he was feeling worse. He then came to emergency department. Patient has not been able to see his primary care [place]. He states he has been coughing however no sputum production. He denies any sick contacts. Patient does state he received his influenza vaccination this year.   Review of Systems:  Constitutional: Complains of fever and fatigue for approximately 4 days.  HEENT: Complains of congestion and rhinorrhea.  Respiratory: Complains of cough with no sputum production and some shortness of breath.  Cardiovascular: Denies chest pain, palpitations and leg swelling.  Gastrointestinal: Denies nausea, vomiting, abdominal pain, diarrhea, constipation, blood in stool and abdominal distention.  Genitourinary: Denies dysuria, urgency, frequency, hematuria, flank pain and difficulty urinating.  Musculoskeletal: Complains of myalgias and arthralgias.  Skin: Denies pallor, rash and wound.  Neurological: feels generally weak.  Hematological: Denies adenopathy. Easy bruising, personal or family bleeding history  Psychiatric/Behavioral: Denies suicidal ideation, mood changes, confusion, nervousness, sleep disturbance and agitation   Past Medical History  Diagnosis Date  . Coronary artery disease   . Pneumonia   . Anxiety   .  Depression   . Hypertension   . Hypercholesteremia   . Myocardial infarction 2010ish  . PTSD (post-traumatic stress disorder)   . Sleep apnea      doesnt use CPAP  . Stroke     slight weakness left- 85- 90 % returned  . Carpal tunnel syndrome   . Hepatitis    Past Surgical History  Procedure Laterality Date  . Cardiac catheterization    . Coronary angioplasty    . Stents  2012  . Eye surgery Bilateral     cataract  . Knee arthroscopy Right   . Carpal tunnel release Right 06/10/2013    Procedure: RIGHT CARPAL TUNNEL RELEASE;  Surgeon: Kathryne Hitchhristopher Y Blackman, MD;  [facility];  Service: Orthopedics;  Laterality: Right;   Social History:  reports that he quit smoking about 25 years ago. His smoking use included Cigarettes. He has a 40 pack-year smoking history. He has never used smokeless tobacco. He reports that he does not drink alcohol or use illicit drugs. Lives at home with his wife.  Allergies  Allergen Reactions  . Clindamycin/Lincomycin Other (See Comments)    Blisters in mouth  . Dilaudid [Hydromorphone Hcl] Other (See Comments)    "CRASHES" per patient Talked to nurse and she said pt is afraid to take dilaudid because pt states he flat-lined when they gave it to him.Marland Kitchen.Marland Kitchen.Pt doesn't mind taking Percocet     Family History  Problem Relation Age of Onset  . Heart disease Mother     Several other maternal family members    Prior to Admission medications   Medication Sig Start Date End Date Taking? Authorizing Provider  aspirin 81 MG tablet Take 81 mg by mouth daily.   Yes Historical Provider,  MD  buPROPion (WELLBUTRIN SR) 100 MG 12 hr tablet Take 200 mg by mouth 2 (two) times daily.   Yes Historical Provider, MD  clonazePAM (KLONOPIN) 1 MG tablet Take 1 mg by mouth at bedtime.    Yes Historical Provider, MD  clopidogrel (PLAVIX) 75 MG tablet Take 75 mg by mouth daily.     Yes Historical Provider, MD  guaiFENesin (MUCINEX) 600 MG 12 hr tablet Take 600 mg by mouth 2  (two) times daily.   Yes Historical Provider, MD  guaiFENesin (ROBITUSSIN) 100 MG/5ML liquid Take 200 mg by mouth 3 (three) times daily as needed for cough.   Yes Historical Provider, MD  HYDROcodone-acetaminophen (NORCO) 5-325 MG per tablet Take 1-2 tablets by mouth every 4 (four) hours as needed for moderate pain. 06/10/13  Yes Kathryne Hitch, MD  loratadine-pseudoephedrine (CLARITIN-D 12-HOUR) 5-120 MG per tablet Take 1 tablet by mouth 2 (two) times daily.   Yes Historical Provider, MD  metoprolol succinate (TOPROL-XL) 100 MG 24 hr tablet Take 100 mg by mouth daily. Take with or immediately following a meal.   Yes Historical Provider, MD  naproxen sodium (ANAPROX) 220 MG tablet Take 440 mg by mouth 2 (two) times daily with a meal.   Yes Historical Provider, MD  nitroGLYCERIN (NITROSTAT) 0.4 MG SL tablet Place 0.4 mg under the tongue every 5 (five) minutes as needed. For chest pain    Yes Historical Provider, MD  Phenyleph-CPM-DM-Aspirin (ALKA-SELTZER PLUS COLD & COUGH PO) Take 1 tablet by mouth every 12 (twelve) hours as needed (cold).   Yes Historical Provider, MD  pravastatin (PRAVACHOL) 20 MG tablet Take 10 mg by mouth at bedtime.    Yes Historical Provider, MD  sertraline (ZOLOFT) 100 MG tablet Take 100 mg by mouth at bedtime.    Yes Historical Provider, MD  triamcinolone cream (KENALOG) 0.1 % Apply 1 application topically 2 (two) times daily as needed (itching).   Yes Historical Provider, MD   Physical Exam: Filed Vitals:   07/25/13 1237  BP: 120/77  Pulse: 85  Temp:   Resp: 12   General: Well developed, well nourished, NAD, appears stated age  HEENT: NCAT, PERRLA, EOMI, Anicteic Sclera, mucous membranes moist. No pharyngeal erythema or exudates  Neck: Supple, no JVD, no masses  Cardiovascular: S1 S2 auscultated, no rubs, murmurs or gallops. Regular rate and rhythm.  Respiratory: Essentially clear however scattered rhonchi with deep exhalation.  Abdomen: Soft, nontender,  nondistended, + bowel sounds  Extremities: warm dry without cyanosis clubbing or edema  Neuro: AAOx3, cranial nerves grossly intact. Strength 5/5 in patient's upper and lower extremities bilaterally  Skin: Without rashes exudates or nodules  Psych: Normal affect and demeanor with intact judgement and insight  Labs on Admission:  Basic Metabolic Panel:  Recent Labs Lab 07/25/13 0840  NA 142  K 4.6  CL 102  CO2 26  GLUCOSE 124*  BUN 14  CREATININE 0.79  CALCIUM 8.9   Liver Function Tests:  Recent Labs Lab 07/25/13 0840  AST 25  ALT 29  ALKPHOS 81  BILITOT 0.6  PROT 7.5  ALBUMIN 3.6   No results found for this basename: LIPASE, AMYLASE,  in the last 168 hours No results found for this basename: AMMONIA,  in the last 168 hours CBC:  Recent Labs Lab 07/25/13 0840  WBC 9.1  NEUTROABS 6.9  HGB 14.0  HCT 38.5*  MCV 84.6  PLT 143*   Cardiac Enzymes:  Recent Labs Lab 07/25/13 0840  TROPONINI <  0.30    BNP (last 3 results)  Recent Labs  07/25/13 0840  PROBNP 7.7   CBG: No results found for this basename: GLUCAP,  in the last 168 hours  Radiological Exams on Admission: No results found.  EKG: Independently reviewed. Sinus tachycardia, rate 109, incomplete right bundle branch block  Assessment/Plan Active Problems:   Upper respiratory infection   Upper respiratory infection He should be admitted for observation to medical floor. His influenza screen is currently negative. Patient did start having a fever as well as body aches myalgias approximately 4 days ago, he sat at the table window. Respiratory virus panel has been ordered. Chest x-ray does not show any acute cardiopulmonary abnormalities. Patient currently has no leukocytosis and is currently afebrile. Will place patient on IV fluids as well as Flonase for his nasal congestion. Patient likely has an upper respiratory infection, viral etiology. Will continue to monitor patient, as well as give him to  meds, and incentive spirometry. Will given Tessalon Perles for his cough.  Chest pain likely secondary to musculoskeletal cause Patient's chest pain is reproducible with palpation. Patient does only complain of chest pain when he coughs. EKG did not show anything acute. Troponin was negative. Will continue his home medications of focal pericardial as well as aspirin, pravastatin, and.  Mild dehydration Likely secondary to his fever and upper respiratory infection. Will place patient on IV fluids and continue to monitor. Currently patient has no electrolyte abnormalities.  Hyperlipidemia Will continue patient on statin.  Hypertension Currently controlled continue metoprolol.  History of stroke Stable, continue aspirin and Plavix.  Anxiety/depression Continue Wellbutrin and Zoloft.   DVT prophylaxis: lovenox  Code Status: Full  Condition: Stable  Family Communication: Wife at bedside.  Admission, patients condition and plan of care including tests being ordered have been discussed with the patient and wife who indicate understanding and agree with the plan and Code Status.  Disposition Plan: Admitted for observation  Time spent: 60 minutes  Norberta Stobaugh D.O. [facility] Pager [phone]  If 7PM-7AM, please contact night-coverage [email or web address] Password [email or web address] 07/25/2013, 1:05 PM

## 2013-07-25 NOTE — ED Provider Notes (Signed)
CSN: [id number]     Arrival date & time 07/25/13  0803 History   First MD Initiated Contact with Patient 07/25/13 [phone]     Chief Complaint  Patient presents with  . Cough  . Fever  . Chest Pain   (Consider location/radiation/quality/duration/timing/severity/associated sxs/prior Treatment) HPI Comments: Patient presents with a four-day history of productive cough, congestion, fever to 101, chills and shortness of breath with chest tightness with coughing. He did receive a flu shot. He denies any sick contacts. He has some chest tightness on coughing as not the same as his previous MI. Sent for appetite at home. He's been taking Tylenol Motrin and over-the-counter cold remedies. Denies any difficulty swallowing. Denies abdominal pain, nausea or vomiting but has had poor appetite. Denies any leg pain or leg swelling. He has a history of previous MI, CVA and [facility].  The history is provided by the patient and the spouse.    Past Medical History  Diagnosis Date  . Coronary artery disease   . Pneumonia   . Anxiety   . Depression   . Hypertension   . Hypercholesteremia   . Myocardial infarction 2010ish  . PTSD (post-traumatic stress disorder)   . Sleep apnea      doesnt use CPAP  . Stroke     slight weakness left- 85- 90 % returned  . Carpal tunnel syndrome   . Hepatitis    Past Surgical History  Procedure Laterality Date  . Cardiac catheterization    . Coronary angioplasty    . Stents  2012  . Eye surgery Bilateral     cataract  . Knee arthroscopy Right   . Carpal tunnel release Right 06/10/2013    Procedure: RIGHT CARPAL TUNNEL RELEASE;  Surgeon: Kathryne Hitchhristopher Y Blackman, MD;  [facility];  Service: Orthopedics;  Laterality: Right;   Family History  Problem Relation Age of Onset  . Heart disease Mother     Several other maternal family members   History  Substance Use Topics  . Smoking status: Former Smoker -- 2.00 packs/day for 20 years    Types:  Cigarettes    Quit date: 11/21/1987  . Smokeless tobacco: Never Used  . Alcohol Use: No    Review of Systems  Constitutional: Positive for fever, activity change, appetite change and fatigue.  HENT: Positive for congestion and rhinorrhea.   Respiratory: Positive for cough, chest tightness and shortness of breath.   Cardiovascular: Positive for chest pain.  Gastrointestinal: Negative for nausea, vomiting and abdominal pain.  Genitourinary: Negative for dysuria and hematuria.  Musculoskeletal: Positive for arthralgias and myalgias.  Skin: Negative for wound.  Neurological: Positive for weakness. Negative for dizziness, light-headedness and headaches.  A complete 10 system review of systems was obtained and all systems are negative except as noted in the HPI and PMH.    Allergies  Clindamycin/lincomycin and Dilaudid  Home Medications   Current Outpatient Rx  Name  Route  Sig  Dispense  Refill  . aspirin 81 MG tablet   Oral   Take 81 mg by mouth daily.         Marland Kitchen. buPROPion (WELLBUTRIN SR) 100 MG 12 hr tablet   Oral   Take 200 mg by mouth 2 (two) times daily.         . clonazePAM (KLONOPIN) 1 MG tablet   Oral   Take 1 mg by mouth at bedtime.          .Marland Kitchen  clopidogrel (PLAVIX) 75 MG tablet   Oral   Take 75 mg by mouth daily.           Marland Kitchen guaiFENesin (MUCINEX) 600 MG 12 hr tablet   Oral   Take 600 mg by mouth 2 (two) times daily.         Marland Kitchen guaiFENesin (ROBITUSSIN) 100 MG/5ML liquid   Oral   Take 200 mg by mouth 3 (three) times daily as needed for cough.         Marland Kitchen HYDROcodone-acetaminophen (NORCO) 5-325 MG per tablet   Oral   Take 1-2 tablets by mouth every 4 (four) hours as needed for moderate pain.   60 tablet   0   . loratadine-pseudoephedrine (CLARITIN-D 12-HOUR) 5-120 MG per tablet   Oral   Take 1 tablet by mouth 2 (two) times daily.         . metoprolol succinate (TOPROL-XL) 100 MG 24 hr tablet   Oral   Take 100 mg by mouth daily. Take with or  immediately following a meal.         . naproxen sodium (ANAPROX) 220 MG tablet   Oral   Take 440 mg by mouth 2 (two) times daily with a meal.         . nitroGLYCERIN (NITROSTAT) 0.4 MG SL tablet   Sublingual   Place 0.4 mg under the tongue every 5 (five) minutes as needed. For chest pain          . Phenyleph-CPM-DM-Aspirin (ALKA-SELTZER PLUS COLD & COUGH PO)   Oral   Take 1 tablet by mouth every 12 (twelve) hours as needed (cold).         . pravastatin (PRAVACHOL) 20 MG tablet   Oral   Take 10 mg by mouth at bedtime.          . sertraline (ZOLOFT) 100 MG tablet   Oral   Take 100 mg by mouth at bedtime.          . triamcinolone cream (KENALOG) 0.1 %   Topical   Apply 1 application topically 2 (two) times daily as needed (itching).          BP 120/77  Pulse 85  Temp(Src) 98.5 F (36.9 C) (Oral)  Resp 12  Ht 5\' 10"  (1.778 m)  Wt 220 lb (99.791 kg)  BMI 31.57 kg/m2  SpO2 93% Physical Exam  Constitutional: He is oriented to person, place, and time. He appears well-developed and well-nourished. No distress.  HENT:  Head: Normocephalic and atraumatic.  Mouth/Throat: Oropharynx is clear and moist. No oropharyngeal exudate.  Eyes: Conjunctivae and EOM are normal. Pupils are equal, round, and reactive to light.  Neck: Normal range of motion. Neck supple.  Cardiovascular: Normal rate, regular rhythm and normal heart sounds.   No murmur heard. Pulmonary/Chest: No respiratory distress. He has rales.  Coarse rhonchi throughout. Moderate air exchange  Abdominal: Soft. There is no tenderness. There is no rebound and no guarding.  Musculoskeletal: Normal range of motion. He exhibits no edema and no tenderness.  Neurological: He is alert and oriented to person, place, and time. No cranial nerve deficit. He exhibits normal muscle tone. Coordination normal.  Skin: Skin is warm.    ED Course  Procedures (including critical care time) Labs Review Labs Reviewed  CBC  WITH DIFFERENTIAL - Abnormal; Notable for the following:    HCT 38.5 (*)    MCHC 36.4 (*)    Platelets 143 (*)    All  other components within normal limits  COMPREHENSIVE METABOLIC PANEL - Abnormal; Notable for the following:    Glucose, Bld 124 (*)    GFR calc non Af Amer 90 (*)    All other components within normal limits  TROPONIN I  PRO B NATRIURETIC PEPTIDE  URINALYSIS, ROUTINE W REFLEX MICROSCOPIC  INFLUENZA PANEL BY PCR (TYPE A & B, H1N1)  TROPONIN I   Imaging Review No results found.  EKG Interpretation    Date/Time:  Friday July 25 2013 08:42:53 EST Ventricular Rate:  109 PR Interval:  163 QRS Duration: 118 QT Interval:  319 QTC Calculation: 429 R Axis:   86 Text Interpretation:  Sinus tachycardia Incomplete right bundle branch block No significant change was found Confirmed by Manus Gunning  MD, Nakaiya Beddow ([id number]) on 07/25/2013 9:56:26 AM            MDM   1. Upper respiratory infection   2. Influenza-like illness    Cough, congestion, chills, fever, x 4 days.  Some chest tightness with coughing.    No infiltrate on chest x-ray by verbal report. Normal white blood cell count. Labs otherwise normal.  Patient with elevated heart rate in the 130s on ambulation. Sats maintained in the mid 90s. He becomes increasing short of breath and coughs with ambulation.  Suspect influenza-like illness. Patient been sick for 4 days and is out of the Tamiflu window. Patient tolerating by mouth. He is able to read. He denies any shortness of breath. His heart rate has improved.  He has been evaluated by Dr. Catha Gosselin of the hospitalist service. She feels he likely has influenza as well there is nothing to offer him in the hospital except observation and IV fluids.. Admission was offered but patient decided rather go home.  Patient and wife have changed their minds and patient relate to stay overnight for IV fluids. He feels that he is still short of breath having lots of coughing  congestion feeling weak. He was advised antibiotics and Tamiflu are not indicated. Dr. Catha Gosselin is willing to observe him for observation.  BP 120/77  Pulse 85  Temp(Src) 98.5 F (36.9 C) (Oral)  Resp 12  Ht 5\' 10"  (1.778 m)  Wt 220 lb (99.791 kg)  BMI 31.57 kg/m2  SpO2 93%   Glynn Octave, MD 07/25/13 1257

## 2013-07-26 DIAGNOSIS — J111 Influenza due to unidentified influenza virus with other respiratory manifestations: Secondary | ICD-10-CM

## 2013-07-26 LAB — BASIC METABOLIC PANEL
BUN: 13 mg/dL (ref 6–23)
CALCIUM: 8.4 mg/dL (ref 8.4–10.5)
CO2: 23 mEq/L (ref 19–32)
CREATININE: 0.74 mg/dL (ref 0.50–1.35)
Chloride: 104 mEq/L (ref 96–112)
GFR calc Af Amer: >90 mL/min (ref >90)
GFR calc non Af Amer: >90 mL/min (ref >90)
Glucose, Bld: 110 mg/dL — ABNORMAL HIGH (ref 70–99)
Potassium: 4 mEq/L (ref 3.7–5.3)
Sodium: 141 mEq/L (ref 137–147)

## 2013-07-26 LAB — CBC
HEMATOCRIT: 33.8 % — AB (ref 39.0–52.0)
Hemoglobin: 12 g/dL — ABNORMAL LOW (ref 13.0–17.0)
MCH: 30.3 pg (ref 26.0–34.0)
MCHC: 35.5 g/dL (ref 30.0–36.0)
MCV: 85.4 fL (ref 78.0–100.0)
PLATELETS: 133 10*3/uL — AB (ref 150–400)
RBC: 3.96 MIL/uL — ABNORMAL LOW (ref 4.22–5.81)
RDW: 11.9 % (ref 11.5–15.5)
WBC: 7.5 10*3/uL (ref 4.0–10.5)

## 2013-07-26 MED ORDER — ACETAMINOPHEN 325 MG PO TABS: 650.0000 mg | ORAL_TABLET | Freq: Four times a day (QID) | ORAL | Status: DC | PRN

## 2013-07-26 MED ORDER — IPRATROPIUM-ALBUTEROL 0.5-2.5 (3) MG/3ML IN SOLN: 3.0000 mL | RESPIRATORY_TRACT | Status: DC

## 2013-07-26 MED ORDER — ALBUTEROL SULFATE HFA 108 (90 BASE) MCG/ACT IN AERS: 2.0000 | INHALATION_SPRAY | Freq: Four times a day (QID) | RESPIRATORY_TRACT | Status: DC | PRN

## 2013-07-26 MED ORDER — LEVOFLOXACIN 500 MG PO TABS: 500.0000 mg | ORAL_TABLET | Freq: Every day | ORAL | Status: DC

## 2013-07-26 NOTE — Progress Notes (Signed)
Patient discharge teaching given, including activity, diet, follow-up appoints, and medications. Patient verbalized understanding of all discharge instructions. IV access was d/c'd. Vitals are stable. Skin is intact except as charted in most recent assessments. Pt to be escorted out by NT, to be driven home by family.  Melonee Gerstel, MBA, BS, RN 

## 2013-07-26 NOTE — Discharge Summary (Signed)
Physician Discharge Summary  Frederica KusterRaymond L Streng NGE:[id number]:[id number] DOB: [birth year] DOA: 07/25/2013  PCP: Louanne SkyeMIRANDA, HARVEY, MD  Admit date: 07/25/2013 Discharge date: 07/26/2013  Time spent: >35 minutes  Recommendations for Outpatient Follow-up:  F/u with PCP next week  Discharge Diagnoses:  Principal Problem:   Upper respiratory infection Active Problems:   Musculoskeletal chest pain   Dehydration   Discharge Condition: stable   Diet recommendation: heart healthy  Filed Weights   07/25/13 0806  Weight: 99.791 kg (220 lb)    History of present illness:  68 year old gentleman with a history of coronary disease, stroke, hypertension that presents emergency department with complaints of fever and myalgias for approximately 4 days. Patient states he is taking Tylenol as well as Motrin over-the-counter which offered him no help. Patient states his fever as well as pain was improving yesterday however this morning he felt that he was feeling worse. He then came to emergency department   Hospital Course:  Upper respiratory infection  Chest x-ray does not show any acute cardiopulmonary abnormalities. Neg influenza;  Patient currently has no leukocytosis and is currently afebrile -symptoms improved, lung exam is unremarkable; Need OP PFT r/o underlying COPD; will provide atx, and bronchodilators prn  Chest pain likely secondary to musculoskeletal cause  Patient's chest pain is reproducible with palpation. Patient does only complain of chest pain when he coughs. -continue his home medications    Procedures:  CXR (i.e. Studies not automatically included, echos, thoracentesis, etc; not x-rays)  Consultations:  None   Discharge Exam: Filed Vitals:   07/26/13 0451  BP: 109/62  Pulse: 74  Temp: 97.5 F (36.4 C)  Resp: 16    General: alert Cardiovascular: s1,s2 rrr Respiratory: few wheezing in LL  Discharge Instructions  Discharge Orders   Future Orders Complete By Expires   Diet  - low sodium heart healthy  As directed    Discharge instructions  As directed    Comments:     Please follow up with primary care doctor in 1 week   Increase activity slowly  As directed        Medication List    STOP taking these medications       naproxen sodium 220 MG tablet  Commonly known as:  ANAPROX      TAKE these medications       acetaminophen 325 MG tablet  Commonly known as:  TYLENOL  Take 2 tablets (650 mg total) by mouth every 6 (six) hours as needed for mild pain (or Fever >/= 101).     albuterol 108 (90 BASE) MCG/ACT inhaler  Commonly known as:  PROVENTIL HFA;VENTOLIN HFA  Inhale 2 puffs into the lungs every 6 (six) hours as needed for wheezing or shortness of breath.     ALKA-SELTZER PLUS COLD & COUGH PO  Take 1 tablet by mouth every 12 (twelve) hours as needed (cold).     aspirin 81 MG tablet  Take 81 mg by mouth daily.     buPROPion 100 MG 12 hr tablet  Commonly known as:  WELLBUTRIN SR  Take 200 mg by mouth 2 (two) times daily.     clonazePAM 1 MG tablet  Commonly known as:  KLONOPIN  Take 1 mg by mouth at bedtime.     clopidogrel 75 MG tablet  Commonly known as:  PLAVIX  Take 75 mg by mouth daily.     guaiFENesin 600 MG 12 hr tablet  Commonly known as:  MUCINEX  Take 600 mg by  mouth 2 (two) times daily.     guaiFENesin 100 MG/5ML liquid  Commonly known as:  ROBITUSSIN  Take 200 mg by mouth 3 (three) times daily as needed for cough.     HYDROcodone-acetaminophen 5-325 MG per tablet  Commonly known as:  NORCO  Take 1-2 tablets by mouth every 4 (four) hours as needed for moderate pain.     ipratropium-albuterol 0.5-2.5 (3) MG/3ML Soln  Commonly known as:  DUONEB  Take 3 mLs by nebulization every 4 (four) hours.     levofloxacin 500 MG tablet  Commonly known as:  LEVAQUIN  Take 1 tablet (500 mg total) by mouth daily.     loratadine-pseudoephedrine 5-120 MG per tablet  Commonly known as:  CLARITIN-D 12-hour  Take 1 tablet by mouth 2  (two) times daily.     metoprolol succinate 100 MG 24 hr tablet  Commonly known as:  TOPROL-XL  Take 100 mg by mouth daily. Take with or immediately following a meal.     nitroGLYCERIN 0.4 MG SL tablet  Commonly known as:  NITROSTAT  Place 0.4 mg under the tongue every 5 (five) minutes as needed. For chest pain     pravastatin 20 MG tablet  Commonly known as:  PRAVACHOL  Take 10 mg by mouth at bedtime.     sertraline 100 MG tablet  Commonly known as:  ZOLOFT  Take 100 mg by mouth at bedtime.     triamcinolone cream 0.1 %  Commonly known as:  KENALOG  Apply 1 application topically 2 (two) times daily as needed (itching).       Allergies  Allergen Reactions  . Clindamycin/Lincomycin Other (See Comments)    Blisters in mouth  . Dilaudid [Hydromorphone Hcl] Other (See Comments)    "CRASHES" per patient Talked to nurse and she said pt is afraid to take dilaudid because pt states he flat-lined when they gave it to him.Marland KitchenMarland KitchenPt doesn't mind taking Percocet        Follow-up Information   Follow up with MIRANDA, HARVEY, MD In 1 week.   Specialty:  Family Medicine   Contact information:   [address] [place] [address] [phone]        The results of significant diagnostics from this hospitalization (including imaging, microbiology, ancillary and laboratory) are listed below for reference.    Significant Diagnostic Studies: Dg Chest 2 View  07/25/2013   CLINICAL DATA:  68 year old male with cough and fever.  EXAM: TWO-VIEW CHEST  TECHNIQUE: PA and lateral chest x-ray.  CONTRAST:  None  COMPARISON:  06/10/2013 and earlier.  RADIOPHARMACEUTICALS:  None  FLUOROSCOPY TIME:  Nine  FINDINGS: Lung volumes are within normal limits. Cardiac and mediastinal contours are within normal limits. Negative trachea. No pneumothorax, pulmonary edema, pleural effusion or confluent pulmonary opacity. No acute osseous abnormality.  IMPRESSION: No acute cardiopulmonary abnormality.    Electronically Signed   By: Augusto Gamble M.D.   On: 07/25/2013 09:41    Microbiology: No results found for this or any previous visit (from the past 240 hour(s)).   Labs: Basic Metabolic Panel:  Recent Labs Lab 07/25/13 0840 07/26/13 0346  NA 142 141  K 4.6 4.0  CL 102 104  CO2 26 23  GLUCOSE 124* 110*  BUN 14 13  CREATININE 0.79 0.74  CALCIUM 8.9 8.4   Liver Function Tests:  Recent Labs Lab 07/25/13 0840  AST 25  ALT 29  ALKPHOS 81  BILITOT 0.6  PROT 7.5  ALBUMIN 3.6  No results found for this basename: LIPASE, AMYLASE,  in the last 168 hours No results found for this basename: AMMONIA,  in the last 168 hours CBC:  Recent Labs Lab 07/25/13 0840 07/26/13 0346  WBC 9.1 7.5  NEUTROABS 6.9  --   HGB 14.0 12.0*  HCT 38.5* 33.8*  MCV 84.6 85.4  PLT 143* 133*   Cardiac Enzymes:  Recent Labs Lab 07/25/13 0840 07/25/13 1230  TROPONINI <0.30 <0.30   BNP: BNP (last 3 results)  Recent Labs  07/25/13 0840  PROBNP 7.7   CBG: No results found for this basename: GLUCAP,  in the last 168 hours     Signed:  Esperanza Sheets  [facility] 07/26/2013, 9:19 AM

## 2013-07-27 LAB — RESPIRATORY VIRUS PANEL
Adenovirus: NOT DETECTED
INFLUENZA A H1: NOT DETECTED
INFLUENZA A H3: NOT DETECTED
INFLUENZA A: NOT DETECTED
INFLUENZA B 1: NOT DETECTED
Metapneumovirus: NOT DETECTED
PARAINFLUENZA 2 A: NOT DETECTED
Parainfluenza 1: NOT DETECTED
Parainfluenza 3: NOT DETECTED
RESPIRATORY SYNCYTIAL VIRUS B: NOT DETECTED
Respiratory Syncytial Virus A: DETECTED — AB
Rhinovirus: NOT DETECTED

## 2013-08-12 ENCOUNTER — Encounter: Payer: Self-pay | Admitting: Podiatry

## 2013-08-29 ENCOUNTER — Telehealth: Payer: Self-pay | Admitting: *Deleted

## 2013-08-29 NOTE — Telephone Encounter (Signed)
Pt request fungal culture results.  I informed pt it could take 4 - 6 weeks or possibly a little longer for results.

## 2013-09-22 DIAGNOSIS — E785 Hyperlipidemia, unspecified: Secondary | ICD-10-CM | POA: Insufficient documentation

## 2013-09-22 DIAGNOSIS — I10 Essential (primary) hypertension: Secondary | ICD-10-CM | POA: Insufficient documentation

## 2013-09-22 DIAGNOSIS — I2510 Atherosclerotic heart disease of native coronary artery without angina pectoris: Secondary | ICD-10-CM | POA: Insufficient documentation

## 2013-10-26 IMAGING — DX DG CHEST 1V PORT
1 series · 1 of 1 positions shown · non-contrast
Comparison: 06/06/2011.

CLINICAL DATA: Chest pain.

PORTABLE CHEST - 1 VIEW

[AP]
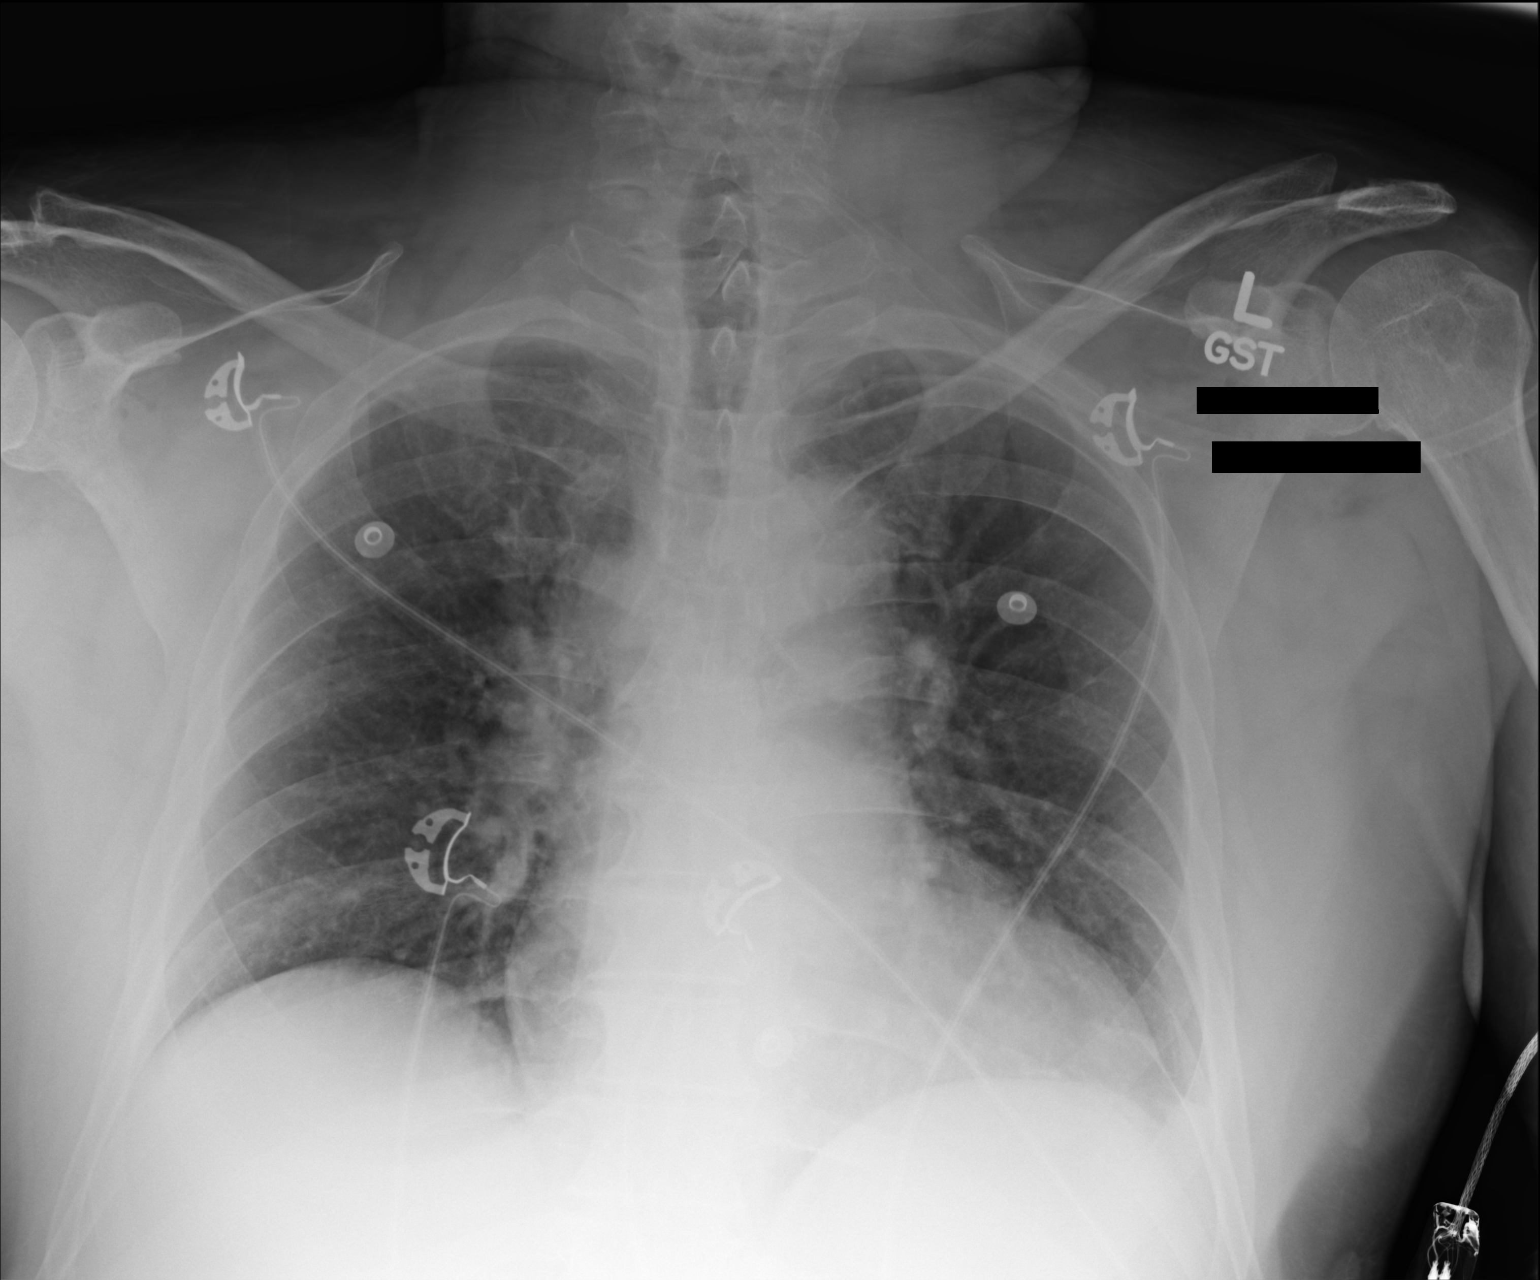

[1 of 1 positions shown; findings below may reference images not displayed]

FINDINGS: Cardiomegaly.  Calcified tortuous aorta.  Mild vascular
congestion. Mild subsegmental atelectasis.  Slight left pleural
effusion.  No overt infiltrate or failure.
IMPRESSION: Cardiomegaly without infiltrates or pulmonary edema. Slight left
pleural effusion.  Mild subsegmental atelectasis.

## 2013-10-29 ENCOUNTER — Telehealth: Payer: Self-pay | Admitting: *Deleted

## 2013-10-29 NOTE — Telephone Encounter (Signed)
I called and left the patient a message that we apologize for not contacting him in regards to the fungal culture results.  We received the results on 07/31/2013.  I asked him to call and schedule an appointment with Dr. Al CorpusHyatt for results and course of treatment.

## 2013-12-09 ENCOUNTER — Ambulatory Visit: Payer: Self-pay | Admitting: Podiatry

## 2013-12-12 ENCOUNTER — Emergency Department (HOSPITAL_COMMUNITY)
Admission: EM | Admit: 2013-12-12 | Discharge: 2013-12-13 | Disposition: A | Discharge status: Home / Self Care | Payer: Non-veteran care | Attending: Emergency Medicine | Admitting: Emergency Medicine

## 2013-12-12 ENCOUNTER — Encounter (HOSPITAL_COMMUNITY): Payer: Self-pay | Admitting: Emergency Medicine

## 2013-12-12 DIAGNOSIS — W19XXXA Unspecified fall, initial encounter: Secondary | ICD-10-CM

## 2013-12-12 DIAGNOSIS — Y9389 Activity, other specified: Secondary | ICD-10-CM | POA: Diagnosis not present

## 2013-12-12 DIAGNOSIS — S20219A Contusion of unspecified front wall of thorax, initial encounter: Secondary | ICD-10-CM | POA: Insufficient documentation

## 2013-12-12 DIAGNOSIS — S20212A Contusion of left front wall of thorax, initial encounter: Secondary | ICD-10-CM

## 2013-12-12 DIAGNOSIS — I10 Essential (primary) hypertension: Secondary | ICD-10-CM | POA: Insufficient documentation

## 2013-12-12 DIAGNOSIS — Z87891 Personal history of nicotine dependence: Secondary | ICD-10-CM | POA: Diagnosis not present

## 2013-12-12 DIAGNOSIS — Z7982 Long term (current) use of aspirin: Secondary | ICD-10-CM | POA: Insufficient documentation

## 2013-12-12 DIAGNOSIS — Y92009 Unspecified place in unspecified non-institutional (private) residence as the place of occurrence of the external cause: Secondary | ICD-10-CM | POA: Insufficient documentation

## 2013-12-12 DIAGNOSIS — Z8719 Personal history of other diseases of the digestive system: Secondary | ICD-10-CM | POA: Insufficient documentation

## 2013-12-12 DIAGNOSIS — S63257A Unspecified dislocation of left little finger, initial encounter: Secondary | ICD-10-CM

## 2013-12-12 DIAGNOSIS — S63279A Dislocation of unspecified interphalangeal joint of unspecified finger, initial encounter: Secondary | ICD-10-CM | POA: Insufficient documentation

## 2013-12-12 DIAGNOSIS — S301XXA Contusion of abdominal wall, initial encounter: Secondary | ICD-10-CM | POA: Diagnosis not present

## 2013-12-12 DIAGNOSIS — Z9861 Coronary angioplasty status: Secondary | ICD-10-CM | POA: Diagnosis not present

## 2013-12-12 DIAGNOSIS — Z8701 Personal history of pneumonia (recurrent): Secondary | ICD-10-CM | POA: Diagnosis not present

## 2013-12-12 DIAGNOSIS — Z8669 Personal history of other diseases of the nervous system and sense organs: Secondary | ICD-10-CM | POA: Insufficient documentation

## 2013-12-12 DIAGNOSIS — S8392XA Sprain of unspecified site of left knee, initial encounter: Secondary | ICD-10-CM

## 2013-12-12 DIAGNOSIS — I2510 Atherosclerotic heart disease of native coronary artery without angina pectoris: Secondary | ICD-10-CM | POA: Diagnosis not present

## 2013-12-12 DIAGNOSIS — Z79899 Other long term (current) drug therapy: Secondary | ICD-10-CM | POA: Insufficient documentation

## 2013-12-12 DIAGNOSIS — I252 Old myocardial infarction: Secondary | ICD-10-CM | POA: Diagnosis not present

## 2013-12-12 DIAGNOSIS — Z8673 Personal history of transient ischemic attack (TIA), and cerebral infarction without residual deficits: Secondary | ICD-10-CM | POA: Insufficient documentation

## 2013-12-12 DIAGNOSIS — S8990XA Unspecified injury of unspecified lower leg, initial encounter: Secondary | ICD-10-CM | POA: Diagnosis present

## 2013-12-12 DIAGNOSIS — IMO0002 Reserved for concepts with insufficient information to code with codable children: Secondary | ICD-10-CM | POA: Insufficient documentation

## 2013-12-12 DIAGNOSIS — Z9889 Other specified postprocedural states: Secondary | ICD-10-CM | POA: Insufficient documentation

## 2013-12-12 DIAGNOSIS — R296 Repeated falls: Secondary | ICD-10-CM | POA: Insufficient documentation

## 2013-12-12 LAB — CBC WITH DIFFERENTIAL/PLATELET
BASOS PCT: 0 % (ref 0–1)
Basophils Absolute: 0 10*3/uL (ref 0.0–0.1)
EOS ABS: 0.1 10*3/uL (ref 0.0–0.7)
Eosinophils Relative: 1 % (ref 0–5)
HCT: 39.8 % (ref 39.0–52.0)
HEMOGLOBIN: 14.1 g/dL (ref 13.0–17.0)
Lymphocytes Relative: 17 % (ref 12–46)
Lymphs Abs: 2.2 10*3/uL (ref 0.7–4.0)
MCH: 31.1 pg (ref 26.0–34.0)
MCHC: 35.4 g/dL (ref 30.0–36.0)
MCV: 87.7 fL (ref 78.0–100.0)
MONOS PCT: 7 % (ref 3–12)
Monocytes Absolute: 0.8 10*3/uL (ref 0.1–1.0)
NEUTROS ABS: 9.7 10*3/uL — AB (ref 1.7–7.7)
NEUTROS PCT: 75 % (ref 43–77)
PLATELETS: 239 10*3/uL (ref 150–400)
RBC: 4.54 MIL/uL (ref 4.22–5.81)
RDW: 12.2 % (ref 11.5–15.5)
WBC: 12.8 10*3/uL — ABNORMAL HIGH (ref 4.0–10.5)

## 2013-12-12 MED ORDER — FENTANYL CITRATE 0.05 MG/ML IJ SOLN
100.0000 ug | Freq: Once | INTRAMUSCULAR | Status: AC
  Administered 2013-12-12: 100 ug via INTRAVENOUS
  Filled 2013-12-12: qty 2

## 2013-12-12 NOTE — ED Notes (Signed)
Patient fell at home in his garage, now complaining of left sided flank pain and back pain.  Patient also has deformity to left pinky.  Patient denies any LOC, has full recall.  Patient is CAOx3.

## 2013-12-12 NOTE — ED Provider Notes (Signed)
CSN: [id number]     Arrival date & time 12/12/13  2211 History   First MD Initiated Contact with Patient 12/12/13 2301     Chief Complaint  Patient presents with  . Fall     (Consider location/radiation/quality/duration/timing/severity/associated sxs/prior Treatment) HPI 68 year old male presents to the emergency department from home after a fall.  Patient reports that he stumbled and fell over a trial he and his work room.  He fell in his left side.  He is complaining of pain along his left flank and ribs.  He denies LOC.  He has some pain to his left knee and pain with deformity of his left pinky finger.  He denies striking his head.  No neck pain. Past Medical History  Diagnosis Date  . Coronary artery disease   . Pneumonia   . Anxiety   . Depression   . Hypertension   . Hypercholesteremia   . Myocardial infarction 2010ish  . PTSD (post-traumatic stress disorder)   . Sleep apnea      doesnt use CPAP  . Stroke     slight weakness left- 85- 90 % returned  . Carpal tunnel syndrome   . Hepatitis    Past Surgical History  Procedure Laterality Date  . Cardiac catheterization    . Coronary angioplasty    . Stents  2012  . Eye surgery Bilateral     cataract  . Knee arthroscopy Right   . Carpal tunnel release Right 06/10/2013    Procedure: RIGHT CARPAL TUNNEL RELEASE;  Surgeon: Kathryne Hitch, MD;  Location: [facility] OR;  Service: Orthopedics;  Laterality: Right;   Family History  Problem Relation Age of Onset  . Heart disease Mother     Several other maternal family members   History  Substance Use Topics  . Smoking status: Former Smoker -- 2.00 packs/day for 20 years    Types: Cigarettes    Quit date: 11/21/1987  . Smokeless tobacco: Never Used  . Alcohol Use: No    Review of Systems   See History of Present Illness; otherwise all other systems are reviewed and negative  Allergies  Clindamycin/lincomycin and Dilaudid  Home Medications   Prior to Admission  medications   Medication Sig Start Date End Date Taking? Authorizing Rhoda Waldvogel  acetaminophen (TYLENOL) 325 MG tablet Take 2 tablets (650 mg total) by mouth every 6 (six) hours as needed for mild pain (or Fever >/= 101). 07/26/13  Yes Esperanza Sheets, MD  albuterol (PROVENTIL HFA;VENTOLIN HFA) 108 (90 BASE) MCG/ACT inhaler Inhale 2 puffs into the lungs every 6 (six) hours as needed for wheezing or shortness of breath. 07/26/13  Yes Esperanza Sheets, MD  aspirin 81 MG tablet Take 81 mg by mouth daily.   Yes Historical Javaris Wigington, MD  atorvastatin (LIPITOR) 80 MG tablet Take 40 mg by mouth daily.   Yes Historical Vannah Nadal, MD  clonazePAM (KLONOPIN) 1 MG tablet Take 1 mg by mouth at bedtime.    Yes Historical Nolah Krenzer, MD  clopidogrel (PLAVIX) 75 MG tablet Take 75 mg by mouth daily.     Yes Historical Lavon Horn, MD  etodolac (LODINE) 400 MG tablet Take 400 mg by mouth 2 (two) times daily as needed for mild pain.   Yes Historical Treshaun Carrico, MD  gabapentin (NEURONTIN) 300 MG capsule Take 300 mg by mouth 2 (two) times daily.   Yes Historical Jora Galluzzo, MD  ipratropium-albuterol (DUONEB) 0.5-2.5 (3) MG/3ML SOLN Take 3 mLs by nebulization every 4 (four) hours. 07/26/13  Yes Esperanza Sheets, MD  metoprolol succinate (TOPROL-XL) 50 MG 24 hr tablet Take 25 mg by mouth daily. Take with or immediately following a meal.   Yes Historical Dameion Briles, MD  nitroGLYCERIN (NITROSTAT) 0.4 MG SL tablet Place 0.4 mg under the tongue every 5 (five) minutes as needed. For chest pain    Yes Historical Ollie Esty, MD  sertraline (ZOLOFT) 100 MG tablet Take 100 mg by mouth at bedtime.    Yes Historical Ellene Bloodsaw, MD  tiZANidine (ZANAFLEX) 4 MG tablet Take 12 mg by mouth at bedtime.   Yes Historical Akeen Ledyard, MD   BP 146/90  Pulse 72  Temp(Src) 98.2 F (36.8 C) (Oral)  Resp 17  SpO2 99% Physical Exam  Nursing note and vitals reviewed. Constitutional: He is oriented to person, place, and time. He appears well-developed and  well-nourished.  HENT:  Head: Normocephalic and atraumatic.  Right Ear: External ear normal.  Left Ear: External ear normal.  Nose: Nose normal.  Mouth/Throat: Oropharynx is clear and moist.  Eyes: Conjunctivae and EOM are normal. Pupils are equal, round, and reactive to light.  Neck: Normal range of motion. Neck supple. No JVD present. No tracheal deviation present. No thyromegaly present.  Cardiovascular: Normal rate, regular rhythm, normal heart sounds and intact distal pulses.  Exam reveals no gallop and no friction rub.   No murmur heard. Pulmonary/Chest: Effort normal and breath sounds normal. No stridor. No respiratory distress. He has no wheezes. He has no rales. He exhibits tenderness (patient has bruising along left posterior ribs, tender across left anterior midaxillary and posterior ribs on the left side.).  Abdominal: Soft. Bowel sounds are normal. He exhibits no distension and no mass. There is tenderness (patient has tenderness across upper abdomen mainly in the left side). There is no rebound and no guarding.  Musculoskeletal: Normal range of motion. He exhibits tenderness (tenderness to the medial aspect of the left knee.  No crepitus, deformity, effusion noted). He exhibits no edema.  Patient has deformity of left pinky finger with deviation laterally of the distal aspect  Lymphadenopathy:    He has no cervical adenopathy.  Neurological: He is alert and oriented to person, place, and time. He exhibits normal muscle tone. Coordination normal.  Skin: Skin is warm and dry. No rash noted. No erythema. No pallor.  Psychiatric: He has a normal mood and affect. His behavior is normal. Judgment and thought content normal.    ED Course  Reduction of dislocation Date/Time: 12/13/2013 1:45 AM Performed by: Olivia Mackie Authorized by: Olivia Mackie Consent: Verbal consent obtained. Risks and benefits: risks, benefits and alternatives were discussed Preparation: Patient was prepped  and draped in the usual sterile fashion. Local anesthesia used: yes Anesthesia: digital block Local anesthetic: lidocaine 1% without epinephrine Anesthetic total: 5 ml Patient sedated: no Patient tolerance: Patient tolerated the procedure well with no immediate complications. Comments: Left 5th finger reduced without difficulty, but by xrays only distal dislocation reduced.  Reattempt completed, with good reduction of all dislocations  SPLINT APPLICATION Date/Time: 12/13/2013 1:46 AM Performed by: Olivia Mackie Authorized by: Olivia Mackie Consent: Verbal consent obtained. Risks and benefits: risks, benefits and alternatives were discussed Consent given by: patient Location details: left small finger Post-procedure: The splinted body part was neurovascularly unchanged following the procedure. Patient tolerance: Patient tolerated the procedure well with no immediate complications. Comments: Buddy taping completed by me, then removed for re-attempt at dislocation of pip.  Aluminum splint placed by me after reduction  confirmed.   (including critical care time) Labs Review Labs Reviewed  CBC WITH DIFFERENTIAL - Abnormal; Notable for the following:    WBC 12.8 (*)    Neutro Abs 9.7 (*)    All other components within normal limits  COMPREHENSIVE METABOLIC PANEL - Abnormal; Notable for the following:    Potassium 5.5 (*)    Glucose, Bld 133 (*)    BUN 27 (*)    All other components within normal limits  LIPASE, BLOOD  URINALYSIS, ROUTINE W REFLEX MICROSCOPIC    Imaging Review Dg Ribs Unilateral W/chest Left  12/13/2013   CLINICAL DATA:  Status post fall; left flank and back pain.  EXAM: LEFT RIBS AND CHEST - 3+ VIEW  COMPARISON:  Chest radiograph performed 07/25/2013  FINDINGS: No displaced rib fractures are seen.  The lungs are well-aerated. Minimal left basilar opacity likely reflects atelectasis. There is no evidence of focal opacification, pleural effusion or pneumothorax.The  cardiomediastinal silhouette is within normal limits. No acute osseous abnormalities are seen.  IMPRESSION: Minimal left basilar airspace opacity likely reflects atelectasis. Lungs otherwise clear. No displaced rib fracture seen.   Electronically Signed   By: Roanna RaiderJeffery  Chang M.D.   On: 12/13/2013 01:00   Ct Chest W Contrast  12/13/2013   CLINICAL DATA:  FALL  EXAM: CT CHEST, ABDOMEN, AND PELVIS WITH CONTRAST  TECHNIQUE: Multidetector CT imaging of the chest, abdomen and pelvis was performed following the standard protocol during bolus administration of intravenous contrast.  CONTRAST:  100mL OMNIPAQUE IOHEXOL 300 MG/ML  SOLN  COMPARISON:  None available.  FINDINGS: CT CHEST FINDINGS  Thyroid gland is normal.  No pathologically enlarged mediastinal, hilar, or axillary lymph nodes are identified.  Intrathoracic aorta is intact and of normal caliber. Great vessels within normal limits. No mediastinal hematoma. No evidence of acute traumatic aortic injury.  Heart size is at the upper limits of normal. Diffuse 3 vessel coronary artery calcifications present. No pericardial effusion. Pulmonary arteries grossly within normal limits.  The lungs are clear without pneumothorax or pulmonary contusion. Mild upper lobe predominant centrilobular emphysema present. Subsegmental atelectasis seen dependently within the bilateral lower lobes. No focal infiltrate. No pleural effusion or hemothorax.  No acute osseous abnormality within the chest.  CT ABDOMEN AND PELVIS FINDINGS  Liver is intact and demonstrates a normal contrast enhanced appearance. Gallbladder within normal limits. Spleen is intact. No perisplenic hematoma.  Hypodense nodule measuring 1.2 cm seen within the left adrenal gland. This demonstrates a density of 26 Hounsfield units. A in 2 cm hypodense nodule demonstrating a density of 30 ounce with the mid seen within the right adrenal gland. Findings indeterminate.  3 cm simple exophytic cyst noted within the left  kidney. Additional smaller cysts noted within the interpolar left kidney. The kidneys are otherwise unremarkable without evidence of traumatic injury, nephrolithiasis, hydronephrosis, or focal enhancing renal mass.  Stomach within normal limits. No evidence of bowel obstruction. No acute bowel injury. Appendix is normal. No inflammatory changes seen about the bowels. Colonic diverticulosis without acute diverticulitis.  Bladder is within normal limits.  Prostate unremarkable.  Normal intravascular enhancement seen throughout the abdomen and pelvis. No contrast extravasation. Mild calcified atheromatous disease seen within the infrarenal aorta.  No free air or fluid.  No mesenteric or retroperitoneal hematoma.  No enlarged lymph nodes identified within the abdomen and pelvis.  No acute fracture seen within the pelvis. No fracture seen within the visualized spine. No worrisome lytic or blastic osseous lesions.  IMPRESSION: 1.  No CT evidence of acute traumatic aortic injury. 2. No other acute traumatic injury within the chest, abdomen, or pelvis. 3. Bilateral hypodense adrenal nodules as above, indeterminate. Follow-up examination with dedicated MRI could be performed on a nonemergent basis for further evaluation. 4. Colonic diverticulosis without acute diverticulitis. 5. Diffuse 3 vessel coronary artery calcifications.   Electronically Signed   By: Rise Mu M.D.   On: 12/13/2013 02:35   Ct Abdomen Pelvis W Contrast  12/13/2013   CLINICAL DATA:  FALL  EXAM: CT CHEST, ABDOMEN, AND PELVIS WITH CONTRAST  TECHNIQUE: Multidetector CT imaging of the chest, abdomen and pelvis was performed following the standard protocol during bolus administration of intravenous contrast.  CONTRAST:  OMNIPAQUE IOHEXOL 300 MG/ML  SOLN  COMPARISON:  None available.  FINDINGS: CT CHEST FINDINGS  Thyroid gland is normal.  No pathologically enlarged mediastinal, hilar, or axillary lymph nodes are identified.  Intrathoracic  aorta is intact and of normal caliber. Great vessels within normal limits. No mediastinal hematoma. No evidence of acute traumatic aortic injury.  Heart size is at the upper limits of normal. Diffuse 3 vessel coronary artery calcifications present. No pericardial effusion. Pulmonary arteries grossly within normal limits.  The lungs are clear without pneumothorax or pulmonary contusion. Mild upper lobe predominant centrilobular emphysema present. Subsegmental atelectasis seen dependently within the bilateral lower lobes. No focal infiltrate. No pleural effusion or hemothorax.  No acute osseous abnormality within the chest.  CT ABDOMEN AND PELVIS FINDINGS  Liver is intact and demonstrates a normal contrast enhanced appearance. Gallbladder within normal limits. Spleen is intact. No perisplenic hematoma.  Hypodense nodule measuring 1.2 cm seen within the left adrenal gland. This demonstrates a density of 26 Hounsfield units. A in 2 cm hypodense nodule demonstrating a density of 30 ounce with the mid seen within the right adrenal gland. Findings indeterminate.  3 cm simple exophytic cyst noted within the left kidney. Additional smaller cysts noted within the interpolar left kidney. The kidneys are otherwise unremarkable without evidence of traumatic injury, nephrolithiasis, hydronephrosis, or focal enhancing renal mass.  Stomach within normal limits. No evidence of bowel obstruction. No acute bowel injury. Appendix is normal. No inflammatory changes seen about the bowels. Colonic diverticulosis without acute diverticulitis.  Bladder is within normal limits.  Prostate unremarkable.  Normal intravascular enhancement seen throughout the abdomen and pelvis. No contrast extravasation. Mild calcified atheromatous disease seen within the infrarenal aorta.  No free air or fluid.  No mesenteric or retroperitoneal hematoma.  No enlarged lymph nodes identified within the abdomen and pelvis.  No acute fracture seen within the  pelvis. No fracture seen within the visualized spine. No worrisome lytic or blastic osseous lesions.  IMPRESSION: 1. No CT evidence of acute traumatic aortic injury. 2. No other acute traumatic injury within the chest, abdomen, or pelvis. 3. Bilateral hypodense adrenal nodules as above, indeterminate. Follow-up examination with dedicated MRI could be performed on a nonemergent basis for further evaluation. 4. Colonic diverticulosis without acute diverticulitis. 5. Diffuse 3 vessel coronary artery calcifications.   Electronically Signed   By: Rise Mu M.D.   On: 12/13/2013 02:35   Dg Knee Complete 4 Views Left  12/13/2013   CLINICAL DATA:  Status post fall; left knee pain.  EXAM: LEFT KNEE - COMPLETE 4+ VIEW  COMPARISON:  None.  FINDINGS: An apparent tiny osseous fragment is suggested overlying the tibial spine; this is of uncertain age, and could also reflect an osteophyte. Would correlate clinically to exclude cruciate  ligament avulsion injury.  The joint spaces are preserved. An enthesophyte is seen arising at the upper pole of the patella. Minimal osteophyte formation is noted at the patella.  A small knee joint effusion is seen. The visualized soft tissues are normal in appearance.  IMPRESSION: 1. Apparent tiny osseous fragment suggested overlying the tibial spine. This is of uncertain age, and could also simply reflect an osteophyte. Would correlate clinically to exclude acute cruciate ligament avulsion injury. 2. Small knee joint effusion noted.   Electronically Signed   By: Roanna RaiderJeffery  Chang M.D.   On: 12/13/2013 01:02   Dg Hand Complete Left  12/13/2013   CLINICAL DATA:  Status post fall, with left fifth finger deformity.  EXAM: LEFT HAND - COMPLETE 3+ VIEW  COMPARISON:  None.  FINDINGS: There is ulnar and dorsal dislocation of the fifth proximal interphalangeal joint, and dorsal dislocation of the fifth distal interphalangeal joint, with a likely tiny avulsion fracture arising from the base  of the fifth middle phalanx, and mild impaction at the base of the fifth middle phalanx against the dorsal aspect of the fifth proximal phalanx.  No additional fractures are seen. Visualized joint spaces are otherwise preserved. Soft tissue swelling is noted about the fifth finger. The carpal rows appear grossly intact, and demonstrate normal alignment.  IMPRESSION: Ulnar and dorsal dislocation of the fifth proximal interphalangeal joint, and dorsal dislocation of the fifth distal interphalangeal joint, with a likely tiny avulsion fracture arising from the base of the fifth middle phalanx, and mild impaction at the base of the fifth middle phalanx against the dorsal aspect of the fifth proximal phalanx.   Electronically Signed   By: Roanna RaiderJeffery  Chang M.D.   On: 12/13/2013 00:59   Dg Finger Little Left  12/13/2013   CLINICAL DATA:  Status post reduction of left fifth finger dislocation.  EXAM: LEFT LITTLE FINGER 2+V  COMPARISON:  Left fifth finger radiographs performed earlier today at 2:11 a.m.  FINDINGS: There has been successful reduction of the fifth finger dislocations; the fifth finger is noted in expected alignment. Small avulsion fragments are noted volar to the proximal interphalangeal joint. No new fractures are seen. Soft tissue swelling is noted about the fifth digit.  IMPRESSION: Successful reduction of fifth finger dislocations. Small avulsion fragments again noted volar to the proximal interphalangeal joint. No new fractures seen.   Electronically Signed   By: Roanna RaiderJeffery  Chang M.D.   On: 12/13/2013 03:19   Dg Finger Little Left  12/13/2013   CLINICAL DATA:  Postreduction  EXAM: LEFT LITTLE FINGER 2+V  COMPARISON:  Plain film 12/13/2013 at 0034 hr  FINDINGS: There is persistent subluxation of the proximal interphalangeal joint of the fifth digit by and 1/2 bone with. There is persistent subluxation dorsally by a full bone with seen on lateral projection. There is a avulsion fracture fragment adjacent  to the proximal interphalangeal joint. The distal interphalangeal joint is located.  IMPRESSION: Persistent dislocation of the proximal interphalangeal joint. Small avulsion fracture.  The distal interphalangeal joint is located.   Electronically Signed   By: Genevive BiStewart  Edmunds M.D.   On: 12/13/2013 02:26     EKG Interpretation None      MDM   Final diagnoses:  Fall at home, initial encounter  Dislocation of fifth finger, left, closed, initial encounter  Left knee sprain, initial encounter  Chest wall contusion, left, initial encounter  Contusion, flank, initial encounter   68 year old male status post fall, concern for possible rib fractures, splenic  injury given abdominal pain.  He has a fracture there or dislocation of his left pinky finger and pain to his left knee.  Plan for labs, x-ray, CT scans.  Olivia Mackie, MD 12/13/13 [phone]

## 2013-12-13 ENCOUNTER — Emergency Department (HOSPITAL_COMMUNITY): Payer: Non-veteran care

## 2013-12-13 ENCOUNTER — Encounter (HOSPITAL_COMMUNITY): Payer: Self-pay | Admitting: Radiology

## 2013-12-13 LAB — COMPREHENSIVE METABOLIC PANEL
ALBUMIN: 4.2 g/dL (ref 3.5–5.2)
ALK PHOS: 81 U/L (ref 39–117)
ALT: 29 U/L (ref 0–53)
AST: 21 U/L (ref 0–37)
BILIRUBIN TOTAL: 0.4 mg/dL (ref 0.3–1.2)
BUN: 27 mg/dL — ABNORMAL HIGH (ref 6–23)
CHLORIDE: 101 meq/L (ref 96–112)
CO2: 24 mEq/L (ref 19–32)
Calcium: 10 mg/dL (ref 8.4–10.5)
Creatinine, Ser: 0.73 mg/dL (ref 0.50–1.35)
GFR calc Af Amer: >90 mL/min (ref >90)
GFR calc non Af Amer: >90 mL/min (ref >90)
Glucose, Bld: 133 mg/dL — ABNORMAL HIGH (ref 70–99)
POTASSIUM: 5.5 meq/L — AB (ref 3.7–5.3)
SODIUM: 140 meq/L (ref 137–147)
Total Protein: 7.6 g/dL (ref 6.0–8.3)

## 2013-12-13 LAB — URINALYSIS, ROUTINE W REFLEX MICROSCOPIC
BILIRUBIN URINE: NEGATIVE
Glucose, UA: NEGATIVE mg/dL
Ketones, ur: NEGATIVE mg/dL
Leukocytes, UA: NEGATIVE
NITRITE: NEGATIVE
Protein, ur: NEGATIVE mg/dL
Urobilinogen, UA: 0.2 mg/dL (ref 0.0–1.0)
pH: 5 (ref 5.0–8.0)

## 2013-12-13 LAB — URINE MICROSCOPIC-ADD ON

## 2013-12-13 LAB — LIPASE, BLOOD: Lipase: 47 U/L (ref 11–59)

## 2013-12-13 MED ORDER — MORPHINE SULFATE 4 MG/ML IJ SOLN
4.0000 mg | Freq: Once | INTRAMUSCULAR | Status: AC
  Administered 2013-12-13: 4 mg via INTRAVENOUS
  Filled 2013-12-13: qty 1

## 2013-12-13 MED ORDER — IOHEXOL 300 MG/ML  SOLN
100.0000 mL | Freq: Once | INTRAMUSCULAR | Status: AC | PRN
  Administered 2013-12-13: 100 mL via INTRAVENOUS

## 2013-12-13 MED ORDER — ONDANSETRON 8 MG PO TBDP: 8.0000 mg | ORAL_TABLET | Freq: Three times a day (TID) | ORAL | Status: DC | PRN

## 2013-12-13 MED ORDER — OXYCODONE-ACETAMINOPHEN 5-325 MG PO TABS: 2.0000 | ORAL_TABLET | ORAL | Status: DC | PRN

## 2013-12-13 NOTE — ED Notes (Signed)
CT called about delay 

## 2013-12-13 NOTE — Discharge Instructions (Signed)
Blunt Chest Trauma Blunt chest trauma is an injury caused by a blow to the chest. These chest injuries can be very painful. Blunt chest trauma often results in bruised or broken (fractured) ribs. Most cases of bruised and fractured ribs from blunt chest traumas get better after 1 to 3 weeks of rest and pain medicine. Often, the soft tissue in the chest wall is also injured, causing pain and bruising. Internal organs, such as the heart and lungs, may also be injured. Blunt chest trauma can lead to serious medical problems. This injury requires immediate medical care. CAUSES   Motor vehicle collisions.  Falls.  Physical violence.  Sports injuries. SYMPTOMS   Chest pain. The pain may be worse when you move or breathe deeply.  Shortness of breath.  Lightheadedness.  Bruising.  Tenderness.  Swelling. DIAGNOSIS  Your caregiver will do a physical exam. X-rays may be taken to look for fractures. However, minor rib fractures may not show up on X-rays until a few days after the injury. If a more serious injury is suspected, further imaging tests may be done. This may include ultrasounds, computed tomography (CT) scans, or magnetic resonance imaging (MRI). TREATMENT  Treatment depends on the severity of your injury. Your caregiver may prescribe pain medicines and deep breathing exercises. HOME CARE INSTRUCTIONS  Limit your activities until you can move around without much pain.  Do not do any strenuous work until your injury is healed.  Put ice on the injured area.  Put ice in a plastic bag.  Place a towel between your skin and the bag.  Leave the ice on for 15-20 minutes, 03-04 times a day.  You may wear a rib belt as directed by your caregiver to reduce pain.  Practice deep breathing as directed by your caregiver to keep your lungs clear.  Only take over-the-counter or prescription medicines for pain, fever, or discomfort as directed by your caregiver. SEEK IMMEDIATE MEDICAL  CARE IF:   You have increasing pain or shortness of breath.  You cough up blood.  You have nausea, vomiting, or abdominal pain.  You have a fever.  You feel dizzy, weak, or you faint. MAKE SURE YOU:  Understand these instructions.  Will watch your condition.  Will get help right away if you are not doing well or get worse. Document Released: 07/20/2004 Document Revised: 09/04/2011 Document Reviewed: 03/29/2011 [facility] Patient Information 2015 [place], [place]. This information is not intended to replace advice given to you by your health care provider. Make sure you discuss any questions you have with your health care provider.   Chest Contusion A chest contusion is a deep bruise on your chest area. Contusions are the result of an injury that caused bleeding under the skin. A chest contusion may involve bruising of the skin, muscles, or ribs. The contusion may turn blue, purple, or yellow. Minor injuries will give you a painless contusion, but more severe contusions may stay painful and swollen for a few weeks. CAUSES  A contusion is usually caused by a blow, trauma, or direct force to an area of the body. SYMPTOMS   Swelling and redness of the injured area.  Discoloration of the injured area.  Tenderness and soreness of the injured area.  Pain. DIAGNOSIS  The diagnosis can be made by taking a history and performing a physical exam. An X-ray, CT scan, or MRI may be needed to determine if there were any associated injuries, such as broken bones (fractures) or internal injuries. TREATMENT  Often, the best treatment for a chest contusion is resting, icing, and applying cold compresses to the injured area. Deep breathing exercises may be recommended to reduce the risk of pneumonia. Over-the-counter medicines may also be recommended for pain control. HOME CARE INSTRUCTIONS   Put ice on the injured area.  Put ice in a plastic bag.  Place a towel between your skin and the  bag.  Leave the ice on for 15-20 minutes, 03-04 times a day.  Only take over-the-counter or prescription medicines as directed by your caregiver. Your caregiver may recommend avoiding anti-inflammatory medicines (aspirin, ibuprofen, and naproxen) for 48 hours because these medicines may increase bruising.  Rest the injured area.  Perform deep-breathing exercises as directed by your caregiver.  Stop smoking if you smoke.  Do not lift objects over 5 pounds (2.3 kg) for 3 days or longer if recommended by your caregiver. SEEK IMMEDIATE MEDICAL CARE IF:   You have increased bruising or swelling.  You have pain that is getting worse.  You have difficulty breathing.  You have dizziness, weakness, or fainting.  You have blood in your urine or stool.  You cough up or vomit blood.  Your swelling or pain is not relieved with medicines. MAKE SURE YOU:   Understand these instructions.  Will watch your condition.  Will get help right away if you are not doing well or get worse. Document Released: 03/07/2001 Document Revised: 03/06/2012 Document Reviewed: 12/04/2011 [facility] Patient Information 2015 [place]. This information is not intended to replace advice given to you by your health care provider. Make sure you discuss any questions you have with your health care provider.  Finger Dislocation Finger dislocation is the displacement of bones in your finger at the joints. Most commonly, finger dislocation occurs at the proximal interphalangeal joint (the joint closest to your knuckle). Very strong, fibrous tissues (ligaments) and joint capsules connect the three bones of your fingers.  CAUSES Dislocation is caused by a forceful impact. This impact moves these bones off the joint and often tears your ligaments.  SYMPTOMS Symptoms of finger dislocation include:  Deformity of your finger.  Pain, with loss of movement. DIAGNOSIS  Finger dislocation is diagnosed with a physical  exam. Often, X-ray exams are done to see if you have associated injuries, such as bone fractures. TREATMENT  Finger dislocations are treated by putting your bones back into position (reduction) either by manually moving the bones back into place or through surgery. Your finger is then kept in a fixed position (immobilized) with the use of a dressing or splint for a brief period. When your ligament has to be surgically repaired, it needs to be kept in a fixed position with a dressing or splint for 1 to 2 weeks. Because joint stiffness is a long-term complication of finger dislocation, hand exercises or physical therapy to increase the range of motion and to regain strength is usually started as soon as the ligament is healed. Exercises and therapy generally last no more than 3 months. HOME CARE INSTRUCTIONS The following measures can help to reduce pain and speed up the healing process:  Rest your injured joint. Do not move until instructed otherwise by your caregiver. Avoid activities similar to the one that caused your injury.  Apply ice to your injured joint for the first day or 2 after your reduction or as directed by your caregiver. Applying ice helps to reduce inflammation and pain.  Put ice in a plastic bag.  Place a towel  between your skin and the bag.  Leave the ice on for 15-20 minutes at a time, every 2 hours while you are awake.  Elevate your hand above your heart as directed by your caregiver to reduce swelling.  Take over-the-counter or prescription medicine for pain as your caregiver instructs you. SEEK IMMEDIATE MEDICAL CARE IF:  Your dressing or splint becomes damaged.  Your pain becomes worse rather than better.  You lose feeling in your finger, or it becomes cold and white. MAKE SURE YOU:  Understand these instructions.  Will watch your condition.  Will get help right away if you are not doing well or get worse. Document Released: 06/09/2000 Document Revised:  09/04/2011 Document Reviewed: 04/02/2011 [facility] Patient Information 2015 [place], [place]. This information is not intended to replace advice given to you by your health care provider. Make sure you discuss any questions you have with your health care provider.  Knee Sprain A knee sprain is a tear in one of the strong, fibrous tissues that connect the bones (ligaments) in your knee. The severity of the sprain depends on how much of the ligament is torn. The tear can be either partial or complete. CAUSES  Often, sprains are a result of a fall or injury. The force of the impact causes the fibers of your ligament to stretch too much. This excess tension causes the fibers of your ligament to tear. SIGNS AND SYMPTOMS  You may have some loss of motion in your knee. Other symptoms include:  Bruising.  Pain in the knee area.  Tenderness of the knee to the touch.  Swelling. DIAGNOSIS  To diagnose a knee sprain, your health care provider will physically examine your knee. Your health care provider may also suggest an X-ray exam of your knee to make sure no bones are broken. TREATMENT  If your ligament is only partially torn, treatment usually involves keeping the knee in a fixed position (immobilization) or bracing your knee for activities that require movement for several weeks. To do this, your health care provider will apply a bandage, cast, or splint to keep your knee from moving and to support your knee during movement until it heals. For a partially torn ligament, the healing process usually takes 4-6 weeks. If your ligament is completely torn, depending on which ligament it is, you may need surgery to reconnect the ligament to the bone or reconstruct it. After surgery, a cast or splint may be applied and will need to stay on your knee for 4-6 weeks while your ligament heals. HOME CARE INSTRUCTIONS  Keep your injured knee elevated to decrease swelling.  To ease pain and swelling, apply ice to  the injured area:  Put ice in a plastic bag.  Place a towel between your skin and the bag.  Leave the ice on for 20 minutes, 2-3 times a day.  Only take medicine for pain as directed by your health care provider.  Do not leave your knee unprotected until pain and stiffness go away (usually 4-6 weeks).  If you have a cast or splint, do not allow it to get wet. If you have been instructed not to remove it, cover it with a plastic bag when you shower or bathe. Do not swim.  Your health care provider may suggest exercises for you to do during your recovery to prevent or limit permanent weakness and stiffness. SEEK IMMEDIATE MEDICAL CARE IF:  Your cast or splint becomes damaged.  Your pain becomes worse.  You have  significant pain, swelling, or numbness below the cast or splint. MAKE SURE YOU:  Understand these instructions.  Will watch your condition.  Will get help right away if you are not doing well or get worse. Document Released: 06/12/2005 Document Revised: 04/02/2013 Document Reviewed: 01/22/2013 [facility] Patient Information 2015 [place], [place]. This information is not intended to replace advice given to you by your health care provider. Make sure you discuss any questions you have with your health care provider.

## 2013-12-13 NOTE — ED Notes (Signed)
Instructed on the use of the incentive spirometer - demonstrated back to this nurse.  Crutches given and instructed on their use.  Instructed on the use of a splint pillow to hold on his left ribs  Voiced understanding.

## 2013-12-13 NOTE — ED Notes (Signed)
Patient transported to CT 

## 2013-12-28 ENCOUNTER — Emergency Department (HOSPITAL_COMMUNITY): Payer: Non-veteran care

## 2013-12-28 ENCOUNTER — Encounter (HOSPITAL_COMMUNITY): Payer: Self-pay | Admitting: Emergency Medicine

## 2013-12-28 ENCOUNTER — Emergency Department (HOSPITAL_COMMUNITY)
Admission: EM | Admit: 2013-12-28 | Discharge: 2013-12-28 | Disposition: A | Discharge status: Home Health | Payer: Non-veteran care | Attending: Emergency Medicine | Admitting: Emergency Medicine

## 2013-12-28 DIAGNOSIS — I252 Old myocardial infarction: Secondary | ICD-10-CM | POA: Insufficient documentation

## 2013-12-28 DIAGNOSIS — E7800 Pure hypercholesterolemia, unspecified: Secondary | ICD-10-CM | POA: Insufficient documentation

## 2013-12-28 DIAGNOSIS — I10 Essential (primary) hypertension: Secondary | ICD-10-CM | POA: Insufficient documentation

## 2013-12-28 DIAGNOSIS — Z9861 Coronary angioplasty status: Secondary | ICD-10-CM | POA: Insufficient documentation

## 2013-12-28 DIAGNOSIS — Y9289 Other specified places as the place of occurrence of the external cause: Secondary | ICD-10-CM | POA: Insufficient documentation

## 2013-12-28 DIAGNOSIS — R1012 Left upper quadrant pain: Secondary | ICD-10-CM | POA: Insufficient documentation

## 2013-12-28 DIAGNOSIS — Z7982 Long term (current) use of aspirin: Secondary | ICD-10-CM | POA: Insufficient documentation

## 2013-12-28 DIAGNOSIS — Z79899 Other long term (current) drug therapy: Secondary | ICD-10-CM | POA: Insufficient documentation

## 2013-12-28 DIAGNOSIS — Z8673 Personal history of transient ischemic attack (TIA), and cerebral infarction without residual deficits: Secondary | ICD-10-CM | POA: Insufficient documentation

## 2013-12-28 DIAGNOSIS — R296 Repeated falls: Secondary | ICD-10-CM | POA: Insufficient documentation

## 2013-12-28 DIAGNOSIS — I2510 Atherosclerotic heart disease of native coronary artery without angina pectoris: Secondary | ICD-10-CM | POA: Insufficient documentation

## 2013-12-28 DIAGNOSIS — Z8701 Personal history of pneumonia (recurrent): Secondary | ICD-10-CM | POA: Insufficient documentation

## 2013-12-28 DIAGNOSIS — Y9389 Activity, other specified: Secondary | ICD-10-CM | POA: Insufficient documentation

## 2013-12-28 DIAGNOSIS — Z8669 Personal history of other diseases of the nervous system and sense organs: Secondary | ICD-10-CM | POA: Insufficient documentation

## 2013-12-28 DIAGNOSIS — Z8659 Personal history of other mental and behavioral disorders: Secondary | ICD-10-CM | POA: Insufficient documentation

## 2013-12-28 DIAGNOSIS — Z87891 Personal history of nicotine dependence: Secondary | ICD-10-CM | POA: Insufficient documentation

## 2013-12-28 DIAGNOSIS — R52 Pain, unspecified: Secondary | ICD-10-CM | POA: Insufficient documentation

## 2013-12-28 LAB — URINALYSIS, ROUTINE W REFLEX MICROSCOPIC
Bilirubin Urine: NEGATIVE
GLUCOSE, UA: NEGATIVE mg/dL
Hgb urine dipstick: NEGATIVE
Ketones, ur: NEGATIVE mg/dL
Leukocytes, UA: NEGATIVE
Nitrite: NEGATIVE
Protein, ur: NEGATIVE mg/dL
Specific Gravity, Urine: 1.017 (ref 1.005–1.030)
UROBILINOGEN UA: 0.2 mg/dL (ref 0.0–1.0)
pH: 5 (ref 5.0–8.0)

## 2013-12-28 LAB — CBC WITH DIFFERENTIAL/PLATELET
BASOS ABS: 0 10*3/uL (ref 0.0–0.1)
Basophils Relative: 0 % (ref 0–1)
EOS ABS: 0.1 10*3/uL (ref 0.0–0.7)
EOS PCT: 2 % (ref 0–5)
HCT: 37.5 % — ABNORMAL LOW (ref 39.0–52.0)
Hemoglobin: 13.2 g/dL (ref 13.0–17.0)
Lymphocytes Relative: 33 % (ref 12–46)
Lymphs Abs: 1.9 10*3/uL (ref 0.7–4.0)
MCH: 31.7 pg (ref 26.0–34.0)
MCHC: 35.2 g/dL (ref 30.0–36.0)
MCV: 89.9 fL (ref 78.0–100.0)
Monocytes Absolute: 0.5 10*3/uL (ref 0.1–1.0)
Monocytes Relative: 8 % (ref 3–12)
Neutro Abs: 3.2 10*3/uL (ref 1.7–7.7)
Neutrophils Relative %: 57 % (ref 43–77)
PLATELETS: 170 10*3/uL (ref 150–400)
RBC: 4.17 MIL/uL — ABNORMAL LOW (ref 4.22–5.81)
RDW: 12.4 % (ref 11.5–15.5)
WBC: 5.6 10*3/uL (ref 4.0–10.5)

## 2013-12-28 LAB — COMPREHENSIVE METABOLIC PANEL
ALT: 19 U/L (ref 0–53)
AST: 15 U/L (ref 0–37)
Albumin: 3.5 g/dL (ref 3.5–5.2)
Alkaline Phosphatase: 85 U/L (ref 39–117)
Anion gap: 10 (ref 5–15)
BUN: 14 mg/dL (ref 6–23)
CO2: 28 mEq/L (ref 19–32)
Calcium: 8.9 mg/dL (ref 8.4–10.5)
Chloride: 108 mEq/L (ref 96–112)
Creatinine, Ser: 0.69 mg/dL (ref 0.50–1.35)
GFR calc Af Amer: >90 mL/min (ref >90)
GFR calc non Af Amer: >90 mL/min (ref >90)
GLUCOSE: 108 mg/dL — AB (ref 70–99)
Potassium: 4.1 mEq/L (ref 3.7–5.3)
SODIUM: 146 meq/L (ref 137–147)
TOTAL PROTEIN: 6.7 g/dL (ref 6.0–8.3)
Total Bilirubin: 0.4 mg/dL (ref 0.3–1.2)

## 2013-12-28 LAB — I-STAT CG4 LACTIC ACID, ED: Lactic Acid, Venous: 0.87 mmol/L (ref 0.5–2.2)

## 2013-12-28 LAB — LIPASE, BLOOD: Lipase: 35 U/L (ref 11–59)

## 2013-12-28 MED ORDER — MORPHINE SULFATE 4 MG/ML IJ SOLN
4.0000 mg | Freq: Once | INTRAMUSCULAR | Status: AC
  Administered 2013-12-28: 4 mg via INTRAVENOUS
  Filled 2013-12-28: qty 1

## 2013-12-28 MED ORDER — IOHEXOL 300 MG/ML  SOLN
100.0000 mL | Freq: Once | INTRAMUSCULAR | Status: AC | PRN
  Administered 2013-12-28: 100 mL via INTRAVENOUS

## 2013-12-28 MED ORDER — SODIUM CHLORIDE 0.9 % IV BOLUS (SEPSIS)
500.0000 mL | Freq: Once | INTRAVENOUS | Status: AC
  Administered 2013-12-28: 500 mL via INTRAVENOUS

## 2013-12-28 MED ORDER — FENTANYL CITRATE 0.05 MG/ML IJ SOLN: 75.0000 ug | INTRAMUSCULAR | Status: DC | PRN

## 2013-12-28 MED ORDER — HYDROCODONE-ACETAMINOPHEN 5-325 MG PO TABS: 1.0000 | ORAL_TABLET | ORAL | Status: DC | PRN

## 2013-12-28 NOTE — ED Notes (Signed)
Urinal given to pt.  Pt wife sitting at bedside.  States he does not need pain meds yet. Hurts when moving

## 2013-12-28 NOTE — ED Provider Notes (Signed)
CSN: [id number]     Arrival date & time 12/28/13  0906 History   First MD Initiated Contact with Patient 12/28/13 [phone]     Chief Complaint  Patient presents with  . Abdominal Pain     (Consider location/radiation/quality/duration/timing/severity/associated sxs/prior Treatment) HPI Comments: 68 year old male with history of stroke, CAD, recent ED visit after mechanical fall and bruised ribs presents with left lower rib and left upper quadrant abdominal pain since he sneezed late last night causing acute pain and swelling sensation. Patient has no other new injuries and has had intermittent knee pain chronically. Pain is worse with palpation and movement. Patient has had mild nonproductive cough, no fevers.  Patient is a 68 y.o. male presenting with abdominal pain. The history is provided by the patient.  Abdominal Pain Associated symptoms: no chest pain, no chills, no dysuria, no fever, no shortness of breath and no vomiting     Past Medical History  Diagnosis Date  . Coronary artery disease   . Pneumonia   . Anxiety   . Depression   . Hypertension   . Hypercholesteremia   . Myocardial infarction 2010ish  . PTSD (post-traumatic stress disorder)   . Sleep apnea      doesnt use CPAP  . Stroke     slight weakness left- 85- 90 % returned  . Carpal tunnel syndrome   . Hepatitis    Past Surgical History  Procedure Laterality Date  . Cardiac catheterization    . Coronary angioplasty    . Stents  2012  . Eye surgery Bilateral     cataract  . Knee arthroscopy Right   . Carpal tunnel release Right 06/10/2013    Procedure: RIGHT CARPAL TUNNEL RELEASE;  Surgeon: Kathryne Hitch, MD;  Location: [facility] OR;  Service: Orthopedics;  Laterality: Right;   Family History  Problem Relation Age of Onset  . Heart disease Mother     Several other maternal family members   History  Substance Use Topics  . Smoking status: Former Smoker -- 2.00 packs/day for 20 years    Types: Cigarettes     Quit date: 11/21/1987  . Smokeless tobacco: Never Used  . Alcohol Use: No    Review of Systems  Constitutional: Negative for fever and chills.  HENT: Negative for congestion.   Eyes: Negative for visual disturbance.  Respiratory: Negative for shortness of breath.   Cardiovascular: Negative for chest pain.  Gastrointestinal: Positive for abdominal pain. Negative for vomiting.  Genitourinary: Positive for flank pain. Negative for dysuria.  Musculoskeletal: Negative for back pain, neck pain and neck stiffness.  Skin: Negative for rash.  Neurological: Negative for light-headedness and headaches.      Allergies  Clindamycin/lincomycin and Dilaudid  Home Medications   Prior to Admission medications   Medication Sig Start Date End Date Taking? Authorizing Provider  albuterol (PROVENTIL HFA;VENTOLIN HFA) 108 (90 BASE) MCG/ACT inhaler Inhale 2 puffs into the lungs every 6 (six) hours as needed for wheezing or shortness of breath. 07/26/13  Yes Esperanza Sheets, MD  aspirin 81 MG tablet Take 81 mg by mouth daily.   Yes Historical Provider, MD  atorvastatin (LIPITOR) 80 MG tablet Take 40 mg by mouth daily.   Yes Historical Provider, MD  clonazePAM (KLONOPIN) 1 MG tablet Take 1 mg by mouth at bedtime.    Yes Historical Provider, MD  clopidogrel (PLAVIX) 75 MG tablet Take 75 mg by mouth daily.     Yes Historical Provider, MD  etodolac (LODINE)  400 MG tablet Take 400 mg by mouth 2 (two) times daily as needed for mild pain.   Yes Historical Provider, MD  metoprolol succinate (TOPROL-XL) 50 MG 24 hr tablet Take 25 mg by mouth daily. Take with or immediately following a meal.   Yes Historical Provider, MD  naproxen sodium (ANAPROX) 220 MG tablet Take 220,440 mg by mouth daily as needed (for pain).   Yes Historical Provider, MD  nitroGLYCERIN (NITROSTAT) 0.4 MG SL tablet Place 0.4 mg under the tongue every 5 (five) minutes as needed. For chest pain    Yes Historical Provider, MD  oxycodone  (OXY-IR) 5 MG capsule Take 5 mg by mouth every 4 (four) hours as needed for pain.   Yes Historical Provider, MD  sertraline (ZOLOFT) 100 MG tablet Take 100 mg by mouth at bedtime.    Yes Historical Provider, MD  tiZANidine (ZANAFLEX) 4 MG tablet Take 12 mg by mouth at bedtime.   Yes Historical Provider, MD   BP 136/87  Pulse 66  Temp(Src) 97.8 F (36.6 C) (Oral)  Resp 25  SpO2 99% Physical Exam  Nursing note and vitals reviewed. Constitutional: He is oriented to person, place, and time. He appears well-developed and well-nourished.  HENT:  Head: Normocephalic and atraumatic.  Eyes: Conjunctivae are normal. Right eye exhibits no discharge. Left eye exhibits no discharge.  Neck: Normal range of motion. Neck supple. No tracheal deviation present.  Cardiovascular: Normal rate and regular rhythm.   Pulmonary/Chest: Effort normal and breath sounds normal.  Abdominal: Soft. He exhibits no distension. There is tenderness (left upper quadrant mild). There is no guarding.  Musculoskeletal: He exhibits tenderness (left lower rib cage flank no step-off). He exhibits no edema.  Neurological: He is alert and oriented to person, place, and time.  Skin: Skin is warm. No rash noted.  Psychiatric: He has a normal mood and affect.    ED Course  Procedures (including critical care time) Labs Review Labs Reviewed  CBC WITH DIFFERENTIAL - Abnormal; Notable for the following:    RBC 4.17 (*)    HCT 37.5 (*)    All other components within normal limits  COMPREHENSIVE METABOLIC PANEL - Abnormal; Notable for the following:    Glucose, Bld 108 (*)    All other components within normal limits  LIPASE, BLOOD  URINALYSIS, ROUTINE W REFLEX MICROSCOPIC  I-STAT CG4 LACTIC ACID, ED    Imaging Review Ct Abdomen Pelvis W Contrast  12/28/2013   CLINICAL DATA:  Patient fell 2 weeks ago. Of the off over the left upper quadrant.  EXAM: CT ABDOMEN AND PELVIS WITH CONTRAST  TECHNIQUE: Multidetector CT imaging of  the abdomen and pelvis was performed using the standard protocol following bolus administration of intravenous contrast.  CONTRAST:  OMNIPAQUE IOHEXOL 300 MG/ML  SOLN  COMPARISON:  12/13/2013  FINDINGS: The lung bases are clear.  The liver demonstrates no focal abnormality. There is no intrahepatic or extrahepatic biliary ductal dilatation. The gallbladder is normal. The spleen demonstrates no focal abnormality.There is a 2.1 cm hypodense right adrenal mass. There is a 1.9 cm hypodense left adrenal mass. There is a 3.4 cm hypodense, fluid attenuating left renal mass most consistent with a cyst. The right kidney and pancreas are normal. The bladder is unremarkable.  The stomach, duodenum, small intestine, and large intestine demonstrate no gross abnormality. There is diverticulosis without evidence of diverticulitis. There is a fat containing left inguinal hernia. There is a normal caliber appendix in the right lower  quadrant without periappendiceal inflammatory changes. There is no pneumoperitoneum, pneumatosis, or portal venous gas. There is no abdominal or pelvic free fluid. There is no lymphadenopathy.  The abdominal aorta is normal in caliber with atherosclerosis.  There are no lytic or sclerotic osseous lesions. There is a broad-based disc bulge at L4-5.  IMPRESSION: 1. No acute injury of the abdomen/pelvis. 2. Bilateral hypodense adrenal masses which are incompletely characterized on this exam. Recommend further evaluation with an MRI of the abdomen for characterization.   Electronically Signed   By: Elige Ko   On: 12/28/2013 12:52   Dg Chest Portable 1 View  12/28/2013   CLINICAL DATA:  Upper abdominal pain after coughing.  EXAM: PORTABLE CHEST - 1 VIEW  COMPARISON:  Chest and rib radiographs 12/13/2013.  FINDINGS: The heart is mildly enlarged. There is no edema or effusion to suggest failure. No focal airspace disease is present. The visualized soft tissues and bony thorax are unremarkable.   IMPRESSION: Negative one view chest x-ray.   Electronically Signed   By: Gennette Pac M.D.   On: 12/28/2013 10:43     EKG Interpretation None      MDM   Final diagnoses:  Abdominal pain, left upper quadrant   With recent injury and known rib tenderness and acute worsening after a sneeze concern for rib strain/fracture versus musculoskeletal left upper abdominal wall versus less likely splenic injury from a sneeze. Plan for pain meds, screening labs and reassessment. Patient did have a CT scan 2 weeks ago did was reviewed by myself did not show any acute abnormality.  Recheck patient's having significant pain discussed risks and benefits of CT scan CT scan performed and reviewed no acute findings. Patient improved on recheck and discuss outpatient followup.  Results and differential diagnosis were discussed with the patient/parent/guardian. Close follow up outpatient was discussed, comfortable with the plan.   Medications  sodium chloride 0.9 % bolus 500 mL (0 mLs Intravenous Stopped 12/28/13 1140)  morphine 4 MG/ML injection 4 mg (4 mg Intravenous Given 12/28/13 1139)  iohexol (OMNIPAQUE) 300 MG/ML solution 100 mL (100 mLs Intravenous Contrast Given 12/28/13 1237)    Filed Vitals:   12/28/13 1200 12/28/13 1249 12/28/13 1251 12/28/13 1300  BP: 136/85 119/52 119/52 117/52  Pulse: 55 63 47 50  Temp:   98.2 F (36.8 C)   TempSrc:   Oral   Resp: 15  12 19   SpO2: 98% 98% 99% 98%       Dg Ribs Unilateral W/chest Left  12/13/2013   CLINICAL DATA:  Status post fall; left flank and back pain.  EXAM: LEFT RIBS AND CHEST - 3+ VIEW  COMPARISON:  Chest radiograph performed 07/25/2013  FINDINGS: No displaced rib fractures are seen.  The lungs are well-aerated. Minimal left basilar opacity likely reflects atelectasis. There is no evidence of focal opacification, pleural effusion or pneumothorax.The cardiomediastinal silhouette is within normal limits. No acute osseous abnormalities are seen.   IMPRESSION: Minimal left basilar airspace opacity likely reflects atelectasis. Lungs otherwise clear. No displaced rib fracture seen.   Electronically Signed   By: Roanna Raider M.D.   On: 12/13/2013 01:00   Ct Chest W Contrast  12/13/2013   CLINICAL DATA:  FALL  EXAM: CT CHEST, ABDOMEN, AND PELVIS WITH CONTRAST  TECHNIQUE: Multidetector CT imaging of the chest, abdomen and pelvis was performed following the standard protocol during bolus administration of intravenous contrast.  CONTRAST:  OMNIPAQUE IOHEXOL 300 MG/ML  SOLN  COMPARISON:  None available.  FINDINGS: CT CHEST FINDINGS  Thyroid gland is normal.  No pathologically enlarged mediastinal, hilar, or axillary lymph nodes are identified.  Intrathoracic aorta is intact and of normal caliber. Great vessels within normal limits. No mediastinal hematoma. No evidence of acute traumatic aortic injury.  Heart size is at the upper limits of normal. Diffuse 3 vessel coronary artery calcifications present. No pericardial effusion. Pulmonary arteries grossly within normal limits.  The lungs are clear without pneumothorax or pulmonary contusion. Mild upper lobe predominant centrilobular emphysema present. Subsegmental atelectasis seen dependently within the bilateral lower lobes. No focal infiltrate. No pleural effusion or hemothorax.  No acute osseous abnormality within the chest.  CT ABDOMEN AND PELVIS FINDINGS  Liver is intact and demonstrates a normal contrast enhanced appearance. Gallbladder within normal limits. Spleen is intact. No perisplenic hematoma.  Hypodense nodule measuring 1.2 cm seen within the left adrenal gland. This demonstrates a density of 26 Hounsfield units. A in 2 cm hypodense nodule demonstrating a density of 30 ounce with the mid seen within the right adrenal gland. Findings indeterminate.  3 cm simple exophytic cyst noted within the left kidney. Additional smaller cysts noted within the interpolar left kidney. The kidneys are otherwise  unremarkable without evidence of traumatic injury, nephrolithiasis, hydronephrosis, or focal enhancing renal mass.  Stomach within normal limits. No evidence of bowel obstruction. No acute bowel injury. Appendix is normal. No inflammatory changes seen about the bowels. Colonic diverticulosis without acute diverticulitis.  Bladder is within normal limits.  Prostate unremarkable.  Normal intravascular enhancement seen throughout the abdomen and pelvis. No contrast extravasation. Mild calcified atheromatous disease seen within the infrarenal aorta.  No free air or fluid.  No mesenteric or retroperitoneal hematoma.  No enlarged lymph nodes identified within the abdomen and pelvis.  No acute fracture seen within the pelvis. No fracture seen within the visualized spine. No worrisome lytic or blastic osseous lesions.  IMPRESSION: 1. No CT evidence of acute traumatic aortic injury. 2. No other acute traumatic injury within the chest, abdomen, or pelvis. 3. Bilateral hypodense adrenal nodules as above, indeterminate. Follow-up examination with dedicated MRI could be performed on a nonemergent basis for further evaluation. 4. Colonic diverticulosis without acute diverticulitis. 5. Diffuse 3 vessel coronary artery calcifications.   Electronically Signed   By: Rise MuBenjamin  McClintock M.D.   On: 12/13/2013 02:35   Ct Abdomen Pelvis W Contrast  12/13/2013   CLINICAL DATA:  FALL  EXAM: CT CHEST, ABDOMEN, AND PELVIS WITH CONTRAST  TECHNIQUE: Multidetector CT imaging of the chest, abdomen and pelvis was performed following the standard protocol during bolus administration of intravenous contrast.  CONTRAST:  100mL OMNIPAQUE IOHEXOL 300 MG/ML  SOLN  COMPARISON:  None available.  FINDINGS: CT CHEST FINDINGS  Thyroid gland is normal.  No pathologically enlarged mediastinal, hilar, or axillary lymph nodes are identified.  Intrathoracic aorta is intact and of normal caliber. Great vessels within normal limits. No mediastinal hematoma. No  evidence of acute traumatic aortic injury.  Heart size is at the upper limits of normal. Diffuse 3 vessel coronary artery calcifications present. No pericardial effusion. Pulmonary arteries grossly within normal limits.  The lungs are clear without pneumothorax or pulmonary contusion. Mild upper lobe predominant centrilobular emphysema present. Subsegmental atelectasis seen dependently within the bilateral lower lobes. No focal infiltrate. No pleural effusion or hemothorax.  No acute osseous abnormality within the chest.  CT ABDOMEN AND PELVIS FINDINGS  Liver is intact and demonstrates a normal contrast enhanced appearance. Gallbladder within  normal limits. Spleen is intact. No perisplenic hematoma.  Hypodense nodule measuring 1.2 cm seen within the left adrenal gland. This demonstrates a density of 26 Hounsfield units. A in 2 cm hypodense nodule demonstrating a density of 30 ounce with the mid seen within the right adrenal gland. Findings indeterminate.  3 cm simple exophytic cyst noted within the left kidney. Additional smaller cysts noted within the interpolar left kidney. The kidneys are otherwise unremarkable without evidence of traumatic injury, nephrolithiasis, hydronephrosis, or focal enhancing renal mass.  Stomach within normal limits. No evidence of bowel obstruction. No acute bowel injury. Appendix is normal. No inflammatory changes seen about the bowels. Colonic diverticulosis without acute diverticulitis.  Bladder is within normal limits.  Prostate unremarkable.  Normal intravascular enhancement seen throughout the abdomen and pelvis. No contrast extravasation. Mild calcified atheromatous disease seen within the infrarenal aorta.  No free air or fluid.  No mesenteric or retroperitoneal hematoma.  No enlarged lymph nodes identified within the abdomen and pelvis.  No acute fracture seen within the pelvis. No fracture seen within the visualized spine. No worrisome lytic or blastic osseous lesions.   IMPRESSION: 1. No CT evidence of acute traumatic aortic injury. 2. No other acute traumatic injury within the chest, abdomen, or pelvis. 3. Bilateral hypodense adrenal nodules as above, indeterminate. Follow-up examination with dedicated MRI could be performed on a nonemergent basis for further evaluation. 4. Colonic diverticulosis without acute diverticulitis. 5. Diffuse 3 vessel coronary artery calcifications.   Electronically Signed   By: Rise Mu M.D.   On: 12/13/2013 02:35   Dg Knee Complete 4 Views Left  12/13/2013   CLINICAL DATA:  Status post fall; left knee pain.  EXAM: LEFT KNEE - COMPLETE 4+ VIEW  COMPARISON:  None.  FINDINGS: An apparent tiny osseous fragment is suggested overlying the tibial spine; this is of uncertain age, and could also reflect an osteophyte. Would correlate clinically to exclude cruciate ligament avulsion injury.  The joint spaces are preserved. An enthesophyte is seen arising at the upper pole of the patella. Minimal osteophyte formation is noted at the patella.  A small knee joint effusion is seen. The visualized soft tissues are normal in appearance.  IMPRESSION: 1. Apparent tiny osseous fragment suggested overlying the tibial spine. This is of uncertain age, and could also simply reflect an osteophyte. Would correlate clinically to exclude acute cruciate ligament avulsion injury. 2. Small knee joint effusion noted.   Electronically Signed   By: Roanna Raider M.D.   On: 12/13/2013 01:02   Dg Hand Complete Left  12/13/2013   CLINICAL DATA:  Status post fall, with left fifth finger deformity.  EXAM: LEFT HAND - COMPLETE 3+ VIEW  COMPARISON:  None.  FINDINGS: There is ulnar and dorsal dislocation of the fifth proximal interphalangeal joint, and dorsal dislocation of the fifth distal interphalangeal joint, with a likely tiny avulsion fracture arising from the base of the fifth middle phalanx, and mild impaction at the base of the fifth middle phalanx against the  dorsal aspect of the fifth proximal phalanx.  No additional fractures are seen. Visualized joint spaces are otherwise preserved. Soft tissue swelling is noted about the fifth finger. The carpal rows appear grossly intact, and demonstrate normal alignment.  IMPRESSION: Ulnar and dorsal dislocation of the fifth proximal interphalangeal joint, and dorsal dislocation of the fifth distal interphalangeal joint, with a likely tiny avulsion fracture arising from the base of the fifth middle phalanx, and mild impaction at the base of the  fifth middle phalanx against the dorsal aspect of the fifth proximal phalanx.   Electronically Signed   By: Roanna RaiderJeffery  Chang M.D.   On: 12/13/2013 00:59   Dg Finger Little Left  12/13/2013   CLINICAL DATA:  Status post reduction of left fifth finger dislocation.  EXAM: LEFT LITTLE FINGER 2+V  COMPARISON:  Left fifth finger radiographs performed earlier today at 2:11 a.m.  FINDINGS: There has been successful reduction of the fifth finger dislocations; the fifth finger is noted in expected alignment. Small avulsion fragments are noted volar to the proximal interphalangeal joint. No new fractures are seen. Soft tissue swelling is noted about the fifth digit.  IMPRESSION: Successful reduction of fifth finger dislocations. Small avulsion fragments again noted volar to the proximal interphalangeal joint. No new fractures seen.   Electronically Signed   By: Roanna RaiderJeffery  Chang M.D.   On: 12/13/2013 03:19   Dg Finger Little Left  12/13/2013   CLINICAL DATA:  Postreduction  EXAM: LEFT LITTLE FINGER 2+V  COMPARISON:  Plain film 12/13/2013 at 0034 hr  FINDINGS: There is persistent subluxation of the proximal interphalangeal joint of the fifth digit by and 1/2 bone with. There is persistent subluxation dorsally by a full bone with seen on lateral projection. There is a avulsion fracture fragment adjacent to the proximal interphalangeal joint. The distal interphalangeal joint is located.  IMPRESSION:  Persistent dislocation of the proximal interphalangeal joint. Small avulsion fracture.  The distal interphalangeal joint is located.   Electronically Signed   By: Genevive BiStewart  Edmunds M.D.   On: 12/13/2013 02:26     Enid SkeensJoshua M Tehilla Coffel, MD 12/28/13 [phone]

## 2013-12-28 NOTE — ED Notes (Signed)
Pt. Stated, I fell 2 weeks ago and last night I sneezed and it felt like my whole left side just budged out. It feels like it affected everything. My knee is all messed up. [facility]'ve tried to [facility] and its almos impossible.

## 2013-12-28 NOTE — ED Notes (Signed)
Patient transported to CT 

## 2013-12-28 NOTE — Discharge Instructions (Signed)
If you were given medicines take as directed.  If you are on coumadin or contraceptives realize their levels and effectiveness is altered by many different medicines.  If you have any reaction (rash, tongues swelling, other) to the medicines stop taking and see a physician.   Please follow up as directed and return to the ER or see a physician for new or worsening symptoms.  Thank you. Filed Vitals:   12/28/13 1200 12/28/13 1249 12/28/13 1251 12/28/13 1300  BP: 136/85 119/52 119/52 117/52  Pulse: 55 63 47 50  Temp:   98.2 F (36.8 C)   TempSrc:   Oral   Resp: 15  12 19   SpO2: 98% 98% 99% 98%

## 2014-01-13 ENCOUNTER — Ambulatory Visit: Payer: Self-pay | Admitting: Podiatry

## 2014-01-27 ENCOUNTER — Encounter: Payer: Self-pay | Admitting: Podiatry

## 2014-01-27 ENCOUNTER — Ambulatory Visit (INDEPENDENT_AMBULATORY_CARE_PROVIDER_SITE_OTHER): Payer: Non-veteran care | Admitting: Podiatry

## 2014-01-27 VITALS — BP 136/84 | HR 67 | Resp 16

## 2014-01-27 DIAGNOSIS — B351 Tinea unguium: Secondary | ICD-10-CM

## 2014-01-27 MED ORDER — TERBINAFINE HCL 250 MG PO TABS: 250.0000 mg | ORAL_TABLET | Freq: Every day | ORAL | Status: DC

## 2014-01-27 NOTE — Progress Notes (Signed)
He presents today for followup of his fungal culture which did come back positive.  Objective: Vital signs are stable he is alert and oriented x3. Has recently had blood work done which demonstrates normal liver profile. Otherwise his current fungal results are positive.  Assessment: Onychomycosis hallux.  Plan: Discussed etiology pathology conservative versus surgical therapies. We will ahead started him on Lamisil 250 mg tablets 30 in number one by mouth daily and I will followup with him in one month for a liver profile and CBC. At which time 5290

## 2014-01-28 ENCOUNTER — Encounter (INDEPENDENT_AMBULATORY_CARE_PROVIDER_SITE_OTHER): Payer: Self-pay

## 2014-01-28 ENCOUNTER — Encounter: Payer: Self-pay | Admitting: Internal Medicine

## 2014-01-28 ENCOUNTER — Ambulatory Visit (INDEPENDENT_AMBULATORY_CARE_PROVIDER_SITE_OTHER): Payer: Non-veteran care | Admitting: Internal Medicine

## 2014-01-28 ENCOUNTER — Other Ambulatory Visit (INDEPENDENT_AMBULATORY_CARE_PROVIDER_SITE_OTHER): Payer: Non-veteran care

## 2014-01-28 ENCOUNTER — Ambulatory Visit (INDEPENDENT_AMBULATORY_CARE_PROVIDER_SITE_OTHER)
Admission: RE | Admit: 2014-01-28 | Discharge: 2014-01-28 | Disposition: A | Discharge status: Home / Self Care | Payer: Non-veteran care | Source: Ambulatory Visit | Attending: Internal Medicine | Admitting: Internal Medicine

## 2014-01-28 ENCOUNTER — Institutional Professional Consult (permissible substitution): Payer: Non-veteran care | Admitting: Internal Medicine

## 2014-01-28 VITALS — BP 134/80 | HR 67 | Temp 98.7°F | Ht 70.0 in | Wt 224.0 lb

## 2014-01-28 DIAGNOSIS — R071 Chest pain on breathing: Secondary | ICD-10-CM

## 2014-01-28 DIAGNOSIS — G459 Transient cerebral ischemic attack, unspecified: Secondary | ICD-10-CM

## 2014-01-28 DIAGNOSIS — R05 Cough: Secondary | ICD-10-CM

## 2014-01-28 DIAGNOSIS — R0789 Other chest pain: Secondary | ICD-10-CM

## 2014-01-28 DIAGNOSIS — R059 Cough, unspecified: Secondary | ICD-10-CM

## 2014-01-28 DIAGNOSIS — R058 Other specified cough: Secondary | ICD-10-CM | POA: Insufficient documentation

## 2014-01-28 LAB — CBC WITH DIFFERENTIAL/PLATELET
BASOS PCT: 0.4 % (ref 0.0–3.0)
Basophils Absolute: 0 10*3/uL (ref 0.0–0.1)
Eosinophils Absolute: 0.1 10*3/uL (ref 0.0–0.7)
Eosinophils Relative: 1.4 % (ref 0.0–5.0)
HCT: 37.3 % — ABNORMAL LOW (ref 39.0–52.0)
HEMOGLOBIN: 13.1 g/dL (ref 13.0–17.0)
LYMPHS PCT: 29 % (ref 12.0–46.0)
Lymphs Abs: 1.9 10*3/uL (ref 0.7–4.0)
MCHC: 35.1 g/dL (ref 30.0–36.0)
MCV: 89.9 fl (ref 78.0–100.0)
MONOS PCT: 8.3 % (ref 3.0–12.0)
Monocytes Absolute: 0.5 10*3/uL (ref 0.1–1.0)
Neutro Abs: 4 10*3/uL (ref 1.4–7.7)
Neutrophils Relative %: 60.9 % (ref 43.0–77.0)
Platelets: 194 10*3/uL (ref 150.0–400.0)
RBC: 4.14 Mil/uL — ABNORMAL LOW (ref 4.22–5.81)
RDW: 12.8 % (ref 11.5–15.5)
WBC: 6.6 10*3/uL (ref 4.0–10.5)

## 2014-01-28 LAB — SEDIMENTATION RATE: Sed Rate: 19 mm/hr (ref 0–22)

## 2014-01-28 MED ORDER — OXYCODONE-ACETAMINOPHEN 5-325 MG PO TABS: 1.0000 | ORAL_TABLET | ORAL | Status: DC | PRN

## 2014-01-28 NOTE — Progress Notes (Signed)
Subjective:    Patient ID: [name] [name], male    DOB: 1945/10/18  MRN: [id number]  HPI  64 yowm quit smoking 1989 and did fine until admit flu Dec 2014 totally recovered and felt fine s need for any resp rx or limitations but fell   December 13 2013 stumbled in his shop striking L chest > [place] dx with rib bruise, no obvious fx > referred by [facility] for local eval to pulmonary clinic 01/28/2014    01/28/2014 1st [facility] office visit/ [name] [name]  Chief Complaint  Patient presents with  . Pulmonary Consult    Referred by [facility]. Pt states that he was seen here in the past for pneumonia. He states that he had a fall a few wks ago and is having pain in rib area on the left. He is having a hard time breathing due to the pain. He c/o cough for the past month- prod with minimal clear to pink tinged sputum.    pain is  immediately worse lying supine better on R side.  Some sneezing/ cough related to pnds   No obvious other patterns in day to day or daytime variabilty or assoc limiting sob  or cp or chest tightness, subjective wheeze overt sinus or hb symptoms. No unusual exp hx or h/o childhood pna/ asthma or knowledge of premature birth.  Sleeping ok without nocturnal  or early am exacerbation  of respiratory  c/o's or need for noct saba. Also denies any obvious fluctuation of symptoms with weather or environmental changes or other aggravating or alleviating factors except as outlined above   Current Medications, Allergies, Complete Past Medical History, Past Surgical History, Family History, and Social History were reviewed in [employer] record.              Review of Systems  Constitutional: Negative for fever, chills, activity change, appetite change and unexpected weight change.  HENT: Negative for congestion, dental problem, postnasal drip, rhinorrhea, sneezing, sore throat, trouble swallowing and voice change.   Eyes: Negative for  visual disturbance.  Respiratory: Positive for cough. Negative for choking and shortness of breath.   Cardiovascular: Negative for chest pain and leg swelling.  Gastrointestinal: Negative for nausea, vomiting and abdominal pain.  Genitourinary: Negative for difficulty urinating.  Musculoskeletal: Negative for arthralgias.  Skin: Negative for rash.  Psychiatric/Behavioral: Negative for behavioral problems and confusion.       Objective:   Physical Exam  amb obese wm nad  Wt Readings from Last 3 Encounters:  01/28/14 224 lb (101.606 kg)  07/25/13 220 lb (99.791 kg)  06/24/13 220 lb (99.791 kg)     HEENT: nl dentition, turbinates, and orophanx. Nl external ear canals without cough reflex   NECK :  without JVD/Nodes/TM/ nl carotid upstrokes bilaterally   LUNGS: Pos tender along L lateral cw diffusely s bruise no acc muscle use, clear to A and P bilaterally without cough on insp or exp maneuvers   CV:  RRR  no s3 or murmur or increase in P2, no edema   ABD:  soft and nontender with nl excursion in the supine position. No bruits or organomegaly, bowel sounds nl  MS:  warm without deformities, calf tenderness, cyanosis or clubbing  SKIN: warm and dry without lesions    NEURO:  alert, approp, no deficits     CXR  01/28/2014 :  1. Cannot exclude a nondisplaced sternal fracture with possible  small retrosternal hematoma.  Consider CT of the chest for further  assessment.  2. No active infiltrate or effusion.  3. Mild cardiomegaly. .      Assessment & Plan:

## 2014-01-28 NOTE — Progress Notes (Signed)
Quick Note:  Spoke with pt and notified of results per Dr. Wert. Pt verbalized understanding and denied any questions.  ______ 

## 2014-01-28 NOTE — Assessment & Plan Note (Signed)
The most common causes of chronic cough in immunocompetent adults include the following: upper airway cough syndrome (UACS), previously referred to as postnasal drip syndrome (PNDS), which is caused by variety of rhinosinus conditions; (2) asthma; (3) GERD; (4) chronic bronchitis from cigarette smoking or other inhaled environmental irritants; (5) nonasthmatic eosinophilic bronchitis; and (6) bronchiectasis.   These conditions, singly or in combination, have accounted for up to 94% of the causes of chronic cough in prospective studies.   Other conditions have constituted no >6% of the causes in prospective studies These have included bronchogenic carcinoma, chronic interstitial pneumonia, sarcoidosis, left ventricular failure, ACEI-induced cough, and aspiration from a condition associated with pharyngeal dysfunction.    Chronic cough is often simultaneously caused by more than one condition. A single cause has been found from 38 to 82% of the time, multiple causes from 18 to 62%. Multiply caused cough has been the result of three diseases up to 42% of the time.       Based on hx and exam, this is most likely:  Classic Upper airway cough syndrome, so named because it's frequently impossible to sort out how much is  CR/sinusitis with freq throat clearing (which can be related to primary GERD)   vs  causing  secondary (" extra esophageal")  GERD from wide swings in gastric pressure that occur with throat clearing, often  promoting self use of mint and menthol lozenges that reduce the lower esophageal sphincter tone and exacerbate the problem further in a cyclical fashion.   These are the same pts (now being labeled as having "irritable larynx syndrome" by some cough centers) who not infrequently have a history of having failed to tolerate ace inhibitors,  dry powder inhalers or biphosphonates or report having atypical reflux symptoms that don't respond to standard doses of PPI , and are easily confused as  having aecopd or asthma flares by even experienced allergists/ pulmonologists.   The first step is to   eliminate cyclical coughing with percocet and pnds with 1st gen H1 then regroup if the cough persists.  See instructions for specific recommendations which were reviewed directly with the patient who was given a copy with highlighter outlining the key components.

## 2014-01-28 NOTE — Patient Instructions (Addendum)
Percocet 1 every 4 hours as needed for cough or pain, works for both  Zostrix cream apply to Left chest wall ,  start with 4 x daily and taper to just bedtime as it kicks in   For drainage take chlortrimeton (chlorpheniramine) 4 mg every 4 hours available over the counter (may cause drowsiness)   Please remember to go to the lab and x-ray department downstairs for your tests - we will call you with the results when they are available.

## 2014-01-28 NOTE — Assessment & Plan Note (Signed)
No evidence of hematoma or rib fx but his sneeze/ pnds related cough appears to be keeping his cw from healing or he's developing a neuralgia from the original chest wall injury  rec trial of zostrix and prn percoet Control sneeze/ cough with 1st gen H1

## 2014-02-24 ENCOUNTER — Ambulatory Visit: Payer: Non-veteran care | Admitting: Podiatry

## 2014-03-31 ENCOUNTER — Ambulatory Visit: Payer: Non-veteran care | Admitting: Podiatry

## 2014-04-13 ENCOUNTER — Other Ambulatory Visit: Payer: Self-pay | Admitting: Family Medicine

## 2014-04-13 DIAGNOSIS — M545 Low back pain, unspecified: Secondary | ICD-10-CM

## 2014-04-13 DIAGNOSIS — G8929 Other chronic pain: Secondary | ICD-10-CM

## 2014-04-15 ENCOUNTER — Ambulatory Visit
Admission: RE | Admit: 2014-04-15 | Discharge: 2014-04-15 | Disposition: A | Discharge status: Home / Self Care | Payer: Non-veteran care | Source: Ambulatory Visit | Attending: Family Medicine | Admitting: Family Medicine

## 2014-04-15 DIAGNOSIS — M545 Low back pain, unspecified: Secondary | ICD-10-CM

## 2014-04-15 DIAGNOSIS — G8929 Other chronic pain: Secondary | ICD-10-CM

## 2014-04-15 MED ORDER — IOHEXOL 180 MG/ML  SOLN
1.0000 mL | Freq: Once | INTRAMUSCULAR | Status: AC | PRN
  Administered 2014-04-15: 1 mL via EPIDURAL

## 2014-04-15 MED ORDER — METHYLPREDNISOLONE ACETATE 40 MG/ML INJ SUSP (RADIOLOG
120.0000 mg | Freq: Once | INTRAMUSCULAR | Status: AC
  Administered 2014-04-15: 120 mg via EPIDURAL

## 2014-04-15 NOTE — Discharge Instructions (Signed)

## 2014-05-26 ENCOUNTER — Other Ambulatory Visit: Payer: Self-pay | Admitting: Family Medicine

## 2014-05-26 DIAGNOSIS — M545 Low back pain, unspecified: Secondary | ICD-10-CM

## 2014-05-26 DIAGNOSIS — G8929 Other chronic pain: Secondary | ICD-10-CM

## 2014-05-27 ENCOUNTER — Ambulatory Visit
Admission: RE | Admit: 2014-05-27 | Discharge: 2014-05-27 | Disposition: A | Discharge status: Home / Self Care | Payer: Non-veteran care | Source: Ambulatory Visit | Attending: Family Medicine | Admitting: Family Medicine

## 2014-05-27 DIAGNOSIS — R079 Chest pain, unspecified: Secondary | ICD-10-CM | POA: Insufficient documentation

## 2014-05-27 DIAGNOSIS — M545 Low back pain, unspecified: Secondary | ICD-10-CM

## 2014-05-27 DIAGNOSIS — G8929 Other chronic pain: Secondary | ICD-10-CM

## 2014-05-27 MED ORDER — IOHEXOL 180 MG/ML  SOLN
1.0000 mL | Freq: Once | INTRAMUSCULAR | Status: AC | PRN
  Administered 2014-05-27: 1 mL via EPIDURAL

## 2014-05-27 MED ORDER — METHYLPREDNISOLONE ACETATE 40 MG/ML INJ SUSP (RADIOLOG
120.0000 mg | Freq: Once | INTRAMUSCULAR | Status: AC
  Administered 2014-05-27: 120 mg via EPIDURAL

## 2014-05-27 NOTE — Discharge Instructions (Signed)

## 2014-07-27 ENCOUNTER — Other Ambulatory Visit: Payer: Self-pay | Admitting: Family Medicine

## 2014-07-27 DIAGNOSIS — M545 Low back pain: Principal | ICD-10-CM

## 2014-07-27 DIAGNOSIS — G8929 Other chronic pain: Secondary | ICD-10-CM

## 2014-07-28 ENCOUNTER — Ambulatory Visit
Admission: RE | Admit: 2014-07-28 | Discharge: 2014-07-28 | Disposition: A | Discharge status: Home / Self Care | Payer: Non-veteran care | Source: Ambulatory Visit | Attending: Family Medicine | Admitting: Family Medicine

## 2014-07-28 DIAGNOSIS — G8929 Other chronic pain: Secondary | ICD-10-CM

## 2014-07-28 DIAGNOSIS — M545 Low back pain, unspecified: Secondary | ICD-10-CM

## 2014-07-28 MED ORDER — METHYLPREDNISOLONE ACETATE 40 MG/ML INJ SUSP (RADIOLOG
120.0000 mg | Freq: Once | INTRAMUSCULAR | Status: AC
  Administered 2014-07-28: 120 mg via EPIDURAL

## 2014-07-28 MED ORDER — IOHEXOL 180 MG/ML  SOLN
1.0000 mL | Freq: Once | INTRAMUSCULAR | Status: AC | PRN
  Administered 2014-07-28: 1 mL via EPIDURAL

## 2014-08-04 ENCOUNTER — Ambulatory Visit: Payer: Non-veteran care | Admitting: Physical Therapy

## 2014-09-05 ENCOUNTER — Emergency Department (HOSPITAL_COMMUNITY): Payer: Non-veteran care

## 2014-09-05 ENCOUNTER — Encounter (HOSPITAL_COMMUNITY): Payer: Self-pay | Admitting: Family Medicine

## 2014-09-05 ENCOUNTER — Emergency Department (HOSPITAL_COMMUNITY)
Admission: EM | Admit: 2014-09-05 | Discharge: 2014-09-05 | Disposition: A | Discharge status: Home / Self Care | Payer: Non-veteran care | Attending: Emergency Medicine | Admitting: Emergency Medicine

## 2014-09-05 DIAGNOSIS — Z79899 Other long term (current) drug therapy: Secondary | ICD-10-CM | POA: Insufficient documentation

## 2014-09-05 DIAGNOSIS — Y9389 Activity, other specified: Secondary | ICD-10-CM | POA: Diagnosis not present

## 2014-09-05 DIAGNOSIS — Z8719 Personal history of other diseases of the digestive system: Secondary | ICD-10-CM | POA: Diagnosis not present

## 2014-09-05 DIAGNOSIS — W1839XA Other fall on same level, initial encounter: Secondary | ICD-10-CM | POA: Diagnosis not present

## 2014-09-05 DIAGNOSIS — S299XXA Unspecified injury of thorax, initial encounter: Secondary | ICD-10-CM | POA: Insufficient documentation

## 2014-09-05 DIAGNOSIS — I2510 Atherosclerotic heart disease of native coronary artery without angina pectoris: Secondary | ICD-10-CM | POA: Diagnosis not present

## 2014-09-05 DIAGNOSIS — Z9889 Other specified postprocedural states: Secondary | ICD-10-CM | POA: Diagnosis not present

## 2014-09-05 DIAGNOSIS — Z87891 Personal history of nicotine dependence: Secondary | ICD-10-CM | POA: Insufficient documentation

## 2014-09-05 DIAGNOSIS — E780 Pure hypercholesterolemia: Secondary | ICD-10-CM | POA: Diagnosis not present

## 2014-09-05 DIAGNOSIS — Z8669 Personal history of other diseases of the nervous system and sense organs: Secondary | ICD-10-CM | POA: Insufficient documentation

## 2014-09-05 DIAGNOSIS — Z7902 Long term (current) use of antithrombotics/antiplatelets: Secondary | ICD-10-CM | POA: Diagnosis not present

## 2014-09-05 DIAGNOSIS — S39012A Strain of muscle, fascia and tendon of lower back, initial encounter: Secondary | ICD-10-CM | POA: Insufficient documentation

## 2014-09-05 DIAGNOSIS — S3992XA Unspecified injury of lower back, initial encounter: Secondary | ICD-10-CM | POA: Diagnosis present

## 2014-09-05 DIAGNOSIS — I252 Old myocardial infarction: Secondary | ICD-10-CM | POA: Insufficient documentation

## 2014-09-05 DIAGNOSIS — F419 Anxiety disorder, unspecified: Secondary | ICD-10-CM | POA: Diagnosis not present

## 2014-09-05 DIAGNOSIS — S29092A Other injury of muscle and tendon of back wall of thorax, initial encounter: Secondary | ICD-10-CM | POA: Diagnosis not present

## 2014-09-05 DIAGNOSIS — Y998 Other external cause status: Secondary | ICD-10-CM | POA: Insufficient documentation

## 2014-09-05 DIAGNOSIS — Z8701 Personal history of pneumonia (recurrent): Secondary | ICD-10-CM | POA: Diagnosis not present

## 2014-09-05 DIAGNOSIS — Y9289 Other specified places as the place of occurrence of the external cause: Secondary | ICD-10-CM | POA: Diagnosis not present

## 2014-09-05 DIAGNOSIS — I10 Essential (primary) hypertension: Secondary | ICD-10-CM | POA: Diagnosis not present

## 2014-09-05 DIAGNOSIS — Z9861 Coronary angioplasty status: Secondary | ICD-10-CM | POA: Insufficient documentation

## 2014-09-05 DIAGNOSIS — W19XXXA Unspecified fall, initial encounter: Secondary | ICD-10-CM

## 2014-09-05 MED ORDER — FENTANYL CITRATE 0.05 MG/ML IJ SOLN
25.0000 ug | Freq: Once | INTRAMUSCULAR | Status: AC
  Administered 2014-09-05: 25 ug via INTRAVENOUS
  Filled 2014-09-05: qty 2

## 2014-09-05 MED ORDER — OXYCODONE-ACETAMINOPHEN 5-325 MG PO TABS: 1.0000 | ORAL_TABLET | Freq: Four times a day (QID) | ORAL | Status: DC | PRN

## 2014-09-05 MED ORDER — OXYCODONE-ACETAMINOPHEN 5-325 MG PO TABS
2.0000 | ORAL_TABLET | Freq: Once | ORAL | Status: AC
  Administered 2014-09-05: 2 via ORAL
  Filled 2014-09-05: qty 2

## 2014-09-05 MED ORDER — FENTANYL CITRATE 0.05 MG/ML IJ SOLN
100.0000 ug | Freq: Once | INTRAMUSCULAR | Status: AC
  Administered 2014-09-05: 100 ug via INTRAVENOUS
  Filled 2014-09-05: qty 2

## 2014-09-05 MED ORDER — KETOROLAC TROMETHAMINE 30 MG/ML IJ SOLN
15.0000 mg | Freq: Once | INTRAMUSCULAR | Status: AC
  Administered 2014-09-05: 15 mg via INTRAVENOUS
  Filled 2014-09-05: qty 1

## 2014-09-05 NOTE — ED Notes (Signed)
MD notified patient requesting pain medication.

## 2014-09-05 NOTE — ED Notes (Signed)
Pt here for severe back pain. sts started after unloading bags of mulch yesterday. sts hurts from neck down to legs. sts the bag fell on chest and he fell backwards.

## 2014-09-05 NOTE — Discharge Instructions (Signed)

## 2014-09-05 NOTE — ED Provider Notes (Signed)
CSN: [id number]     Arrival date & time 09/05/14  1124 History   First MD Initiated Contact with Patient 09/05/14 1149     Chief Complaint  Patient presents with  . Back Pain     (Consider location/radiation/quality/duration/timing/severity/associated sxs/prior Treatment) Patient is a 69 y.o. male presenting with back pain. The history is provided by the patient.  Back Pain Associated symptoms: no abdominal pain, no chest pain, no headaches, no numbness and no weakness    patient states that he was moving bags of mulch yesterday lifted 1 up and he fell backwards with the bag landing on his chest and him landing on his back. States she's had pain from his neck down to his back and radiating to his leg since then. Has a history of some chronic lower back pain. It's usually does not have pain in his mid back or neck. No numbness or weakness. States his legs do feel heavy. No difficulty breathing.  Past Medical History  Diagnosis Date  . Coronary artery disease   . Pneumonia   . Anxiety   . Depression   . Hypertension   . Hypercholesteremia   . Myocardial infarction 2010ish  . PTSD (post-traumatic stress disorder)   . Sleep apnea      doesnt use CPAP  . Stroke     slight weakness left- 85- 90 % returned  . Carpal tunnel syndrome   . Hepatitis    Past Surgical History  Procedure Laterality Date  . Cardiac catheterization    . Coronary angioplasty    . Stents  2012  . Eye surgery Bilateral     cataract  . Knee arthroscopy Right   . Carpal tunnel release Right 06/10/2013    Procedure: RIGHT CARPAL TUNNEL RELEASE;  Surgeon: Kathryne Hitch, MD;  Location: [facility] OR;  Service: Orthopedics;  Laterality: Right;   Family History  Problem Relation Age of Onset  . Heart disease Mother     Several other maternal family members  . Lung cancer Father     small cell CA/smoked   History  Substance Use Topics  . Smoking status: Former Smoker -- 2.00 packs/day for 20 years     Types: Cigarettes    Quit date: 11/21/1987  . Smokeless tobacco: Never Used  . Alcohol Use: No    Review of Systems  Constitutional: Negative for activity change and appetite change.  Eyes: Negative for pain.  Respiratory: Negative for chest tightness and shortness of breath.   Cardiovascular: Negative for chest pain and leg swelling.  Gastrointestinal: Negative for nausea, vomiting, abdominal pain and diarrhea.  Genitourinary: Negative for flank pain.  Musculoskeletal: Positive for back pain and neck pain. Negative for neck stiffness.  Skin: Negative for rash.  Neurological: Negative for weakness, numbness and headaches.  Psychiatric/Behavioral: Negative for behavioral problems.      Allergies  Dilaudid and Clindamycin/lincomycin  Home Medications   Prior to Admission medications   Medication Sig Start Date End Date Taking? Authorizing Provider  atorvastatin (LIPITOR) 80 MG tablet Take 40 mg by mouth daily.   Yes Historical Provider, MD  clonazePAM (KLONOPIN) 1 MG tablet Take 1 mg by mouth at bedtime.    Yes Historical Provider, MD  clopidogrel (PLAVIX) 75 MG tablet Take 75 mg by mouth daily.     Yes Historical Provider, MD  metoprolol succinate (TOPROL-XL) 50 MG 24 hr tablet Take 25 mg by mouth daily. Take with or immediately following a meal.   Yes Historical  Provider, MD  tiZANidine (ZANAFLEX) 4 MG capsule Take 12 mg by mouth at bedtime.   Yes Historical Provider, MD  oxyCODONE-acetaminophen (ROXICET) 5-325 MG per tablet Take 1-2 tablets by mouth every 6 (six) hours as needed for severe pain. 09/05/14   Benjiman CoreNathan Rozalynn Buege, MD   BP 108/63 mmHg  Pulse 59  Temp(Src) 97.6 F (36.4 C) (Oral)  Resp 16  SpO2 98% Physical Exam  Constitutional: He appears well-developed and well-nourished.  Patient appears uncomfortable  HENT:  Head: Atraumatic.  Neck:  Mild tenderness over lower cervical spine. No step-off or deformity.  Cardiovascular: Normal rate and regular rhythm.    Pulmonary/Chest: Effort normal.  Abdominal: Bowel sounds are normal.  Musculoskeletal:  Some tenderness over thoracic and lumbar spine. Appears be tender over most of the spine. Sensation grossly intact over both feet. Good flexion-extension and ankles. Some pain with straight leg raise bilaterally and patient's difficulty holding up his legs.  Neurological: He is alert.  Skin: Skin is warm.    ED Course  Procedures (including critical care time) Labs Review Labs Reviewed - No data to display  Imaging Review Dg Thoracic Spine 2 View  09/05/2014   CLINICAL DATA:  Larey SeatFell yesterday flat on back on ground at home on the bag of mulch fell on him, thoracic and lumbar pain  EXAM: THORACIC SPINE - 2 VIEW  COMPARISON:  Chest radiographs 01/28/2014  FINDINGS: Bones appear demineralized.  12 pairs of ribs.  T12 incompletely visualized on AP view.  Spina bifida occulta T1.  Multilevel disc space narrowing and endplate spur formation.  No acute fracture, subluxation or bone destruction.  IMPRESSION: Scattered degenerative disc disease changes thoracic spine.  No acute abnormalities.   Electronically Signed   By: Ulyses SouthwardMark  Boles M.D.   On: 09/05/2014 13:40   Dg Lumbar Spine Complete  09/05/2014   CLINICAL DATA:  Back pain  EXAM: LUMBAR SPINE - COMPLETE 4+ VIEW  COMPARISON:  12/28/2013  FINDINGS: Normal alignment of the lumbar spine. The vertebral body heights and disc spaces are well preserved. Multi level disc space narrowing and ventral endplate spurring is identified compatible with degenerative disc disease. Chronic healed left rib fracture is again noted.  IMPRESSION: 1. Lumbar degenerative disc disease. 2. No acute findings.   Electronically Signed   By: Signa Kellaylor  Stroud M.D.   On: 09/05/2014 13:41   Ct Cervical Spine Wo Contrast  09/05/2014   CLINICAL DATA:  69 year old male with history of trauma from a fall yesterday complaining of severe neck pain.  EXAM: CT CERVICAL SPINE WITHOUT CONTRAST  TECHNIQUE:  Multidetector CT imaging of the cervical spine was performed without intravenous contrast. Multiplanar CT image reconstructions were also generated.  COMPARISON:  No priors.  FINDINGS: No acute displaced fractures of the cervical spine. Incomplete fusion of the posterior elements of T1 (normal variant) incidentally noted. Alignment is anatomic. Prevertebral soft tissues are normal. Multilevel degenerative disc disease, most severe at C4-C5, C5-C6 and C6-C7. Mild multilevel facet arthropathy. Visualized portions of the lung apices demonstrate some pleuroparenchymal scarring in the lung apices bilaterally, presumably chronic post infectious or inflammatory scarring, similar to prior chest CT 12/13/2013.  IMPRESSION: 1. No evidence of significant acute traumatic injury to the cervical spine. 2. Mild multilevel degenerative disc disease and cervical spondylosis, as above.   Electronically Signed   By: Trudie Reedaniel  Entrikin M.D.   On: 09/05/2014 13:20     EKG Interpretation None      MDM   Final diagnoses:  Fall,  initial encounter  Lumbar strain, initial encounter    Patient with back pain after fall. X-rays reassuring. No frank neuro deficits. Patient feels somewhat better after treatment will be discharged home with pain medicine. Will increase his chronic pain meds for now. Will follow-up as needed.    Benjiman Core, MD 09/05/14 [phone]

## 2015-05-17 IMAGING — CR DG CHEST 2V
2 series · 2 of 2 positions shown · non-contrast
Comparison: 06/06/2011.

CLINICAL DATA: 67-year-old male preoperative study for hands
surgery. History of myocardial infarction, stroke, hypertension.
Initial encounter.

EXAM:
CHEST  2 VIEW

[w chest pa]
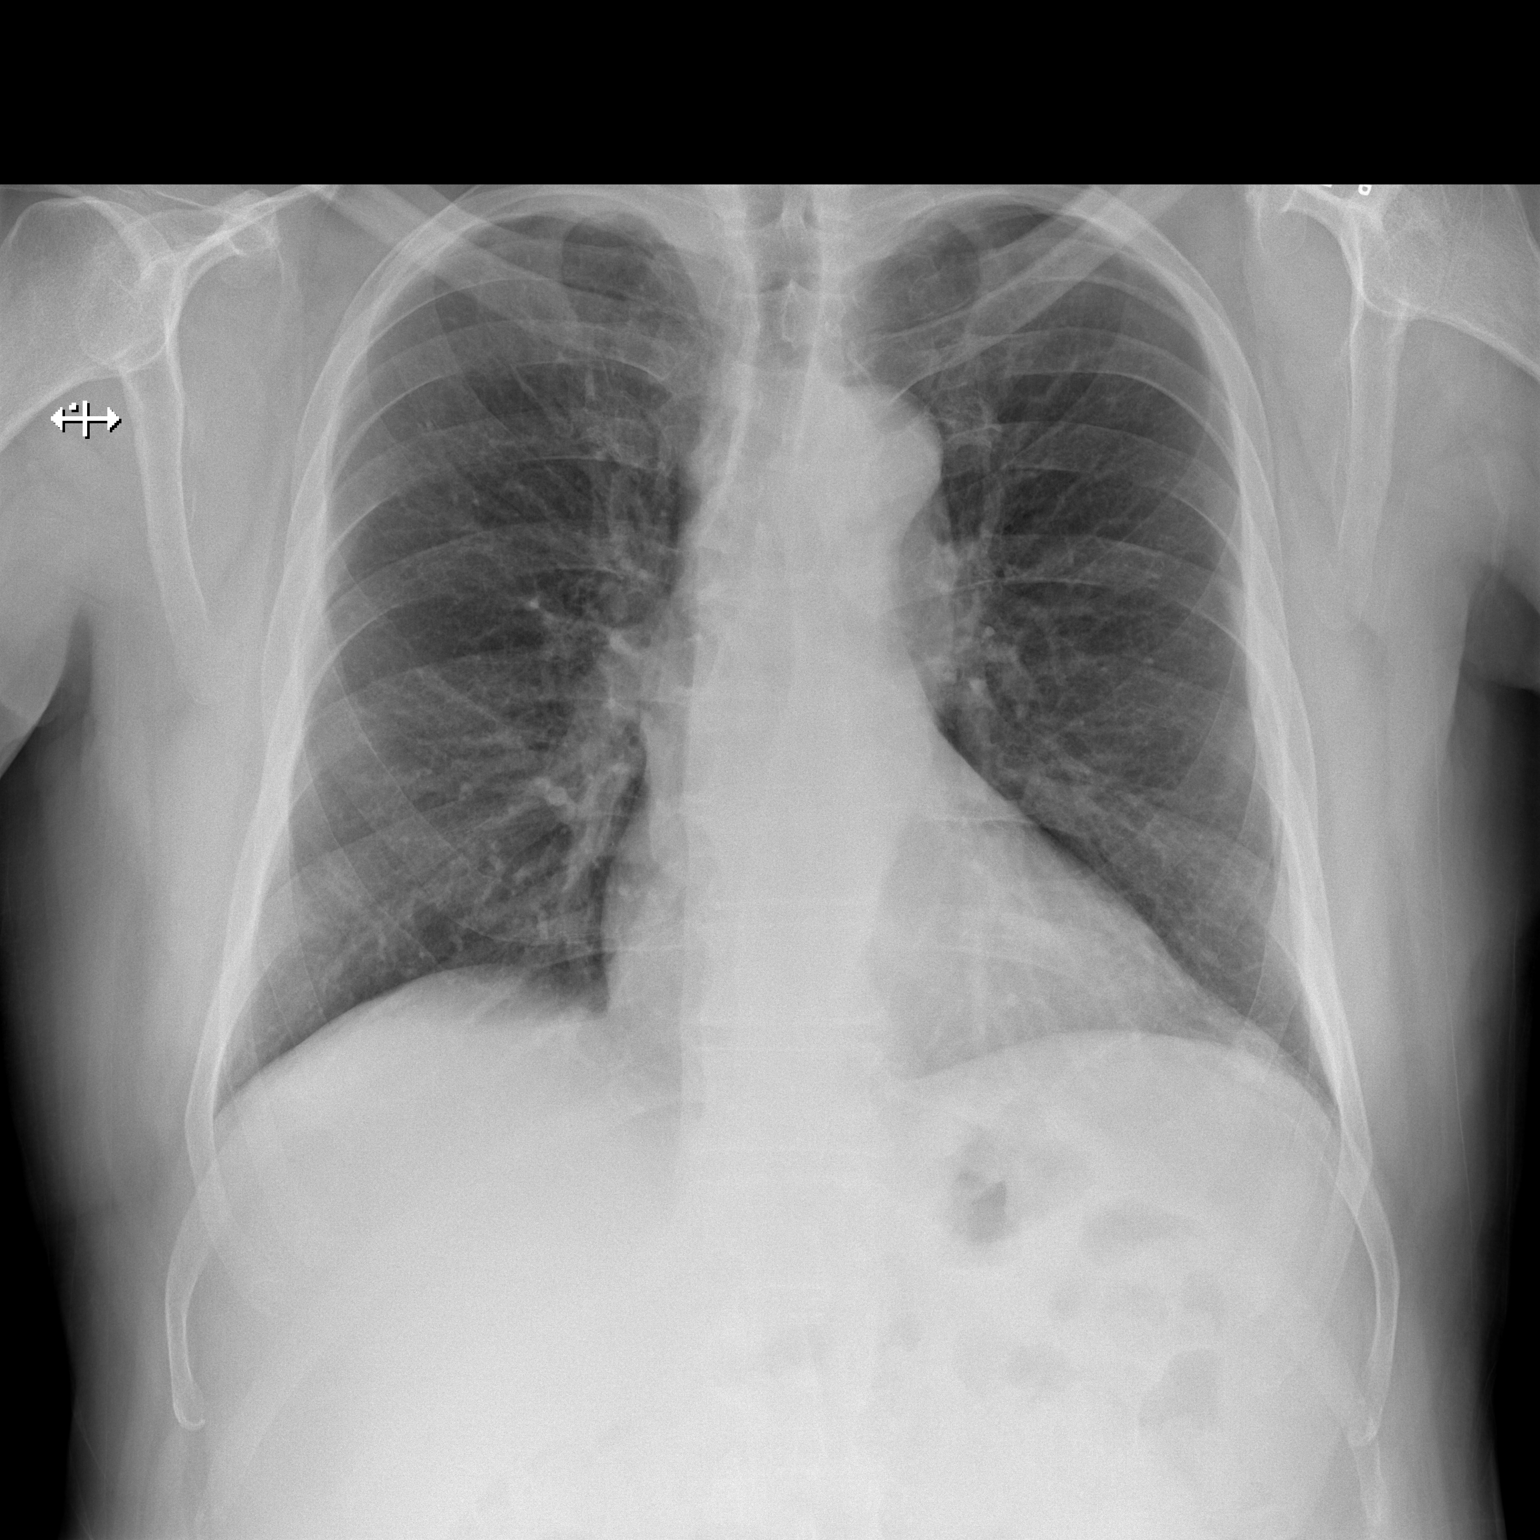

[w chest lat]
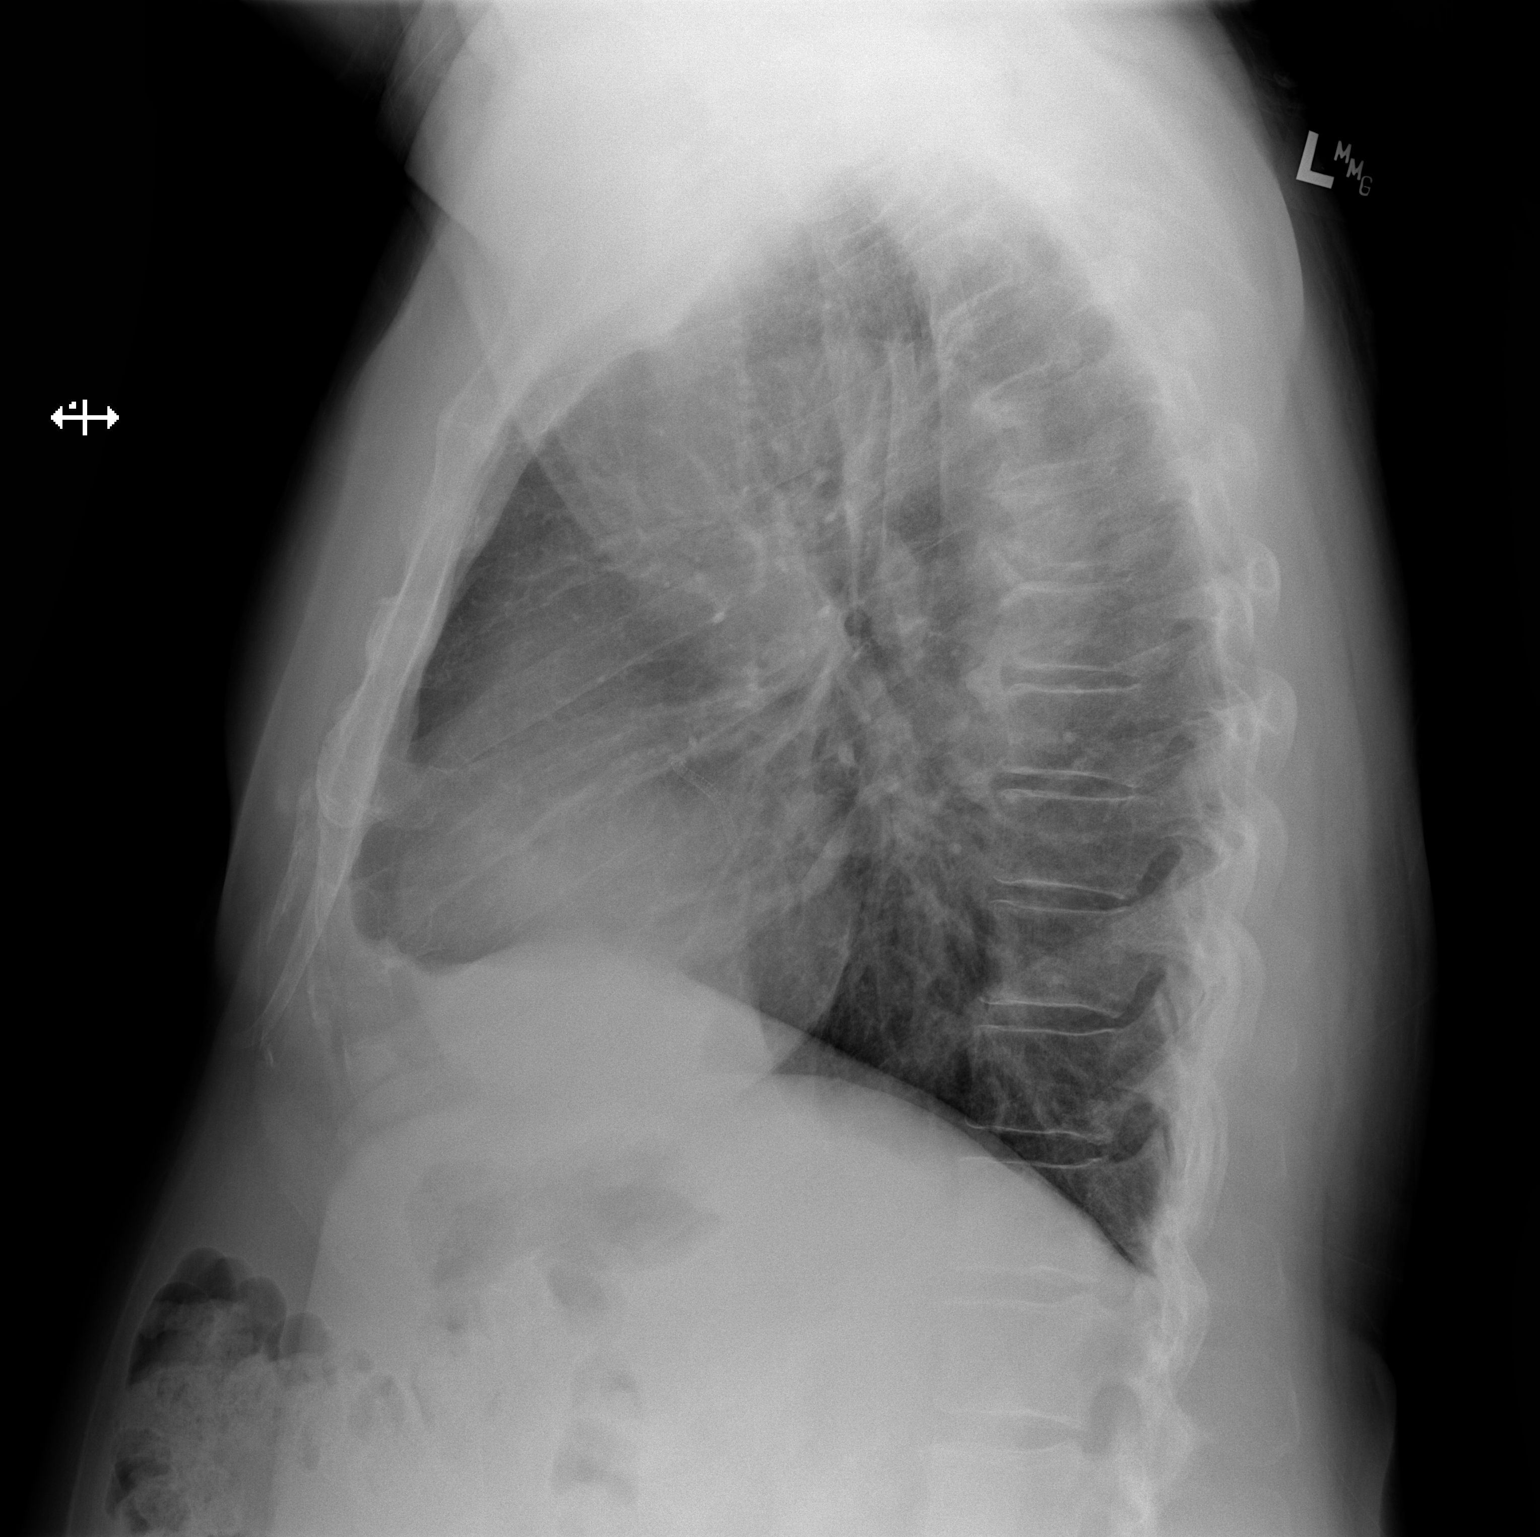

[2 of 2 positions shown; findings below may reference images not displayed]

FINDINGS: Normal lung volumes. Normal cardiac size and mediastinal contours.
Visualized tracheal air column is within normal limits. No
pneumothorax, pulmonary edema, pleural effusion or confluent
pulmonary opacity. No acute osseous abnormality identified.
IMPRESSION: No acute cardiopulmonary abnormality.

## 2015-11-19 IMAGING — CR DG FINGER LITTLE 2+V*L*
3 series · 3 of 3 positions shown · non-contrast
Comparison: Left fifth finger radiographs performed earlier today
at [DATE] a.m.

CLINICAL DATA: Status post reduction of left fifth finger
dislocation.

EXAM:
LEFT LITTLE FINGER 2+V

[x finger pa left]
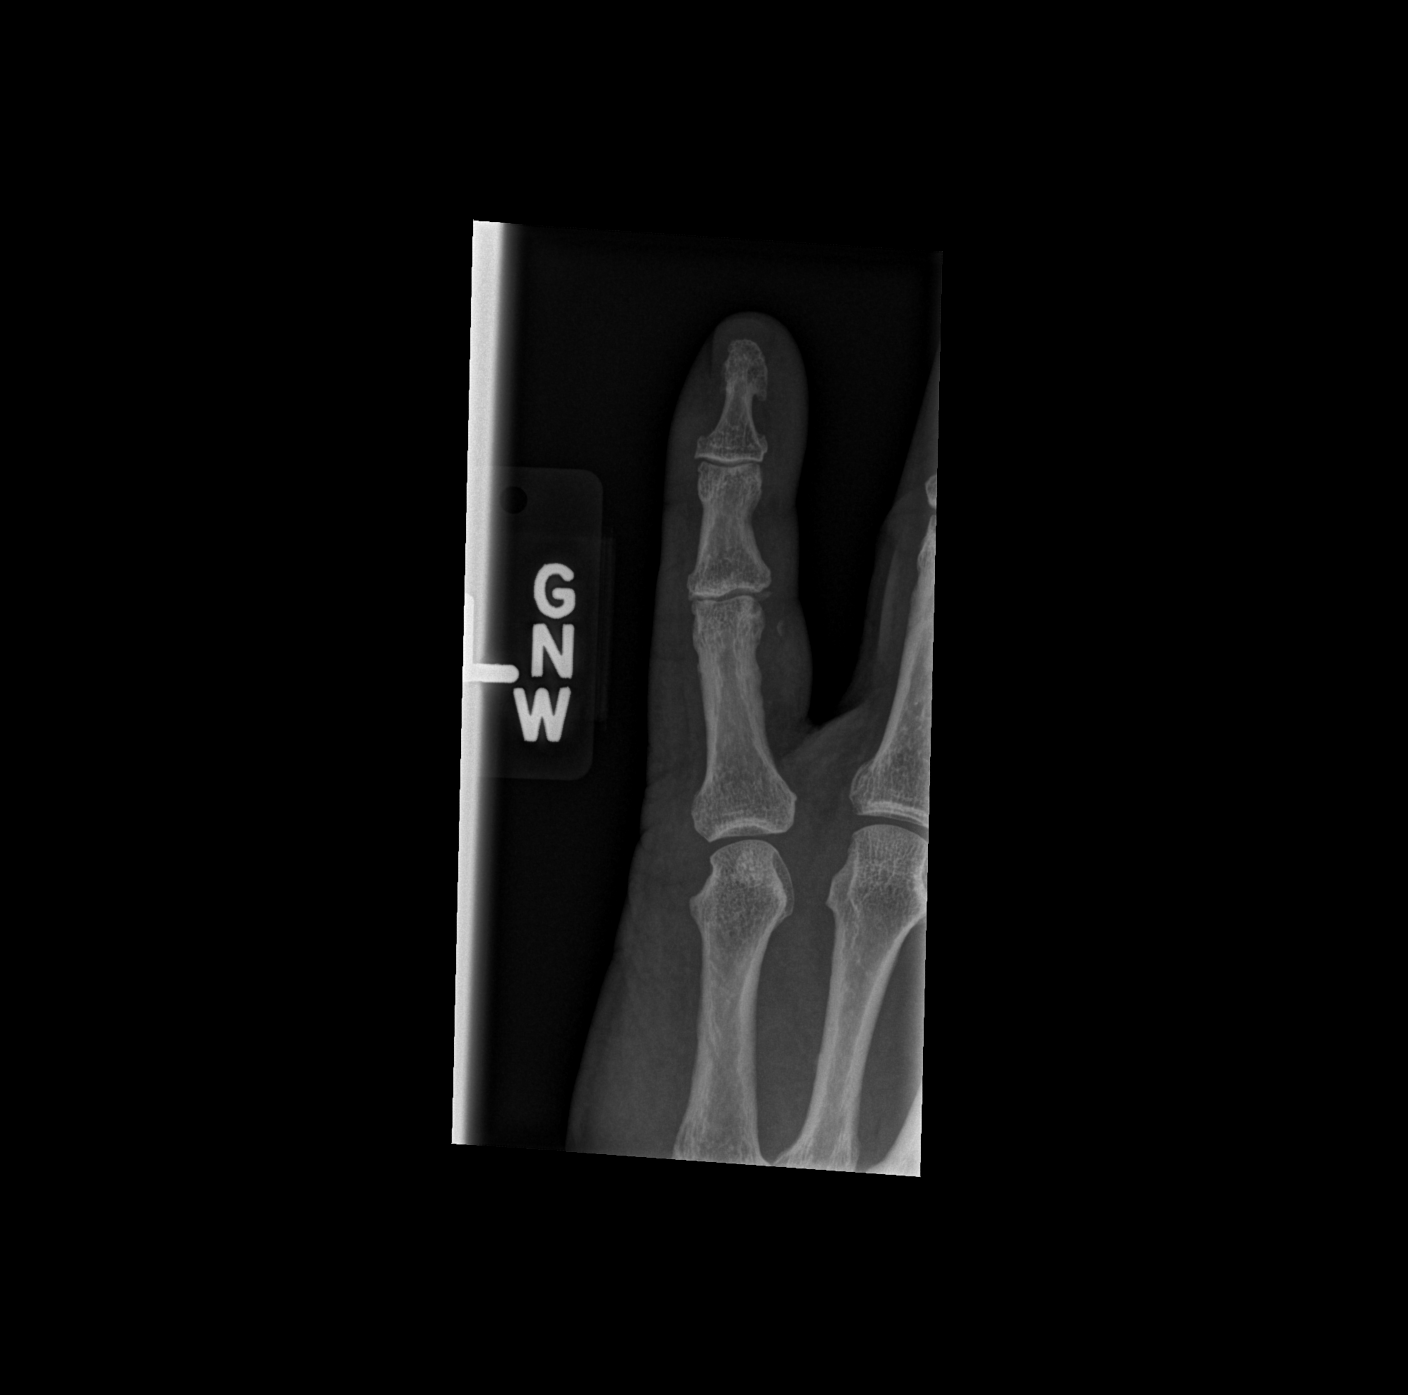

[x finger obl left]
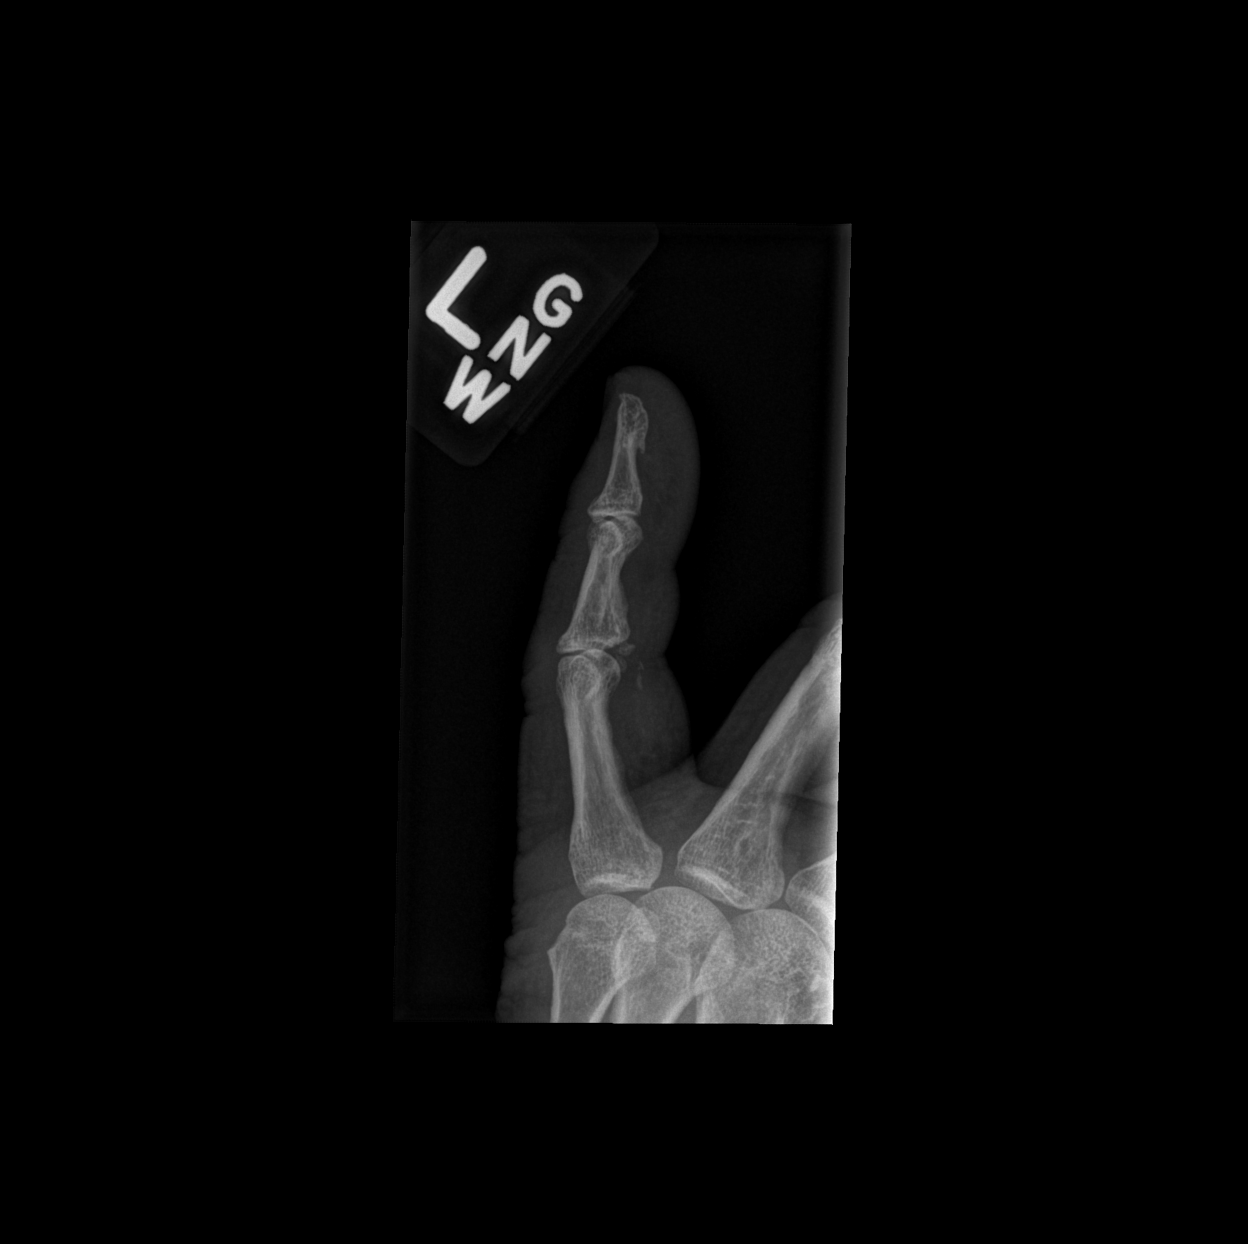

[x finger lat left]
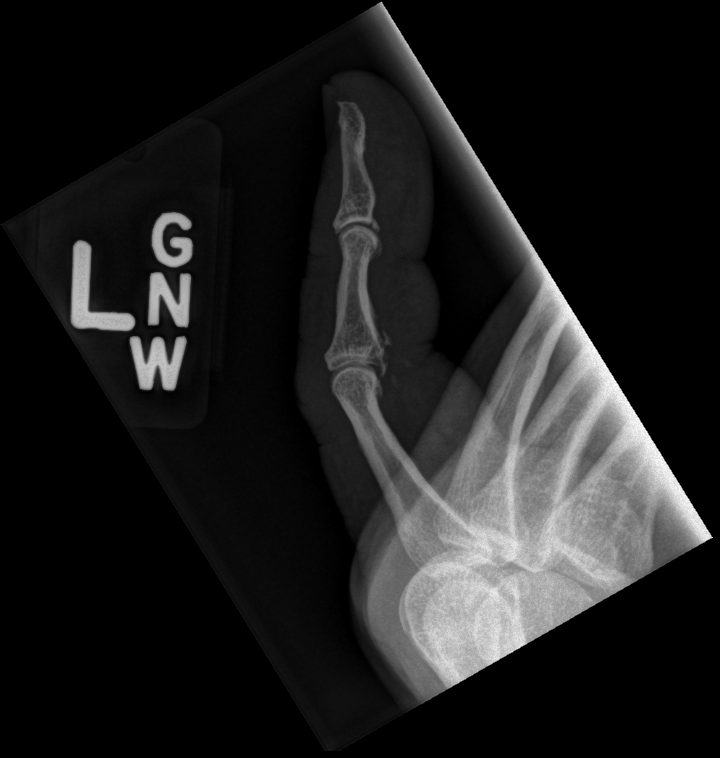

[3 of 3 positions shown; findings below may reference images not displayed]

FINDINGS: There has been successful reduction of the fifth finger
dislocations; the fifth finger is noted in expected alignment. Small
avulsion fragments are noted volar to the proximal interphalangeal
joint. No new fractures are seen. Soft tissue swelling is noted
about the fifth digit.
IMPRESSION: Successful reduction of fifth finger dislocations. Small avulsion
fragments again noted volar to the proximal interphalangeal joint.
No new fractures seen.

## 2016-02-09 ENCOUNTER — Other Ambulatory Visit: Payer: Self-pay | Admitting: Family Medicine

## 2016-02-09 DIAGNOSIS — M545 Low back pain: Secondary | ICD-10-CM

## 2016-02-19 ENCOUNTER — Ambulatory Visit
Admission: RE | Admit: 2016-02-19 | Discharge: 2016-02-19 | Disposition: A | Discharge status: Home / Self Care | Payer: Non-veteran care | Source: Ambulatory Visit | Attending: Family Medicine | Admitting: Family Medicine

## 2016-02-19 DIAGNOSIS — M545 Low back pain: Secondary | ICD-10-CM

## 2016-02-28 ENCOUNTER — Emergency Department (HOSPITAL_COMMUNITY): Payer: Non-veteran care

## 2016-02-28 ENCOUNTER — Encounter (HOSPITAL_COMMUNITY): Payer: Self-pay

## 2016-02-28 ENCOUNTER — Emergency Department (HOSPITAL_COMMUNITY)
Admission: EM | Admit: 2016-02-28 | Discharge: 2016-02-28 | Disposition: A | Discharge status: Home / Self Care | Payer: Non-veteran care | Attending: Emergency Medicine | Admitting: Emergency Medicine

## 2016-02-28 DIAGNOSIS — I10 Essential (primary) hypertension: Secondary | ICD-10-CM | POA: Insufficient documentation

## 2016-02-28 DIAGNOSIS — Z87891 Personal history of nicotine dependence: Secondary | ICD-10-CM | POA: Diagnosis not present

## 2016-02-28 DIAGNOSIS — I2510 Atherosclerotic heart disease of native coronary artery without angina pectoris: Secondary | ICD-10-CM | POA: Diagnosis not present

## 2016-02-28 DIAGNOSIS — R519 Headache, unspecified: Secondary | ICD-10-CM

## 2016-02-28 DIAGNOSIS — R51 Headache: Secondary | ICD-10-CM

## 2016-02-28 DIAGNOSIS — Z5181 Encounter for therapeutic drug level monitoring: Secondary | ICD-10-CM | POA: Insufficient documentation

## 2016-02-28 DIAGNOSIS — Z79899 Other long term (current) drug therapy: Secondary | ICD-10-CM | POA: Diagnosis not present

## 2016-02-28 DIAGNOSIS — H538 Other visual disturbances: Secondary | ICD-10-CM | POA: Diagnosis not present

## 2016-02-28 DIAGNOSIS — Z7902 Long term (current) use of antithrombotics/antiplatelets: Secondary | ICD-10-CM | POA: Diagnosis not present

## 2016-02-28 DIAGNOSIS — I252 Old myocardial infarction: Secondary | ICD-10-CM | POA: Diagnosis not present

## 2016-02-28 DIAGNOSIS — Z8673 Personal history of transient ischemic attack (TIA), and cerebral infarction without residual deficits: Secondary | ICD-10-CM | POA: Insufficient documentation

## 2016-02-28 DIAGNOSIS — Z7982 Long term (current) use of aspirin: Secondary | ICD-10-CM | POA: Diagnosis not present

## 2016-02-28 LAB — RAPID URINE DRUG SCREEN, HOSP PERFORMED
AMPHETAMINES: NOT DETECTED
BARBITURATES: NOT DETECTED
BENZODIAZEPINES: NOT DETECTED
Cocaine: NOT DETECTED
Opiates: NOT DETECTED
TETRAHYDROCANNABINOL: NOT DETECTED

## 2016-02-28 LAB — APTT: APTT: 34 s (ref 24–36)

## 2016-02-28 LAB — COMPREHENSIVE METABOLIC PANEL
ALBUMIN: 3.8 g/dL (ref 3.5–5.0)
ALT: 16 U/L — AB (ref 17–63)
AST: 15 U/L (ref 15–41)
Alkaline Phosphatase: 85 U/L (ref 38–126)
Anion gap: 6 (ref 5–15)
BUN: 11 mg/dL (ref 6–20)
CHLORIDE: 106 mmol/L (ref 101–111)
CO2: 27 mmol/L (ref 22–32)
CREATININE: 0.74 mg/dL (ref 0.61–1.24)
Calcium: 9.3 mg/dL (ref 8.9–10.3)
GFR calc Af Amer: >60 mL/min (ref >60)
GFR calc non Af Amer: >60 mL/min (ref >60)
GLUCOSE: 98 mg/dL (ref 65–99)
Potassium: 4.2 mmol/L (ref 3.5–5.1)
SODIUM: 139 mmol/L (ref 135–145)
Total Bilirubin: 0.8 mg/dL (ref 0.3–1.2)
Total Protein: 6.6 g/dL (ref 6.5–8.1)

## 2016-02-28 LAB — I-STAT CHEM 8, ED
BUN: 12 mg/dL (ref 6–20)
CHLORIDE: 103 mmol/L (ref 101–111)
Calcium, Ion: 1.12 mmol/L — ABNORMAL LOW (ref 1.15–1.40)
Creatinine, Ser: 0.7 mg/dL (ref 0.61–1.24)
GLUCOSE: 99 mg/dL (ref 65–99)
HCT: 35 % — ABNORMAL LOW (ref 39.0–52.0)
Hemoglobin: 11.9 g/dL — ABNORMAL LOW (ref 13.0–17.0)
POTASSIUM: 4.2 mmol/L (ref 3.5–5.1)
Sodium: 139 mmol/L (ref 135–145)
TCO2: 27 mmol/L (ref 0–100)

## 2016-02-28 LAB — URINALYSIS, ROUTINE W REFLEX MICROSCOPIC
BILIRUBIN URINE: NEGATIVE
Glucose, UA: NEGATIVE mg/dL
Ketones, ur: NEGATIVE mg/dL
Leukocytes, UA: NEGATIVE
Nitrite: NEGATIVE
Protein, ur: NEGATIVE mg/dL
SPECIFIC GRAVITY, URINE: 1.009 (ref 1.005–1.030)
pH: 7 (ref 5.0–8.0)

## 2016-02-28 LAB — SEDIMENTATION RATE: SED RATE: 30 mm/h — AB (ref 0–16)

## 2016-02-28 LAB — DIFFERENTIAL
BASOS ABS: 0 10*3/uL (ref 0.0–0.1)
BASOS PCT: 0 %
Eosinophils Absolute: 0.1 10*3/uL (ref 0.0–0.7)
Eosinophils Relative: 2 %
LYMPHS PCT: 21 %
Lymphs Abs: 1.7 10*3/uL (ref 0.7–4.0)
Monocytes Absolute: 0.9 10*3/uL (ref 0.1–1.0)
Monocytes Relative: 11 %
NEUTROS ABS: 5.4 10*3/uL (ref 1.7–7.7)
Neutrophils Relative %: 66 %

## 2016-02-28 LAB — CBC
HEMATOCRIT: 36 % — AB (ref 39.0–52.0)
Hemoglobin: 12.5 g/dL — ABNORMAL LOW (ref 13.0–17.0)
MCH: 30.6 pg (ref 26.0–34.0)
MCHC: 34.7 g/dL (ref 30.0–36.0)
MCV: 88 fL (ref 78.0–100.0)
PLATELETS: 174 10*3/uL (ref 150–400)
RBC: 4.09 MIL/uL — AB (ref 4.22–5.81)
RDW: 12.2 % (ref 11.5–15.5)
WBC: 8.2 10*3/uL (ref 4.0–10.5)

## 2016-02-28 LAB — PROTIME-INR
INR: 1.06
PROTHROMBIN TIME: 13.8 s (ref 11.4–15.2)

## 2016-02-28 LAB — I-STAT TROPONIN, ED: Troponin i, poc: 0 ng/mL (ref 0.00–0.08)

## 2016-02-28 LAB — URINE MICROSCOPIC-ADD ON

## 2016-02-28 LAB — ETHANOL

## 2016-02-28 MED ORDER — ONDANSETRON HCL 4 MG/2ML IJ SOLN
4.0000 mg | Freq: Once | INTRAMUSCULAR | Status: AC
  Administered 2016-02-28: 4 mg via INTRAVENOUS
  Filled 2016-02-28: qty 2

## 2016-02-28 MED ORDER — FLUORESCEIN SODIUM 1 MG OP STRP
1.0000 | ORAL_STRIP | Freq: Once | OPHTHALMIC | Status: AC
  Administered 2016-02-28: 1 via OPHTHALMIC
  Filled 2016-02-28: qty 1

## 2016-02-28 MED ORDER — IOPAMIDOL (ISOVUE-370) INJECTION 76%
INTRAVENOUS | Status: AC
  Administered 2016-02-28: 50 mL
  Filled 2016-02-28: qty 50

## 2016-02-28 MED ORDER — TETRACAINE HCL 0.5 % OP SOLN
2.0000 [drp] | Freq: Once | OPHTHALMIC | Status: AC
  Administered 2016-02-28: 2 [drp] via OPHTHALMIC
  Filled 2016-02-28: qty 2

## 2016-02-28 MED ORDER — FENTANYL CITRATE (PF) 100 MCG/2ML IJ SOLN
100.0000 ug | Freq: Once | INTRAMUSCULAR | Status: AC
  Administered 2016-02-28: 100 ug via INTRAVENOUS
  Filled 2016-02-28: qty 2

## 2016-02-28 NOTE — ED Notes (Signed)
During the visual acuity test, pt able to read 20/80 in the right eye, 20/100 in the left eye and 20/63 with both eyes. Informed [name] [name] - RN & Dr. Criss AlvineGoldston.

## 2016-02-28 NOTE — ED Provider Notes (Signed)
[facility] DEPT Provider Note   CSN: [id number] Arrival date & time: 02/28/16  1339     History   Chief Complaint Chief Complaint  Patient presents with  . Headache  . Otalgia    HPI [name] [name] is a 70 y.o. male presenting with a left-sided headache for the last 2 days. He also endorses left ear pain that has been going on for the past 2 or 3 months. This is not worse than typical. However the headache is new. He is also noticing some right neck pain. Has not noticed any new numbness or weakness, has chronic left-sided weakness that is mild from previous strokes. He is currently on Plavix. Patient states that his he has not had any fevers. He has had a little bit of a mild cough without productive sputum. No shortness of breath but every time he coughs or moves his headache seems to be worse. He feels like his left temple is swollen and tender. He has noticed vision changes in his left eye, everything seems to be blurry which is new for him. He chronically has left peripheral vision deficits but never having blurry vision. Pain is severe. Has tried aspirin without relief.  HPI  Past Medical History:  Diagnosis Date  . Anxiety   . Carpal tunnel syndrome   . Coronary artery disease   . Depression   . Hepatitis   . Hypercholesteremia   . Hypertension   . Myocardial infarction ([place]) 2010ish  . Pneumonia   . PTSD (post-traumatic stress disorder)   . Sleep apnea     doesnt use CPAP  . Stroke ([place])    slight weakness left- 85- 90 % returned    Patient Active Problem List   Diagnosis Date Noted  . Chest pain 05/27/2014  . Left-sided chest wall pain 01/28/2014  . Upper airway cough syndrome 01/28/2014  . Arteriosclerosis of coronary artery 09/22/2013  . HLD (hyperlipidemia) 09/22/2013  . BP (high blood pressure) 09/22/2013  . Upper respiratory infection 07/25/2013  . Dehydration 07/25/2013  . Carpal tunnel syndrome, right 06/10/2013  . H/O malignant neoplasm of skin  03/19/2013  . Atypical chest pain 04/13/2012  . Slurred speech 04/13/2012  . Blurred vision, bilateral 04/13/2012  . 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 TIA (transient ischemic attack) 11/22/2011  . Volume depletion 11/22/2011  . Aphasia 11/21/2011  . Musculoskeletal chest pain 11/21/2011  . Normocytic anemia 11/21/2011  . Thrombocytopenia ([place]) 11/21/2011  . Metabolic encephalopathy 34/19/6222  . 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 CAD (coronary artery disease) 06/05/2011    Past Surgical History:  Procedure Laterality Date  . CARDIAC CATHETERIZATION    . CARPAL TUNNEL RELEASE Right 06/10/2013   Procedure: RIGHT CARPAL TUNNEL RELEASE;  Surgeon: Mcarthur Rossetti, MD;  Location: [facility];  Service: Orthopedics;  Laterality: Right;  . CORONARY ANGIOPLASTY    . EYE SURGERY Bilateral    cataract  . KNEE ARTHROSCOPY Right   . stents  2012       Home Medications    Prior to Admission medications   Medication Sig Start Date End Date Taking? Authorizing Provider  aspirin EC 81 MG tablet Take 81 mg by mouth daily.   Yes Historical Provider, MD  atorvastatin (LIPITOR) 80 MG tablet Take 40 mg by mouth daily.   Yes Historical Provider, MD  clonazePAM (KLONOPIN) 1 MG tablet Take 1 mg by mouth at bedtime.    Yes Historical Provider, MD  clopidogrel (  PLAVIX) 75 MG tablet Take 75 mg by mouth daily.     Yes Historical Provider, MD  gabapentin (NEURONTIN) 300 MG capsule Take 300 mg by mouth at bedtime.   Yes Historical Provider, MD  lisinopril (PRINIVIL,ZESTRIL) 10 MG tablet Take 5 mg by mouth daily.   Yes Historical Provider, MD  oxyCODONE-acetaminophen (ROXICET) 5-325 MG per tablet Take 1-2 tablets by mouth every 6 (six) hours as needed for severe pain. 09/05/14  Yes Nathan Pickering, MD  senna-docusate (SENNA-PLUS) 8.6-50 MG tablet Take 1 tablet by mouth daily.   Yes Historical Provider, MD  sertraline (ZOLOFT) 100 MG tablet Take 200 mg  by mouth daily.   Yes Historical Provider, MD    Family History Family History  Problem Relation Age of Onset  . Heart disease Mother     Several other maternal family members  . Lung cancer Father     small cell CA/smoked    Social History Social History  Substance Use Topics  . Smoking status: Former Smoker    Packs/day: 2.00    Years: 20.00    Types: Cigarettes    Quit date: 11/21/1987  . Smokeless tobacco: Never Used  . Alcohol use No     Allergies   Dilaudid [hydromorphone hcl] and Clindamycin/lincomycin   Review of Systems Review of Systems  Constitutional: Negative for fever.  HENT: Positive for congestion and ear pain.   Eyes: Positive for photophobia and visual disturbance.  Respiratory: Positive for cough. Negative for shortness of breath.   Cardiovascular: Negative for chest pain.  Gastrointestinal: Positive for nausea. Negative for abdominal pain.  Musculoskeletal: Positive for back pain (chronic) and neck pain.  Neurological: Positive for headaches. Negative for weakness and numbness.  All other systems reviewed and are negative.    Physical Exam Updated Vital Signs BP 111/56   Pulse 67   Temp 98.3 F (36.8 C) (Oral)   Resp 13   SpO2 97%   Physical Exam  Constitutional: He is oriented to person, place, and time. He appears well-developed and well-nourished. No distress.  HENT:  Head: Normocephalic and atraumatic.    Right Ear: Tympanic membrane, external ear and ear canal normal.  Left Ear: Tympanic membrane, external ear and ear canal normal. No mastoid tenderness.  Nose: Nose normal.  Eyes: Conjunctivae and EOM are normal. Pupils are equal, round, and reactive to light. Right eye exhibits no discharge. Left eye exhibits no discharge.  Photophobic IOP 9 No floor seen uptake or obvious abrasions  Neck: Neck supple.  Cardiovascular: Normal rate, regular rhythm and normal heart sounds.   Pulmonary/Chest: Effort normal and breath sounds  normal.  Abdominal: Soft. He exhibits no distension.  Musculoskeletal: He exhibits no edema.  Neurological: He is alert and oriented to person, place, and time.  No facial droop or slurred speech. Symmetric face. 5/5 strength in RUE, RLE. Mild weakness LUE, LLE, chronic per patient.  Skin: Skin is warm and dry. He is not diaphoretic.  Nursing note and vitals reviewed.    ED Treatments / Results  Labs (all labs ordered are listed, but only abnormal results are displayed) Labs Reviewed  CBC - Abnormal; Notable for the following:       Result Value   RBC 4.09 (*)    Hemoglobin 12.5 (*)    HCT 36.0 (*)    All other components within normal limits  COMPREHENSIVE METABOLIC PANEL - Abnormal; Notable for the following:    ALT 16 (*)    All other   components within normal limits  URINALYSIS, ROUTINE W REFLEX MICROSCOPIC (NOT AT [facility]) - Abnormal; Notable for the following:    Hgb urine dipstick SMALL (*)    All other components within normal limits  SEDIMENTATION RATE - Abnormal; Notable for the following:    Sed Rate 30 (*)    All other components within normal limits  URINE MICROSCOPIC-ADD ON - Abnormal; Notable for the following:    Squamous Epithelial / LPF 0-5 (*)    Bacteria, UA RARE (*)    All other components within normal limits  I-STAT CHEM 8, ED - Abnormal; Notable for the following:    Calcium, Ion 1.12 (*)    Hemoglobin 11.9 (*)    HCT 35.0 (*)    All other components within normal limits  ETHANOL  PROTIME-INR  APTT  DIFFERENTIAL  URINE RAPID DRUG SCREEN, HOSP PERFORMED  I-STAT TROPOININ, ED    EKG  EKG Interpretation None       Radiology Ct Angio Head W Or Wo Contrast  Result Date: 02/28/2016 CLINICAL DATA:  69-year-old male with left temporal headache, swelling down the left side of the face and ear. Left visual changes. Right side neck pain. Initial encounter. EXAM: CT ANGIOGRAPHY HEAD AND NECK TECHNIQUE: Multidetector CT imaging of the head and neck was  performed using the standard protocol during bolus administration of intravenous contrast. Multiplanar CT image reconstructions and MIPs were obtained to evaluate the vascular anatomy. Carotid stenosis measurements (when applicable) are obtained utilizing NASCET criteria, using the distal internal carotid diameter as the denominator. CONTRAST:  50 mL Isovue 370 COMPARISON:  Brain MRI and intracranial MRA 04/13/2012. Cervical spine CT 09/05/2014. Head CT 04/13/2012. Chest CT 12/13/2013. FINDINGS: CT HEAD Brain: No midline shift, ventriculomegaly, mass effect, evidence of mass lesion, intracranial hemorrhage or evidence of cortically based acute infarction. Gray-white matter differentiation is within normal limits throughout the brain. Calvarium and skull base: Calvarium appears stable and intact. No acute osseous abnormality identified. Paranasal sinuses: Mild sphenoid sinus mucosal thickening. Other Visualized paranasal sinuses and mastoids are stable and well pneumatized. Orbits: Stable and negative orbits soft tissues aside from postoperative changes to both globes. No scalp soft tissue abnormality identified. Bilateral scalp and periauricular soft tissues appear symmetric and within normal limits. CTA NECK Skeleton: Degenerative changes in the spine. No acute osseous abnormality identified. Incidental spina bifida occulta at T1 (normal variant). Upper chest: Mild peribronchial thickening. Scattered centrilobular emphysema. Peribronchial nodularity in the right upper lobe near the right hilum (Series 501, image 9). This is new since 2015. There is an area of consolidation in the posterior aspect of the right upper lobe near the right upper lobe bronchus on series 501, image 17 which is new. No mediastinal lymphadenopathy. No visible hilar lymphadenopathy. Other neck: Negative thyroid, larynx, pharynx, parapharyngeal spaces, retropharyngeal space, sublingual space, submandibular glands and parotid glands. Left side  cervical lymph nodes appear normal. There is a nonspecific maximal right level IIa lymph node measuring 10 mm short axis (series 501, image 97). Aortic arch: 3 vessel arch configuration. No significant arch atherosclerosis. Partially visible coronary artery calcified plaque and/or left coronary artery stent. Right carotid system: Mildly tortuous brachiocephalic artery and right CCA origin. Mild soft and calcified plaque at the right carotid bifurcation but no right ICA origin or bulb stenosis. Negative cervical right ICA otherwise. Left carotid system: Mildly tortuous proximal left CCA. Negative left carotid bifurcation. Negative cervical left ICA. Vertebral arteries:No proximal right subclavian artery stenosis despite mild soft and   calcified plaque. The right vertebral artery origin is remarkable for soft plaque resulting in mild or perhaps moderate proximal V1 stenosis. See series 501, images 52 and 53 and series 509, image 125. The on the proximal V1 plaque the right vertebral artery is mildly dominant and negative to the skullbase. No proximal left subclavian artery stenosis, minimal proximal left subclavian plaque. Normal left vertebral artery origin and V1 segment. Mildly non dominant left vertebral artery is normal to the skullbase. CTA HEAD Posterior circulation: No distal vertebral artery stenosis. The right vertebral is mildly dominant. Normal PICA origins. Normal vertebrobasilar junction. No basilar stenosis. SCA and left PCA origins are normal. Fetal type right PCA origin. Left posterior communicating artery is diminutive or absent. Bilateral PCA branches are normal. Anterior circulation: Both ICA siphons are patent. Negative left siphon. Negative left ophthalmic artery origin. Minimal calcified plaque of the right siphon without stenosis. Right ophthalmic and posterior communicating artery origins are normal. Patent carotid termini. Normal MCA and ACA origins. Diminutive or absent anterior communicating  artery. Bilateral ACA branches are normal aside from mild tortuosity. Left MCA M1 segment, bifurcation, and left MCA branches are within normal limits. Right MCA M1 segment, bifurcation, and right MCA branches are within normal limits. Venous sinuses: Patent. Anatomic variants: Mildly dominant right vertebral artery. Fetal type right PCA origin. Delayed phase: No abnormal enhancement identified. IMPRESSION: 1. Negative for emergent large vessel occlusion or carotid/anterior circulation stenosis. Mild right and minimal left carotid atherosclerosis. 2. Mild to moderate proximal right vertebral artery stenosis related to soft plaque. Otherwise negative posterior circulation. 3. Abnormal right upper lung, with consolidation and peribronchial nodularity near the right hilum. Recommend follow-up chest radiographs or chest CT to evaluate further. 4. Stable and negative CT appearance of the brain. 5. No scalp or face soft tissue abnormality is identified. Electronically Signed   By: H  Hall M.D.   On: 02/28/2016 18:34   Ct Angio Neck W And/or Wo Contrast  Result Date: 02/28/2016 CLINICAL DATA:  69-year-old male with left temporal headache, swelling down the left side of the face and ear. Left visual changes. Right side neck pain. Initial encounter. EXAM: CT ANGIOGRAPHY HEAD AND NECK TECHNIQUE: Multidetector CT imaging of the head and neck was performed using the standard protocol during bolus administration of intravenous contrast. Multiplanar CT image reconstructions and MIPs were obtained to evaluate the vascular anatomy. Carotid stenosis measurements (when applicable) are obtained utilizing NASCET criteria, using the distal internal carotid diameter as the denominator. CONTRAST:  50 mL Isovue 370 COMPARISON:  Brain MRI and intracranial MRA 04/13/2012. Cervical spine CT 09/05/2014. Head CT 04/13/2012. Chest CT 12/13/2013. FINDINGS: CT HEAD Brain: No midline shift, ventriculomegaly, mass effect, evidence of mass lesion,  intracranial hemorrhage or evidence of cortically based acute infarction. Gray-white matter differentiation is within normal limits throughout the brain. Calvarium and skull base: Calvarium appears stable and intact. No acute osseous abnormality identified. Paranasal sinuses: Mild sphenoid sinus mucosal thickening. Other Visualized paranasal sinuses and mastoids are stable and well pneumatized. Orbits: Stable and negative orbits soft tissues aside from postoperative changes to both globes. No scalp soft tissue abnormality identified. Bilateral scalp and periauricular soft tissues appear symmetric and within normal limits. CTA NECK Skeleton: Degenerative changes in the spine. No acute osseous abnormality identified. Incidental spina bifida occulta at T1 (normal variant). Upper chest: Mild peribronchial thickening. Scattered centrilobular emphysema. Peribronchial nodularity in the right upper lobe near the right hilum (Series 501, image 9). This is new since 2015. There is   an area of consolidation in the posterior aspect of the right upper lobe near the right upper lobe bronchus on series 501, image 17 which is new. No mediastinal lymphadenopathy. No visible hilar lymphadenopathy. Other neck: Negative thyroid, larynx, pharynx, parapharyngeal spaces, retropharyngeal space, sublingual space, submandibular glands and parotid glands. Left side cervical lymph nodes appear normal. There is a nonspecific maximal right level IIa lymph node measuring 10 mm short axis (series 501, image 97). Aortic arch: 3 vessel arch configuration. No significant arch atherosclerosis. Partially visible coronary artery calcified plaque and/or left coronary artery stent. Right carotid system: Mildly tortuous brachiocephalic artery and right CCA origin. Mild soft and calcified plaque at the right carotid bifurcation but no right ICA origin or bulb stenosis. Negative cervical right ICA otherwise. Left carotid system: Mildly tortuous proximal left  CCA. Negative left carotid bifurcation. Negative cervical left ICA. Vertebral arteries:No proximal right subclavian artery stenosis despite mild soft and calcified plaque. The right vertebral artery origin is remarkable for soft plaque resulting in mild or perhaps moderate proximal V1 stenosis. See series 501, images 52 and 53 and series 509, image 125. The on the proximal V1 plaque the right vertebral artery is mildly dominant and negative to the skullbase. No proximal left subclavian artery stenosis, minimal proximal left subclavian plaque. Normal left vertebral artery origin and V1 segment. Mildly non dominant left vertebral artery is normal to the skullbase. CTA HEAD Posterior circulation: No distal vertebral artery stenosis. The right vertebral is mildly dominant. Normal PICA origins. Normal vertebrobasilar junction. No basilar stenosis. SCA and left PCA origins are normal. Fetal type right PCA origin. Left posterior communicating artery is diminutive or absent. Bilateral PCA branches are normal. Anterior circulation: Both ICA siphons are patent. Negative left siphon. Negative left ophthalmic artery origin. Minimal calcified plaque of the right siphon without stenosis. Right ophthalmic and posterior communicating artery origins are normal. Patent carotid termini. Normal MCA and ACA origins. Diminutive or absent anterior communicating artery. Bilateral ACA branches are normal aside from mild tortuosity. Left MCA M1 segment, bifurcation, and left MCA branches are within normal limits. Right MCA M1 segment, bifurcation, and right MCA branches are within normal limits. Venous sinuses: Patent. Anatomic variants: Mildly dominant right vertebral artery. Fetal type right PCA origin. Delayed phase: No abnormal enhancement identified. IMPRESSION: 1. Negative for emergent large vessel occlusion or carotid/anterior circulation stenosis. Mild right and minimal left carotid atherosclerosis. 2. Mild to moderate proximal right  vertebral artery stenosis related to soft plaque. Otherwise negative posterior circulation. 3. Abnormal right upper lung, with consolidation and peribronchial nodularity near the right hilum. Recommend follow-up chest radiographs or chest CT to evaluate further. 4. Stable and negative CT appearance of the brain. 5. No scalp or face soft tissue abnormality is identified. Electronically Signed   By: H  Hall M.D.   On: 02/28/2016 18:34    Procedures Procedures (including critical care time)  Medications Ordered in ED Medications  fentaNYL (SUBLIMAZE) injection 100 mcg (100 mcg Intravenous Given 02/28/16 1628)  ondansetron (ZOFRAN) injection 4 mg (4 mg Intravenous Given 02/28/16 1626)  iopamidol (ISOVUE-370) 76 % injection (50 mLs  Contrast Given 02/28/16 1714)  tetracaine (PONTOCAINE) 0.5 % ophthalmic solution 2 drop (2 drops Left Eye Given 02/28/16 1931)  fluorescein ophthalmic strip 1 strip (1 strip Left Eye Given 02/28/16 1931)     Initial Impression / Assessment and Plan / ED Course  I have reviewed the triage vital signs and the nursing notes.  Pertinent labs & imaging results that   were available during my care of the patient were reviewed by me and considered in my medical decision making (see chart for details).  Clinical Course  Comment By Time  Will check CTA of head and neck given neck pain and vision symptoms. Also will check ESR to help rule out temporal arteritis. IV fentanyl for pain. Neuro exam does not appear new except for the vision changes. Scott Goldston, MD 09/04 1518    Unclear etiology of the patient's left-sided headache. CT head and neck show no obvious pathology. I discussed the CT findings with Dr. Stewart of neurology reckoned is no further neurologic workup in the stenosis would not cause his symptoms as he still having blood flow. No MRI indicated. Patient's headache is improved with fentanyl. Given the blurry vision with headache, I obtained intraocular pressures which is  9 on his left eye. No obvious abrasions. At this point is unclear. His left eye is only minimally worse than his right as far as visual acuity. He has an ophthalmologist and has about multiple procedures on his eyes, follow up with ophthalmology as soon as possible as well as follow-up with his neurologist for the headache. He is artery being scheduled to follow-up with ENT for his ear, which is a chronic problem now. Highly doubt meningitis or other acute infection. Discussed return precautions.  Final Clinical Impressions(s) / ED Diagnoses   Final diagnoses:  Left-sided headache  Blurred vision, left eye    New Prescriptions Discharge Medication List as of 02/28/2016  8:04 PM       Scott Goldston, MD 02/29/16 0010  

## 2016-02-28 NOTE — ED Notes (Signed)
Pt returned from CT °

## 2016-02-28 NOTE — ED Triage Notes (Signed)
Patient here with ongoing left ear pain x 2 months, has been seen by MD for same and no diagnosis. Now for the past 2 days has left temporal headache and states that he has swelling to left side of face with same. Denies trauma. Alert and oriented, no nausea.

## 2018-06-09 ENCOUNTER — Emergency Department (HOSPITAL_COMMUNITY): Payer: Non-veteran care

## 2018-06-09 ENCOUNTER — Other Ambulatory Visit: Payer: Self-pay

## 2018-06-09 ENCOUNTER — Encounter (HOSPITAL_COMMUNITY): Payer: Self-pay | Admitting: Emergency Medicine

## 2018-06-09 ENCOUNTER — Emergency Department (HOSPITAL_COMMUNITY)
Admission: EM | Admit: 2018-06-09 | Discharge: 2018-06-10 | Disposition: A | Discharge status: Home / Self Care | Payer: Non-veteran care | Attending: Emergency Medicine | Admitting: Emergency Medicine

## 2018-06-09 DIAGNOSIS — I2510 Atherosclerotic heart disease of native coronary artery without angina pectoris: Secondary | ICD-10-CM | POA: Diagnosis not present

## 2018-06-09 DIAGNOSIS — R05 Cough: Secondary | ICD-10-CM | POA: Insufficient documentation

## 2018-06-09 DIAGNOSIS — J40 Bronchitis, not specified as acute or chronic: Secondary | ICD-10-CM | POA: Diagnosis not present

## 2018-06-09 DIAGNOSIS — I10 Essential (primary) hypertension: Secondary | ICD-10-CM | POA: Diagnosis not present

## 2018-06-09 DIAGNOSIS — I252 Old myocardial infarction: Secondary | ICD-10-CM | POA: Diagnosis not present

## 2018-06-09 DIAGNOSIS — R0789 Other chest pain: Secondary | ICD-10-CM | POA: Insufficient documentation

## 2018-06-09 DIAGNOSIS — Z79899 Other long term (current) drug therapy: Secondary | ICD-10-CM | POA: Diagnosis not present

## 2018-06-09 DIAGNOSIS — R059 Cough, unspecified: Secondary | ICD-10-CM

## 2018-06-09 DIAGNOSIS — R0602 Shortness of breath: Secondary | ICD-10-CM | POA: Diagnosis present

## 2018-06-09 DIAGNOSIS — Z87891 Personal history of nicotine dependence: Secondary | ICD-10-CM | POA: Diagnosis not present

## 2018-06-09 LAB — CBC
HCT: 36.8 % — ABNORMAL LOW (ref 39.0–52.0)
Hemoglobin: 12.6 g/dL — ABNORMAL LOW (ref 13.0–17.0)
MCH: 29.8 pg (ref 26.0–34.0)
MCHC: 34.2 g/dL (ref 30.0–36.0)
MCV: 87 fL (ref 80.0–100.0)
PLATELETS: 192 10*3/uL (ref 150–400)
RBC: 4.23 MIL/uL (ref 4.22–5.81)
RDW: 11.8 % (ref 11.5–15.5)
WBC: 6.5 10*3/uL (ref 4.0–10.5)
nRBC: 0 % (ref 0.0–0.2)

## 2018-06-09 LAB — BASIC METABOLIC PANEL
Anion gap: 11 (ref 5–15)
BUN: 18 mg/dL (ref 8–23)
CALCIUM: 8.9 mg/dL (ref 8.9–10.3)
CO2: 24 mmol/L (ref 22–32)
CREATININE: 0.82 mg/dL (ref 0.61–1.24)
Chloride: 104 mmol/L (ref 98–111)
GFR calc Af Amer: >60 mL/min (ref >60)
GFR calc non Af Amer: >60 mL/min (ref >60)
GLUCOSE: 134 mg/dL — AB (ref 70–99)
Potassium: 3.8 mmol/L (ref 3.5–5.1)
Sodium: 139 mmol/L (ref 135–145)

## 2018-06-09 LAB — I-STAT TROPONIN, ED: Troponin i, poc: 0 ng/mL (ref 0.00–0.08)

## 2018-06-09 NOTE — ED Triage Notes (Signed)
Pt c/o non-productive cough and cold like symptoms x 1 week. Pt reports shortness of breath and non-radiating chest pain started today.

## 2018-06-10 ENCOUNTER — Telehealth: Payer: Self-pay | Admitting: Emergency Medicine

## 2018-06-10 MED ORDER — GUAIFENESIN-CODEINE 100-10 MG/5ML PO SOLN: 5.0000 mL | Freq: Three times a day (TID) | ORAL | 0 refills | Status: DC | PRN

## 2018-06-10 MED ORDER — GUAIFENESIN-CODEINE 100-10 MG/5ML PO SOLN
5.0000 mL | Freq: Once | ORAL | Status: AC
  Administered 2018-06-10: 5 mL via ORAL
  Filled 2018-06-10: qty 5

## 2018-06-10 MED ORDER — PREDNISONE 20 MG PO TABS
60.0000 mg | ORAL_TABLET | Freq: Once | ORAL | Status: AC
  Administered 2018-06-10: 60 mg via ORAL
  Filled 2018-06-10: qty 3

## 2018-06-10 MED ORDER — GUAIFENESIN-CODEINE 100-10 MG/5ML PO SOLN: 5.0000 mL | Freq: Four times a day (QID) | ORAL | 0 refills | Status: DC | PRN

## 2018-06-10 MED ORDER — BENZONATATE 100 MG PO CAPS
200.0000 mg | ORAL_CAPSULE | Freq: Once | ORAL | Status: AC
Start: 2018-06-10 — End: 2018-06-10
  Administered 2018-06-10: 200 mg via ORAL
  Filled 2018-06-10: qty 2

## 2018-06-10 MED ORDER — IPRATROPIUM-ALBUTEROL 0.5-2.5 (3) MG/3ML IN SOLN
5.0000 mL | Freq: Once | RESPIRATORY_TRACT | Status: AC
  Administered 2018-06-10: 5 mL via RESPIRATORY_TRACT
  Filled 2018-06-10: qty 3

## 2018-06-10 MED ORDER — ALBUTEROL SULFATE HFA 108 (90 BASE) MCG/ACT IN AERS: 1.0000 | INHALATION_SPRAY | Freq: Four times a day (QID) | RESPIRATORY_TRACT | 0 refills | Status: DC | PRN

## 2018-06-10 MED ORDER — BENZONATATE 100 MG PO CAPS: 100.0000 mg | ORAL_CAPSULE | Freq: Three times a day (TID) | ORAL | 0 refills | Status: DC

## 2018-06-10 MED ORDER — PREDNISONE 10 MG PO TABS: 50.0000 mg | ORAL_TABLET | Freq: Every day | ORAL | 0 refills | Status: DC

## 2018-06-10 NOTE — ED Notes (Signed)
Pt has had a cough for over one  Week.  He says he is having very little sputum  He has chest pain from coughing  No temp  Wants relief from coughing

## 2018-06-10 NOTE — ED Notes (Signed)
Pt walked in the hall with a pulse ox  Stas 94 % at  The start   Increased once to 95  Ended walk 02 sats 94.  Pt still coughing almost constantly

## 2018-06-10 NOTE — Telephone Encounter (Signed)
[facility] pharmacy called to confirm prescription for   guaiFENesin-codeine 100-10 MG/5ML syrup  Sig: Take 5 mLs by mouth every 8 (eight) hours as needed for cough.    Start: 06/10/18    Quantity: 90 mL Refills: 0     No further CM needs noted at this time.

## 2018-06-10 NOTE — ED Provider Notes (Addendum)
[facility] EMERGENCY DEPARTMENT Provider Note   CSN: [id number] Arrival date & time: 06/09/18  2304     History   Chief Complaint Chief Complaint  Patient presents with  . Shortness of Breath  . Chest Pain    HPI [name] [name] is a 72 y.o. male.  HPI  72 year old male with history of coronary artery disease, hypertension, hyperlipidemia, CAD and sleep apnea comes in with chief complaint of cough.  Patient has been having a cough for the last 1 week.,  However over the last 2 days his cough has progressed.  Patient is unable to sleep.  Cough is mostly nonproductive.  Patient has subjective fevers without any chills.  He also denies any wheezing or sick contacts.  Patient did get a flu shot earlier in the year.  Past Medical History:  Diagnosis Date  . Anxiety   . Carpal tunnel syndrome   . Coronary artery disease   . Depression   . Hepatitis   . Hypercholesteremia   . Hypertension   . Myocardial infarction (HCC) 2010ish  . Pneumonia   . PTSD (post-traumatic stress disorder)   . Sleep apnea     doesnt use CPAP  . Stroke (HCC)    slight weakness left- 85- 90 % returned    Patient Active Problem List   Diagnosis Date Noted  . Chest pain 05/27/2014  . Left-sided chest wall pain 01/28/2014  . Upper airway cough syndrome 01/28/2014  . Arteriosclerosis of coronary artery 09/22/2013  . HLD (hyperlipidemia) 09/22/2013  . BP (high blood pressure) 09/22/2013  . Upper respiratory infection 07/25/2013  . Dehydration 07/25/2013  . Carpal tunnel syndrome, right 06/10/2013  . H/O malignant neoplasm of skin 03/19/2013  . Atypical chest pain 04/13/2012  . Slurred speech 04/13/2012  . Blurred vision, bilateral 04/13/2012  . 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 TIA (transient ischemic attack) 11/22/2011  . Volume depletion 11/22/2011  . Aphasia 11/21/2011  . Musculoskeletal chest pain 11/21/2011  . Normocytic anemia 11/21/2011  .  Thrombocytopenia (HCC) 11/21/2011  . Metabolic encephalopathy 11/21/2011  . 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 CAD (coronary artery disease) 06/05/2011    Past Surgical History:  Procedure Laterality Date  . CARDIAC CATHETERIZATION    . CARPAL TUNNEL RELEASE Right 06/10/2013   Procedure: RIGHT CARPAL TUNNEL RELEASE;  Surgeon: Kathryne Hitch, MD;  Location: [facility] OR;  Service: Orthopedics;  Laterality: Right;  . CORONARY ANGIOPLASTY    . EYE SURGERY Bilateral    cataract  . KNEE ARTHROSCOPY Right   . stents  2012        Home Medications    Prior to Admission medications   Medication Sig Start Date End Date Taking? Authorizing Provider  aspirin EC 81 MG tablet Take 81 mg by mouth daily.   Yes [provider]  atorvastatin (LIPITOR) 80 MG tablet Take 40 mg by mouth daily.   Yes [provider]  clonazePAM (KLONOPIN) 1 MG tablet Take 1 mg by mouth at bedtime.    Yes [provider]  clopidogrel (PLAVIX) 75 MG tablet Take 75 mg by mouth daily.     Yes [provider]  gabapentin (NEURONTIN) 300 MG capsule Take 300 mg by mouth at bedtime.   Yes [provider]  lisinopril (PRINIVIL,ZESTRIL) 10 MG tablet Take 5 mg by mouth daily.   Yes [provider]  sertraline (ZOLOFT) 100 MG tablet Take 200 mg by mouth daily.  Yes [provider]  albuterol (PROVENTIL HFA;VENTOLIN HFA) 108 (90 Base) MCG/ACT inhaler Inhale 1-2 puffs into the lungs every 6 (six) hours as needed for wheezing or shortness of breath. 06/10/18   Derwood Kaplan, MD  benzonatate (TESSALON) 100 MG capsule Take 1 capsule (100 mg total) by mouth every 8 (eight) hours. 06/10/18   Derwood Kaplan, MD  oxyCODONE-acetaminophen (ROXICET) 5-325 MG per tablet Take 1-2 tablets by mouth every 6 (six) hours as needed for severe pain. Patient not taking: Reported on 06/10/2018 09/05/14   Benjiman Core, MD  predniSONE (DELTASONE)  10 MG tablet Take 5 tablets (50 mg total) by mouth daily. 06/10/18   Derwood Kaplan, MD    Family History Family History  Problem Relation Age of Onset  . Heart disease Mother        Several other maternal family members  . Lung cancer Father        small cell CA/smoked    Social History Social History   Tobacco Use  . Smoking status: Former Smoker    Packs/day: 2.00    Years: 20.00    Pack years: 40.00    Types: Cigarettes    Last attempt to quit: 11/21/1987    Years since quitting: 30.5  . Smokeless tobacco: Never Used  Substance Use Topics  . Alcohol use: No  . Drug use: No     Allergies   Dilaudid [hydromorphone hcl] and Clindamycin/lincomycin   Review of Systems Review of Systems  Constitutional: Positive for activity change.  Respiratory: Positive for cough and shortness of breath.   Cardiovascular: Negative for chest pain.  Gastrointestinal: Negative for nausea and vomiting.  All other systems reviewed and are negative.    Physical Exam Updated Vital Signs BP 129/71 (BP Location: Right Arm)   Pulse 76   Temp 98.2 F (36.8 C) (Oral)   Resp (!) 27   SpO2 95%   Physical Exam Vitals signs and nursing note reviewed.  Constitutional:      Appearance: He is well-developed.  HENT:     Head: Atraumatic.  Neck:     Musculoskeletal: Neck supple.     Vascular: No JVD.  Cardiovascular:     Rate and Rhythm: Normal rate.  Pulmonary:     Effort: Pulmonary effort is normal.     Breath sounds: No wheezing, rhonchi or rales.  Musculoskeletal:     Right lower leg: No edema.     Left lower leg: No edema.  Skin:    General: Skin is warm.  Neurological:     Mental Status: He is alert and oriented to person, place, and time.      ED Treatments / Results  Labs (all labs ordered are listed, but only abnormal results are displayed) Labs Reviewed  BASIC METABOLIC PANEL - Abnormal; Notable for the following components:      Result Value   Glucose, Bld 134  (*)    All other components within normal limits  CBC - Abnormal; Notable for the following components:   Hemoglobin 12.6 (*)    HCT 36.8 (*)    All other components within normal limits  I-STAT TROPONIN, ED    EKG EKG Interpretation  Date/Time:  Sunday June 09 2018 23:09:25 EST Ventricular Rate:  78 PR Interval:  176 QRS Duration: 126 QT Interval:  398 QTC Calculation: 453 R Axis:   87 Text Interpretation:  Normal sinus rhythm Non-specific intra-ventricular conduction block Abnormal ECG No acute changes Confirmed by Rhunette Croft, Jamil Armwood (  [id number]) on 06/10/2018 12:40:57 AM   Radiology Dg Chest 2 View  Result Date: 06/09/2018 CLINICAL DATA:  Chest pain. Patient reports nonproductive cough and cold symptoms. EXAM: CHEST - 2 VIEW COMPARISON:  Radiographs 01/28/2014 FINDINGS: The cardiomediastinal contours are normal. Coronary stent is visualized. Mild biapical pleuroparenchymal scarring. Pulmonary vasculature is normal. No consolidation, pleural effusion, or pneumothorax. No acute osseous abnormalities are seen. Degenerative change in the spine. IMPRESSION: No acute pulmonary process. Electronically Signed   By: Narda RutherfordMelanie  Sanford M.D.   On: 06/09/2018 23:41    Procedures Procedures (including critical care time)  Medications Ordered in ED Medications  predniSONE (DELTASONE) tablet 60 mg (has no administration in time range)  ipratropium-albuterol (DUONEB) 0.5-2.5 (3) MG/3ML nebulizer solution 5 mL (5 mLs Nebulization Given 06/10/18 0140)  benzonatate (TESSALON) capsule 200 mg (200 mg Oral Given 06/10/18 0139)     Initial Impression / Assessment and Plan / ED Course  I have reviewed the triage vital signs and the nursing notes.  Pertinent labs & imaging results that were available during my care of the patient were reviewed by me and considered in my medical decision making (see chart for details).    72 year old male comes in with chief complaint of cough.  He has been having a  cough for the last week that has worsened over the last 2 days.  Cough is mostly nonproductive.  Patient does not have any underlying lung disease, but does have CAD.  On exam he is appearing nontoxic, however his cough is pretty severe and he appears uncomfortable because of it.  There is no wheezing on exam.  No focal signs of consolidation on chest x-ray.  White count is also normal.  Patient likely has a viral syndrome going on, possibly a mild flu.  He is outside of treatment window for flu.  There is no hypoxia or respiratory distress.  We will give him a breathing treatment to see if that helps him.  3:50 AM Patient reassessed.  Results of the ER work-up discussed with the patient.  He reports that his breathing improved slightly with the nebulizer treatment.  No significant change noted by him.  He was able to get around without significant discomfort.  Patient has a PCP at the [facility].  We will discharge him with albuterol and Tessalon Perles along with prednisone.  He will return to the ER if his symptoms get worse otherwise he will follow-up with PCP.  Final Clinical Impressions(s) / ED Diagnoses   Final diagnoses:  Bronchitis  Cough    ED Discharge Orders         Ordered    predniSONE (DELTASONE) 10 MG tablet  Daily     06/10/18 0348    albuterol (PROVENTIL HFA;VENTOLIN HFA) 108 (90 Base) MCG/ACT inhaler  Every 6 hours PRN     06/10/18 0348    benzonatate (TESSALON) 100 MG capsule  Every 8 hours     06/10/18 0348           Derwood KaplanNanavati, Kawon Willcutt, MD 06/10/18 [id number]    Derwood KaplanNanavati, Jamontae Thwaites, MD 06/10/18 [phone]

## 2018-06-10 NOTE — Discharge Instructions (Signed)
We saw in the ER for severe cough. The lab work-up in the ER and the chest x-ray did not reveal any concerning findings.  We are sending you home with prednisone and albuterol that you need to take for bronchitis. If you start developing fever then please fill the prescription antibiotics.  We are not sure what is causing the cough.  It could be that this is a viral infection, in which case it will get better with the medications prescribed.  The other possibility includes medication side effects including lisinopril which you are taking for blood pressure.  Please take Tessalon Perles and also consider simple remedies like teaspoon of honey at nighttime.  It is prudent that you follow-up with your primary care doctor in 3 to 5 days. Return to the ER if your symptoms are getting worse and you are having worsening shortness of breath.

## 2018-06-10 NOTE — ED Notes (Signed)
Pt wants something else for cough

## 2019-06-12 ENCOUNTER — Ambulatory Visit
Admission: EM | Admit: 2019-06-12 | Discharge: 2019-06-12 | Disposition: A | Discharge status: Home / Self Care | Payer: Non-veteran care | Attending: Physician Assistant | Admitting: Physician Assistant

## 2019-06-12 ENCOUNTER — Other Ambulatory Visit: Payer: Self-pay

## 2019-06-12 ENCOUNTER — Encounter: Payer: Self-pay | Admitting: Emergency Medicine

## 2019-06-12 ENCOUNTER — Ambulatory Visit (INDEPENDENT_AMBULATORY_CARE_PROVIDER_SITE_OTHER): Payer: Non-veteran care

## 2019-06-12 DIAGNOSIS — M25562 Pain in left knee: Secondary | ICD-10-CM

## 2019-06-12 DIAGNOSIS — W19XXXA Unspecified fall, initial encounter: Secondary | ICD-10-CM | POA: Diagnosis not present

## 2019-06-12 DIAGNOSIS — M1712 Unilateral primary osteoarthritis, left knee: Secondary | ICD-10-CM | POA: Diagnosis not present

## 2019-06-12 MED ORDER — PREDNISONE 50 MG PO TABS: 50.0000 mg | ORAL_TABLET | Freq: Every day | ORAL | 0 refills | Status: DC

## 2019-06-12 NOTE — ED Notes (Signed)
Patient able to ambulate independently  

## 2019-06-12 NOTE — ED Provider Notes (Signed)
[facility]    CSN: [id number] Arrival date & time: 06/12/19  1508      History   Chief Complaint Chief Complaint  Patient presents with  . Knee Pain    HPI [name] [name] is a 73 y.o. male.   73 year old male with history of CAD, HTN, HLD, CVA comes in for 2-week history of left knee pain after fall.  Patient states was walking when he slipped, and fell in a "trench".  During the process, he felt that he twisted his knee.  Since then has had swelling, pain to the medial knee, decrease in flexion.  Denies numbness, tingling.  States due to painful weightbearing, has been using a walking stick.  Has been trying ice compress, heat compress, elevation, Aleve, ibuprofen without much relief.     Past Medical History:  Diagnosis Date  . Anxiety   . Carpal tunnel syndrome   . Coronary artery disease   . Depression   . Hepatitis   . Hypercholesteremia   . Hypertension   . Myocardial infarction ([place]) 2010ish  . Pneumonia   . PTSD (post-traumatic stress disorder)   . Sleep apnea     doesnt use CPAP  . Stroke ([place])    slight weakness left- 85- 90 % returned    Patient Active Problem List   Diagnosis Date Noted  . Chest pain 05/27/2014  . Left-sided chest wall pain 01/28/2014  . Upper airway cough syndrome 01/28/2014  . Arteriosclerosis of coronary artery 09/22/2013  . HLD (hyperlipidemia) 09/22/2013  . BP (high blood pressure) 09/22/2013  . Upper respiratory infection 07/25/2013  . Dehydration 07/25/2013  . Carpal tunnel syndrome, right 06/10/2013  . H/O malignant neoplasm of skin 03/19/2013  . Atypical chest pain 04/13/2012  . Slurred speech 04/13/2012  . Blurred vision, bilateral 04/13/2012  . 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 TIA (transient ischemic attack) 11/22/2011  . Volume depletion 11/22/2011  . Aphasia 11/21/2011  . Musculoskeletal chest pain 11/21/2011  . Normocytic anemia 11/21/2011  . Thrombocytopenia ([place]) 11/21/2011   . Metabolic encephalopathy 24/02/7352  . 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 CAD (coronary artery disease) 06/05/2011    Past Surgical History:  Procedure Laterality Date  . CARDIAC CATHETERIZATION    . CARPAL TUNNEL RELEASE Right 06/10/2013   Procedure: RIGHT CARPAL TUNNEL RELEASE;  Surgeon: Mcarthur Rossetti, MD;  Location: [facility];  Service: Orthopedics;  Laterality: Right;  . CORONARY ANGIOPLASTY    . EYE SURGERY Bilateral    cataract  . KNEE ARTHROSCOPY Right   . stents  2012       Home Medications    Prior to Admission medications   Medication Sig Start Date End Date Taking? Authorizing Provider  albuterol (PROVENTIL HFA;VENTOLIN HFA) 108 (90 Base) MCG/ACT inhaler Inhale 1-2 puffs into the lungs every 6 (six) hours as needed for wheezing or shortness of breath. 06/10/18   Varney Biles, MD  aspirin EC 81 MG tablet Take 81 mg by mouth daily.    [provider]  atorvastatin (LIPITOR) 80 MG tablet Take 40 mg by mouth daily.    [provider]  clonazePAM (KLONOPIN) 1 MG tablet Take 1 mg by mouth at bedtime.     [provider]  clopidogrel (PLAVIX) 75 MG tablet Take 75 mg by mouth daily.      [provider]  gabapentin (NEURONTIN) 300 MG capsule Take 300 mg by mouth at bedtime.    [provider]  lisinopril (PRINIVIL,ZESTRIL) 10 MG tablet Take 5 mg by mouth daily.    [provider]  predniSONE (DELTASONE) 50 MG tablet Take 1 tablet (50 mg total) by mouth daily with breakfast. 06/12/19   Cathie Hoops, Hildegard Hlavac V, PA-C  sertraline (ZOLOFT) 100 MG tablet Take 200 mg by mouth daily.    [provider]    Family History Family History  Problem Relation Age of Onset  . Heart disease Mother        Several other maternal family members  . Lung cancer Father        small cell CA/smoked    Social History Social History   Tobacco Use  . Smoking status: Former Smoker    Packs/day:  2.00    Years: 20.00    Pack years: 40.00    Types: Cigarettes    Quit date: 11/21/1987    Years since quitting: 31.5  . Smokeless tobacco: Never Used  Substance Use Topics  . Alcohol use: No  . Drug use: No     Allergies   Dilaudid [hydromorphone hcl] and Clindamycin/lincomycin   Review of Systems Review of Systems  Reason unable to perform ROS: See HPI as above.     Physical Exam Triage Vital Signs ED Triage Vitals  Enc Vitals Group     BP 06/12/19 1512 129/75     Pulse Rate 06/12/19 1512 74     Resp 06/12/19 1512 18     Temp 06/12/19 1512 97.8 F (36.6 C)     Temp Source 06/12/19 1512 Temporal     SpO2 06/12/19 1512 94 %     Weight --      Height --      Head Circumference --      Peak Flow --      Pain Score 06/12/19 1513 10     Pain Loc --      Pain Edu? --      Excl. in GC? --    No data found.  Updated Vital Signs BP 129/75 (BP Location: Left Arm)   Pulse 74   Temp 97.8 F (36.6 C) (Temporal)   Resp 18   SpO2 94%   Physical Exam Constitutional:      General: He is not in acute distress.    Appearance: He is well-developed. He is not diaphoretic.  HENT:     Head: Normocephalic and atraumatic.  Eyes:     Conjunctiva/sclera: Conjunctivae normal.     Pupils: Pupils are equal, round, and reactive to light.  Cardiovascular:     Rate and Rhythm: Normal rate and regular rhythm.     Heart sounds: Normal heart sounds. No murmur. No friction rub. No gallop.   Pulmonary:     Effort: Pulmonary effort is normal. No accessory muscle usage or respiratory distress.     Breath sounds: Normal breath sounds. No stridor. No decreased breath sounds, wheezing, rhonchi or rales.  Musculoskeletal:     Comments: No obvious swelling, erythema, warmth, contusion. Tenderness to palpation along medial joint line. Full extension, decreased flexion to about 60 degrees. Unable to tolerate attempts for valgus or varus maneuver.  Strength 3 out of 5 due to pain.  Sensation 4  out of 5 to the left, which patient states is at baseline post CVA.  Skin:    General: Skin is warm and dry.  Neurological:     Mental Status: He is alert and oriented to person, place, and time.  UC Treatments / Results  Labs (all labs ordered are listed, but only abnormal results are displayed) Labs Reviewed - No data to display  EKG   Radiology DG Knee Complete 4 Views Left  Result Date: 06/12/2019 CLINICAL DATA:  Persistent knee pain since fall 2 weeks ago. EXAM: LEFT KNEE - COMPLETE 4+ VIEW COMPARISON:  Left knee x-rays dated December 13, 2013. FINDINGS: No acute fracture or dislocation. No joint effusion. Slightly worsened mild medial compartment joint space narrowing. Anterior infrapatellar soft tissue swelling. IMPRESSION: 1. No acute osseous abnormality. Anterior infrapatellar soft tissue swelling. 2. Mild medial compartment osteoarthritis. Electronically Signed   By: Obie DredgeWilliam T Derry M.D.   On: 06/12/2019 16:07    Procedures Procedures (including critical care time)  Medications Ordered in UC Medications - No data to display  Initial Impression / Assessment and Plan / UC Course  I have reviewed the triage vital signs and the nursing notes.  Pertinent labs & imaging results that were available during my care of the patient were reviewed by me and considered in my medical decision making (see chart for details).    X-ray without fracture.  Does show anterior infrapatellar soft tissue swelling, medial compartment osteoarthritis.  Discussed this does not rule out possible meniscus injury.  At this time, will provide prednisone to decrease inflammation as patient is on Plavix.  We will continue ice compress and provide knee sleeve during activity.  Patient to follow-up with orthopedics for further evaluation and management needed.  Return precautions given.  Final Clinical Impressions(s) / UC Diagnoses   Final diagnoses:  Acute pain of left knee   ED Prescriptions     Medication Sig Dispense Auth. Provider   predniSONE (DELTASONE) 50 MG tablet Take 1 tablet (50 mg total) by mouth daily with breakfast. 5 tablet Belinda FisherYu, Pat Elicker V, PA-C     PDMP not reviewed this encounter.   Belinda FisherYu, Amera Banos V, PA-C 06/12/19 1840

## 2019-06-12 NOTE — Discharge Instructions (Signed)
X-ray did not show any bone breaks, but did show arthritis and swelling.  As discussed, x-ray does not rule out meniscus injury.  Start prednisone for inflammation.  Ice compress, knee sleeve during activity.  Please follow-up with orthopedics for further evaluation and management needed.

## 2019-06-12 NOTE — ED Triage Notes (Signed)
Pt presents to [facility] for assessment of left knee pain after he fell in a "trench" 2 weeks ago.  States ever since he has been having left knee pain.

## 2019-07-14 ENCOUNTER — Encounter: Payer: Self-pay | Admitting: Physician Assistant

## 2019-07-14 ENCOUNTER — Ambulatory Visit: Payer: Non-veteran care

## 2019-07-14 ENCOUNTER — Other Ambulatory Visit: Payer: Self-pay

## 2019-07-14 ENCOUNTER — Ambulatory Visit (INDEPENDENT_AMBULATORY_CARE_PROVIDER_SITE_OTHER): Payer: No Typology Code available for payment source | Admitting: Physician Assistant

## 2019-07-14 DIAGNOSIS — M25562 Pain in left knee: Secondary | ICD-10-CM | POA: Diagnosis not present

## 2019-07-14 DIAGNOSIS — S0181XA Laceration without foreign body of other part of head, initial encounter: Secondary | ICD-10-CM | POA: Diagnosis not present

## 2019-07-14 NOTE — Progress Notes (Signed)
Office Visit Note   Patient: [name] [name]           Date of Birth: 12-02-1945           MRN: [id number] Visit Date: 07/14/2019              Requested by: Louanne Skye, MD [address] [place],  [place] [address] PCP: Louanne Skye, MD   Assessment & Plan: Visit Diagnoses:  1. Acute pain of left knee   2. Laceration of forehead, initial encounter     Plan: He will get his records from the [place] in regards to the left knee MRI needs both the MRI images and the report and then follow-up with [place] on Wednesday.  In regards to his forehead laceration if keep the area clean and dry.  If he develops any headaches or visual changes he is to go to the ER.  We will check the laceration at visit in 2 days. Laceration was cleaned with saline and then Dermabond was used to close this Steri-Strips applied.  Patient tolerated well.  Follow-Up Instructions: Return in about 2 days (around 07/16/2019).   Orders:  Orders Placed This Encounter  Procedures  . XR Knee 1-2 Views Left   No orders of the defined types were placed in this encounter.     Procedures: No procedures performed   Clinical Data: No additional findings.   Subjective: Chief Complaint  Patient presents with  . Left Knee - Pain, Injury    HPI [name] [name] 74 year old male who was seen as a new patient however he seen Dr. Magnus Ivan in the past.  He comes in today due to left knee pain.  He injured it 2-1/2 months ago when he slipped.  He has had multiple falls due to left knee giving way on him since that time.  He reports that he went to the [place] and a MRI of the knee was obtained and showed a meniscal tear.  He does not have the report and then is unavailable nor does he have the images today.  Reports that the orthopedic surgeon there told him that he was too high risk for them to do any type of surgery on his knee.  Does have a history of stroke and MI. Unfortunately last night he was in his shop and fell struck his head  against a piece of firewood that he was carrying.  He denies any change in vision or headaches.  His wife was able to use some butterfly strips that she obtained at the drugstore to close the wound on his forehead.  He had no loss of consciousness.  No other injury.  Feels that the knee just gave way on him.  He has used a knee brace in the past and uses this usually whenever he is out of the home.  He is using a cane to ambulate.  Most of his pain is medial aspect of the knee he describes as a sharp stabbing burning pain note some swelling. Review of Systems Please see HPI otherwise negative or noncontributory  Objective: Vital Signs: There were no vitals taken for this visit.  Physical Exam Pulmonary:     Effort: Pulmonary effort is normal.  Neurological:     Mental Status: He is alert and oriented to person, place, and time.  Psychiatric:        Behavior: Behavior normal.   Head: Just superior to the right eyebrow he has a laceration and some edema.  There is no active bleeding.  No signs of infection.  Ortho Exam Left knee full extension flexion limited to approximately 90 degrees.  Tenderness along the medial joint line of the left knee.  No abnormal warmth erythema or effusion.  Positive McMurray's.  Calf supple nontender. Specialty Comments:  No specialty comments available.  Imaging: XR Knee 1-2 Views Left  Result Date: 07/14/2019 Left knee AP lateral views: No acute fractures.  No bony abnormalities.  Mild medial compartmental narrowing.  Mild patellofemoral changes.  Lateral compartment is well-preserved.    PMFS History: Patient Active Problem List   Diagnosis Date Noted  . Chest pain 05/27/2014  . Left-sided chest wall pain 01/28/2014  . Upper airway cough syndrome 01/28/2014  . Arteriosclerosis of coronary artery 09/22/2013  . HLD (hyperlipidemia) 09/22/2013  . BP (high blood pressure) 09/22/2013  . Upper respiratory infection 07/25/2013  . Dehydration 07/25/2013   . Carpal tunnel syndrome, right 06/10/2013  . H/O malignant neoplasm of skin 03/19/2013  . Atypical chest pain 04/13/2012  . Slurred speech 04/13/2012  . Blurred vision, bilateral 04/13/2012  . 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 TIA (transient ischemic attack) 11/22/2011  . Volume depletion 11/22/2011  . Aphasia 11/21/2011  . Musculoskeletal chest pain 11/21/2011  . Normocytic anemia 11/21/2011  . Thrombocytopenia ([facility]) 11/21/2011  . Metabolic encephalopathy 27/11/2374  . 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 CAD (coronary artery disease) 06/05/2011   Past Medical History:  Diagnosis Date  . Anxiety   . Carpal tunnel syndrome   . Coronary artery disease   . Depression   . Hepatitis   . Hypercholesteremia   . Hypertension   . Myocardial infarction ([facility]) 2010ish  . Pneumonia   . PTSD (post-traumatic stress disorder)   . Sleep apnea     doesnt use CPAP  . Stroke ([facility])    slight weakness left- 85- 90 % returned    Family History  Problem Relation Age of Onset  . Heart disease Mother        Several other maternal family members  . Lung cancer Father        small cell CA/smoked    Past Surgical History:  Procedure Laterality Date  . CARDIAC CATHETERIZATION    . CARPAL TUNNEL RELEASE Right 06/10/2013   Procedure: RIGHT CARPAL TUNNEL RELEASE;  Surgeon: Mcarthur Rossetti, MD;  Location: [facility];  Service: Orthopedics;  Laterality: Right;  . CORONARY ANGIOPLASTY    . EYE SURGERY Bilateral    cataract  . KNEE ARTHROSCOPY Right   . stents  2012   Social History   Occupational History  . Occupation: Retired Consulting civil engineer  . Smoking status: Former Smoker    Packs/day: 2.00    Years: 20.00    Pack years: 40.00    Types: Cigarettes    Quit date: 11/21/1987    Years since quitting: 31.6  . Smokeless tobacco: Never Used  Substance and Sexual Activity  . Alcohol use: No  . Drug use: No  . Sexual activity:  Not on file

## 2019-07-16 ENCOUNTER — Ambulatory Visit (INDEPENDENT_AMBULATORY_CARE_PROVIDER_SITE_OTHER): Payer: No Typology Code available for payment source | Admitting: Physician Assistant

## 2019-07-16 ENCOUNTER — Other Ambulatory Visit: Payer: Self-pay

## 2019-07-16 ENCOUNTER — Encounter: Payer: Self-pay | Admitting: Physician Assistant

## 2019-07-16 DIAGNOSIS — S83242S Other tear of medial meniscus, current injury, left knee, sequela: Secondary | ICD-10-CM | POA: Diagnosis not present
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
## 2019-07-22 ENCOUNTER — Telehealth: Payer: Self-pay | Admitting: Physician Assistant

## 2019-07-22 NOTE — Telephone Encounter (Signed)
07/16/19 ov note faxed to [facility] [phone], Efraim Kaufmann [phone]

## 2019-07-25 ENCOUNTER — Other Ambulatory Visit: Payer: Self-pay

## 2019-07-25 ENCOUNTER — Ambulatory Visit (INDEPENDENT_AMBULATORY_CARE_PROVIDER_SITE_OTHER): Payer: No Typology Code available for payment source | Admitting: Internal Medicine

## 2019-07-25 ENCOUNTER — Encounter: Payer: Self-pay | Admitting: Internal Medicine

## 2019-07-25 VITALS — BP 135/78 | HR 59 | Temp 97.1°F | Ht 70.0 in | Wt 215.2 lb

## 2019-07-25 DIAGNOSIS — G459 Transient cerebral ischemic attack, unspecified: Secondary | ICD-10-CM | POA: Diagnosis not present

## 2019-07-25 DIAGNOSIS — Z01810 Encounter for preprocedural cardiovascular examination: Secondary | ICD-10-CM | POA: Diagnosis not present

## 2019-07-25 DIAGNOSIS — E785 Hyperlipidemia, unspecified: Secondary | ICD-10-CM

## 2019-07-25 DIAGNOSIS — G450 Vertebro-basilar artery syndrome: Secondary | ICD-10-CM | POA: Diagnosis not present

## 2019-07-25 DIAGNOSIS — I2510 Atherosclerotic heart disease of native coronary artery without angina pectoris: Secondary | ICD-10-CM

## 2019-07-25 DIAGNOSIS — I10 Essential (primary) hypertension: Secondary | ICD-10-CM

## 2019-07-25 NOTE — Progress Notes (Signed)
Cardiology Office Note:    Date:  07/25/2019   ID:  [name] [name], DOB March 05, 1946, MRN [id number]  PCP:  [name] Skye, MD  Cardiologist:  No primary care provider on file.  Electrophysiologist:  None   Referring MD: [name] Skye, MD   Chief Complaint: Preoperative cardiovascular evaluation  History of Present Illness:    [name] [name] is a 74 y.o. male with history of MI status post PCI and prior TIAs who presents for preoperative cardiovascular evaluation prior to left knee surgery for torn meniscus.  He is a prior patient of Dr. Graciela [name] and Dr. Eldridge [name], my partners.  [name] [name] had a ramus intermedius stent in October 2012 during an episode of substernal chest pressure and pain with shortness of breath.  His procedure was complicated, with possible complication of right radial artery damage, the next procedure was performed from the groin.  He has a 2.5 x 38 xience stent.  He tells me his last MI was approximately 3 years ago, I do not have records for this.  He denies any exertional chest pain or limiting shortness of breath.  He says that primarily when he is out and about he feels zapped energy wise.  He tires out around 3 PM every day and knows when to stop and rest.  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  He is experiencing frequent falls, possibly due to lack of proprioception in the left leg which he attributes to previous history of TIAs.  He feels that his left side is weaker.  In addition he has a torn right biceps which creates some weakness in his right arm.  He has not had operative management of this and prefers to treat it conservatively.  He feels unsteady on his feet and fell hard in his workshop at one point with damage to the fourth and fifth digits of his right hand which has limited his ability to play the guitar.  His biceps tendon injury has limited his ability to play golf, these are activities he  enjoys doing.  He is a [place] veteran and suffers with vivid nightmares at night which is limiting his sleep quality. He had back surgery approximately 3 years ago which he tells me he had no complications with anesthesia and was able to successfully hold his Plavix (indication stroke) prior to procedure without cardiovascular complications.  He has a history of sleep apnea and previously used CPAP, however after he had nasal surgery his sleep symptoms have improved significantly and he does not use this any longer.  Past Medical History:  Diagnosis Date  . Anxiety   . Carpal tunnel syndrome   . Coronary artery disease   . Depression   . Hepatitis   . Hypercholesteremia   . Hypertension   . Myocardial infarction ([facility]) 2010ish  . Pneumonia   . PTSD (post-traumatic stress disorder)   . Sleep apnea     doesnt use CPAP  . Stroke ([facility])    slight weakness left- 85- 90 % returned    Past Surgical History:  Procedure Laterality Date  . CARDIAC CATHETERIZATION    . CARPAL TUNNEL RELEASE Right 06/10/2013   Procedure: RIGHT CARPAL TUNNEL RELEASE;  Surgeon: Kathryne Hitch, MD;  Location[facility] OR;  Service: Orthopedics;  Laterality: Right;  . CORONARY ANGIOPLASTY    . EYE SURGERY Bilateral    cataract  . KNEE ARTHROSCOPY Right   . stents  2012  Current Medications: Current Meds  Medication Sig  . aspirin EC 81 MG tablet Take 81 mg by mouth daily.  Marland Kitchen atorvastatin (LIPITOR) 80 MG tablet Take 40 mg by mouth daily.  . clonazePAM (KLONOPIN) 1 MG tablet Take 1 mg by mouth at bedtime.   . clopidogrel (PLAVIX) 75 MG tablet Take 75 mg by mouth daily.    Marland Kitchen gabapentin (NEURONTIN) 300 MG capsule Take 300 mg by mouth at bedtime.  Marland Kitchen lisinopril (PRINIVIL,ZESTRIL) 10 MG tablet Take 5 mg by mouth daily.  . sertraline (ZOLOFT) 100 MG tablet Take 200 mg by mouth daily.     Allergies:   Dilaudid [hydromorphone hcl] and Clindamycin/lincomycin   Social History   Socioeconomic History  .  Marital status: Married    Spouse name: Not on file  . Number of children: Not on file  . Years of education: Not on file  . Highest education level: Not on file  Occupational History  . Occupation: Retired Consulting civil engineer  . Smoking status: Former Smoker    Packs/day: 2.00    Years: 20.00    Pack years: 40.00    Types: Cigarettes    Quit date: 11/21/1987    Years since quitting: 31.6  . Smokeless tobacco: Never Used  Substance and Sexual Activity  . Alcohol use: No  . Drug use: No  . Sexual activity: Not on file  Other Topics Concern  . Not on file  Social History Narrative  . Not on file   Social Determinants of Health   Financial Resource Strain:   . Difficulty of Paying Living Expenses: Not on file  Food Insecurity:   . Worried About Charity fundraiser in the Last Year: Not on file  . Ran Out of Food in the Last Year: Not on file  Transportation Needs:   . Lack of Transportation (Medical): Not on file  . Lack of Transportation (Non-Medical): Not on file  Physical Activity:   . Days of Exercise per Week: Not on file  . Minutes of Exercise per Session: Not on file  Stress:   . Feeling of Stress : Not on file  Social Connections:   . Frequency of Communication with Friends and Family: Not on file  . Frequency of Social Gatherings with Friends and Family: Not on file  . Attends Religious Services: Not on file  . Active Member of Clubs or Organizations: Not on file  . Attends Archivist Meetings: Not on file  . Marital Status: Not on file     Family History: The patient's family history includes Heart disease in his mother; Lung cancer in his father.  ROS:   Please see the history of present illness.    All other systems reviewed and are negative.  EKGs/Labs/Other Studies Reviewed:    The following studies were reviewed today:  EKG: Sinus bradycardia rate 59, incomplete right bundle branch block with QRS duration 118 ms  Recent Labs: No  results found for requested labs within last 8760 hours.  Recent Lipid Panel    Component Value Date/Time   CHOL 186 04/13/2012 2344   TRIG 269 (H) 04/13/2012 2344   HDL 33 (L) 04/13/2012 2344   CHOLHDL 5.6 04/13/2012 2344   VLDL 54 (H) 04/13/2012 2344   LDLCALC 99 04/13/2012 2344    Physical Exam:    VS:  BP 135/78 (BP Location: Right Arm)   Pulse (!) 59   Temp (!) 97.1 F (36.2 C)  Ht 5\' 10"  (1.778 m)   Wt 215 lb 3.2 oz (97.6 kg)   SpO2 97%   BMI 30.88 kg/m     Wt Readings from Last 5 Encounters:  07/25/19 215 lb 3.2 oz (97.6 kg)  01/28/14 224 lb (101.6 kg)  07/25/13 220 lb (99.8 kg)  06/24/13 220 lb (99.8 kg)  06/10/13 222 lb 2 oz (100.8 kg)     Constitutional: No acute distress Eyes: sclera non-icteric, normal conjunctiva and lids ENMT: moist mucous membranes Cardiovascular: regular rhythm, normal rate, no murmurs. S1 and S2 normal. No jugular venous distention.  Respiratory: clear to auscultation bilaterally GI : normal bowel sounds, soft and nontender. No distention.   MSK: extremities warm, well perfused. No edema.  NEURO: grossly nonfocal exam, moves all extremities. PSYCH: alert and oriented x 3, normal mood and affect.   ASSESSMENT:    1. Preoperative cardiovascular examination   2. Coronary artery disease involving native coronary artery of native heart without angina pectoris   3. TIA (transient ischemic attack)   4. Vertebrobasilar artery insufficiency   5. Hyperlipidemia, unspecified hyperlipidemia type   6. Essential hypertension    PLAN:    Preoperative cardiovascular evaluation-the patient is overall intermediate risk for low risk procedure.  He is intermediate risk given his history of coronary artery disease requiring revascularization with PCI and his history of TIAs requiring indefinite Plavix therapy.  Fortunately he is asymptomatic from a cardiopulmonary standpoint and requires no further cardiovascular testing prior to left knee surgery  for his meniscus.  If this level of risk is deemed acceptable by the patient and surgical team, the patient should be considered optimized from a cardiovascular standpoint.  The patient should contact our office when his surgery is scheduled and we will advise him on holding Plavix.  He should plan to hold Plavix for 5 days prior to his procedure.  Given his history of intracoronary stents, would recommend continuation of aspirin throughout the perioperative period if possible from a surgical standpoint.  Resume Plavix as soon as possible after surgery when safe from a bleeding standpoint.  Coronary artery disease-the patient will continue on aspirin 81 mg daily, takes Plavix for the indication of stroke per his report, continue atorvastatin 40 mg daily, continue lisinopril 5 mg daily.  I will plan to see the patient back to continue cardiovascular care in approximately 6 months, he should contact 06/12/13 if there are any concerning symptoms in the interim.   Total time of encounter: 60 minutes total time of encounter, including 40 minutes spent in face-to-face patient care. This time includes coordination of care and counseling regarding preop assessment, CAD. Remainder of non-face-to-face time involved reviewing chart documents/testing relevant to the patient encounter and documentation in the medical record.  I reviewed approximately 20 pages of outside medical records as part of this encounter.  [place], MD [place]  [facility]   Medication Adjustments/Labs and Tests Ordered: Current medicines are reviewed at length with the patient today.  Concerns regarding medicines are outlined above.  Orders Placed This Encounter  Procedures  . EKG 12-Lead   No orders of the defined types were placed in this encounter.   Patient Instructions  Medication Instructions:  No changes  *If you need a refill on your cardiac medications before your next appointment, please call your  pharmacy*  Lab Work: Not needed   Testing/Procedures: Not needed  Follow-Up: At [facility], you and your health needs are our priority.  As part of  our continuing mission to provide you with exceptional heart care, we have created designated Provider Care Teams.  These Care Teams include your primary Cardiologist (physician) and Advanced Practice Providers (APPs -  Physician Assistants and Nurse Practitioners) who all work together to provide you with the care you need, when you need it.  Your next appointment:   6 month(s)  The format for your next appointment:   In Person  Provider:   Weston Brass, MD  Other instruction  You have cardiac clearance for left knee surgery  Can hold plavix for 5 days prior  -the surgeon will give you more details.

## 2019-07-25 NOTE — Patient Instructions (Addendum)
Medication Instructions:  No changes  *If you need a refill on your cardiac medications before your next appointment, please call your pharmacy*  Lab Work: Not needed   Testing/Procedures: Not needed  Follow-Up: At [facility], you and your health needs are our priority.  As part of our continuing mission to provide you with exceptional heart care, we have created designated Provider Care Teams.  These Care Teams include your primary Cardiologist (physician) and Advanced Practice Providers (APPs -  Physician Assistants and Nurse Practitioners) who all work together to provide you with the care you need, when you need it.  Your next appointment:   6 month(s)  The format for your next appointment:   In Person  Provider:   Weston Brass, MD  Other instruction  You have cardiac clearance for left knee surgery  Can hold plavix for 5 days prior  -the surgeon will give you more details.

## 2019-07-28 ENCOUNTER — Telehealth: Payer: Self-pay | Admitting: Physician Assistant

## 2019-07-28 NOTE — Telephone Encounter (Signed)
Will you let him know [name] [name] is on it.

## 2019-07-28 NOTE — Telephone Encounter (Signed)
LMOM for patient letting him know Charlton Amor is working on his surgery

## 2019-07-28 NOTE — Telephone Encounter (Signed)
Pt called in said gil sent him to get clearance from his heart doctor in order to have surgery scheduled for his left knee, pt states the results from his doctor have been faxed over to [place] and his wondering if he can go ahead and get surgery scheduled?    [phone]

## 2019-07-31 ENCOUNTER — Encounter (HOSPITAL_BASED_OUTPATIENT_CLINIC_OR_DEPARTMENT_OTHER): Payer: Self-pay | Admitting: Orthopaedic Surgery

## 2019-07-31 ENCOUNTER — Encounter (HOSPITAL_BASED_OUTPATIENT_CLINIC_OR_DEPARTMENT_OTHER)
Admission: RE | Admit: 2019-07-31 | Discharge: 2019-07-31 | Disposition: A | Discharge status: Home / Self Care | Payer: No Typology Code available for payment source | Source: Ambulatory Visit | Attending: Orthopaedic Surgery | Admitting: Orthopaedic Surgery

## 2019-07-31 ENCOUNTER — Other Ambulatory Visit: Payer: Self-pay | Admitting: Physician Assistant

## 2019-07-31 ENCOUNTER — Other Ambulatory Visit (HOSPITAL_COMMUNITY)
Admission: RE | Admit: 2019-07-31 | Discharge: 2019-07-31 | Disposition: A | Discharge status: Home / Self Care | Payer: No Typology Code available for payment source | Source: Ambulatory Visit | Attending: Orthopaedic Surgery | Admitting: Orthopaedic Surgery

## 2019-07-31 ENCOUNTER — Other Ambulatory Visit: Payer: Self-pay

## 2019-07-31 ENCOUNTER — Telehealth: Payer: Self-pay

## 2019-07-31 DIAGNOSIS — Z01812 Encounter for preprocedural laboratory examination: Secondary | ICD-10-CM | POA: Diagnosis present

## 2019-07-31 DIAGNOSIS — Z20822 Contact with and (suspected) exposure to covid-19: Secondary | ICD-10-CM | POA: Insufficient documentation

## 2019-07-31 LAB — BASIC METABOLIC PANEL
Anion gap: 10 (ref 5–15)
BUN: 17 mg/dL (ref 8–23)
CO2: 28 mmol/L (ref 22–32)
Calcium: 9.3 mg/dL (ref 8.9–10.3)
Chloride: 102 mmol/L (ref 98–111)
Creatinine, Ser: 0.72 mg/dL (ref 0.61–1.24)
GFR calc Af Amer: >60 mL/min (ref >60)
GFR calc non Af Amer: >60 mL/min (ref >60)
Glucose, Bld: 98 mg/dL (ref 70–99)
Potassium: 4.9 mmol/L (ref 3.5–5.1)
Sodium: 140 mmol/L (ref 135–145)

## 2019-07-31 LAB — SARS CORONAVIRUS 2 (TAT 6-24 HRS): SARS Coronavirus 2: NEGATIVE

## 2019-07-31 NOTE — Telephone Encounter (Signed)
error 

## 2019-08-01 ENCOUNTER — Telehealth: Payer: Self-pay | Admitting: Internal Medicine

## 2019-08-01 NOTE — Telephone Encounter (Signed)
Faxed over office note, as they are who referred patient to office.

## 2019-08-01 NOTE — Telephone Encounter (Signed)
New Message  Misty from the [facility] is calling in to have visit notes faxed over from visit patient had with Dr. Jacques Navy on 07/25/19. Please assist.   [facility] Fax : [phone]

## 2019-08-04 ENCOUNTER — Other Ambulatory Visit: Payer: Self-pay

## 2019-08-04 ENCOUNTER — Encounter (HOSPITAL_BASED_OUTPATIENT_CLINIC_OR_DEPARTMENT_OTHER)
Admission: RE | Disposition: A | Discharge status: Home / Self Care | Payer: Self-pay | Source: Home / Self Care | Attending: Orthopaedic Surgery

## 2019-08-04 ENCOUNTER — Ambulatory Visit (HOSPITAL_BASED_OUTPATIENT_CLINIC_OR_DEPARTMENT_OTHER): Payer: No Typology Code available for payment source | Admitting: Certified Registered"

## 2019-08-04 ENCOUNTER — Ambulatory Visit (HOSPITAL_BASED_OUTPATIENT_CLINIC_OR_DEPARTMENT_OTHER)
Admission: RE | Admit: 2019-08-04 | Discharge: 2019-08-04 | Disposition: A | Discharge status: Home / Self Care | Payer: No Typology Code available for payment source | Attending: Orthopaedic Surgery | Admitting: Orthopaedic Surgery

## 2019-08-04 ENCOUNTER — Encounter (HOSPITAL_BASED_OUTPATIENT_CLINIC_OR_DEPARTMENT_OTHER): Payer: Self-pay | Admitting: Orthopaedic Surgery

## 2019-08-04 DIAGNOSIS — S83242A Other tear of medial meniscus, current injury, left knee, initial encounter: Secondary | ICD-10-CM | POA: Insufficient documentation

## 2019-08-04 DIAGNOSIS — I252 Old myocardial infarction: Secondary | ICD-10-CM | POA: Diagnosis not present

## 2019-08-04 DIAGNOSIS — Z8673 Personal history of transient ischemic attack (TIA), and cerebral infarction without residual deficits: Secondary | ICD-10-CM | POA: Insufficient documentation

## 2019-08-04 DIAGNOSIS — S83232D Complex tear of medial meniscus, current injury, left knee, subsequent encounter: Secondary | ICD-10-CM | POA: Diagnosis not present

## 2019-08-04 DIAGNOSIS — Z79899 Other long term (current) drug therapy: Secondary | ICD-10-CM | POA: Insufficient documentation

## 2019-08-04 DIAGNOSIS — Z8249 Family history of ischemic heart disease and other diseases of the circulatory system: Secondary | ICD-10-CM | POA: Diagnosis not present

## 2019-08-04 DIAGNOSIS — Z7982 Long term (current) use of aspirin: Secondary | ICD-10-CM | POA: Diagnosis not present

## 2019-08-04 DIAGNOSIS — E7800 Pure hypercholesterolemia, unspecified: Secondary | ICD-10-CM | POA: Diagnosis not present

## 2019-08-04 DIAGNOSIS — X501XXA Overexertion from prolonged static or awkward postures, initial encounter: Secondary | ICD-10-CM | POA: Diagnosis not present

## 2019-08-04 DIAGNOSIS — G4730 Sleep apnea, unspecified: Secondary | ICD-10-CM | POA: Diagnosis not present

## 2019-08-04 DIAGNOSIS — I10 Essential (primary) hypertension: Secondary | ICD-10-CM | POA: Insufficient documentation

## 2019-08-04 DIAGNOSIS — Z881 Allergy status to other antibiotic agents status: Secondary | ICD-10-CM | POA: Insufficient documentation

## 2019-08-04 DIAGNOSIS — I2510 Atherosclerotic heart disease of native coronary artery without angina pectoris: Secondary | ICD-10-CM | POA: Insufficient documentation

## 2019-08-04 DIAGNOSIS — Z87891 Personal history of nicotine dependence: Secondary | ICD-10-CM | POA: Insufficient documentation

## 2019-08-04 DIAGNOSIS — Z7902 Long term (current) use of antithrombotics/antiplatelets: Secondary | ICD-10-CM | POA: Diagnosis not present

## 2019-08-04 DIAGNOSIS — Z885 Allergy status to narcotic agent status: Secondary | ICD-10-CM | POA: Insufficient documentation

## 2019-08-04 DIAGNOSIS — F4310 Post-traumatic stress disorder, unspecified: Secondary | ICD-10-CM | POA: Insufficient documentation

## 2019-08-04 DIAGNOSIS — S83232A Complex tear of medial meniscus, current injury, left knee, initial encounter: Secondary | ICD-10-CM

## 2019-08-04 DIAGNOSIS — F329 Major depressive disorder, single episode, unspecified: Secondary | ICD-10-CM | POA: Diagnosis not present

## 2019-08-04 HISTORY — PX: KNEE ARTHROSCOPY: SHX127

## 2019-08-04 SURGERY — ARTHROSCOPY, KNEE
Anesthesia: General | Site: Knee | Laterality: Left

## 2019-08-04 MED ORDER — MIDAZOLAM HCL 2 MG/2ML IJ SOLN: 1.0000 mg | INTRAMUSCULAR | Status: DC | PRN

## 2019-08-04 MED ORDER — BUPIVACAINE HCL (PF) 0.5 % IJ SOLN
INTRAMUSCULAR | Status: AC
  Filled 2019-08-04: qty 30

## 2019-08-04 MED ORDER — LACTATED RINGERS IV SOLN: INTRAVENOUS | Status: DC | PRN

## 2019-08-04 MED ORDER — OXYCODONE HCL 5 MG/5ML PO SOLN: 5.0000 mg | Freq: Once | ORAL | Status: DC | PRN

## 2019-08-04 MED ORDER — LIDOCAINE HCL (CARDIAC) PF 100 MG/5ML IV SOSY
PREFILLED_SYRINGE | INTRAVENOUS | Status: DC | PRN
  Administered 2019-08-04: 100 mg via INTRAVENOUS

## 2019-08-04 MED ORDER — FENTANYL CITRATE (PF) 100 MCG/2ML IJ SOLN
INTRAMUSCULAR | Status: AC
  Filled 2019-08-04: qty 2

## 2019-08-04 MED ORDER — ONDANSETRON HCL 4 MG/2ML IJ SOLN
INTRAMUSCULAR | Status: DC | PRN
  Administered 2019-08-04: 4 mg via INTRAVENOUS

## 2019-08-04 MED ORDER — FENTANYL CITRATE (PF) 100 MCG/2ML IJ SOLN: 50.0000 ug | INTRAMUSCULAR | Status: DC | PRN

## 2019-08-04 MED ORDER — HYDROCODONE-ACETAMINOPHEN 5-325 MG PO TABS: 1.0000 | ORAL_TABLET | ORAL | 0 refills | Status: DC | PRN

## 2019-08-04 MED ORDER — LACTATED RINGERS IV SOLN: INTRAVENOUS | Status: DC

## 2019-08-04 MED ORDER — DEXAMETHASONE SODIUM PHOSPHATE 10 MG/ML IJ SOLN
INTRAMUSCULAR | Status: DC | PRN
  Administered 2019-08-04: 10 mg via INTRAVENOUS

## 2019-08-04 MED ORDER — MIDAZOLAM HCL 2 MG/2ML IJ SOLN
INTRAMUSCULAR | Status: AC
  Filled 2019-08-04: qty 2

## 2019-08-04 MED ORDER — CHLORHEXIDINE GLUCONATE 4 % EX LIQD: 60.0000 mL | Freq: Once | CUTANEOUS | Status: DC

## 2019-08-04 MED ORDER — CEFAZOLIN SODIUM-DEXTROSE 2-4 GM/100ML-% IV SOLN
2.0000 g | INTRAVENOUS | Status: AC
  Administered 2019-08-04: 13:00:00 2 g via INTRAVENOUS

## 2019-08-04 MED ORDER — CEFAZOLIN SODIUM-DEXTROSE 2-3 GM-%(50ML) IV SOLR
INTRAVENOUS | Status: DC | PRN
  Administered 2019-08-04: 2 g via INTRAVENOUS

## 2019-08-04 MED ORDER — FENTANYL CITRATE (PF) 100 MCG/2ML IJ SOLN
25.0000 ug | INTRAMUSCULAR | Status: DC | PRN
  Administered 2019-08-04: 25 ug via INTRAVENOUS

## 2019-08-04 MED ORDER — PROPOFOL 10 MG/ML IV BOLUS
INTRAVENOUS | Status: DC | PRN
  Administered 2019-08-04: 100 mg via INTRAVENOUS

## 2019-08-04 MED ORDER — ONDANSETRON 4 MG PO TBDP: 4.0000 mg | ORAL_TABLET | Freq: Three times a day (TID) | ORAL | 0 refills | Status: DC | PRN

## 2019-08-04 MED ORDER — BUPIVACAINE HCL (PF) 0.5 % IJ SOLN
INTRAMUSCULAR | Status: DC | PRN
  Administered 2019-08-04: 20 mL

## 2019-08-04 MED ORDER — DEXMEDETOMIDINE HCL IN NACL 200 MCG/50ML IV SOLN
INTRAVENOUS | Status: AC
  Filled 2019-08-04: qty 50

## 2019-08-04 MED ORDER — FENTANYL CITRATE (PF) 100 MCG/2ML IJ SOLN
INTRAMUSCULAR | Status: DC | PRN
  Administered 2019-08-04: 100 ug via INTRAVENOUS

## 2019-08-04 MED ORDER — ONDANSETRON HCL 4 MG/2ML IJ SOLN: 4.0000 mg | Freq: Once | INTRAMUSCULAR | Status: DC | PRN

## 2019-08-04 MED ORDER — EPHEDRINE SULFATE 50 MG/ML IJ SOLN
INTRAMUSCULAR | Status: DC | PRN
  Administered 2019-08-04: 20 mg via INTRAVENOUS

## 2019-08-04 MED ORDER — OXYCODONE HCL 5 MG PO TABS: 5.0000 mg | ORAL_TABLET | Freq: Once | ORAL | Status: DC | PRN

## 2019-08-04 MED ORDER — LIDOCAINE HCL (PF) 1 % IJ SOLN
INTRAMUSCULAR | Status: AC
  Filled 2019-08-04: qty 30

## 2019-08-04 MED ORDER — CEFAZOLIN SODIUM-DEXTROSE 2-4 GM/100ML-% IV SOLN
INTRAVENOUS | Status: AC
  Filled 2019-08-04: qty 100

## 2019-08-04 SURGICAL SUPPLY — 37 items
BLADE EXCALIBUR 4.0MM X 13CM (MISCELLANEOUS)
BLADE EXCALIBUR 4.0X13 (MISCELLANEOUS) IMPLANT
BNDG ELASTIC 6X5.8 VLCR STR LF (GAUZE/BANDAGES/DRESSINGS) ×3 IMPLANT
COVER WAND RF STERILE (DRAPES) IMPLANT
DISSECTOR  3.8MM X 13CM (MISCELLANEOUS) ×2
DISSECTOR 3.8MM X 13CM (MISCELLANEOUS) ×1 IMPLANT
DRAPE ARTHROSCOPY W/POUCH 90 (DRAPES) ×3 IMPLANT
DRAPE U-SHAPE 47X51 STRL (DRAPES) ×3 IMPLANT
DRSG PAD ABDOMINAL 8X10 ST (GAUZE/BANDAGES/DRESSINGS) ×3 IMPLANT
DURAPREP 26ML APPLICATOR (WOUND CARE) ×3 IMPLANT
ELECT MENISCUS 165MM 90D (ELECTRODE) IMPLANT
ELECT REM PT RETURN 9FT ADLT (ELECTROSURGICAL)
ELECTRODE REM PT RTRN 9FT ADLT (ELECTROSURGICAL) IMPLANT
EXCALIBUR 3.8MM X 13CM (MISCELLANEOUS) IMPLANT
GAUZE SPONGE 4X4 12PLY STRL (GAUZE/BANDAGES/DRESSINGS) ×3 IMPLANT
GAUZE XEROFORM 1X8 LF (GAUZE/BANDAGES/DRESSINGS) ×3 IMPLANT
GLOVE BIOGEL PI IND STRL 7.0 (GLOVE) ×2 IMPLANT
GLOVE BIOGEL PI IND STRL 8 (GLOVE) ×2 IMPLANT
GLOVE BIOGEL PI INDICATOR 7.0 (GLOVE) ×4
GLOVE BIOGEL PI INDICATOR 8 (GLOVE) ×4
GLOVE ECLIPSE 6.5 STRL STRAW (GLOVE) ×3 IMPLANT
GLOVE ORTHO TXT STRL SZ7.5 (GLOVE) ×3 IMPLANT
GLOVE SURG ORTHO 8.0 STRL STRW (GLOVE) ×3 IMPLANT
GOWN STRL REUS W/ TWL LRG LVL3 (GOWN DISPOSABLE) ×2 IMPLANT
GOWN STRL REUS W/ TWL XL LVL3 (GOWN DISPOSABLE) ×1 IMPLANT
GOWN STRL REUS W/TWL LRG LVL3 (GOWN DISPOSABLE) ×4
GOWN STRL REUS W/TWL XL LVL3 (GOWN DISPOSABLE) ×2
KNEE WRAP E Z 3 GEL PACK (MISCELLANEOUS) ×3 IMPLANT
MANIFOLD NEPTUNE II (INSTRUMENTS) ×3 IMPLANT
PACK ARTHROSCOPY DSU (CUSTOM PROCEDURE TRAY) ×3 IMPLANT
PACK BASIN DAY SURGERY FS (CUSTOM PROCEDURE TRAY) ×3 IMPLANT
PADDING CAST COTTON 6X4 STRL (CAST SUPPLIES) ×3 IMPLANT
PENCIL SMOKE EVACUATOR (MISCELLANEOUS) IMPLANT
SUT ETHILON 3 0 PS 1 (SUTURE) ×3 IMPLANT
TOWEL GREEN STERILE FF (TOWEL DISPOSABLE) ×3 IMPLANT
TUBING ARTHROSCOPY IRRIG 16FT (MISCELLANEOUS) ×3 IMPLANT
WATER STERILE IRR 1000ML POUR (IV SOLUTION) ×3 IMPLANT

## 2019-08-04 NOTE — Op Note (Signed)
NAME: [name] [name], [name] [name] MEDICAL RECORD [id number] ACCOUNT [id number] DATE OF BIRTH:December 18, 1945 FACILITY: [facility] LOCATION: [facility] PHYSICIAN:Rydan Gulyas Aretha Parrot, MD  OPERATIVE REPORT  DATE OF PROCEDURE:  08/04/2019  PREOPERATIVE DIAGNOSIS:  Symptomatic left knee complex medial meniscal tear.  POSTOPERATIVE DIAGNOSIS:  Symptomatic left knee complex medial meniscal tear.  PROCEDURE:  Left knee arthroscopy with partial medial meniscectomy.  FINDINGS:   1.  Posterior horn to body complex medial meniscal tear with associated synovitis. 2.  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  SURGEON:  Vanita Panda. Magnus Ivan, MD  ASSISTANT:  Richardean Canal, PA-C.  ANESTHESIA:   1.  General. 2.  Local with 0.25% plain Marcaine into the left knee joint and the incisions.  ANTIBIOTICS:  Two g IV Ancef.  ESTIMATED BLOOD LOSS:  Minimal.  COMPLICATIONS:  None.  INDICATIONS:  The patient is a very active 74 year old gentleman with an acute injury to his left knee.  He works a lot at home and had a twisting injury to that knee and then developed locking and catching after that.  He had not had any problems  before.  We actually performed arthroscopic intervention on his right knee remotely.  After continued pain with locking, catching and intermittent effusions, we did obtain an MRI of his left knee.  This was also after failing conservative treatment,  including rest, ice, heat, anti-inflammatories and steroid injections.  The MRI did show a complex medial meniscal tear with no significant cartilage wear in his knee.  At this point, given his continued symptoms, we have recommended arthroscopic  intervention and he is in agreement with this  considering the success of his arthroscopy on his right knee and his continued symptoms.  We spent a long time discussing the risks and benefits of surgery and then had cardiac clearance obtained as  well   given his age and multiple comorbidities.  DESCRIPTION OF PROCEDURE:  After informed consent was obtained and appropriate left knee was marked, he was brought to the operating room and placed supine on the operating table.  General anesthesia was then obtained.  His left thigh, knee, leg and  ankle were prepped and draped with DuraPrep and sterile drapes including a sterile stockinette.  A lateral leg post was utilized as well.  With the bed raised and the left knee flexed off the side of the table, a time-out was called and he was identified  as correct patient, correct left knee.  I then made an anterolateral arthroscopy portal and inserted a cannula in the knee.  We went to the medial side of the knee and made an anterior medial incision.  We were able to probe the medial meniscus and did  find a complex tear from the posterior horn to midbody.  There were no areas of full thickness cartilage loss at all at the trochlear groove or the medial femoral condyle, but there was definitely thinning of the cartilage.  The ACL and PCL were intact  and with the knee in a figure-of-four position, the lateral meniscus just shows some degenerative fraying, but overall was intact with normal cartilage on the lateral compartment of the knee.  Using arthroscopic shaver and basket forceps, we were able to  perform a partial medial meniscectomy, trimming this back to a stable margin.  We probed the meniscus after that and it was stable.  We then allowed fluid lavage to the knee.  We did remove some synovium in the  suprapatellar area of the knee.  After we  lavaged all the fluid through the knee, we removed all the fluid from the knee and then removed all instrumentation, we closed the portal sites with interrupted nylon suture.  We did insert plain Marcaine in the joint and the incisions.  A well-padded  sterile dressing was applied.  He was awakened, extubated and taken to the recovery room in stable condition.   All final counts were correct.  There were no complications noted.  Postoperatively, we will allow him to weightbear as tolerated with slow  increase of his activities.  We will give him wound care instructions and followup in the office in a week.  He will be discharged from the recovery room.  VN/NUANCE  D:08/04/2019 T:08/04/2019 JOB:[id number]

## 2019-08-04 NOTE — Transfer of Care (Signed)
Immediate Anesthesia Transfer of Care Note  Patient: [name] [name]  Procedure(s) Performed: LEFT KNEE ARTHROSCOPY WITH PARTIAL MEDIAL MENISCECTOMY (Left Knee)  Patient Location: PACU  Anesthesia Type:General  Level of Consciousness: awake, alert  and oriented  Airway & Oxygen Therapy: Patient Spontanous Breathing and Patient connected to face mask oxygen  Post-op Assessment: Report given to RN and Post -op Vital signs reviewed and stable  Post vital signs: Reviewed and stable  Last Vitals:  Vitals Value Taken Time  BP    Temp    Pulse    Resp    SpO2      Last Pain:  Vitals:   08/04/19 1109  TempSrc: Temporal  PainSc: 0-No pain      Patients Stated Pain Goal: 3 (08/04/19 1109)  Complications: No apparent anesthesia complications

## 2019-08-04 NOTE — Brief Op Note (Signed)
08/04/2019  1:09 PM  PATIENT:  [name] [name]  74 y.o. male  PRE-OPERATIVE DIAGNOSIS:  left knee medial meniscal tear  POST-OPERATIVE DIAGNOSIS:  left knee medial meniscal tear  PROCEDURE:  Procedure(s): LEFT KNEE ARTHROSCOPY WITH PARTIAL MEDIAL MENISCECTOMY (Left)  SURGEON:  Surgeon(s) and Role:    Kathryne Hitch, MD - Primary  PHYSICIAN ASSISTANT:  Rexene Edison, PA-C  ANESTHESIA:   local and general  COUNTS:  YES  DICTATION: .Other Dictation: Dictation Number [id number]  PLAN OF CARE: Discharge to home after PACU  PATIENT DISPOSITION:  PACU - hemodynamically stable.   Delay start of Pharmacological VTE agent (>24hrs) due to surgical blood loss or risk of bleeding: no

## 2019-08-04 NOTE — Discharge Instructions (Signed)
Increase her activities as comfort allows. Expect left knee swelling and pain. Ice and elevation intermittently to treat the swelling. You may put all of your weight as tolerated on her left lower extremity. You may remove your dressings as soon as tomorrow (February 9). After you remove your dressings you may shower daily getting your incisions wet. After each shower you can place small Band-Aids over your incisions daily. You may resume all of your home medications.   Post Anesthesia Home Care Instructions  Activity: Get plenty of rest for the remainder of the day. A responsible individual must stay with you for 24 hours following the procedure.  For the next 24 hours, DO NOT: -Drive a car -Advertising copywriter -Drink alcoholic beverages -Take any medication unless instructed by your physician -Make any legal decisions or sign important papers.  Meals: Start with liquid foods such as gelatin or soup. Progress to regular foods as tolerated. Avoid greasy, spicy, heavy foods. If nausea and/or vomiting occur, drink only clear liquids until the nausea and/or vomiting subsides. Call your physician if vomiting continues.  Special Instructions/Symptoms: Your throat may feel dry or sore from the anesthesia or the breathing tube placed in your throat during surgery. If this causes discomfort, gargle with warm salt water. The discomfort should disappear within 24 hours.  If you had a scopolamine patch placed behind your ear for the management of post- operative nausea and/or vomiting:  1. The medication in the patch is effective for 72 hours, after which it should be removed.  Wrap patch in a tissue and discard in the trash. Wash hands thoroughly with soap and water. 2. You may remove the patch earlier than 72 hours if you experience unpleasant side effects which may include dry mouth, dizziness or visual disturbances. 3. Avoid touching the patch. Wash your hands with soap and water after contact  with the patch.

## 2019-08-04 NOTE — Anesthesia Procedure Notes (Signed)
Procedure Name: LMA Insertion Performed by: Brook Mall M, CRNA Pre-anesthesia Checklist: Patient identified, Emergency Drugs available, Suction available and Patient being monitored Patient Re-evaluated:Patient Re-evaluated prior to induction Oxygen Delivery Method: Circle system utilized Preoxygenation: Pre-oxygenation with 100% oxygen Induction Type: IV induction Ventilation: Mask ventilation without difficulty LMA: LMA inserted LMA Size: 5.0 Number of attempts: 1 Airway Equipment and Method: Bite block Placement Confirmation: positive ETCO2 Tube secured with: Tape Dental Injury: Teeth and Oropharynx as per pre-operative assessment        

## 2019-08-04 NOTE — H&P (Signed)
[name] [name] is an 74 y.o. male.   Chief Complaint:   Left knee pain with swelling and catching HPI: The patient is a 74 year old gentleman well-known to me.  He has a symptomatic meniscal tear involving his left knee.  A MRI confirmed this.  There is only mild cartilage changes in the knee.  He has had a successful right knee arthroscopy in the past.  We did try conservative treatment for his left knee including strengthening exercises, anti-inflammatories and steroid injections.  Given his continued mechanical symptoms we have elected to proceed with an arthroscopic intervention.  We did get medical clearance for the surgery as well.  This is being done as an outpatient today.  Past Medical History:  Diagnosis Date  . Anxiety   . Carpal tunnel syndrome   . Coronary artery disease   . Depression   . Hepatitis   . Hypercholesteremia   . Hypertension   . Myocardial infarction (HCC) 2010ish  . Pneumonia   . PTSD (post-traumatic stress disorder)   . PTSD (post-traumatic stress disorder)   . Sleep apnea     doesnt use CPAP  . Stroke (HCC)    slight weakness left- 85- 90 % returned    Past Surgical History:  Procedure Laterality Date  . CARDIAC CATHETERIZATION    . CARPAL TUNNEL RELEASE Right 06/10/2013   Procedure: RIGHT CARPAL TUNNEL RELEASE;  Surgeon: Kathryne Hitch, MD;  Location: [facility] OR;  Service: Orthopedics;  Laterality: Right;  . CORONARY ANGIOPLASTY    . EYE SURGERY Bilateral    cataract  . KNEE ARTHROSCOPY Right   . NASAL SINUS SURGERY    . stents  2012    Family History  Problem Relation Age of Onset  . Heart disease Mother        Several other maternal family members  . Lung cancer Father        small cell CA/smoked   Social History:  reports that he quit smoking about 31 years ago. His smoking use included cigarettes. He has a 40.00 pack-year smoking history. He has never used smokeless tobacco. He reports that he does not drink alcohol or use  drugs.  Allergies:  Allergies  Allergen Reactions  . Dilaudid [Hydromorphone Hcl] Other (See Comments)    "CRASHES" per patient Talked to nurse and she said pt is afraid to take dilaudid because pt states he flat-lined when they gave it to him.[name] KitchenMarland KitchenPt doesn't mind taking Percocet   . Clindamycin/Lincomycin Other (See Comments)    Blisters in mouth    Medications Prior to Admission  Medication Sig Dispense Refill  . aspirin EC 81 MG tablet Take 81 mg by mouth daily.    [name] [name] atorvastatin (LIPITOR) 80 MG tablet Take 40 mg by mouth daily.    . clonazePAM (KLONOPIN) 1 MG tablet Take 1 mg by mouth at bedtime.     . gabapentin (NEURONTIN) 300 MG capsule Take 300 mg by mouth at bedtime.    [name] [name] lisinopril (PRINIVIL,ZESTRIL) 10 MG tablet Take 5 mg by mouth daily.    . Multiple Vitamin (MULTIVITAMIN) tablet Take 1 tablet by mouth daily.    . sertraline (ZOLOFT) 100 MG tablet Take 200 mg by mouth daily.    [name] [name] albuterol (PROVENTIL HFA;VENTOLIN HFA) 108 (90 Base) MCG/ACT inhaler Inhale 1-2 puffs into the lungs every 6 (six) hours as needed for wheezing or shortness of breath. (Patient not taking: Reported on 07/25/2019) 1 Inhaler 0  . clopidogrel (PLAVIX) 75 MG tablet  Take 75 mg by mouth daily.      . predniSONE (DELTASONE) 50 MG tablet Take 1 tablet (50 mg total) by mouth daily with breakfast. (Patient not taking: Reported on 07/25/2019) 5 tablet 0    No results found for this or any previous visit (from the past 48 hour(s)). No results found.  Review of Systems  Musculoskeletal: Positive for joint swelling.  All other systems reviewed and are negative.   Blood pressure (!) 135/93, pulse 65, temperature (!) 97.5 F (36.4 C), temperature source Temporal, resp. rate 18, height 5\' 10"  (1.778 m), weight 97.1 kg, SpO2 99 %. Physical Exam  Constitutional: He is oriented to person, place, and time. He appears well-developed and well-nourished.  HENT:  Head: Normocephalic and atraumatic.  Eyes: Pupils are  equal, round, and reactive to light. EOM are normal.  Cardiovascular: Normal rate.  Respiratory: Effort normal.  GI: Soft.  Musculoskeletal:     Cervical back: Normal range of motion and neck supple.     Left knee: Swelling present. Decreased range of motion. Tenderness present over the medial joint line. Abnormal meniscus.  Neurological: He is alert and oriented to person, place, and time.  Skin: Skin is warm and dry.  Psychiatric: He has a normal mood and affect.     Assessment/Plan Left knee symptomatic meniscal tear  The patient understands fully that we are proceeding to surgery today for an intervention with his left knee.  He has had arthroscopic intervention before on the right knee so he is fully aware of our goals of surgery as well as the risk and benefits involved.  All question concerns have been answered and addressed and informed consent is obtained.  The left knee has been marked.  Mcarthur Rossetti, MD 08/04/2019, 12:18 PM

## 2019-08-04 NOTE — Anesthesia Preprocedure Evaluation (Addendum)
Anesthesia Evaluation  Patient identified by MRN, date of birth, ID band Patient awake    Reviewed: Allergy & Precautions, NPO status , Patient's Chart, lab work & pertinent test results  History of Anesthesia Complications Negative for: history of anesthetic complications  Airway Mallampati: II  TM Distance: >3 FB Neck ROM: Full    Dental  (+) Edentulous Upper, Partial Lower   Pulmonary sleep apnea , former smoker,    Pulmonary exam normal        Cardiovascular hypertension, + CAD, + Past MI and + Cardiac Stents  Normal cardiovascular exam     Neuro/Psych PSYCHIATRIC DISORDERS Anxiety Depression TIACVA, Residual Symptoms    GI/Hepatic negative GI ROS, Neg liver ROS,   Endo/Other  negative endocrine ROS  Renal/GU negative Renal ROS  negative genitourinary   Musculoskeletal negative musculoskeletal ROS (+)   Abdominal   Peds  Hematology negative hematology ROS (+)   Anesthesia Other Findings Preoperative cardiovascular evaluation-the patient is overall intermediate risk for low risk procedure.  He is intermediate risk given his history of coronary artery disease requiring revascularization with PCI and his history of TIAs requiring indefinite Plavix therapy.  Fortunately he is asymptomatic from a cardiopulmonary standpoint and requires no further cardiovascular testing prior to left knee surgery for his meniscus.  If this level of risk is deemed acceptable by the patient and surgical team, the patient should be considered optimized from a cardiovascular standpoint.  Reproductive/Obstetrics                            Anesthesia Physical Anesthesia Plan  ASA: III  Anesthesia Plan: General   Post-op Pain Management:    Induction: Intravenous  PONV Risk Score and Plan: 2 and Ondansetron, Dexamethasone and Treatment may vary due to age or medical condition  Airway Management Planned:  LMA  Additional Equipment: None  Intra-op Plan:   Post-operative Plan: Extubation in OR  Informed Consent: I have reviewed the patients History and Physical, chart, labs and discussed the procedure including the risks, benefits and alternatives for the proposed anesthesia with the patient or authorized representative who has indicated his/her understanding and acceptance.     Dental advisory given  Plan Discussed with:   Anesthesia Plan Comments:         Anesthesia Quick Evaluation

## 2019-08-05 ENCOUNTER — Encounter: Payer: Self-pay | Admitting: *Deleted

## 2019-08-05 NOTE — Anesthesia Postprocedure Evaluation (Signed)
Anesthesia Post Note  Patient: MATHEWS STUHR  Procedure(s) Performed: LEFT KNEE ARTHROSCOPY WITH PARTIAL MEDIAL MENISCECTOMY (Left Knee)     Patient location during evaluation: PACU Anesthesia Type: General Level of consciousness: awake and alert Pain management: pain level controlled Vital Signs Assessment: post-procedure vital signs reviewed and stable Respiratory status: spontaneous breathing, nonlabored ventilation and respiratory function stable Cardiovascular status: blood pressure returned to baseline and stable Postop Assessment: no apparent nausea or vomiting Anesthetic complications: no    Last Vitals:  Vitals:   08/04/19 1345 08/04/19 1400  BP: (!) 151/87 (!) 141/84  Pulse: 64 63  Resp: 13 16  Temp:  36.7 C  SpO2: 100% 95%    Last Pain:  Vitals:   08/04/19 1400  TempSrc:   PainSc: 2                  Lucretia Kern

## 2019-08-12 ENCOUNTER — Other Ambulatory Visit: Payer: Self-pay

## 2019-08-12 ENCOUNTER — Encounter: Payer: Self-pay | Admitting: Orthopaedic Surgery

## 2019-08-12 ENCOUNTER — Ambulatory Visit (INDEPENDENT_AMBULATORY_CARE_PROVIDER_SITE_OTHER): Payer: No Typology Code available for payment source | Admitting: Orthopaedic Surgery

## 2019-08-12 DIAGNOSIS — Z9889 Other specified postprocedural states: Secondary | ICD-10-CM | POA: Insufficient documentation

## 2019-08-12 NOTE — Progress Notes (Signed)
The patient comes in today a week after a left knee arthroscopy with partial medial meniscectomy.  He had some grade 2 to grade 3 changes of the medial femoral condyle in terms of cartilage.  He is using a cane he has had a lot of bruising and swelling in his knee but overall feels like he is increasing his mobility is much as he can.  On exam he does have a significant thigh bruising medially.  There is a mild effusion of his left knee.  I did remove the sutures from his left knee and went over his arthroscopy pictures with him.  His calf is soft.  He has got some limitations in motion of his left knee due to the effusion.  He does not wish to have this aspirated today.  At this point he will continue to increase his activities as comfort allows.  We will see him back in 4 weeks and I prepared him for the potential for aspirating his knee and trying a steroid injection then and then considering him to be a good candidate for hyaluronic acid in the future.  All question concerns were answered and addressed.  We will see him back in 4 weeks.

## 2019-09-09 ENCOUNTER — Other Ambulatory Visit: Payer: Self-pay

## 2019-09-09 ENCOUNTER — Ambulatory Visit (INDEPENDENT_AMBULATORY_CARE_PROVIDER_SITE_OTHER): Payer: No Typology Code available for payment source | Admitting: Orthopaedic Surgery

## 2019-09-09 ENCOUNTER — Telehealth: Payer: Self-pay

## 2019-09-09 ENCOUNTER — Encounter: Payer: Self-pay | Admitting: Orthopaedic Surgery

## 2019-09-09 DIAGNOSIS — Z9889 Other specified postprocedural states: Secondary | ICD-10-CM

## 2019-09-09 DIAGNOSIS — M25562 Pain in left knee: Secondary | ICD-10-CM | POA: Diagnosis not present

## 2019-09-09 MED ORDER — LIDOCAINE HCL 1 % IJ SOLN
3.0000 mL | INTRAMUSCULAR | Status: AC | PRN
  Administered 2019-09-09: 3 mL

## 2019-09-09 MED ORDER — METHYLPREDNISOLONE ACETATE 40 MG/ML IJ SUSP
40.0000 mg | INTRAMUSCULAR | Status: AC | PRN
  Administered 2019-09-09: 40 mg via INTRA_ARTICULAR

## 2019-09-09 NOTE — Telephone Encounter (Signed)
Please advise on how to proceed with this insurance

## 2019-09-09 NOTE — Progress Notes (Signed)
   Procedure Note  Patient: [name] [name]             Date of Birth: 01/26/1946           MRN: [id number]             Visit Date: 09/09/2019  Procedures: Visit Diagnoses:  1. Status post arthroscopy of left knee     Large Joint Inj on 09/09/2019 9:21 AM Indications: diagnostic evaluation and pain Details: 22 G 1.5 in needle, superolateral approach  Arthrogram: No  Medications: 3 mL lidocaine 1 %; 40 mg methylPREDNISolone acetate 40 MG/ML Outcome: tolerated well, no immediate complications Procedure, treatment alternatives, risks and benefits explained, specific risks discussed. Consent was given by the patient. Immediately prior to procedure a time out was called to verify the correct patient, procedure, equipment, support staff and site/side marked as required. Patient was prepped and draped in the usual sterile fashion.    The patient is continuing to recover from a left knee arthroscopy.  He is 74 years old.  He is 5 weeks out from the surgery.  He still has symptoms of feel like the knees will give way and it does hurt when he has it in a dependent position and he gets of the foot weight on the knee.  He did have a meniscal tear the time of surgery and some thinning of the articular cartilage of his knee.  On examination today the left knee does still show a mild effusion.  He has a painful arc of motion of the knee but it is ligamentously stable.  I do feel that he would benefit from an aspiration of that knee today as well as a steroid injection.  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  I want to talk to him in length about the possibility of physical therapy on his knee to strengthen the muscles around the knee.  I was able to easily aspirate 40 cc of fluid from his left knee and placed a steroid injection in the knee which he tolerated well.  He is a perfect candidate for outpatient physical therapy for his left  knee to strengthen the muscles around the knee since he is very active and works a lot outdoors.  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  All question concerns were answered and addressed.  We will see him back in 4 weeks.

## 2019-09-09 NOTE — Telephone Encounter (Signed)
Per [place] F[employer] Covered @ 100%

## 2019-09-09 NOTE — Telephone Encounter (Signed)
Left knee gel injection ?

## 2019-09-09 NOTE — Telephone Encounter (Signed)
Approved for Gel One-Left knee Dr. Magnus Ivan

## 2019-09-09 NOTE — Telephone Encounter (Signed)
Sending you an email to show you. Per [place] auth injections are covered.

## 2019-09-10 NOTE — Telephone Encounter (Signed)
New start

## 2019-09-10 NOTE — Telephone Encounter (Signed)
Lvm for pt to call back to schedule...ok to schedule at next available

## 2019-09-11 NOTE — Telephone Encounter (Signed)
Left another voice to inform pt of approval. Pt already has appointment for 4/20. This was added to the notes.

## 2019-09-12 ENCOUNTER — Telehealth: Payer: Self-pay | Admitting: Orthopaedic Surgery

## 2019-09-12 NOTE — Telephone Encounter (Signed)
Thank you :)

## 2019-09-12 NOTE — Telephone Encounter (Signed)
Pt called returning Autumns call, I made pt aware of his approval and I informed him of his appt on 4/20 @ 9:30.

## 2019-09-16 ENCOUNTER — Ambulatory Visit: Payer: No Typology Code available for payment source | Admitting: Physical Therapy

## 2019-10-14 ENCOUNTER — Ambulatory Visit: Payer: No Typology Code available for payment source | Admitting: Orthopaedic Surgery

## 2019-12-25 ENCOUNTER — Telehealth: Payer: Self-pay | Admitting: *Deleted

## 2019-12-25 NOTE — Telephone Encounter (Signed)
A message was left, re: his follow up visit. 

## 2020-02-09 ENCOUNTER — Ambulatory Visit: Payer: No Typology Code available for payment source | Admitting: Internal Medicine

## 2020-03-25 ENCOUNTER — Encounter: Payer: Self-pay | Admitting: General Practice

## 2020-04-10 ENCOUNTER — Ambulatory Visit: Payer: No Typology Code available for payment source | Attending: Internal Medicine

## 2020-04-10 ENCOUNTER — Other Ambulatory Visit: Payer: Self-pay

## 2020-04-10 DIAGNOSIS — Z23 Encounter for immunization: Secondary | ICD-10-CM

## 2020-04-10 NOTE — Progress Notes (Signed)
   Covid-19 Vaccination Clinic  Name:  [name] [name]    MRN: [id number] DOB: March 14, 1946  04/10/2020  [name] was observed post Covid-19 immunization for 15 minutes without incident. He was provided with Vaccine Information Sheet and instruction to access the V-Safe system.   [name] was instructed to call 911 with any severe reactions post vaccine: Marland Kitchen Difficulty breathing  . Swelling of face and throat  . A fast heartbeat  . A bad rash all over body  . Dizziness and weakness

## 2020-06-28 ENCOUNTER — Emergency Department (HOSPITAL_COMMUNITY)
Admission: EM | Admit: 2020-06-28 | Discharge: 2020-06-28 | Disposition: A | Discharge status: Home / Self Care | Payer: No Typology Code available for payment source | Attending: Emergency Medicine | Admitting: Emergency Medicine

## 2020-06-28 ENCOUNTER — Other Ambulatory Visit: Payer: Self-pay

## 2020-06-28 DIAGNOSIS — Z7902 Long term (current) use of antithrombotics/antiplatelets: Secondary | ICD-10-CM | POA: Diagnosis not present

## 2020-06-28 DIAGNOSIS — I2510 Atherosclerotic heart disease of native coronary artery without angina pectoris: Secondary | ICD-10-CM | POA: Insufficient documentation

## 2020-06-28 DIAGNOSIS — I10 Essential (primary) hypertension: Secondary | ICD-10-CM | POA: Diagnosis not present

## 2020-06-28 DIAGNOSIS — R42 Dizziness and giddiness: Secondary | ICD-10-CM | POA: Diagnosis present

## 2020-06-28 DIAGNOSIS — Z7982 Long term (current) use of aspirin: Secondary | ICD-10-CM | POA: Insufficient documentation

## 2020-06-28 DIAGNOSIS — Z77028 Contact with and (suspected) exposure to other hazardous aromatic compounds: Secondary | ICD-10-CM | POA: Insufficient documentation

## 2020-06-28 DIAGNOSIS — Z79899 Other long term (current) drug therapy: Secondary | ICD-10-CM | POA: Insufficient documentation

## 2020-06-28 DIAGNOSIS — Z87891 Personal history of nicotine dependence: Secondary | ICD-10-CM | POA: Diagnosis not present

## 2020-06-28 DIAGNOSIS — Z7729 Contact with and (suspected ) exposure to other hazardous substances: Secondary | ICD-10-CM

## 2020-06-28 LAB — COMPREHENSIVE METABOLIC PANEL
ALT: 15 U/L (ref 0–44)
AST: 19 U/L (ref 15–41)
Albumin: 3.8 g/dL (ref 3.5–5.0)
Alkaline Phosphatase: 86 U/L (ref 38–126)
Anion gap: 11 (ref 5–15)
BUN: 22 mg/dL (ref 8–23)
CO2: 25 mmol/L (ref 22–32)
Calcium: 9.1 mg/dL (ref 8.9–10.3)
Chloride: 102 mmol/L (ref 98–111)
Creatinine, Ser: 0.76 mg/dL (ref 0.61–1.24)
GFR, Estimated: >60 mL/min (ref >60)
Glucose, Bld: 119 mg/dL — ABNORMAL HIGH (ref 70–99)
Potassium: 4 mmol/L (ref 3.5–5.1)
Sodium: 138 mmol/L (ref 135–145)
Total Bilirubin: 0.7 mg/dL (ref 0.3–1.2)
Total Protein: 6.6 g/dL (ref 6.5–8.1)

## 2020-06-28 LAB — CBC WITH DIFFERENTIAL/PLATELET
Abs Immature Granulocytes: 0.02 10*3/uL (ref 0.00–0.07)
Basophils Absolute: 0 10*3/uL (ref 0.0–0.1)
Basophils Relative: 1 %
Eosinophils Absolute: 0.1 10*3/uL (ref 0.0–0.5)
Eosinophils Relative: 2 %
HCT: 37.2 % — ABNORMAL LOW (ref 39.0–52.0)
Hemoglobin: 12.7 g/dL — ABNORMAL LOW (ref 13.0–17.0)
Immature Granulocytes: 0 %
Lymphocytes Relative: 31 %
Lymphs Abs: 2 10*3/uL (ref 0.7–4.0)
MCH: 31.1 pg (ref 26.0–34.0)
MCHC: 34.1 g/dL (ref 30.0–36.0)
MCV: 91 fL (ref 80.0–100.0)
Monocytes Absolute: 0.5 10*3/uL (ref 0.1–1.0)
Monocytes Relative: 7 %
Neutro Abs: 3.8 10*3/uL (ref 1.7–7.7)
Neutrophils Relative %: 59 %
Platelets: 177 10*3/uL (ref 150–400)
RBC: 4.09 MIL/uL — ABNORMAL LOW (ref 4.22–5.81)
RDW: 11.7 % (ref 11.5–15.5)
WBC: 6.5 10*3/uL (ref 4.0–10.5)
nRBC: 0 % (ref 0.0–0.2)

## 2020-06-28 LAB — COOXEMETRY PANEL
Carboxyhemoglobin: 14.9 % (ref 0.5–1.5)
Carboxyhemoglobin: 3.9 % — ABNORMAL HIGH (ref 0.5–1.5)
Methemoglobin: 1.3 % (ref 0.0–1.5)
Methemoglobin: 0.9 % (ref 0.0–1.5)
O2 Saturation: 99.7 %
O2 Saturation: 99.4 %
Total hemoglobin: 12.6 g/dL (ref 12.0–16.0)
Total hemoglobin: 12.5 g/dL (ref 12.0–16.0)

## 2020-06-28 NOTE — Discharge Instructions (Addendum)
Please follow-up with your primary care provider regarding today's encounter.  Your symptoms are likely related to your carbon monoxide exposure.  I am glad that your symptoms have improved while here in the ED with high flow oxygen.  Your repeat laboratory work-up also showed significant improvement in your carboxyhemoglobin, a lab test for carbon monoxide exposure.  Please have low threshold to return to seek immediate medical attention or return to the ED should you develop any new or worsening symptoms.

## 2020-06-28 NOTE — ED Provider Notes (Signed)
[facility] EMERGENCY DEPARTMENT Provider Note   CSN: [id number] Arrival date & time: 06/28/20  1301     History No chief complaint on file.   [name] [name] is a 75 y.o. male with a past medical history of hypertension, prior stroke with some left-sided weakness, CAD presenting to the ED for dizziness, lightheadedness, nausea and shortness of breath.  States that he went to his garage to check on the generator this morning.  He was in the garage for about 35 to 45 minutes before he started feeling lightheaded, dizzy.  He realized there was no adequate ventilation in his garage and states that he was able to ambulate to inside his home and tell his wife to call 911.  States that he did stumble and fall to the floor, bruised his L knee but denies any loss of consciousness.  He complains of a headache currently.  Denies any chest pain, cough, vomiting, fever, neck stiffness.  He was asymptomatic and in his usual state of health prior to this incident in the garage.  HPI     Past Medical History:  Diagnosis Date  . Anxiety   . Carpal tunnel syndrome   . Coronary artery disease   . Depression   . Hepatitis   . Hypercholesteremia   . Hypertension   . Myocardial infarction (HCC) 2010ish  . Pneumonia   . PTSD (post-traumatic stress disorder)   . PTSD (post-traumatic stress disorder)   . Sleep apnea     doesnt use CPAP  . Stroke (HCC)    slight weakness left- 85- 90 % returned    Patient Active Problem List   Diagnosis Date Noted  . Status post arthroscopy of left knee 08/12/2019  . Complex tear of medial meniscus of left knee 08/04/2019  . Chest pain 05/27/2014  . Left-sided chest wall pain 01/28/2014  . Upper airway cough syndrome 01/28/2014  . Arteriosclerosis of coronary artery 09/22/2013  . HLD (hyperlipidemia) 09/22/2013  . BP (high blood pressure) 09/22/2013  . Upper respiratory infection 07/25/2013  . Dehydration 07/25/2013  . Carpal tunnel syndrome,  right 06/10/2013  . H/O malignant neoplasm of skin 03/19/2013  . Atypical chest pain 04/13/2012  . Slurred speech 04/13/2012  . Blurred vision, bilateral 04/13/2012  . 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 TIA (transient ischemic attack) 11/22/2011  . Volume depletion 11/22/2011  . Aphasia 11/21/2011  . Musculoskeletal chest pain 11/21/2011  . Normocytic anemia 11/21/2011  . Thrombocytopenia (HCC) 11/21/2011  . Metabolic encephalopathy 11/21/2011  . 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 CAD (coronary artery disease) 06/05/2011    Past Surgical History:  Procedure Laterality Date  . CARDIAC CATHETERIZATION    . CARPAL TUNNEL RELEASE Right 06/10/2013   Procedure: RIGHT CARPAL TUNNEL RELEASE;  Surgeon: Kathryne Hitch, MD;  Location: [facility] OR;  Service: Orthopedics;  Laterality: Right;  . CORONARY ANGIOPLASTY    . EYE SURGERY Bilateral    cataract  . KNEE ARTHROSCOPY Right   . KNEE ARTHROSCOPY Left 08/04/2019   Procedure: LEFT KNEE ARTHROSCOPY WITH PARTIAL MEDIAL MENISCECTOMY;  Surgeon: Kathryne Hitch, MD;  Location: [facility];  Service: Orthopedics;  Laterality: Left;  . NASAL SINUS SURGERY    . stents  2012       Family History  Problem Relation Age of Onset  . Heart disease Mother        Several other maternal family members  . Lung cancer Father  small cell CA/smoked    Social History   Tobacco Use  . Smoking status: Former Smoker    Packs/day: 2.00    Years: 20.00    Pack years: 40.00    Types: Cigarettes    Quit date: 11/21/1987    Years since quitting: 32.6  . Smokeless tobacco: Never Used  Substance Use Topics  . Alcohol use: No  . Drug use: No    Home Medications Prior to Admission medications   Medication Sig Start Date End Date Taking? Authorizing Provider  albuterol (PROVENTIL HFA;VENTOLIN HFA) 108 (90 Base) MCG/ACT inhaler Inhale 1-2 puffs into the lungs every 6 (six)  hours as needed for wheezing or shortness of breath. 06/10/18   Derwood Kaplan, MD  aspirin EC 81 MG tablet Take 81 mg by mouth daily.    [provider]  atorvastatin (LIPITOR) 80 MG tablet Take 40 mg by mouth daily.    [provider]  clonazePAM (KLONOPIN) 1 MG tablet Take 1 mg by mouth at bedtime.     [provider]  clopidogrel (PLAVIX) 75 MG tablet Take 75 mg by mouth daily.      [provider]  gabapentin (NEURONTIN) 300 MG capsule Take 300 mg by mouth at bedtime.    [provider]  HYDROcodone-acetaminophen (NORCO/VICODIN) 5-325 MG tablet Take 1 tablet by mouth every 4 (four) hours as needed for moderate pain. 08/04/19 08/03/20  Kathryne Hitch, MD  lisinopril (PRINIVIL,ZESTRIL) 10 MG tablet Take 5 mg by mouth daily.    [provider]  Multiple Vitamin (MULTIVITAMIN) tablet Take 1 tablet by mouth daily.    [provider]  ondansetron (ZOFRAN ODT) 4 MG disintegrating tablet Take 1 tablet (4 mg total) by mouth every 8 (eight) hours as needed for nausea or vomiting. 08/04/19   Kathryne Hitch, MD  predniSONE (DELTASONE) 50 MG tablet Take 1 tablet (50 mg total) by mouth daily with breakfast. 06/12/19   Cathie Hoops, Amy V, PA-C  sertraline (ZOLOFT) 100 MG tablet Take 200 mg by mouth daily.    [provider]    Allergies    Dilaudid [hydromorphone hcl] and Clindamycin/lincomycin  Review of Systems   Review of Systems  Constitutional: Negative for appetite change, chills and fever.  HENT: Negative for ear pain, rhinorrhea, sneezing and sore throat.   Eyes: Negative for photophobia and visual disturbance.  Respiratory: Positive for shortness of breath. Negative for cough, chest tightness and wheezing.   Cardiovascular: Negative for chest pain and palpitations.  Gastrointestinal: Negative for abdominal pain, blood in stool, constipation, diarrhea, nausea and vomiting.  Genitourinary: Negative for dysuria,  hematuria and urgency.  Musculoskeletal: Negative for myalgias.  Skin: Negative for rash.  Neurological: Positive for dizziness and light-headedness. Negative for weakness.    Physical Exam Updated Vital Signs BP 140/80 (BP Location: Left Arm)   Pulse 60   Temp 98.6 F (37 C) (Oral)   Resp 16   SpO2 100%   Physical Exam Vitals and nursing note reviewed.  Constitutional:      General: He is not in acute distress.    Appearance: He is well-developed and well-nourished.     Comments: On a nonrebreather.  Speaking complete sentences without difficulty.  HENT:     Head: Normocephalic and atraumatic.     Nose: Nose normal.  Eyes:     General: No scleral icterus.       Right eye: No discharge.        Left  eye: No discharge.     Extraocular Movements: EOM normal.     Conjunctiva/sclera: Conjunctivae normal.  Cardiovascular:     Rate and Rhythm: Normal rate and regular rhythm.     Pulses: Intact distal pulses.     Heart sounds: Normal heart sounds. No murmur heard. No friction rub. No gallop.   Pulmonary:     Effort: Pulmonary effort is normal. No respiratory distress.     Breath sounds: Normal breath sounds.  Abdominal:     General: Bowel sounds are normal. There is no distension.     Palpations: Abdomen is soft.     Tenderness: There is no abdominal tenderness. There is no guarding.  Musculoskeletal:        General: No edema. Normal range of motion.     Cervical back: Normal range of motion and neck supple.     Comments: Normal range of motion of left knee.  Skin:    General: Skin is warm and dry.     Findings: No rash.  Neurological:     Mental Status: He is alert and oriented to person, place, and time.     Motor: No abnormal muscle tone.     Coordination: Coordination normal.  Psychiatric:        Mood and Affect: Mood and affect normal.     ED Results / Procedures / Treatments   Labs (all labs ordered are listed, but only abnormal results are displayed) Labs  Reviewed  COOXEMETRY PANEL - Abnormal; Notable for the following components:      Result Value   Carboxyhemoglobin 14.9 (*)    All other components within normal limits  COMPREHENSIVE METABOLIC PANEL  CBC WITH DIFFERENTIAL/PLATELET    EKG None  Radiology No results found.  Procedures Procedures (including critical care time)  Medications Ordered in ED Medications - No data to display  ED Course  I have reviewed the triage vital signs and the nursing notes.  Pertinent labs & imaging results that were available during my care of the patient were reviewed by me and considered in my medical decision making (see chart for details).  Clinical Course as of 06/28/20 1442  Mon Jun 28, 2020  1438 Carboxyhemoglobin(!!): 14.9 [HK]  1438 Patient reports significant improvement in symptoms with supplemental oxygen. [HK]    Clinical Course User Index [HK] Delia Heady, PA-C   MDM Rules/Calculators/A&P                          75 year old male presenting to the ED for possible carbon monoxide poisoning.  He was in his garage with a generator for a time.  About 35 to 45 minutes without adequate ventilation.  He reports dizziness, lightheadedness, nausea and shortness of breath after which she went inside his home and his wife called 911.  Upon arrival here he was placed on a nonrebreather and during my evaluation he states that his symptoms have significantly improved.  He still endorses a headache but is in no acute respiratory distress.  Carboxyhemoglobin level elevated at 14.9.  He remains hemodynamically stable.  He will need EKG as well as baseline lab work CMP and CBC.  Will need to contact poison control after these results for their recommendations. I anticipate that he may be able to be discharged home if if symptoms continue to improve and poison control agrees.   Portions of this note were generated with Lobbyist. Dictation errors may occur despite  best attempts at  proofreading.  Final Clinical Impression(s) / ED Diagnoses Final diagnoses:  Carbon monoxide exposure    Rx / DC Orders ED Discharge Orders    None       Dietrich Pates, PA-C 06/28/20 1505    Jacalyn Lefevre, MD 06/28/20 1523    Jacalyn Lefevre, MD 06/28/20 1524

## 2020-06-28 NOTE — ED Notes (Signed)
[facility] (Transfer Line) called @ [address] [place], [place] called by Marylene Land
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
## 2020-06-28 NOTE — ED Triage Notes (Signed)
Pt working on generator in his closed garage for unknown amount of time. Pt stumbled going up the stairs, feeling dizzy and wife called 911. EMS confirmed high CO level. Pt ambulatory. Arrives on NRB, symptoms improving.

## 2020-07-29 ENCOUNTER — Encounter (HOSPITAL_COMMUNITY): Payer: Self-pay | Admitting: Emergency Medicine

## 2020-07-29 ENCOUNTER — Other Ambulatory Visit: Payer: Self-pay

## 2020-07-29 ENCOUNTER — Emergency Department (HOSPITAL_COMMUNITY)
Admission: EM | Admit: 2020-07-29 | Discharge: 2020-07-29 | Disposition: A | Discharge status: Home / Self Care | Payer: No Typology Code available for payment source | Attending: Emergency Medicine | Admitting: Emergency Medicine

## 2020-07-29 ENCOUNTER — Emergency Department (HOSPITAL_COMMUNITY): Payer: No Typology Code available for payment source

## 2020-07-29 DIAGNOSIS — I10 Essential (primary) hypertension: Secondary | ICD-10-CM | POA: Diagnosis not present

## 2020-07-29 DIAGNOSIS — W11XXXA Fall on and from ladder, initial encounter: Secondary | ICD-10-CM | POA: Insufficient documentation

## 2020-07-29 DIAGNOSIS — Z7982 Long term (current) use of aspirin: Secondary | ICD-10-CM | POA: Diagnosis not present

## 2020-07-29 DIAGNOSIS — Z8673 Personal history of transient ischemic attack (TIA), and cerebral infarction without residual deficits: Secondary | ICD-10-CM | POA: Diagnosis not present

## 2020-07-29 DIAGNOSIS — Z87891 Personal history of nicotine dependence: Secondary | ICD-10-CM | POA: Diagnosis not present

## 2020-07-29 DIAGNOSIS — R0781 Pleurodynia: Secondary | ICD-10-CM | POA: Diagnosis present

## 2020-07-29 DIAGNOSIS — R52 Pain, unspecified: Secondary | ICD-10-CM

## 2020-07-29 DIAGNOSIS — M542 Cervicalgia: Secondary | ICD-10-CM | POA: Diagnosis not present

## 2020-07-29 DIAGNOSIS — Z79899 Other long term (current) drug therapy: Secondary | ICD-10-CM | POA: Diagnosis not present

## 2020-07-29 DIAGNOSIS — M25571 Pain in right ankle and joints of right foot: Secondary | ICD-10-CM | POA: Diagnosis not present

## 2020-07-29 DIAGNOSIS — Z7902 Long term (current) use of antithrombotics/antiplatelets: Secondary | ICD-10-CM | POA: Insufficient documentation

## 2020-07-29 DIAGNOSIS — R0789 Other chest pain: Secondary | ICD-10-CM | POA: Insufficient documentation

## 2020-07-29 DIAGNOSIS — R079 Chest pain, unspecified: Secondary | ICD-10-CM

## 2020-07-29 DIAGNOSIS — I2510 Atherosclerotic heart disease of native coronary artery without angina pectoris: Secondary | ICD-10-CM | POA: Diagnosis not present

## 2020-07-29 DIAGNOSIS — S20211A Contusion of right front wall of thorax, initial encounter: Secondary | ICD-10-CM

## 2020-07-29 LAB — CBC
HCT: 33.7 % — ABNORMAL LOW (ref 39.0–52.0)
Hemoglobin: 11.7 g/dL — ABNORMAL LOW (ref 13.0–17.0)
MCH: 30.6 pg (ref 26.0–34.0)
MCHC: 34.7 g/dL (ref 30.0–36.0)
MCV: 88.2 fL (ref 80.0–100.0)
Platelets: 160 10*3/uL (ref 150–400)
RBC: 3.82 MIL/uL — ABNORMAL LOW (ref 4.22–5.81)
RDW: 12 % (ref 11.5–15.5)
WBC: 6.3 10*3/uL (ref 4.0–10.5)
nRBC: 0 % (ref 0.0–0.2)

## 2020-07-29 LAB — COMPREHENSIVE METABOLIC PANEL
ALT: 16 U/L (ref 0–44)
AST: 19 U/L (ref 15–41)
Albumin: 3.7 g/dL (ref 3.5–5.0)
Alkaline Phosphatase: 89 U/L (ref 38–126)
Anion gap: 12 (ref 5–15)
BUN: 17 mg/dL (ref 8–23)
CO2: 24 mmol/L (ref 22–32)
Calcium: 9.3 mg/dL (ref 8.9–10.3)
Chloride: 104 mmol/L (ref 98–111)
Creatinine, Ser: 0.84 mg/dL (ref 0.61–1.24)
GFR, Estimated: >60 mL/min (ref >60)
Glucose, Bld: 95 mg/dL (ref 70–99)
Potassium: 3.6 mmol/L (ref 3.5–5.1)
Sodium: 140 mmol/L (ref 135–145)
Total Bilirubin: 0.9 mg/dL (ref 0.3–1.2)
Total Protein: 6.3 g/dL — ABNORMAL LOW (ref 6.5–8.1)

## 2020-07-29 LAB — I-STAT CHEM 8, ED
BUN: 19 mg/dL (ref 8–23)
Calcium, Ion: 1.16 mmol/L (ref 1.15–1.40)
Chloride: 107 mmol/L (ref 98–111)
Creatinine, Ser: 0.7 mg/dL (ref 0.61–1.24)
Glucose, Bld: 93 mg/dL (ref 70–99)
HCT: 32 % — ABNORMAL LOW (ref 39.0–52.0)
Hemoglobin: 10.9 g/dL — ABNORMAL LOW (ref 13.0–17.0)
Potassium: 3.7 mmol/L (ref 3.5–5.1)
Sodium: 143 mmol/L (ref 135–145)
TCO2: 26 mmol/L (ref 22–32)

## 2020-07-29 LAB — ETHANOL: Alcohol, Ethyl (B): <10 mg/dL (ref <10)

## 2020-07-29 LAB — SAMPLE TO BLOOD BANK

## 2020-07-29 LAB — LACTIC ACID, PLASMA: Lactic Acid, Venous: 0.8 mmol/L (ref 0.5–1.9)

## 2020-07-29 LAB — PROTIME-INR
INR: 1 (ref 0.8–1.2)
Prothrombin Time: 12.9 seconds (ref 11.4–15.2)

## 2020-07-29 MED ORDER — IOHEXOL 300 MG/ML  SOLN
100.0000 mL | Freq: Once | INTRAMUSCULAR | Status: AC | PRN
  Administered 2020-07-29: 100 mL via INTRAVENOUS

## 2020-07-29 MED ORDER — LACTATED RINGERS IV SOLN: INTRAVENOUS | Status: DC

## 2020-07-29 MED ORDER — FENTANYL CITRATE (PF) 100 MCG/2ML IJ SOLN: 50.0000 ug | INTRAMUSCULAR | Status: DC | PRN

## 2020-07-29 MED ORDER — HYDROCODONE-ACETAMINOPHEN 5-325 MG PO TABS: 1.0000 | ORAL_TABLET | ORAL | 0 refills | Status: DC | PRN

## 2020-07-29 MED ORDER — FENTANYL CITRATE (PF) 100 MCG/2ML IJ SOLN
50.0000 ug | Freq: Once | INTRAMUSCULAR | Status: AC
  Administered 2020-07-29: 50 ug via INTRAVENOUS
  Filled 2020-07-29: qty 2

## 2020-07-29 NOTE — ED Notes (Signed)
Patient transported to CT 

## 2020-07-29 NOTE — ED Triage Notes (Signed)
Pt arrives to ED via [facility] with c/o of fall from ladder. Pt was between 27ft-5ft up when he fell. Pt landed on right side with pain to right ribs, right shoulder, and right foot. Pt states no LOC but did hit his head. C-collar in place on arrival.

## 2020-07-29 NOTE — ED Notes (Signed)
Wasted Fentanyl with Gaye Alken, RN

## 2020-07-29 NOTE — ED Provider Notes (Signed)
[facility] EMERGENCY DEPARTMENT Provider Note   CSN: [id number] Arrival date & time: 07/29/20  1426     History Chief Complaint  Patient presents with  . Fall    [name] [name] is a 75 y.o. male.  HPI  A 4 foot ladder in his house.  He reports he lost his balance and fell.  He fell onto the right side of his body.  He reports his shoulder and arm mostly broke the fall.  Reports he has a lot of pain in the right shoulder and in the right side of his chest.  It does hurt to take a deep breath.  He reports he did not hit his head.  He did not have loss of consciousness.  He feels a little bit of stiffness in the neck but pain is not severe.  Reports he also has a lot of pain in the right ankle.  No abdominal pain no nausea no vomiting.  Patient reports he takes daily Plavix.    Past Medical History:  Diagnosis Date  . Anxiety   . Carpal tunnel syndrome   . Coronary artery disease   . Depression   . Hepatitis   . Hypercholesteremia   . Hypertension   . Myocardial infarction (HCC) 2010ish  . Pneumonia   . PTSD (post-traumatic stress disorder)   . PTSD (post-traumatic stress disorder)   . Sleep apnea     doesnt use CPAP  . Stroke (HCC)    slight weakness left- 85- 90 % returned    Patient Active Problem List   Diagnosis Date Noted  . Status post arthroscopy of left knee 08/12/2019  . Complex tear of medial meniscus of left knee 08/04/2019  . Chest pain 05/27/2014  . Left-sided chest wall pain 01/28/2014  . Upper airway cough syndrome 01/28/2014  . Arteriosclerosis of coronary artery 09/22/2013  . HLD (hyperlipidemia) 09/22/2013  . BP (high blood pressure) 09/22/2013  . Upper respiratory infection 07/25/2013  . Dehydration 07/25/2013  . Carpal tunnel syndrome, right 06/10/2013  . H/O malignant neoplasm of skin 03/19/2013  . Atypical chest pain 04/13/2012  . Slurred speech 04/13/2012  . Blurred vision, bilateral 04/13/2012  . 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  . TIA (transient ischemic attack) 11/22/2011  . Volume depletion 11/22/2011  . Aphasia 11/21/2011  . Musculoskeletal chest pain 11/21/2011  . Normocytic anemia 11/21/2011  . Thrombocytopenia (HCC) 11/21/2011  . Metabolic encephalopathy 11/21/2011  . 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 CAD (coronary artery disease) 06/05/2011    Past Surgical History:  Procedure Laterality Date  . CARDIAC CATHETERIZATION    . CARPAL TUNNEL RELEASE Right 06/10/2013   Procedure: RIGHT CARPAL TUNNEL RELEASE;  Surgeon: Kathryne Hitch, MD;  Location: [facility] OR;  Service: Orthopedics;  Laterality: Right;  . CORONARY ANGIOPLASTY    . EYE SURGERY Bilateral    cataract  . KNEE ARTHROSCOPY Right   . KNEE ARTHROSCOPY Left 08/04/2019   Procedure: LEFT KNEE ARTHROSCOPY WITH PARTIAL MEDIAL MENISCECTOMY;  Surgeon: Kathryne Hitch, MD;  Location: [facility];  Service: Orthopedics;  Laterality: Left;  . NASAL SINUS SURGERY    . stents  2012       Family History  Problem Relation Age of Onset  . Heart disease Mother        Several other maternal family members  . Lung cancer Father        small cell CA/smoked  Social History   Tobacco Use  . Smoking status: Former Smoker    Packs/day: 2.00    Years: 20.00    Pack years: 40.00    Types: Cigarettes    Quit date: 11/21/1987    Years since quitting: 32.7  . Smokeless tobacco: Never Used  Substance Use Topics  . Alcohol use: No  . Drug use: No    Home Medications Prior to Admission medications   Medication Sig Start Date End Date Taking? Authorizing Provider  albuterol (PROVENTIL HFA;VENTOLIN HFA) 108 (90 Base) MCG/ACT inhaler Inhale 1-2 puffs into the lungs every 6 (six) hours as needed for wheezing or shortness of breath. 06/10/18   Derwood Kaplan, MD  aspirin EC 81 MG tablet Take 81 mg by mouth daily.    [provider]  atorvastatin (LIPITOR) 80 MG  tablet Take 40 mg by mouth daily.    [provider]  clonazePAM (KLONOPIN) 1 MG tablet Take 1 mg by mouth at bedtime.     [provider]  clopidogrel (PLAVIX) 75 MG tablet Take 75 mg by mouth daily.      [provider]  gabapentin (NEURONTIN) 300 MG capsule Take 300 mg by mouth at bedtime.    [provider]  HYDROcodone-acetaminophen (NORCO/VICODIN) 5-325 MG tablet Take 1 tablet by mouth every 4 (four) hours as needed for moderate pain. 08/04/19 08/03/20  Kathryne Hitch, MD  lisinopril (PRINIVIL,ZESTRIL) 10 MG tablet Take 5 mg by mouth daily.    [provider]  Multiple Vitamin (MULTIVITAMIN) tablet Take 1 tablet by mouth daily.    [provider]  ondansetron (ZOFRAN ODT) 4 MG disintegrating tablet Take 1 tablet (4 mg total) by mouth every 8 (eight) hours as needed for nausea or vomiting. 08/04/19   Kathryne Hitch, MD  predniSONE (DELTASONE) 50 MG tablet Take 1 tablet (50 mg total) by mouth daily with breakfast. 06/12/19   Cathie Hoops, Amy V, PA-C  sertraline (ZOLOFT) 100 MG tablet Take 200 mg by mouth daily.    [provider]    Allergies    Dilaudid [hydromorphone hcl] and Clindamycin/lincomycin  Review of Systems   Review of Systems 10 systems reviewed and negative except as per HPI Physical Exam Updated Vital Signs BP (!) 151/83 (BP Location: Left Arm)   Pulse 61   Temp 98.3 F (36.8 C) (Oral)   Resp 12   Ht 5\' 10"  (1.778 m)   Wt 97.1 kg   SpO2 98%   BMI 30.72 kg/m   Physical Exam Constitutional:      Comments: Patient is alert and nontoxic.  He does not have respiratory distress.  He does appear uncomfortable and is moaning and holding his right shoulder.  HENT:     Head: Normocephalic and atraumatic.     Nose: Nose normal.     Mouth/Throat:     Pharynx: Oropharynx is clear.  Eyes:     Extraocular Movements: Extraocular movements intact.     Pupils: Pupils are equal, round, and reactive to light.   Neck:     Comments: C-collar maintained. Cardiovascular:     Rate and Rhythm: Normal rate and regular rhythm.  Pulmonary:     Effort: Pulmonary effort is normal.     Breath sounds: Normal breath sounds.     Comments: Symmetric breath sounds.  Chest wall is tender over right lateral chest wall.  No contusions or crepitus peer Abdominal:     Comments: Patient endorses tenderness in the  right upper and mid lateral quadrants.  No contusions or abrasions present.  No significant distention.  Musculoskeletal:     Comments: Patient dorsa significant pain with any range of motion of right shoulder.  Not grossly displaced.  No effusions or deformity of the elbows wrists or hands.  She endorses pain of the right lateral ankle.  No significant effusion or deformity at this time.  Skin:    General: Skin is warm and dry.  Neurological:     General: No focal deficit present.     Mental Status: He is oriented to person, place, and time.     Coordination: Coordination normal.     ED Results / Procedures / Treatments   Labs (all labs ordered are listed, but only abnormal results are displayed) Labs Reviewed  I-STAT CHEM 8, ED - Abnormal; Notable for the following components:      Result Value   Hemoglobin 10.9 (*)    HCT 32.0 (*)    All other components within normal limits  SARS CORONAVIRUS 2 BY RT PCR (HOSPITAL ORDER, PERFORMED IN [facility])  COMPREHENSIVE METABOLIC PANEL  CBC  ETHANOL  URINALYSIS, ROUTINE W REFLEX MICROSCOPIC  LACTIC ACID, PLASMA  PROTIME-INR  SAMPLE TO BLOOD BANK    EKG EKG Interpretation  Date/Time:  Thursday July 29 2020 14:47:02 EST Ventricular Rate:  55 PR Interval:    QRS Duration: 126 QT Interval:  448 QTC Calculation: 429 R Axis:   64 Text Interpretation: Sinus rhythm IVCD, consider atypical RBBB no sig change from previous Confirmed by Arby Barrette [phone]) on 07/29/2020 3:15:54 PM   Radiology No results  found.  Procedures Procedures   Medications Ordered in ED Medications  fentaNYL (SUBLIMAZE) injection 50 mcg (has no administration in time range)  lactated ringers infusion (has no administration in time range)  fentaNYL (SUBLIMAZE) injection 50 mcg (has no administration in time range)    ED Course  I have reviewed the triage vital signs and the nursing notes.  Pertinent labs & imaging results that were available during my care of the patient were reviewed by me and considered in my medical decision making (see chart for details).    MDM Rules/Calculators/A&P                          Patient fell from a ladder approximately 4 feet.  He has multiple areas of pain.  Mental status is clear with GCS of 15.  No focal neurologic deficits.  He does have significant right chest wall pain.  Breath sounds are symmetric.  Clinically stable respiratory status at this time.  Patient will need CT scan head, neck, chest, abdomen.  Pain films ordered for right shoulder and ankle pain.  Fentanyl as needed ordered for pain control.  Dr. Particia Nearing to review diagnostic results for final disposition. Final Clinical Impression(s) / ED Diagnoses Final diagnoses:  Chest pain  Fall from ladder, initial encounter  Other chest pain    Rx / DC Orders ED Discharge Orders    None       Arby Barrette, MD 07/29/20 1535

## 2020-07-29 NOTE — ED Provider Notes (Signed)
Pt signed out by Dr. Donnald Garre pending CT scans.  CT scans and Xrays show no fracture.  Pt is able to ambulate.  His biggest pain is in his right ribs, so he's given an incentive spirometer.    He is stable for d/c.  Return if worse.   Jacalyn Lefevre, MD 07/29/20 [phone]

## 2020-07-29 NOTE — ED Notes (Signed)
Patient verbalizes understanding of discharge instructions. Opportunity for questioning and answers were provided. Armband removed by staff, pt discharged from ED.  

## 2021-04-01 ENCOUNTER — Ambulatory Visit (INDEPENDENT_AMBULATORY_CARE_PROVIDER_SITE_OTHER): Payer: No Typology Code available for payment source | Admitting: Orthopaedic Surgery

## 2021-04-01 ENCOUNTER — Encounter: Payer: Self-pay | Admitting: Orthopaedic Surgery

## 2021-04-01 ENCOUNTER — Other Ambulatory Visit: Payer: Self-pay

## 2021-04-01 ENCOUNTER — Ambulatory Visit: Payer: Self-pay

## 2021-04-01 VITALS — BP 149/83 | HR 70 | Ht 70.0 in | Wt 220.0 lb

## 2021-04-01 DIAGNOSIS — M6528 Calcific tendinitis, other site: Secondary | ICD-10-CM | POA: Diagnosis not present

## 2021-04-01 DIAGNOSIS — M25571 Pain in right ankle and joints of right foot: Secondary | ICD-10-CM

## 2021-04-01 NOTE — Progress Notes (Signed)
Office Visit Note   Patient: [name] [name]           Date of Birth: 10/17/1945           MRN: [id number] Visit Date: 04/01/2021              Requested by: Louanne Skye, MD [address] [place],  [place] [address] PCP: Louanne Skye, MD   Assessment & Plan: Visit Diagnoses:  1. Pain in right ankle and joints of right foot   2. Calcific Achilles tendonitis     Plan: Try to call his wife to find out exactly how long the cast was on and left her message.  We will place him in a short leg fiberglass cast recheck 6 weeks for cast off and he will bring in his cam boot when he returns.  We discussed that if immobilization does not help his partial tearing and surgery be required and we discussed the 3 to 6 months ordeal postoperatively that occurs with surgery on this problem.  He states he has had problems already with it for over 40months.  Recheck 6 weeks.  Follow-Up Instructions: Return in about 6 weeks (around 05/13/2021).   Orders:  Orders Placed This Encounter  Procedures   XR Ankle Complete Right   No orders of the defined types were placed in this encounter.     Procedures: No procedures performed   Clinical Data: No additional findings.   Subjective: Chief Complaint  Patient presents with   Right Leg - Pain    HPI 75 year old male returns he had previous knee arthroscopy by me several years ago.  He has had some problems with the shoulder as well followed at the [place] with some rotator cuff tearing and he states surgery has been discussed at the [facility] on his shoulder.  His principal problem has been his right Achilles with prominent pump bump.  He states he went out to the barn during cold spell in January with snow started to generator but did not ventilate it well with the ventilation door closed and then got lightheaded headed out of the barn and slipped and fell on a concrete slab hitting the back of his ankle with increased pain.  Patient has a pump bump with bursal  swelling over Achilles tendon insertion.  He has been treated by podiatrist he states he has been in 2 different boots 1 and then plantarflexed position he states he is in a cast but memory is not good and he is not sure how long he wore the cast he thinks just a few weeks.  MRI report is able to be reviewed which shows tendinosis partial tearing at the insertion site.  Opposite foot is not giving him any problems.  Patient had old history of a CVA with some left hemiplegia which resolved carpal tunnel on the right.  He has some coronary artery disease.  Wife is our patient but she is not present on today's visit.  Review of Systems all the systems noncontributory to HPI.   Objective: Vital Signs: BP (!) 149/83   Pulse 70   Ht 5\' 10"  (1.778 m)   Wt 220 lb (99.8 kg)   BMI 31.57 kg/m   Physical Exam Constitutional:      Appearance: He is well-developed.  HENT:     Head: Normocephalic and atraumatic.     Right Ear: External ear normal.     Left Ear: External ear normal.  Eyes:  Pupils: Pupils are equal, round, and reactive to light.  Neck:     Thyroid: No thyromegaly.     Trachea: No tracheal deviation.  Cardiovascular:     Rate and Rhythm: Normal rate.  Pulmonary:     Effort: Pulmonary effort is normal.     Breath sounds: No wheezing.  Abdominal:     General: Bowel sounds are normal.     Palpations: Abdomen is soft.  Musculoskeletal:     Cervical back: Neck supple.  Skin:    General: Skin is warm and dry.     Capillary Refill: Capillary refill takes less than 2 seconds.  Neurological:     Mental Status: He is alert and oriented to person, place, and time.  Psychiatric:        Behavior: Behavior normal.        Thought Content: Thought content normal.        Judgment: Judgment normal.    Ortho Exam patient ambulates with a tennis shoe with heel pushed down.  He keeps his ankle fixed with ambulation short stride and foot and external rotation with no motion of the  ankle.  He has extreme pain with palpation over the pump pump insertion site and significant pain with gastrocsoleus contraction.  Specialty Comments:  No specialty comments available.  Imaging: XR Ankle Complete Right  Result Date: 04/01/2021 Three-view x-rays right ankle obtained and reviewed.  This shows prominent soft tissue swelling and partial calcification Achilles tendon insertion consistent with tendinopathy correlates with recent MRI scan.  Bone architecture of the ankle is normal. Impression: Right ankle x-rays negative for acute changes.  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    PMFS History: Patient Active Problem List   Diagnosis Date Noted   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   Status post arthroscopy of left knee 08/12/2019   Complex tear of medial meniscus of left knee 08/04/2019   Chest pain 05/27/2014   Left-sided chest wall pain 01/28/2014   Upper airway cough syndrome 01/28/2014   Arteriosclerosis of coronary artery 09/22/2013   HLD (hyperlipidemia) 09/22/2013   BP (high blood pressure) 09/22/2013   Upper respiratory infection 07/25/2013   Dehydration 07/25/2013   Carpal tunnel syndrome, right 06/10/2013   H/O malignant neoplasm of skin 03/19/2013   Atypical chest pain 04/13/2012   Slurred speech 04/13/2012   Blurred vision, bilateral 04/13/2012   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   TIA (transient ischemic attack) 11/22/2011   Volume depletion 11/22/2011   Aphasia 11/21/2011   Musculoskeletal chest pain 11/21/2011   Normocytic anemia 11/21/2011   Thrombocytopenia (HCC) 11/21/2011   Metabolic encephalopathy 11/21/2011   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 06/05/2011   CAD (coronary artery disease) 06/05/2011   Past Medical History:  Diagnosis Date   Anxiety    Carpal tunnel syndrome    Coronary artery disease     Depression    Hepatitis    Hypercholesteremia    Hypertension    Myocardial infarction (HCC) 2010ish   Pneumonia    PTSD (post-traumatic stress disorder)    PTSD (post-traumatic stress disorder)    Sleep apnea     doesnt use CPAP   Stroke (HCC)    slight weakness left- 85- 90 % returned    Family History  Problem Relation Age of Onset   Heart disease Mother        Several other maternal family members   Lung cancer Father  small cell CA/smoked    Past Surgical History:  Procedure Laterality Date   CARDIAC CATHETERIZATION     CARPAL TUNNEL RELEASE Right 06/10/2013   Procedure: RIGHT CARPAL TUNNEL RELEASE;  Surgeon: Kathryne Hitch, MD;  Location: [facility] OR;  Service: Orthopedics;  Laterality: Right;   CORONARY ANGIOPLASTY     EYE SURGERY Bilateral    cataract   KNEE ARTHROSCOPY Right    KNEE ARTHROSCOPY Left 08/04/2019   Procedure: LEFT KNEE ARTHROSCOPY WITH PARTIAL MEDIAL MENISCECTOMY;  Surgeon: Kathryne Hitch, MD;  Location: [facility];  Service: Orthopedics;  Laterality: Left;   NASAL SINUS SURGERY     stents  2012   Social History   Occupational History   Occupation: Retired Hotel manager   Tobacco Use   Smoking status: Former    Packs/day: 2.00    Years: 20.00    Pack years: 40.00    Types: Cigarettes    Quit date: 11/21/1987    Years since quitting: 33.3   Smokeless tobacco: Never  Substance and Sexual Activity   Alcohol use: No   Drug use: No   Sexual activity: Not on file

## 2021-04-06 ENCOUNTER — Telehealth: Payer: Self-pay | Admitting: Orthopaedic Surgery

## 2021-04-06 NOTE — Telephone Encounter (Signed)
04/01/21 ov note faxed to [facility] [phone]

## 2021-04-12 ENCOUNTER — Ambulatory Visit (INDEPENDENT_AMBULATORY_CARE_PROVIDER_SITE_OTHER): Payer: No Typology Code available for payment source | Admitting: Orthopaedic Surgery

## 2021-04-12 ENCOUNTER — Encounter: Payer: Self-pay | Admitting: Orthopaedic Surgery

## 2021-04-12 ENCOUNTER — Other Ambulatory Visit: Payer: Self-pay

## 2021-04-12 VITALS — BP 122/79 | HR 64 | Ht 70.0 in | Wt 220.0 lb

## 2021-04-12 DIAGNOSIS — M7660 Achilles tendinitis, unspecified leg: Secondary | ICD-10-CM

## 2021-04-12 DIAGNOSIS — M6528 Calcific tendinitis, other site: Secondary | ICD-10-CM | POA: Diagnosis not present

## 2021-04-12 NOTE — Progress Notes (Signed)
Office Visit Note   Patient: [name] [name]           Date of Birth: 05/20/1946           MRN: [id number] Visit Date: 04/12/2021              Requested by: Louanne Skye, MD [address] [place],  [place] [address] PCP: Louanne Skye, MD   Assessment & Plan: Visit Diagnoses:  1. Calcific Achilles tendonitis     Plan: We reinforced this cast.  Discussed the arthritic changes in his right shoulder with some marginal osteophytes noted on previous chest x-ray last year.  We do not have recent shoulder radiographs to review and he states these were done at the [place].  He will follow-up as scheduled  Follow-Up Instructions: Return in about 1 week (around 04/19/2021).   Orders:  No orders of the defined types were placed in this encounter.  No orders of the defined types were placed in this encounter.     Procedures: No procedures performed   Clinical Data: No additional findings.   Subjective: Chief Complaint  Patient presents with   Right Shoulder - Pain    HPI 75 year old male seen with right shoulder pain.  Patient been treated with a cast on his ankle and the bottom of it is gotten soft.  Originally injured his shoulder many years ago he noticed decreased range of motion.  He had an injection in his shoulder at the [place] in the past and he was told that his shoulder was significantly worn and he would need shoulder arthroplasty for treatment.  Past history of CVA with left hemiplegia.  He has coronary artery disease hypertension.  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  Review of Systems all systems updated unchanged from 04/01/2021.   Objective: Vital Signs: BP 122/79   Pulse 64   Ht 5\' 10"  (1.778 m)   Wt 220 lb (99.8 kg)   BMI 31.57 kg/m   Physical Exam Constitutional:      Appearance: He is well-developed.  HENT:     Head: Normocephalic and atraumatic.     Right Ear: External ear normal.      Left Ear: External ear normal.  Eyes:     Pupils: Pupils are equal, round, and reactive to light.  Neck:     Thyroid: No thyromegaly.     Trachea: No tracheal deviation.  Cardiovascular:     Rate and Rhythm: Normal rate.  Pulmonary:     Effort: Pulmonary effort is normal.     Breath sounds: No wheezing.  Abdominal:     General: Bowel sounds are normal.     Palpations: Abdomen is soft.  Musculoskeletal:     Cervical back: Neck supple.  Skin:    General: Skin is warm and dry.     Capillary Refill: Capillary refill takes less than 2 seconds.  Neurological:     Mental Status: He is alert and oriented to person, place, and time.  Psychiatric:        Behavior: Behavior normal.        Thought Content: Thought content normal.        Judgment: Judgment normal.    Ortho Exam patient has 50% range of motion of the left shoulder with Abduction Flexion.  He Can Only Reach the Posterior Axillary Line.  Crepitus with Shoulder Range Of Motion.  Specialty Comments:  No specialty comments available.  Imaging: No results found.   PMFS History: Patient Active Problem List   Diagnosis Date Noted   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   Status post arthroscopy of left knee 08/12/2019   Complex tear of medial meniscus of left knee 08/04/2019   Chest pain 05/27/2014   Left-sided chest wall pain 01/28/2014   Upper airway cough syndrome 01/28/2014   Arteriosclerosis of coronary artery 09/22/2013   HLD (hyperlipidemia) 09/22/2013   BP (high blood pressure) 09/22/2013   Upper respiratory infection 07/25/2013   Dehydration 07/25/2013   Carpal tunnel syndrome, right 06/10/2013   H/O malignant neoplasm of skin 03/19/2013   Atypical chest pain 04/13/2012   Slurred speech 04/13/2012   Blurred vision, bilateral 04/13/2012   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   TIA (transient ischemic attack) 11/22/2011   Volume depletion 11/22/2011   Aphasia 11/21/2011   Musculoskeletal chest  pain 11/21/2011   Normocytic anemia 11/21/2011   Thrombocytopenia (HCC) 11/21/2011   Metabolic encephalopathy 11/21/2011   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 06/05/2011   CAD (coronary artery disease) 06/05/2011   Past Medical History:  Diagnosis Date   Anxiety    Carpal tunnel syndrome    Coronary artery disease    Depression    Hepatitis    Hypercholesteremia    Hypertension    Myocardial infarction (HCC) 2010ish   Pneumonia    PTSD (post-traumatic stress disorder)    PTSD (post-traumatic stress disorder)    Sleep apnea     doesnt use CPAP   Stroke (HCC)    slight weakness left- 85- 90 % returned    Family History  Problem Relation Age of Onset   Heart disease Mother        Several other maternal family members   Lung cancer Father        small cell CA/smoked    Past Surgical History:  Procedure Laterality Date   CARDIAC CATHETERIZATION     CARPAL TUNNEL RELEASE Right 06/10/2013   Procedure: RIGHT CARPAL TUNNEL RELEASE;  Surgeon: Kathryne Hitch, MD;  Location: [facility] OR;  Service: Orthopedics;  Laterality: Right;   CORONARY ANGIOPLASTY     EYE SURGERY Bilateral    cataract   KNEE ARTHROSCOPY Right    KNEE ARTHROSCOPY Left 08/04/2019   Procedure: LEFT KNEE ARTHROSCOPY WITH PARTIAL MEDIAL MENISCECTOMY;  Surgeon: Kathryne Hitch, MD;  Location: [facility];  Service: Orthopedics;  Laterality: Left;   NASAL SINUS SURGERY     stents  2012   Social History   Occupational History   Occupation: Retired Hotel manager   Tobacco Use   Smoking status: Former    Packs/day: 2.00    Years: 20.00    Pack years: 40.00    Types: Cigarettes    Quit date: 11/21/1987    Years since quitting: 33.4   Smokeless tobacco: Never  Substance and Sexual Activity   Alcohol use: No   Drug use: No   Sexual activity: Not on file

## 2021-04-20 ENCOUNTER — Other Ambulatory Visit: Payer: Self-pay

## 2021-04-20 ENCOUNTER — Ambulatory Visit (INDEPENDENT_AMBULATORY_CARE_PROVIDER_SITE_OTHER): Payer: No Typology Code available for payment source | Admitting: Orthopaedic Surgery

## 2021-04-20 ENCOUNTER — Encounter: Payer: Self-pay | Admitting: Orthopaedic Surgery

## 2021-04-20 VITALS — BP 121/62 | HR 63 | Ht 70.0 in | Wt 220.0 lb

## 2021-04-20 DIAGNOSIS — M6528 Calcific tendinitis, other site: Secondary | ICD-10-CM | POA: Diagnosis not present

## 2021-04-21 NOTE — Progress Notes (Signed)
Office Visit Note   Patient: [name] [name]           Date of Birth: [birth year]           MRN: [id number] Visit Date: 04/20/2021              Requested by: Louanne Skye, MD [address] [place],  [place] [address] PCP: Louanne Skye, MD   Assessment & Plan: Visit Diagnoses:  1. Calcific Achilles tendonitis     Plan: Patient has cam boot with heel buildup at home as well as a regular cam boot both the long boots and not the short bradycardia.  Help with the long cam boot back on and use it consistently to help unload the tendon.  We discussed rest should help with symptoms.  Recheck 5 weeks.  Follow-Up Instructions: No follow-ups on file.   Orders:  No orders of the defined types were placed in this encounter.  No orders of the defined types were placed in this encounter.     Procedures: No procedures performed   Clinical Data: No additional findings.   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  He has been having pain and has to position his cast a certain way otherwise he has pain points directly over Achilles tendon insertion site.  He still has problems with the shoulder which showed severe tendinopathy of multiple tendons with tendinosis and thickening but no full-thickness tears.  Involvement of the biceps tendon with previous tear.  Superior labral tear noted as well.  He has bilateral shoulder problems.  He is in the office last week and had his cast repaired since he walked to the bottom of it.  Patient returns but states he likes to "piddle"in the shop and work on fixing things.  Shoulder MRI scans were done at the [place].  Review of Systems all the systems noncontributory to HPI.   Objective: Vital Signs: BP 121/62   Pulse 63   Ht 5\' 10"  (1.778 m)   Wt 220 lb (99.8 kg)   BMI 31.57 kg/m   Physical  Exam Constitutional:      Appearance: He is well-developed.  HENT:     Head: Normocephalic and atraumatic.     Right Ear: External ear normal.     Left Ear: External ear normal.  Eyes:     Pupils: Pupils are equal, round, and reactive to light.  Neck:     Thyroid: No thyromegaly.     Trachea: No tracheal deviation.  Cardiovascular:     Rate and Rhythm: Normal rate.  Pulmonary:     Effort: Pulmonary effort is normal.     Breath sounds: No wheezing.  Abdominal:     General: Bowel sounds are normal.     Palpations: Abdomen is soft.  Musculoskeletal:     Cervical back: Neck supple.  Skin:    General: Skin is warm and dry.     Capillary Refill: Capillary refill takes less than 2 seconds.  Neurological:     Mental Status: He is alert and oriented to person, place, and time.  Psychiatric:        Behavior: Behavior normal.        Thought Content: Thought content normal.        Judgment: Judgment normal.    Ortho Exam cast was removed  there is no skin breakdown no redness but he still has tenderness over the Achilles tendon insertion site.  Specialty Comments:  No specialty comments available.  Imaging: No results found.   PMFS History: Patient Active Problem List   Diagnosis Date Noted   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   Status post arthroscopy of left knee 08/12/2019   Complex tear of medial meniscus of left knee 08/04/2019   Chest pain 05/27/2014   Left-sided chest wall pain 01/28/2014   Upper airway cough syndrome 01/28/2014   Arteriosclerosis of coronary artery 09/22/2013   HLD (hyperlipidemia) 09/22/2013   BP (high blood pressure) 09/22/2013   Upper respiratory infection 07/25/2013   Dehydration 07/25/2013   Carpal tunnel syndrome, right 06/10/2013   H/O malignant neoplasm of skin 03/19/2013   Atypical chest pain 04/13/2012   Slurred speech 04/13/2012   Blurred vision, bilateral 04/13/2012   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   TIA  (transient ischemic attack) 11/22/2011   Volume depletion 11/22/2011   Aphasia 11/21/2011   Musculoskeletal chest pain 11/21/2011   Normocytic anemia 11/21/2011   Thrombocytopenia (HCC) 11/21/2011   Metabolic encephalopathy 11/21/2011   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 06/05/2011   CAD (coronary artery disease) 06/05/2011   Past Medical History:  Diagnosis Date   Anxiety    Carpal tunnel syndrome    Coronary artery disease    Depression    Hepatitis    Hypercholesteremia    Hypertension    Myocardial infarction (HCC) 2010ish   Pneumonia    PTSD (post-traumatic stress disorder)    PTSD (post-traumatic stress disorder)    Sleep apnea     doesnt use CPAP   Stroke (HCC)    slight weakness left- 85- 90 % returned    Family History  Problem Relation Age of Onset   Heart disease Mother        Several other maternal family members   Lung cancer Father        small cell CA/smoked    Past Surgical History:  Procedure Laterality Date   CARDIAC CATHETERIZATION     CARPAL TUNNEL RELEASE Right 06/10/2013   Procedure: RIGHT CARPAL TUNNEL RELEASE;  Surgeon: Kathryne Hitch, MD;  Location: [facility] OR;  Service: Orthopedics;  Laterality: Right;   CORONARY ANGIOPLASTY     EYE SURGERY Bilateral    cataract   KNEE ARTHROSCOPY Right    KNEE ARTHROSCOPY Left 08/04/2019   Procedure: LEFT KNEE ARTHROSCOPY WITH PARTIAL MEDIAL MENISCECTOMY;  Surgeon: Kathryne Hitch, MD;  Location:  SURGERY CENTER;  Service: Orthopedics;  Laterality: Left;   NASAL SINUS SURGERY     stents  2012   Social History   Occupational History   Occupation: Retired Hotel manager   Tobacco Use   Smoking status: Former    Packs/day: 2.00    Years: 20.00    Pack years: 40.00    Types: Cigarettes    Quit date: 11/21/1987    Years since quitting: 33.4   Smokeless tobacco: Never  Substance and Sexual Activity   Alcohol use: No   Drug use: No   Sexual activity: Not on file

## 2021-05-10 ENCOUNTER — Ambulatory Visit (INDEPENDENT_AMBULATORY_CARE_PROVIDER_SITE_OTHER): Payer: No Typology Code available for payment source | Admitting: Orthopaedic Surgery

## 2021-05-10 ENCOUNTER — Other Ambulatory Visit: Payer: Self-pay

## 2021-05-10 ENCOUNTER — Encounter: Payer: Self-pay | Admitting: Orthopaedic Surgery

## 2021-05-10 VITALS — BP 159/83 | Ht 70.0 in | Wt 210.0 lb

## 2021-05-10 DIAGNOSIS — M6528 Calcific tendinitis, other site: Secondary | ICD-10-CM

## 2021-05-10 DIAGNOSIS — M7660 Achilles tendinitis, unspecified leg: Secondary | ICD-10-CM

## 2021-05-10 NOTE — Progress Notes (Signed)
Office Visit Note   Patient: [name] [name]           Date of Birth: 1945-10-24           MRN: [id number] Visit Date: 05/10/2021              Requested by: Louanne Skye, MD [address] [place],  [place] [address] PCP: Louanne Skye, MD   Assessment & Plan: Visit Diagnoses:  1. Calcific Achilles tendonitis     Plan: Patient with chronic right calcific tendinopathy at the insertion site of the calcaneus.  He has tried boot did not tolerate a cast.  He has shoulder problems to get a scooter also a wheelchair at home.  He tried a knee roller fell off of and landed in the road.  He states he is ready to consider surgery and will refer him to Dr. Lajoyce Corners for consideration of surgery.  Follow-Up Instructions: No follow-ups on file.   Orders:  No orders of the defined types were placed in this encounter.  No orders of the defined types were placed in this encounter.     Procedures: No procedures performed   Clinical Data: No additional findings.   Subjective: Chief Complaint  Patient presents with   Right Ankle - Follow-up, Pain    75 year old male returns for right calcific Achilles tendinopathy.  He is got to boots we try to cast the cast was rubbing we had to remove it after he had increased problems with it.  He has been wearing cam boot which is elevated he will.  He has shoulder problems he has been full weightbearing.  He tried a knee roller and states he went out to the mailbox with it as he was trying to turn back around he fell over fortunately did not injure himself.  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  Problems with a bad right shoulder with rotator cuff problems.  MRI scan done at the [facility] showed tendinopathy of Achilles tendon insertion site with partial tearing.  Patient states "it is driving him crazy and is ready for surgery".  Review of Systems all other systems reviewed and updated.   Objective: Vital Signs: BP (!) 159/83   Ht 5'  10" (1.778 m)   Wt 210 lb (95.3 kg)   BMI 30.13 kg/m   Physical Exam Constitutional:      Appearance: He is well-developed.  HENT:     Head: Normocephalic and atraumatic.     Right Ear: External ear normal.     Left Ear: External ear normal.  Eyes:     Pupils: Pupils are equal, round, and reactive to light.  Neck:     Thyroid: No thyromegaly.     Trachea: No tracheal deviation.  Cardiovascular:     Rate and Rhythm: Normal rate.  Pulmonary:     Effort: Pulmonary effort is normal.     Breath sounds: No wheezing.  Abdominal:     General: Bowel sounds are normal.     Palpations: Abdomen is soft.  Musculoskeletal:     Cervical back: Neck supple.  Skin:    General: Skin is warm and dry.     Capillary Refill: Capillary refill takes less than 2 seconds.  Neurological:     Mental Status: He is alert and oriented to person, place, and time.  Psychiatric:        Behavior: Behavior normal.        Thought Content: Thought content normal.  Judgment: Judgment normal.    Ortho Exam patient has exquisite tenderness with the prominent pump bump he states even just lightly touching it is painful.  No cellulitis.  Specialty Comments:  No specialty comments available.  Imaging: No results found.   PMFS History: Patient Active Problem List   Diagnosis Date Noted   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   Status post arthroscopy of left knee 08/12/2019   Complex tear of medial meniscus of left knee 08/04/2019   Chest pain 05/27/2014   Left-sided chest wall pain 01/28/2014   Upper airway cough syndrome 01/28/2014   Arteriosclerosis of coronary artery 09/22/2013   HLD (hyperlipidemia) 09/22/2013   BP (high blood pressure) 09/22/2013   Upper respiratory infection 07/25/2013   Dehydration 07/25/2013   Carpal tunnel syndrome, right 06/10/2013   H/O malignant neoplasm of skin 03/19/2013   Atypical chest pain 04/13/2012   Slurred speech 04/13/2012   Blurred vision, bilateral  04/13/2012   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   TIA (transient ischemic attack) 11/22/2011   Volume depletion 11/22/2011   Aphasia 11/21/2011   Musculoskeletal chest pain 11/21/2011   Normocytic anemia 11/21/2011   Thrombocytopenia (HCC) 11/21/2011   Metabolic encephalopathy 11/21/2011   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 06/05/2011   CAD (coronary artery disease) 06/05/2011   Past Medical History:  Diagnosis Date   Anxiety    Carpal tunnel syndrome    Coronary artery disease    Depression    Hepatitis    Hypercholesteremia    Hypertension    Myocardial infarction (HCC) 2010ish   Pneumonia    PTSD (post-traumatic stress disorder)    PTSD (post-traumatic stress disorder)    Sleep apnea     doesnt use CPAP   Stroke (HCC)    slight weakness left- 85- 90 % returned    Family History  Problem Relation Age of Onset   Heart disease Mother        Several other maternal family members   Lung cancer Father        small cell CA/smoked    Past Surgical History:  Procedure Laterality Date   CARDIAC CATHETERIZATION     CARPAL TUNNEL RELEASE Right 06/10/2013   Procedure: RIGHT CARPAL TUNNEL RELEASE;  Surgeon: Kathryne Hitch, MD;  Location: [facility] OR;  Service: Orthopedics;  Laterality: Right;   CORONARY ANGIOPLASTY     EYE SURGERY Bilateral    cataract   KNEE ARTHROSCOPY Right    KNEE ARTHROSCOPY Left 08/04/2019   Procedure: LEFT KNEE ARTHROSCOPY WITH PARTIAL MEDIAL MENISCECTOMY;  Surgeon: Kathryne Hitch, MD;  Location: [facility];  Service: Orthopedics;  Laterality: Left;   NASAL SINUS SURGERY     stents  2012   Social History   Occupational History   Occupation: Retired Hotel manager   Tobacco Use   Smoking status: Former    Packs/day: 2.00    Years: 20.00    Pack years: 40.00    Types: Cigarettes    Quit date: 11/21/1987    Years since quitting: 33.4   Smokeless tobacco: Never  Substance and Sexual Activity    Alcohol use: No   Drug use: No   Sexual activity: Not on file

## 2021-05-13 ENCOUNTER — Ambulatory Visit: Payer: No Typology Code available for payment source | Admitting: Orthopaedic Surgery

## 2021-05-25 ENCOUNTER — Ambulatory Visit: Payer: No Typology Code available for payment source | Admitting: Orthopaedic Surgery

## 2021-05-26 ENCOUNTER — Ambulatory Visit (INDEPENDENT_AMBULATORY_CARE_PROVIDER_SITE_OTHER): Payer: No Typology Code available for payment source | Admitting: Orthopedic Surgery

## 2021-05-26 ENCOUNTER — Encounter: Payer: Self-pay | Admitting: Orthopedic Surgery

## 2021-05-26 DIAGNOSIS — M6528 Calcific tendinitis, other site: Secondary | ICD-10-CM

## 2021-05-26 DIAGNOSIS — M9261 Juvenile osteochondrosis of tarsus, right ankle: Secondary | ICD-10-CM | POA: Diagnosis not present

## 2021-05-26 DIAGNOSIS — M7660 Achilles tendinitis, unspecified leg: Secondary | ICD-10-CM

## 2021-05-26 NOTE — Progress Notes (Signed)
Office Visit Note   Patient: [name] [name]           Date of Birth: 1946/02/07           MRN: [id number] Visit Date: 05/26/2021              Requested by: Louanne Skye, MD [address] [place],  [place] [address] PCP: Louanne Skye, MD  Chief Complaint  Patient presents with   Right Foot - Follow-up    Right pump bump, partial achilles insertional tear      HPI: Patient is a 75 year old gentleman who was seen in referral from Dr. Ophelia Charter.  Patient states that he had a work-related injury with CO2 canisters that struck his heel and he is undergone 1 year of conservative therapy without relief.  Patient states that he cannot perform activities of daily living secondary to pain over the Haglund's deformity and currently uses a fracture boot for ambulation.  Patient is on Plavix for 3 cardiac stents patient states that he takes Neurontin for symptoms from a stroke that involves the left side.  Assessment & Plan: Visit Diagnoses:  1. Calcific Achilles tendonitis   2. Haglund's deformity of right heel     Plan: Due to failure of conservative treatment patient would like to proceed with surgical intervention.  We will plan for open excision of the Haglund's deformity as well as debridement of the calcified Achilles.  Risks and benefits were discussed including rupture of the Achilles revision of the Achilles insertion and difficulty with skin healing.  Discussed the importance of strict nonweightbearing postoperatively to minimize risks of postoperative wound complications.  Patient states he understands wished to proceed at this time.  Follow-Up Instructions: Return in about 2 weeks (around 06/09/2021).   Ortho Exam  Patient is alert, oriented, no adenopathy, well-dressed, normal affect, normal respiratory effort. Examination patient has a good dorsalis pedis pulse he has good ankle and subtalar motion.  With his knee extended he lacks 20 degrees of dorsiflexion to neutral.  He has  a palpable Haglund's deformity which is tender to palpation he also has some tenderness to palpation along the Achilles tendon there is no palpable defects compression of the calf reproduces plantarflexion of the foot.  Imaging: No results found. No images are attached to the encounter.  Labs: Lab Results  Component Value Date   HGBA1C 5.8 (H) 04/13/2012   HGBA1C 5.6 11/21/2011   HGBA1C 5.8 (H) 06/06/2011   ESRSEDRATE 30 (H) 02/28/2016   ESRSEDRATE 19 01/28/2014   REPTSTATUS 11/23/2011 FINAL 11/21/2011   CULT NO GROWTH 11/21/2011     Lab Results  Component Value Date   ALBUMIN 3.7 07/29/2020   ALBUMIN 3.8 06/28/2020   ALBUMIN 3.8 02/28/2016    Lab Results  Component Value Date   MG 2.0 04/12/2012   No results found for: VD25OH  No results found for: PREALBUMIN CBC EXTENDED Latest Ref Rng & Units 07/29/2020 07/29/2020 06/28/2020  WBC 4.0 - 10.5 K/uL - 6.3 6.5  RBC 4.22 - 5.81 MIL/uL - 3.82(L) 4.09(L)  HGB 13.0 - 17.0 g/dL 10.9(L) 11.7(L) 12.7(L)  HCT 39.0 - 52.0 % 32.0(L) 33.7(L) 37.2(L)  PLT 150 - 400 K/uL - 160 177  NEUTROABS 1.7 - 7.7 K/uL - - 3.8  LYMPHSABS 0.7 - 4.0 K/uL - - 2.0     There is no height or weight on file to calculate BMI.  Orders:  No orders of the defined types were placed in this encounter.  No orders of the defined types were placed in this encounter.    Procedures: No procedures performed  Clinical Data: No additional findings.  ROS:  All other systems negative, except as noted in the HPI. Review of Systems  Objective: Vital Signs: There were no vitals taken for this visit.  Specialty Comments:  No specialty comments available.  PMFS History: Patient Active Problem List   Diagnosis Date Noted   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   Status post arthroscopy of left knee 08/12/2019   Complex tear of medial meniscus of left knee 08/04/2019   Chest pain 05/27/2014   Left-sided chest wall pain 01/28/2014   Upper airway cough  syndrome 01/28/2014   Arteriosclerosis of coronary artery 09/22/2013   HLD (hyperlipidemia) 09/22/2013   BP (high blood pressure) 09/22/2013   Upper respiratory infection 07/25/2013   Dehydration 07/25/2013   Carpal tunnel syndrome, right 06/10/2013   H/O malignant neoplasm of skin 03/19/2013   Atypical chest pain 04/13/2012   Slurred speech 04/13/2012   Blurred vision, bilateral 04/13/2012   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   TIA (transient ischemic attack) 11/22/2011   Volume depletion 11/22/2011   Aphasia 11/21/2011   Musculoskeletal chest pain 11/21/2011   Normocytic anemia 11/21/2011   Thrombocytopenia (HCC) 11/21/2011   Metabolic encephalopathy 11/21/2011   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 06/05/2011   CAD (coronary artery disease) 06/05/2011   Past Medical History:  Diagnosis Date   Anxiety    Carpal tunnel syndrome    Coronary artery disease    Depression    Hepatitis    Hypercholesteremia    Hypertension    Myocardial infarction (HCC) 2010ish   Pneumonia    PTSD (post-traumatic stress disorder)    PTSD (post-traumatic stress disorder)    Sleep apnea     doesnt use CPAP   Stroke (HCC)    slight weakness left- 85- 90 % returned    Family History  Problem Relation Age of Onset   Heart disease Mother        Several other maternal family members   Lung cancer Father        small cell CA/smoked    Past Surgical History:  Procedure Laterality Date   CARDIAC CATHETERIZATION     CARPAL TUNNEL RELEASE Right 06/10/2013   Procedure: RIGHT CARPAL TUNNEL RELEASE;  Surgeon: Kathryne Hitch, MD;  Location: [facility] OR;  Service: Orthopedics;  Laterality: Right;   CORONARY ANGIOPLASTY     EYE SURGERY Bilateral    cataract   KNEE ARTHROSCOPY Right    KNEE ARTHROSCOPY Left 08/04/2019   Procedure: LEFT KNEE ARTHROSCOPY WITH PARTIAL MEDIAL MENISCECTOMY;  Surgeon: Kathryne Hitch, MD;  Location:  SURGERY CENTER;   Service: Orthopedics;  Laterality: Left;   NASAL SINUS SURGERY     stents  2012   Social History   Occupational History   Occupation: Retired Hotel manager   Tobacco Use   Smoking status: Former    Packs/day: 2.00    Years: 20.00    Pack years: 40.00    Types: Cigarettes    Quit date: 11/21/1987    Years since quitting: 33.5   Smokeless tobacco: Never  Substance and Sexual Activity   Alcohol use: No   Drug use: No   Sexual activity: Not on file

## 2021-06-23 ENCOUNTER — Encounter (HOSPITAL_COMMUNITY): Payer: Self-pay | Admitting: Orthopedic Surgery

## 2021-06-23 ENCOUNTER — Other Ambulatory Visit: Payer: Self-pay

## 2021-06-23 NOTE — Anesthesia Preprocedure Evaluation (Addendum)
Anesthesia Evaluation  Patient identified by MRN, date of birth, ID band Patient awake    Reviewed: Allergy & Precautions, NPO status , Patient's Chart, lab work & pertinent test results, reviewed documented beta blocker date and time   History of Anesthesia Complications Negative for: history of anesthetic complications  Airway Mallampati: II  TM Distance: >3 FB Neck ROM: Full    Dental  (+) Dental Advisory Given, Partial Lower, Partial Upper   Pulmonary sleep apnea (no longer needs cpap after surgery) , former smoker,    Pulmonary exam normal        Cardiovascular hypertension, Pt. on home beta blockers and Pt. on medications + CAD and + Past MI  Normal cardiovascular exam   See PAT note   Neuro/Psych PSYCHIATRIC DISORDERS Anxiety Depression  Neuromuscular disease CVA, Residual Symptoms    GI/Hepatic negative GI ROS, (+) Hepatitis -  Endo/Other  negative endocrine ROS  Renal/GU negative Renal ROS     Musculoskeletal negative musculoskeletal ROS (+)   Abdominal   Peds  Hematology negative hematology ROS (+)   Anesthesia Other Findings   Reproductive/Obstetrics                           Anesthesia Physical Anesthesia Plan  ASA: 4  Anesthesia Plan: General   Post-op Pain Management: Tylenol PO (pre-op) and Regional block   Induction: Intravenous  PONV Risk Score and Plan: 2 and Treatment may vary due to age or medical condition, Ondansetron and Dexamethasone  Airway Management Planned: LMA  Additional Equipment: None  Intra-op Plan:   Post-operative Plan: Extubation in OR  Informed Consent: I have reviewed the patients History and Physical, chart, labs and discussed the procedure including the risks, benefits and alternatives for the proposed anesthesia with the patient or authorized representative who has indicated his/her understanding and acceptance.     Dental advisory  given  Plan Discussed with: CRNA and Anesthesiologist  Anesthesia Plan Comments:       Anesthesia Quick Evaluation

## 2021-06-23 NOTE — Progress Notes (Addendum)
Spoke with pt for pre-op call. Pt has has hx of CAD with stents. Sees Dr. Senaida Ores for his cardiac history at the [facility]. Pt states he had a cardiac cath done earlier this month and was told he needed another stent placed. He states the cardiologist said he could wait on the stent until after he has this surgery. This is noted by the cardiologist in Care Everywhere in note on 05/25/21 in an addendum.  Pt denies any recent chest pain or shortness of breath. Pt states he is not diabetic.   Chart sent to anesthesia PA.  Pt's surgery is scheduled as ambulatory so no Covid test is required prior to surgery.

## 2021-06-23 NOTE — Progress Notes (Signed)
Anesthesia Chart Review: Same day workup  Follows with cardiology at the [facility] for history of HTN and hyperlipidemia, OSA on CPAP previously (off since nasal surgeries), TIAs (last 2018), CAD s/p PCI x 3 last in 2017.  Last seen 05/25/2021 by Dr. Christa See and discussed that he had been having some recent chest pain, shortness of breath, fatigue and recently had a borderline nuclear study.  Recommendation was to proceed with cath.  Patient did subsequently undergo cath and addendum was added 06/08/2021 stating, "ADDENDUM STATUS: COMPLETED: Reviewed cath and spoke with patient. Has obstructive mLCx. Fits with apical lateral ischemia on nuc. given stability of his symptoms and mild degree of ischemia in addition to relatively lower risk CAD, can postpone intervention in my opinion while he gets his foot surgery. This is also his strong preference. Would continue aspirin through surgery and I told him this. Will plan to discuss in 4 weeks and then likely plan on PCI of LCx electively for stable angina in 6-8 weeks."  Patient will need day of surgery labs and evaluation.  EKG 07/29/2020: Sinus bradycardia.  Rate 55. IVCD, consider atypical RBBB. no sig change from previous  Cath 05/31/2021 (Care Everywhere): FINAL DIAGNOSIS 1. One-vessel obstructive coronary artery disease (left circumflex) with otherwise mild non-obstructive disease and widely patent prior coronary  stents  2. Mildly elevated left-ventricular end-diastolic pressure   RECOMMENDATIONS 1. Maximize medical therapy.  2. Follow up with primary cardiologist. Consider low dose diuretic therapy given dyspnea and LVEDP.   3. Can consider referral to tertiary care center for PCI of the mid-left circumflex lesion if symptoms warrant.   4. Patient is at low to intermediate risk for upcoming planned ankle surgery. Continue cardiac medications inclusing aspirin daily.   09/2019 vasodilator nuc (report per her Dr. Berenda Morale note):  1.  Myocardial perfusion stress study is positive. There is mild reversible  perfusion defects in the apical segment of the anterior wall, suggestive of  distal LAD myocardial ischemia.  2. Overall left ventricle systolic function is mildly reduced. The overall  calculated left ventricular ejection fraction is 48%. Mild left ventricular  global hypokinesis.  3. No TID. 4. Negative for myocardial ischemia by ECG criteria with low sensitivity.  *My impression is that LVEF 58% from report. Very mild apical lateral ischemia;  SDS 1, no more than 2   [name] [name] [facility] Short Stay Center/Anesthesiology Phone [phone] 06/23/2021 11:09 AM

## 2021-06-24 ENCOUNTER — Telehealth: Payer: Self-pay | Admitting: Orthopedic Surgery

## 2021-06-24 ENCOUNTER — Ambulatory Visit (HOSPITAL_COMMUNITY)
Admission: RE | Admit: 2021-06-24 | Discharge: 2021-06-24 | Disposition: A | Discharge status: Home / Self Care | Payer: No Typology Code available for payment source | Attending: Orthopedic Surgery | Admitting: Orthopedic Surgery

## 2021-06-24 ENCOUNTER — Encounter (HOSPITAL_COMMUNITY): Payer: Self-pay | Admitting: Orthopedic Surgery

## 2021-06-24 ENCOUNTER — Ambulatory Visit (HOSPITAL_COMMUNITY): Payer: No Typology Code available for payment source | Admitting: Physician Assistant

## 2021-06-24 ENCOUNTER — Encounter (HOSPITAL_COMMUNITY)
Admission: RE | Disposition: A | Discharge status: Home / Self Care | Payer: Self-pay | Source: Home / Self Care | Attending: Orthopedic Surgery

## 2021-06-24 ENCOUNTER — Other Ambulatory Visit: Payer: Self-pay | Admitting: Orthopedic Surgery

## 2021-06-24 ENCOUNTER — Other Ambulatory Visit: Payer: Self-pay

## 2021-06-24 DIAGNOSIS — Z87891 Personal history of nicotine dependence: Secondary | ICD-10-CM | POA: Insufficient documentation

## 2021-06-24 DIAGNOSIS — Z7902 Long term (current) use of antithrombotics/antiplatelets: Secondary | ICD-10-CM | POA: Diagnosis not present

## 2021-06-24 DIAGNOSIS — I69354 Hemiplegia and hemiparesis following cerebral infarction affecting left non-dominant side: Secondary | ICD-10-CM | POA: Insufficient documentation

## 2021-06-24 DIAGNOSIS — M9261 Juvenile osteochondrosis of tarsus, right ankle: Secondary | ICD-10-CM | POA: Diagnosis not present

## 2021-06-24 DIAGNOSIS — Z955 Presence of coronary angioplasty implant and graft: Secondary | ICD-10-CM | POA: Diagnosis not present

## 2021-06-24 DIAGNOSIS — Z79899 Other long term (current) drug therapy: Secondary | ICD-10-CM | POA: Diagnosis not present

## 2021-06-24 HISTORY — DX: Malignant (primary) neoplasm, unspecified: C80.1

## 2021-06-24 HISTORY — PX: EXCISION HAGLUND'S DEFORMITY WITH ACHILLES TENDON REPAIR: SHX5627

## 2021-06-24 LAB — BASIC METABOLIC PANEL
Anion gap: 10 (ref 5–15)
BUN: 12 mg/dL (ref 8–23)
CO2: 24 mmol/L (ref 22–32)
Calcium: 9.1 mg/dL (ref 8.9–10.3)
Chloride: 105 mmol/L (ref 98–111)
Creatinine, Ser: 0.74 mg/dL (ref 0.61–1.24)
GFR, Estimated: >60 mL/min (ref >60)
Glucose, Bld: 126 mg/dL — ABNORMAL HIGH (ref 70–99)
Potassium: 4.1 mmol/L (ref 3.5–5.1)
Sodium: 139 mmol/L (ref 135–145)

## 2021-06-24 LAB — CBC
HCT: 36.5 % — ABNORMAL LOW (ref 39.0–52.0)
Hemoglobin: 12.9 g/dL — ABNORMAL LOW (ref 13.0–17.0)
MCH: 31.2 pg (ref 26.0–34.0)
MCHC: 35.3 g/dL (ref 30.0–36.0)
MCV: 88.4 fL (ref 80.0–100.0)
Platelets: 217 10*3/uL (ref 150–400)
RBC: 4.13 MIL/uL — ABNORMAL LOW (ref 4.22–5.81)
RDW: 11.9 % (ref 11.5–15.5)
WBC: 5.7 10*3/uL (ref 4.0–10.5)
nRBC: 0 % (ref 0.0–0.2)

## 2021-06-24 SURGERY — EXCISION HAGLUND'S DEFORMITY WITH ACHILLES TENDON REPAIR
Anesthesia: General | Site: Foot | Laterality: Right

## 2021-06-24 MED ORDER — CEFAZOLIN SODIUM-DEXTROSE 2-4 GM/100ML-% IV SOLN
2.0000 g | INTRAVENOUS | Status: AC
  Administered 2021-06-24: 10:00:00 2 g via INTRAVENOUS
  Filled 2021-06-24: qty 100

## 2021-06-24 MED ORDER — FENTANYL CITRATE (PF) 100 MCG/2ML IJ SOLN: 25.0000 ug | INTRAMUSCULAR | Status: DC | PRN

## 2021-06-24 MED ORDER — OXYCODONE HCL 5 MG PO TABS: 5.0000 mg | ORAL_TABLET | Freq: Once | ORAL | Status: DC | PRN

## 2021-06-24 MED ORDER — HYDROCODONE-ACETAMINOPHEN 5-325 MG PO TABS: 1.0000 | ORAL_TABLET | Freq: Four times a day (QID) | ORAL | 0 refills | Status: DC | PRN

## 2021-06-24 MED ORDER — MIDAZOLAM HCL 2 MG/2ML IJ SOLN: 1.0000 mg | Freq: Once | INTRAMUSCULAR | Status: AC

## 2021-06-24 MED ORDER — LACTATED RINGERS IV SOLN: INTRAVENOUS | Status: DC

## 2021-06-24 MED ORDER — LIDOCAINE 2% (20 MG/ML) 5 ML SYRINGE
INTRAMUSCULAR | Status: DC | PRN
  Administered 2021-06-24: 60 mg via INTRAVENOUS

## 2021-06-24 MED ORDER — CEFAZOLIN SODIUM-DEXTROSE 2-4 GM/100ML-% IV SOLN
INTRAVENOUS | Status: AC
  Filled 2021-06-24: qty 100

## 2021-06-24 MED ORDER — ACETAMINOPHEN 500 MG PO TABS
1000.0000 mg | ORAL_TABLET | Freq: Once | ORAL | Status: AC
  Administered 2021-06-24: 07:00:00 1000 mg via ORAL
  Filled 2021-06-24: qty 2

## 2021-06-24 MED ORDER — 0.9 % SODIUM CHLORIDE (POUR BTL) OPTIME
TOPICAL | Status: DC | PRN
  Administered 2021-06-24: 10:00:00 1000 mL

## 2021-06-24 MED ORDER — BUPIVACAINE-EPINEPHRINE (PF) 0.5% -1:200000 IJ SOLN
INTRAMUSCULAR | Status: DC | PRN
  Administered 2021-06-24: 30 mL via PERINEURAL

## 2021-06-24 MED ORDER — OXYCODONE HCL 5 MG/5ML PO SOLN: 5.0000 mg | Freq: Once | ORAL | Status: DC | PRN

## 2021-06-24 MED ORDER — FENTANYL CITRATE (PF) 100 MCG/2ML IJ SOLN: 50.0000 ug | Freq: Once | INTRAMUSCULAR | Status: AC

## 2021-06-24 MED ORDER — MIDAZOLAM HCL 2 MG/2ML IJ SOLN
INTRAMUSCULAR | Status: AC
  Administered 2021-06-24: 08:00:00 1 mg via INTRAVENOUS
  Filled 2021-06-24: qty 2

## 2021-06-24 MED ORDER — OXYCODONE-ACETAMINOPHEN 5-325 MG PO TABS: 1.0000 | ORAL_TABLET | ORAL | 0 refills | Status: DC | PRN

## 2021-06-24 MED ORDER — ORAL CARE MOUTH RINSE: 15.0000 mL | Freq: Once | OROMUCOSAL | Status: AC

## 2021-06-24 MED ORDER — EPHEDRINE SULFATE-NACL 50-0.9 MG/10ML-% IV SOSY
PREFILLED_SYRINGE | INTRAVENOUS | Status: DC | PRN
  Administered 2021-06-24: 10 mg via INTRAVENOUS

## 2021-06-24 MED ORDER — PROPOFOL 10 MG/ML IV BOLUS
INTRAVENOUS | Status: DC | PRN
  Administered 2021-06-24: 100 mg via INTRAVENOUS

## 2021-06-24 MED ORDER — METOPROLOL TARTRATE 50 MG PO TABS: 50.0000 mg | ORAL_TABLET | Freq: Once | ORAL | Status: AC

## 2021-06-24 MED ORDER — ONDANSETRON HCL 4 MG/2ML IJ SOLN
INTRAMUSCULAR | Status: DC | PRN
  Administered 2021-06-24: 4 mg via INTRAVENOUS

## 2021-06-24 MED ORDER — ONDANSETRON HCL 4 MG/2ML IJ SOLN: 4.0000 mg | Freq: Once | INTRAMUSCULAR | Status: DC | PRN

## 2021-06-24 MED ORDER — METOPROLOL TARTRATE 50 MG PO TABS
ORAL_TABLET | ORAL | Status: AC
  Administered 2021-06-24: 07:00:00 50 mg via ORAL
  Filled 2021-06-24: qty 1

## 2021-06-24 MED ORDER — PHENYLEPHRINE HCL-NACL 20-0.9 MG/250ML-% IV SOLN
INTRAVENOUS | Status: DC | PRN
  Administered 2021-06-24: 40 ug/min via INTRAVENOUS

## 2021-06-24 MED ORDER — DEXAMETHASONE SODIUM PHOSPHATE 10 MG/ML IJ SOLN
INTRAMUSCULAR | Status: DC | PRN
  Administered 2021-06-24: 8 mg via INTRAVENOUS

## 2021-06-24 MED ORDER — BUPIVACAINE HCL (PF) 0.25 % IJ SOLN
INTRAMUSCULAR | Status: AC
  Filled 2021-06-24: qty 30

## 2021-06-24 MED ORDER — CHLORHEXIDINE GLUCONATE 0.12 % MT SOLN
15.0000 mL | Freq: Once | OROMUCOSAL | Status: AC
  Administered 2021-06-24: 07:00:00 15 mL via OROMUCOSAL
  Filled 2021-06-24: qty 15

## 2021-06-24 MED ORDER — FENTANYL CITRATE (PF) 100 MCG/2ML IJ SOLN
INTRAMUSCULAR | Status: AC
  Administered 2021-06-24: 08:00:00 50 ug via INTRAVENOUS
  Filled 2021-06-24: qty 2

## 2021-06-24 SURGICAL SUPPLY — 31 items
BAG COUNTER SPONGE SURGICOUNT (BAG) ×2 IMPLANT
BNDG COHESIVE 6X5 TAN ST LF (GAUZE/BANDAGES/DRESSINGS) ×1 IMPLANT
BNDG COHESIVE 6X5 TAN STRL LF (GAUZE/BANDAGES/DRESSINGS) ×1 IMPLANT
BNDG ESMARK 4X9 LF (GAUZE/BANDAGES/DRESSINGS) ×1 IMPLANT
BNDG GAUZE ELAST 4 BULKY (GAUZE/BANDAGES/DRESSINGS) ×1 IMPLANT
BUR EGG ELITE 4.0 (BURR) ×1 IMPLANT
COVER MAYO STAND STRL (DRAPES) ×2 IMPLANT
COVER SURGICAL LIGHT HANDLE (MISCELLANEOUS) ×2 IMPLANT
CUFF TOURN SGL QUICK 34 (TOURNIQUET CUFF)
CUFF TOURN SGL QUICK 42 (TOURNIQUET CUFF) IMPLANT
CUFF TRNQT CYL 34X4.125X (TOURNIQUET CUFF) IMPLANT
DRAPE U-SHAPE 47X51 STRL (DRAPES) ×2 IMPLANT
DRSG EMULSION OIL 3X3 NADH (GAUZE/BANDAGES/DRESSINGS) ×2 IMPLANT
DRSG PAD ABDOMINAL 8X10 ST (GAUZE/BANDAGES/DRESSINGS) ×1 IMPLANT
DURAPREP 26ML APPLICATOR (WOUND CARE) ×2 IMPLANT
ELECT REM PT RETURN 9FT ADLT (ELECTROSURGICAL) ×2
ELECTRODE REM PT RTRN 9FT ADLT (ELECTROSURGICAL) ×1 IMPLANT
GAUZE SPONGE 4X4 12PLY STRL (GAUZE/BANDAGES/DRESSINGS) ×2 IMPLANT
GLOVE SURG ORTHO LTX SZ9 (GLOVE) ×2 IMPLANT
GLOVE SURG UNDER POLY LF SZ9 (GLOVE) ×2 IMPLANT
GOWN STRL REUS W/ TWL XL LVL3 (GOWN DISPOSABLE) ×2 IMPLANT
GOWN STRL REUS W/TWL XL LVL3 (GOWN DISPOSABLE) ×4
KIT BASIN OR (CUSTOM PROCEDURE TRAY) ×2 IMPLANT
KIT TURNOVER KIT B (KITS) ×2 IMPLANT
PACK ORTHO EXTREMITY (CUSTOM PROCEDURE TRAY) ×2 IMPLANT
PAD ARMBOARD 7.5X6 YLW CONV (MISCELLANEOUS) ×4 IMPLANT
PADDING CAST ABS 4INX4YD NS (CAST SUPPLIES) ×1
PADDING CAST ABS COTTON 4X4 ST (CAST SUPPLIES) ×1 IMPLANT
SUT ETHILON 2 0 PSLX (SUTURE) ×2 IMPLANT
UNDERPAD 30X36 HEAVY ABSORB (UNDERPADS AND DIAPERS) ×2 IMPLANT
WATER STERILE IRR 1000ML POUR (IV SOLUTION) ×2 IMPLANT

## 2021-06-24 NOTE — Progress Notes (Signed)
Orthopedic Tech Progress Note Patient Details:  [name] [name] [birth year] [id number]  Ortho Devices Type of Ortho Device: CAM walker Ortho Device/Splint Location: RLE Ortho Device/Splint Interventions: Ordered, Application   Post Interventions Patient Tolerated: Well  [name] 06/24/2021, 2:33 PM

## 2021-06-24 NOTE — Anesthesia Postprocedure Evaluation (Signed)
Anesthesia Post Note  Patient: [name] [name]  Procedure(s) Performed: RIGHT HAGLUNDS RESECTION (Right: Foot)     Patient location during evaluation: PACU Anesthesia Type: General Level of consciousness: awake and alert Pain management: pain level controlled Vital Signs Assessment: post-procedure vital signs reviewed and stable Respiratory status: spontaneous breathing, nonlabored ventilation and respiratory function stable Cardiovascular status: stable and blood pressure returned to baseline Anesthetic complications: no   No notable events documented.  Last Vitals:  Vitals:   06/24/21 1045 06/24/21 1100  BP: 120/86 133/76  Pulse: (!) 52 (!) 56  Resp: 18 18  Temp:  36.6 C  SpO2: 97% 100%    Last Pain:  Vitals:   06/24/21 1100  TempSrc:   PainSc: 0-No pain                 Beryle Lathe

## 2021-06-24 NOTE — Telephone Encounter (Signed)
[facility] pharmacy calling on behalf of the pt to get a new prescription sent in. Pharmacy states they do not have any Percocet in supply so they will need to get Dr. Lajoyce Corners to send in a new prescription for the pt to take. The best pharmacy is still [facility] [address] [facility], [place] - [address].

## 2021-06-24 NOTE — Op Note (Signed)
06/24/2021  10:34 AM  PATIENT:  [name] [name]    PRE-OPERATIVE DIAGNOSIS:  Haglunds Deformity Right Ankle  POST-OPERATIVE DIAGNOSIS:  Same  PROCEDURE:  RIGHT HAGLUNDS RESECTION  SURGEON:  Nadara Mustard, MD  PHYSICIAN ASSISTANT:None ANESTHESIA:   General  PREOPERATIVE INDICATIONS:  [name] is a  76 y.o. male with a diagnosis of Haglunds Deformity Right Ankle who failed conservative measures and elected for surgical management.    The risks benefits and alternatives were discussed with the patient preoperatively including but not limited to the risks of infection, bleeding, nerve injury, cardiopulmonary complications, the need for revision surgery, among others, and the patient was willing to proceed.  OPERATIVE IMPLANTS: None  @ENCIMAGES @  OPERATIVE FINDINGS: Haglund's deformity resected under direct visualization.  Achilles tendon insertion intact.  OPERATIVE PROCEDURE: Patient was brought the operating room and underwent a general anesthetic.  After adequate levels anesthesia were obtained patient's right lower extremity was prepped using DuraPrep draped into a sterile field a timeout was called.  A posterior low medial longitudinal incision was made over the medial aspect of the Achilles.  Dissection was carried down to bone.  Subperiosteal dissection was used to resect and elevate the Achilles off the Haglund's deformity.  Retractors were placed to protect the lateral and deep structures as well as the Achilles.  The rem B was then used with a 4 mm bur to resect the Haglund's deformity.  The wound was irrigated the deformity was resected the insertion of the Achilles was intact.  After irrigation electrocardio is used for hemostasis.  An Allgower Donati suture technique was used with 2-0 nylon to repair the incision.  Sterile dressing was applied patient was extubated taken the PACU in stable condition.   DISCHARGE PLANNING:  Antibiotic duration: Preoperative  antibiotics  Weightbearing: Touchdown weightbearing on the right  Pain medication: Percocet  Dressing care/ Wound VAC: Reinforce dressing as needed  Ambulatory devices: Crutches  Discharge to: Home.  Follow-up: In the office 1 week post operative.

## 2021-06-24 NOTE — Telephone Encounter (Signed)
Pt had surgery today on right haglunds resection.

## 2021-06-24 NOTE — Anesthesia Procedure Notes (Signed)
Procedure Name: LMA Insertion Date/Time: 06/24/2021 9:45 AM Performed by: Darryl Nestle, CRNA Pre-anesthesia Checklist: Patient identified, Emergency Drugs available, Suction available and Patient being monitored Patient Re-evaluated:Patient Re-evaluated prior to induction Oxygen Delivery Method: Circle system utilized Preoxygenation: Pre-oxygenation with 100% oxygen Induction Type: IV induction LMA: LMA inserted LMA Size: 4.0 Tube type: Oral Number of attempts: 1 Placement Confirmation: positive ETCO2 and breath sounds checked- equal and bilateral Tube secured with: Tape Dental Injury: Teeth and Oropharynx as per pre-operative assessment

## 2021-06-24 NOTE — Anesthesia Procedure Notes (Signed)
Anesthesia Regional Block: Popliteal block   Pre-Anesthetic Checklist: , timeout performed,  Correct Patient, Correct Site, Correct Laterality,  Correct Procedure, Correct Position, site marked,  Risks and benefits discussed,  Surgical consent,  Pre-op evaluation,  At surgeon's request and post-op pain management  Laterality: Right  Prep: chloraprep       Needles:  Injection technique: Single-shot  Needle Type: Echogenic Needle     Needle Length: 10cm  Needle Gauge: 21     Additional Needles:   Narrative:  Start time: 06/24/2021 8:07 AM End time: 06/24/2021 8:11 AM Injection made incrementally with aspirations every 5 mL.  Performed by: Personally  Anesthesiologist: Beryle Lathe, MD  Additional Notes: No pain on injection. No increased resistance to injection. Injection made in 5cc increments. Good needle visualization. Patient tolerated the procedure well.

## 2021-06-24 NOTE — Telephone Encounter (Signed)
I left voicemail for patient advising. 

## 2021-06-24 NOTE — H&P (Signed)
[name] [name] is an 75 y.o. male.   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 HPI: Patient is a 75 year old gentleman who was seen in referral from Dr. Ophelia Charter.  Patient states that he had a work-related injury with CO2 canisters that struck his heel and he is undergone 1 year of conservative therapy without relief.  Patient states that he cannot perform activities of daily living secondary to pain over the Haglund's deformity and currently uses a fracture boot for ambulation.  Patient is on Plavix for 3 cardiac stents patient states that he takes Neurontin for symptoms from a stroke that involves the left side.  Past Medical History:  Diagnosis Date   Anxiety    Cancer (HCC)    skin cancer   Carpal tunnel syndrome    Coronary artery disease    Depression    Hepatitis    Hepatitis - never been treated   Hypercholesteremia    Hypertension    Myocardial infarction [facility]) 2010ish   Pneumonia    PTSD (post-traumatic stress disorder)    PTSD (post-traumatic stress disorder)    Sleep apnea     doesnt use CPAP   Stroke (HCC)    slight weakness left- 85- 90 % returned    Past Surgical History:  Procedure Laterality Date   CARDIAC CATHETERIZATION     CARPAL TUNNEL RELEASE Right 06/10/2013   Procedure: RIGHT CARPAL TUNNEL RELEASE;  Surgeon: Kathryne Hitch, MD;  Location: [facility] OR;  Service: Orthopedics;  Laterality: Right;   CORONARY ANGIOPLASTY     EYE SURGERY Bilateral    cataract   KNEE ARTHROSCOPY Right    KNEE ARTHROSCOPY Left 08/04/2019   Procedure: LEFT KNEE ARTHROSCOPY WITH PARTIAL MEDIAL MENISCECTOMY;  Surgeon: Kathryne Hitch, MD;  Location: [facility];  Service: Orthopedics;  Laterality: Left;   NASAL SINUS SURGERY     stents  2012    Family History  Problem Relation Age of Onset   Heart disease Mother        Several other maternal family members   Lung cancer Father        small cell CA/smoked   Social History:   reports that he quit smoking about 33 years ago. His smoking use included cigarettes. He has a 40.00 pack-year smoking history. He has never used smokeless tobacco. He reports that he does not drink alcohol and does not use drugs.  Allergies:  Allergies  Allergen Reactions   Dilaudid [Hydromorphone Hcl] Other (See Comments)    "CRASHES" per patient Talked to nurse and she said pt is afraid to take dilaudid because pt states he flat-lined when they gave it to him.Marland KitchenMarland KitchenPt doesn't mind taking Percocet    Clindamycin/Lincomycin Other (See Comments)    Blisters in mouth    Medications Prior to Admission  Medication Sig Dispense Refill   aspirin EC 81 MG tablet Take 81 mg by mouth daily.     atorvastatin (LIPITOR) 80 MG tablet Take 80 mg by mouth daily.     buPROPion (WELLBUTRIN SR) 150 MG 12 hr tablet Take 150 mg by mouth daily.     DULoxetine (CYMBALTA) 30 MG capsule Take 30 mg by mouth daily.     gabapentin (NEURONTIN) 100 MG capsule Take 100 mg by mouth 2 (two) times daily.     lisinopril (PRINIVIL,ZESTRIL) 10 MG tablet Take 10 mg by mouth daily.     metoprolol tartrate (LOPRESSOR) 50 MG tablet Take 25 mg by mouth 2 (  two) times daily.     Multiple Vitamin (MULTIVITAMIN) tablet Take 1 tablet by mouth daily.     OVER THE COUNTER MEDICATION Apply 1 application topically at bedtime. Magnilife DB     pregabalin (LYRICA) 100 MG capsule Take 100 mg by mouth daily.     tamsulosin (FLOMAX) 0.4 MG CAPS capsule Take 0.4 mg by mouth daily.      No results found for this or any previous visit (from the past 48 hour(s)). No results found.  Review of Systems  All other systems reviewed and are negative.  Blood pressure (!) 153/86, pulse 61, temperature 98.3 F (36.8 C), temperature source Oral, resp. rate 18, height 5\' 10"  (1.778 m), weight 95.3 kg, SpO2 98 %. Physical Exam  Patient is alert, oriented, no adenopathy, well-dressed, normal affect, normal respiratory effort. Examination patient has a  good dorsalis pedis pulse he has good ankle and subtalar motion.  With his knee extended he lacks 20 degrees of dorsiflexion to neutral.  He has a palpable Haglund's deformity which is tender to palpation he also has some tenderness to palpation along the Achilles tendon there is no palpable defects compression of the calf reproduces plantarflexion of the foot. Assessment/Plan 1. Calcific Achilles tendonitis   2. Haglund's deformity of right heel       Plan: Due to failure of conservative treatment patient would like to proceed with surgical intervention.  We will plan for open excision of the Haglund's deformity as well as debridement of the calcified Achilles.  Risks and benefits were discussed including rupture of the Achilles revision of the Achilles insertion and difficulty with skin healing.  Discussed the importance of strict nonweightbearing postoperatively to minimize risks of postoperative wound complications.  Patient states he understands wished to proceed at this time.  , MD 06/24/2021, 7:09 AM

## 2021-06-24 NOTE — Transfer of Care (Signed)
Immediate Anesthesia Transfer of Care Note  Patient: [name] [name]  Procedure(s) Performed: RIGHT HAGLUNDS RESECTION (Right: Foot)  Patient Location: PACU  Anesthesia Type:GA combined with regional for post-op pain  Level of Consciousness: awake, alert  and oriented  Airway & Oxygen Therapy: Patient Spontanous Breathing and Patient connected to nasal cannula oxygen  Post-op Assessment: Report given to RN and Post -op Vital signs reviewed and stable  Post vital signs: Reviewed and stable  Last Vitals:  Vitals Value Taken Time  BP 150/90 06/24/21 1030  Temp    Pulse 56 06/24/21 1032  Resp 14 06/24/21 1032  SpO2 100 % 06/24/21 1032  Vitals shown include unvalidated device data.  Last Pain:  Vitals:   06/24/21 0654  TempSrc:   PainSc: 0-No pain         Complications: No notable events documented.

## 2021-06-25 ENCOUNTER — Encounter (HOSPITAL_COMMUNITY): Payer: Self-pay | Admitting: Orthopedic Surgery

## 2021-07-01 ENCOUNTER — Ambulatory Visit (INDEPENDENT_AMBULATORY_CARE_PROVIDER_SITE_OTHER): Payer: No Typology Code available for payment source | Admitting: Family

## 2021-07-01 DIAGNOSIS — M9261 Juvenile osteochondrosis of tarsus, right ankle: Secondary | ICD-10-CM

## 2021-07-05 ENCOUNTER — Encounter: Payer: Self-pay | Admitting: Family

## 2021-07-05 NOTE — Progress Notes (Signed)
Post-Op Visit Note   Patient: [name] [name]           Date of Birth: Apr 30, 1946           MRN: [id number] Visit Date: 07/01/2021 PCP: Louanne Skye, MD  Chief Complaint:  Chief Complaint  Patient presents with   Right Achilles Tendon - Routine Post Op    06/24/21 right haglund's resection     HPI:  HPI The patient is a 76 year old gentleman seen status post right Haglund's resection he has been walking with 1 crutch in his fracture boot.  He feels well no concerns Ortho Exam Incision well approximated sutures there is no gaping no drainage no erythema no sign of infection  Visit Diagnoses: No diagnosis found.  Plan: Begin daily dose of cleansing.  Dry dressing changes.  Continue CAM Walker.  We will follow-up in 1 week for suture removal.  Follow-Up Instructions: No follow-ups on file.   Imaging: No results found.  Orders:  No orders of the defined types were placed in this encounter.  No orders of the defined types were placed in this encounter.    PMFS History: Patient Active Problem List   Diagnosis Date Noted   Haglund's deformity of right heel    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   Status post arthroscopy of left knee 08/12/2019   Complex tear of medial meniscus of left knee 08/04/2019   Chest pain 05/27/2014   Left-sided chest wall pain 01/28/2014   Upper airway cough syndrome 01/28/2014   Arteriosclerosis of coronary artery 09/22/2013   HLD (hyperlipidemia) 09/22/2013   BP (high blood pressure) 09/22/2013   Upper respiratory infection 07/25/2013   Dehydration 07/25/2013   Carpal tunnel syndrome, right 06/10/2013   H/O malignant neoplasm of skin 03/19/2013   Atypical chest pain 04/13/2012   Slurred speech 04/13/2012   Blurred vision, bilateral 04/13/2012   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   TIA (transient ischemic attack) 11/22/2011   Volume depletion 11/22/2011   Aphasia 11/21/2011   Musculoskeletal chest pain 11/21/2011    Normocytic anemia 11/21/2011   Thrombocytopenia (HCC) 11/21/2011   Metabolic encephalopathy 11/21/2011   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 06/05/2011   CAD (coronary artery disease) 06/05/2011   Past Medical History:  Diagnosis Date   Anxiety    Cancer (HCC)    skin cancer   Carpal tunnel syndrome    Coronary artery disease    Depression    Hepatitis    Hepatitis - never been treated   Hypercholesteremia    Hypertension    Myocardial infarction [facility]) 2010ish   Pneumonia    PTSD (post-traumatic stress disorder)    PTSD (post-traumatic stress disorder)    Sleep apnea     doesnt use CPAP   Stroke (HCC)    slight weakness left- 85- 90 % returned    Family History  Problem Relation Age of Onset   Heart disease Mother        Several other maternal family members   Lung cancer Father        small cell CA/smoked    Past Surgical History:  Procedure Laterality Date   CARDIAC CATHETERIZATION     CARPAL TUNNEL RELEASE Right 06/10/2013   Procedure: RIGHT CARPAL TUNNEL RELEASE;  Surgeon: Kathryne Hitch, MD;  Location: [facility] OR;  Service: Orthopedics;  Laterality: Right;   CORONARY ANGIOPLASTY     EXCISION HAGLUND'S DEFORMITY WITH ACHILLES TENDON REPAIR Right 06/24/2021   Procedure: RIGHT HAGLUNDS RESECTION;  Surgeon: Nadara Mustard, MD;  Location: [facility] OR;  Service: Orthopedics;  Laterality: Right;   EYE SURGERY Bilateral    cataract   KNEE ARTHROSCOPY Right    KNEE ARTHROSCOPY Left 08/04/2019   Procedure: LEFT KNEE ARTHROSCOPY WITH PARTIAL MEDIAL MENISCECTOMY;  Surgeon: Kathryne Hitch, MD;  Location: [facility];  Service: Orthopedics;  Laterality: Left;   NASAL SINUS SURGERY     stents  2012   Social History   Occupational History   Occupation: Retired Hotel manager   Tobacco Use   Smoking status: Former    Packs/day: 2.00    Years: 20.00    Pack years: 40.00    Types: Cigarettes    Quit date: 11/21/1987    Years since  quitting: 33.6   Smokeless tobacco: Never  Vaping Use   Vaping Use: Never used  Substance and Sexual Activity   Alcohol use: No   Drug use: No   Sexual activity: Not on file

## 2021-07-08 ENCOUNTER — Encounter: Payer: Self-pay | Admitting: Family

## 2021-07-08 ENCOUNTER — Ambulatory Visit (INDEPENDENT_AMBULATORY_CARE_PROVIDER_SITE_OTHER): Payer: No Typology Code available for payment source | Admitting: Family

## 2021-07-08 DIAGNOSIS — M6528 Calcific tendinitis, other site: Secondary | ICD-10-CM

## 2021-07-08 DIAGNOSIS — M9261 Juvenile osteochondrosis of tarsus, right ankle: Secondary | ICD-10-CM

## 2021-07-08 DIAGNOSIS — M7660 Achilles tendinitis, unspecified leg: Secondary | ICD-10-CM

## 2021-07-08 NOTE — Progress Notes (Signed)
Post-Op Visit Note   Patient: [name] [name]           Date of Birth: 12-Aug-1945           MRN: [id number] Visit Date: 07/08/2021 PCP: Louanne Skye, MD  Chief Complaint:  No chief complaint on file.   HPI:  HPI The patient is a 76 year old gentleman seen status post right Haglund's resection. he has been walking in crocs without assistive device.  Ortho Exam Incision well approximated sutures. Is well healed. there is no gaping no drainage no erythema no sign of infection  Visit Diagnoses: No diagnosis found.  Plan: sutures harvested today. May resume regular shoe wear.  Follow-Up Instructions: Return in about 13 days (around 07/21/2021).   Imaging: No results found.  Orders:  No orders of the defined types were placed in this encounter.  No orders of the defined types were placed in this encounter.    PMFS History: Patient Active Problem List   Diagnosis Date Noted   Haglund's deformity of right heel    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   Status post arthroscopy of left knee 08/12/2019   Complex tear of medial meniscus of left knee 08/04/2019   Chest pain 05/27/2014   Left-sided chest wall pain 01/28/2014   Upper airway cough syndrome 01/28/2014   Arteriosclerosis of coronary artery 09/22/2013   HLD (hyperlipidemia) 09/22/2013   BP (high blood pressure) 09/22/2013   Upper respiratory infection 07/25/2013   Dehydration 07/25/2013   Carpal tunnel syndrome, right 06/10/2013   H/O malignant neoplasm of skin 03/19/2013   Atypical chest pain 04/13/2012   Slurred speech 04/13/2012   Blurred vision, bilateral 04/13/2012   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   TIA (transient ischemic attack) 11/22/2011   Volume depletion 11/22/2011   Aphasia 11/21/2011   Musculoskeletal chest pain 11/21/2011   Normocytic anemia 11/21/2011   Thrombocytopenia (HCC) 11/21/2011   Metabolic encephalopathy 11/21/2011   CVA (cerebral infarction) with history of  left hemiplegia 06/05/2011   CAD (coronary artery disease) 06/05/2011   Past Medical History:  Diagnosis Date   Anxiety    Cancer (HCC)    skin cancer   Carpal tunnel syndrome    Coronary artery disease    Depression    Hepatitis    Hepatitis - never been treated   Hypercholesteremia    Hypertension    Myocardial infarction [facility]) 2010ish   Pneumonia    PTSD (post-traumatic stress disorder)    PTSD (post-traumatic stress disorder)    Sleep apnea     doesnt use CPAP   Stroke (HCC)    slight weakness left- 85- 90 % returned    Family History  Problem Relation Age of Onset   Heart disease Mother        Several other maternal family members   Lung cancer Father        small cell CA/smoked    Past Surgical History:  Procedure Laterality Date   CARDIAC CATHETERIZATION     CARPAL TUNNEL RELEASE Right 06/10/2013   Procedure: RIGHT CARPAL TUNNEL RELEASE;  Surgeon: Kathryne Hitch, MD;  Location: [facility] OR;  Service: Orthopedics;  Laterality: Right;   CORONARY ANGIOPLASTY     EXCISION HAGLUND'S DEFORMITY WITH ACHILLES TENDON REPAIR Right 06/24/2021   Procedure: RIGHT HAGLUNDS RESECTION;  Surgeon: Nadara Mustard, MD;  Location: [facility] OR;  Service: Orthopedics;  Laterality: Right;   EYE SURGERY Bilateral    cataract   KNEE ARTHROSCOPY Right    KNEE ARTHROSCOPY  Left 08/04/2019   Procedure: LEFT KNEE ARTHROSCOPY WITH PARTIAL MEDIAL MENISCECTOMY;  Surgeon: Kathryne Hitch, MD;  Location: [facility];  Service: Orthopedics;  Laterality: Left;   NASAL SINUS SURGERY     stents  2012   Social History   Occupational History   Occupation: Retired Hotel manager   Tobacco Use   Smoking status: Former    Packs/day: 2.00    Years: 20.00    Pack years: 40.00    Types: Cigarettes    Quit date: 11/21/1987    Years since quitting: 33.6   Smokeless tobacco: Never  Vaping Use   Vaping Use: Never used  Substance and Sexual Activity   Alcohol use: No   Drug use: No    Sexual activity: Not on file

## 2021-07-22 ENCOUNTER — Encounter: Payer: No Typology Code available for payment source | Admitting: Family

## 2021-07-29 ENCOUNTER — Other Ambulatory Visit: Payer: Self-pay

## 2021-07-29 ENCOUNTER — Ambulatory Visit (INDEPENDENT_AMBULATORY_CARE_PROVIDER_SITE_OTHER): Payer: No Typology Code available for payment source | Admitting: Family

## 2021-07-29 ENCOUNTER — Encounter: Payer: Self-pay | Admitting: Family

## 2021-07-29 DIAGNOSIS — M9261 Juvenile osteochondrosis of tarsus, right ankle: Secondary | ICD-10-CM

## 2021-07-29 DIAGNOSIS — Z9889 Other specified postprocedural states: Secondary | ICD-10-CM

## 2021-07-29 MED ORDER — NITROGLYCERIN 0.2 MG/HR TD PT24: 0.2000 mg | MEDICATED_PATCH | Freq: Every day | TRANSDERMAL | 12 refills | Status: DC

## 2021-08-03 ENCOUNTER — Telehealth: Payer: Self-pay | Admitting: Family

## 2021-08-03 NOTE — Telephone Encounter (Signed)
I called and lm on vm to advise would need eval in office for any post surgical issues he is having we can see him this afternoon or anytime to morrow will just need to let us know when he is able to come in.

## 2021-08-03 NOTE — Progress Notes (Signed)
Post-Op Visit Note   Patient: [name] [name]           Date of Birth: [birth year]           MRN: [id number] Visit Date: 07/29/2021 PCP: Louanne Skye, MD  Chief Complaint:  Chief Complaint  Patient presents with   Right Foot - Routine Post Op     HPI:  HPI The patient is a 76 year old gentleman seen status post right Haglund's resection.  States he was walking in his yard about 2 weeks ago and stepped in a hole and had a plantarflexion injury of his right foot and ankle he has had significantly worse pain since.  At the time he felt a pulling in his Achilles he feels as if there is a range inside his heel he has intense pain with ambulation in regular shoewear  Ortho Exam On examination of the right foot his incisions are well-healed he does have no palpable cords or defects of his Achilles however he is significantly tender of his insertion of the Achilles and posterior calcaneus. Neg thompson test.  Visit Diagnoses: No diagnosis found.  Plan: given 2 9/16 heel lifts. Will follow up in 2 weeks.   Follow-Up Instructions: Return in about 2 weeks (around 08/12/2021).   Imaging: No results found.  Orders:  No orders of the defined types were placed in this encounter.  Meds ordered this encounter  Medications   nitroGLYCERIN (NITRODUR - DOSED IN MG/24 HR) 0.2 mg/hr patch    Sig: Place 1 patch (0.2 mg total) onto the skin daily.    Dispense:  30 patch    Refill:  12      PMFS History: Patient Active Problem List   Diagnosis Date Noted   Haglund's deformity of right heel    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   Status post arthroscopy of left knee 08/12/2019   Complex tear of medial meniscus of left knee 08/04/2019   Chest pain 05/27/2014   Left-sided chest wall pain 01/28/2014   Upper airway cough syndrome 01/28/2014   Arteriosclerosis of coronary artery 09/22/2013   HLD (hyperlipidemia) 09/22/2013   BP (high blood pressure) 09/22/2013   Upper respiratory  infection 07/25/2013   Dehydration 07/25/2013   Carpal tunnel syndrome, right 06/10/2013   H/O malignant neoplasm of skin 03/19/2013   Atypical chest pain 04/13/2012   Slurred speech 04/13/2012   Blurred vision, bilateral 04/13/2012   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   TIA (transient ischemic attack) 11/22/2011   Volume depletion 11/22/2011   Aphasia 11/21/2011   Musculoskeletal chest pain 11/21/2011   Normocytic anemia 11/21/2011   Thrombocytopenia (HCC) 11/21/2011   Metabolic encephalopathy 11/21/2011   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 06/05/2011   CAD (coronary artery disease) 06/05/2011   Past Medical History:  Diagnosis Date   Anxiety    Cancer (HCC)    skin cancer   Carpal tunnel syndrome    Coronary artery disease    Depression    Hepatitis    Hepatitis - never been treated   Hypercholesteremia    Hypertension    Myocardial infarction [facility]) 2010ish   Pneumonia    PTSD (post-traumatic stress disorder)    PTSD (post-traumatic stress disorder)    Sleep apnea     doesnt use CPAP   Stroke (HCC)    slight weakness left- 85- 90 % returned    Family History  Problem Relation Age of Onset   Heart disease Mother  Several other maternal family members   Lung cancer Father        small cell CA/smoked    Past Surgical History:  Procedure Laterality Date   CARDIAC CATHETERIZATION     CARPAL TUNNEL RELEASE Right 06/10/2013   Procedure: RIGHT CARPAL TUNNEL RELEASE;  Surgeon: Kathryne Hitch, MD;  Location: [facility] OR;  Service: Orthopedics;  Laterality: Right;   CORONARY ANGIOPLASTY     EXCISION HAGLUND'S DEFORMITY WITH ACHILLES TENDON REPAIR Right 06/24/2021   Procedure: RIGHT HAGLUNDS RESECTION;  Surgeon: Nadara Mustard, MD;  Location: [facility] OR;  Service: Orthopedics;  Laterality: Right;   EYE SURGERY Bilateral    cataract   KNEE ARTHROSCOPY Right    KNEE ARTHROSCOPY Left 08/04/2019   Procedure: LEFT KNEE ARTHROSCOPY WITH  PARTIAL MEDIAL MENISCECTOMY;  Surgeon: Kathryne Hitch, MD;  Location: [facility];  Service: Orthopedics;  Laterality: Left;   NASAL SINUS SURGERY     stents  2012   Social History   Occupational History   Occupation: Retired Hotel manager   Tobacco Use   Smoking status: Former    Packs/day: 2.00    Years: 20.00    Pack years: 40.00    Types: Cigarettes    Quit date: 11/21/1987    Years since quitting: 33.7   Smokeless tobacco: Never  Vaping Use   Vaping Use: Never used  Substance and Sexual Activity   Alcohol use: No   Drug use: No   Sexual activity: Not on file

## 2021-08-03 NOTE — Telephone Encounter (Signed)
Patient called. Says he is still having trouble with his surgery area. I offered the appointment time for today so he could come in and have it looked at. He says he can not come today. Just want [name] [name] to call him. His call back number is [phone]

## 2021-08-04 ENCOUNTER — Ambulatory Visit (INDEPENDENT_AMBULATORY_CARE_PROVIDER_SITE_OTHER): Payer: No Typology Code available for payment source | Admitting: Orthopedic Surgery

## 2021-08-04 ENCOUNTER — Encounter: Payer: Self-pay | Admitting: Orthopedic Surgery

## 2021-08-04 ENCOUNTER — Other Ambulatory Visit: Payer: Self-pay

## 2021-08-04 DIAGNOSIS — M9261 Juvenile osteochondrosis of tarsus, right ankle: Secondary | ICD-10-CM

## 2021-08-04 MED ORDER — DOXYCYCLINE HYCLATE 100 MG PO TABS: 100.0000 mg | ORAL_TABLET | Freq: Two times a day (BID) | ORAL | 0 refills | Status: DC

## 2021-08-04 NOTE — Progress Notes (Signed)
Office Visit Note   Patient: [name] [name]           Date of Birth: 1945-11-04           MRN: [id number] Visit Date: 08/04/2021              Requested by: Louanne Skye, MD [address] [place],  [place] [address] PCP: Louanne Skye, MD  Chief Complaint  Patient presents with   Right Foot - Routine Post Op    06/24/21 right Haglunds resection        HPI: Patient is a 76 year old gentleman presents follow-up status post Haglund's resection he states he now has increased pain.  He is about 5 weeks out from surgery.  Complains of increased swelling and warmth and tenderness to touch.  Assessment & Plan: Visit Diagnoses:  1. Haglund's deformity of right heel     Plan: We will start doxycycline 100 mg twice a day recommended range of motion of the ankle and toes and elevation to decrease swelling.  Follow-Up Instructions: Return in about 1 week (around 08/11/2021).   Ortho Exam  Patient is alert, oriented, no adenopathy, well-dressed, normal affect, normal respiratory effort. Patient does have increased swelling with some mild redness the incision is well-healed.  He does have tenderness to light touch.  Concerning for cellulitis.  Imaging: No results found. No images are attached to the encounter.  Labs: Lab Results  Component Value Date   HGBA1C 5.8 (H) 04/13/2012   HGBA1C 5.6 11/21/2011   HGBA1C 5.8 (H) 06/06/2011   ESRSEDRATE 30 (H) 02/28/2016   ESRSEDRATE 19 01/28/2014   REPTSTATUS 11/23/2011 FINAL 11/21/2011   CULT NO GROWTH 11/21/2011     Lab Results  Component Value Date   ALBUMIN 3.7 07/29/2020   ALBUMIN 3.8 06/28/2020   ALBUMIN 3.8 02/28/2016    Lab Results  Component Value Date   MG 2.0 04/12/2012   No results found for: VD25OH  No results found for: PREALBUMIN CBC EXTENDED Latest Ref Rng & Units 06/24/2021 07/29/2020 07/29/2020  WBC 4.0 - 10.5 K/uL 5.7 - 6.3  RBC 4.22 - 5.81 MIL/uL 4.13(L) - 3.82(L)  HGB 13.0 - 17.0 g/dL 12.9(L) 10.9(L)  11.7(L)  HCT 39.0 - 52.0 % 36.5(L) 32.0(L) 33.7(L)  PLT 150 - 400 K/uL 217 - 160  NEUTROABS 1.7 - 7.7 K/uL - - -  LYMPHSABS 0.7 - 4.0 K/uL - - -     There is no height or weight on file to calculate BMI.  Orders:  No orders of the defined types were placed in this encounter.  No orders of the defined types were placed in this encounter.    Procedures: No procedures performed  Clinical Data: No additional findings.  ROS:  All other systems negative, except as noted in the HPI. Review of Systems  Objective: Vital Signs: There were no vitals taken for this visit.  Specialty Comments:  No specialty comments available.  PMFS History: Patient Active Problem List   Diagnosis Date Noted   Haglund's deformity of right heel    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   Status post arthroscopy of left knee 08/12/2019   Complex tear of medial meniscus of left knee 08/04/2019   Chest pain 05/27/2014   Left-sided chest wall pain 01/28/2014   Upper airway cough syndrome 01/28/2014   Arteriosclerosis of coronary artery 09/22/2013   HLD (hyperlipidemia) 09/22/2013   BP (high blood pressure) 09/22/2013   Upper respiratory infection 07/25/2013   Dehydration 07/25/2013  Carpal tunnel syndrome, right 06/10/2013   H/O malignant neoplasm of skin 03/19/2013   Atypical chest pain 04/13/2012   Slurred speech 04/13/2012   Blurred vision, bilateral 04/13/2012   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   TIA (transient ischemic attack) 11/22/2011   Volume depletion 11/22/2011   Aphasia 11/21/2011   Musculoskeletal chest pain 11/21/2011   Normocytic anemia 11/21/2011   Thrombocytopenia (HCC) 11/21/2011   Metabolic encephalopathy 11/21/2011   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 06/05/2011   CAD (coronary artery disease) 06/05/2011   Past Medical History:  Diagnosis Date   Anxiety    Cancer (HCC)    skin cancer   Carpal tunnel syndrome    Coronary  artery disease    Depression    Hepatitis    Hepatitis - never been treated   Hypercholesteremia    Hypertension    Myocardial infarction [facility]) 2010ish   Pneumonia    PTSD (post-traumatic stress disorder)    PTSD (post-traumatic stress disorder)    Sleep apnea     doesnt use CPAP   Stroke (HCC)    slight weakness left- 85- 90 % returned    Family History  Problem Relation Age of Onset   Heart disease Mother        Several other maternal family members   Lung cancer Father        small cell CA/smoked    Past Surgical History:  Procedure Laterality Date   CARDIAC CATHETERIZATION     CARPAL TUNNEL RELEASE Right 06/10/2013   Procedure: RIGHT CARPAL TUNNEL RELEASE;  Surgeon: Kathryne Hitch, MD;  Location: [facility] OR;  Service: Orthopedics;  Laterality: Right;   CORONARY ANGIOPLASTY     EXCISION HAGLUND'S DEFORMITY WITH ACHILLES TENDON REPAIR Right 06/24/2021   Procedure: RIGHT HAGLUNDS RESECTION;  Surgeon: Nadara Mustard, MD;  Location: [facility] OR;  Service: Orthopedics;  Laterality: Right;   EYE SURGERY Bilateral    cataract   KNEE ARTHROSCOPY Right    KNEE ARTHROSCOPY Left 08/04/2019   Procedure: LEFT KNEE ARTHROSCOPY WITH PARTIAL MEDIAL MENISCECTOMY;  Surgeon: Kathryne Hitch, MD;  Location: [facility];  Service: Orthopedics;  Laterality: Left;   NASAL SINUS SURGERY     stents  2012   Social History   Occupational History   Occupation: Retired Hotel manager   Tobacco Use   Smoking status: Former    Packs/day: 2.00    Years: 20.00    Pack years: 40.00    Types: Cigarettes    Quit date: 11/21/1987    Years since quitting: 33.7   Smokeless tobacco: Never  Vaping Use   Vaping Use: Never used  Substance and Sexual Activity   Alcohol use: No   Drug use: No   Sexual activity: Not on file

## 2021-08-11 ENCOUNTER — Other Ambulatory Visit: Payer: Self-pay

## 2021-08-11 ENCOUNTER — Ambulatory Visit (INDEPENDENT_AMBULATORY_CARE_PROVIDER_SITE_OTHER): Payer: No Typology Code available for payment source | Admitting: Orthopedic Surgery

## 2021-08-11 DIAGNOSIS — M9261 Juvenile osteochondrosis of tarsus, right ankle: Secondary | ICD-10-CM

## 2021-08-14 ENCOUNTER — Encounter: Payer: Self-pay | Admitting: Orthopedic Surgery

## 2021-08-14 NOTE — Progress Notes (Signed)
Office Visit Note   Patient: [name] [name]           Date of Birth: Sep 04, 1945           MRN: [id number] Visit Date: 08/11/2021              Requested by: Benito Mccreedy, MD [address] [place],  [place] [address] PCP: Benito Mccreedy, MD  Chief Complaint  Patient presents with   Right Foot - Routine Post Op    06/24/21 right haglund's resection       HPI: Patient is a 76 year old gentleman who is 6 weeks status post Haglund's resection on the right.  He was on doxycycline due to increased swelling and redness.  Patient states he has pain to touch and pain with weightbearing.  Assessment & Plan: Visit Diagnoses:  1. Haglund's deformity of right heel     Plan: Continue with open heeled shoes continue with range of motion of the ankle continue with elevation and compression as well as range of motion of the toes and ankle.  Follow-Up Instructions: Return in about 4 weeks (around 09/08/2021).   Ortho Exam  Patient is alert, oriented, no adenopathy, well-dressed, normal affect, normal respiratory effort. Examination the incision is well-healed there is no cellulitis no drainage no wound dehiscence there is some swelling posteriorly.  No clinical signs of infection.  He will complete his course of doxycycline.  Imaging: No results found. No images are attached to the encounter.  Labs: Lab Results  Component Value Date   HGBA1C 5.8 (H) 04/13/2012   HGBA1C 5.6 11/21/2011   HGBA1C 5.8 (H) 06/06/2011   ESRSEDRATE 30 (H) 02/28/2016   ESRSEDRATE 19 01/28/2014   REPTSTATUS 11/23/2011 FINAL 11/21/2011   CULT NO GROWTH 11/21/2011     Lab Results  Component Value Date   ALBUMIN 3.7 07/29/2020   ALBUMIN 3.8 06/28/2020   ALBUMIN 3.8 02/28/2016    Lab Results  Component Value Date   MG 2.0 04/12/2012   No results found for: VD25OH  No results found for: PREALBUMIN CBC EXTENDED Latest Ref Rng & Units 06/24/2021 07/29/2020 07/29/2020  WBC 4.0 - 10.5 K/uL 5.7 - 6.3  RBC  4.22 - 5.81 MIL/uL 4.13(L) - 3.82(L)  HGB 13.0 - 17.0 g/dL 12.9(L) 10.9(L) 11.7(L)  HCT 39.0 - 52.0 % 36.5(L) 32.0(L) 33.7(L)  PLT 150 - 400 K/uL 217 - 160  NEUTROABS 1.7 - 7.7 K/uL - - -  LYMPHSABS 0.7 - 4.0 K/uL - - -     There is no height or weight on file to calculate BMI.  Orders:  No orders of the defined types were placed in this encounter.  No orders of the defined types were placed in this encounter.    Procedures: No procedures performed  Clinical Data: No additional findings.  ROS:  All other systems negative, except as noted in the HPI. Review of Systems  Objective: Vital Signs: There were no vitals taken for this visit.  Specialty Comments:  No specialty comments available.  PMFS History: Patient Active Problem List   Diagnosis Date Noted   Haglund's deformity of right heel    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   Status post arthroscopy of left knee 08/12/2019   Complex tear of medial meniscus of left knee 08/04/2019   Chest pain 05/27/2014   Left-sided chest wall pain 01/28/2014   Upper airway cough syndrome 01/28/2014   Arteriosclerosis of coronary artery 09/22/2013   HLD (hyperlipidemia) 09/22/2013   BP (high  blood pressure) 09/22/2013   Upper respiratory infection 07/25/2013   Dehydration 07/25/2013   Carpal tunnel syndrome, right 06/10/2013   H/O malignant neoplasm of skin 03/19/2013   Atypical chest pain 04/13/2012   Slurred speech 04/13/2012   Blurred vision, bilateral 04/13/2012   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   TIA (transient ischemic attack) 11/22/2011   Volume depletion 11/22/2011   Aphasia 11/21/2011   Musculoskeletal chest pain 11/21/2011   Normocytic anemia 11/21/2011   Thrombocytopenia ([place]) 123XX123   Metabolic encephalopathy 123XX123   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 06/05/2011   CAD (coronary artery disease) 06/05/2011   Past Medical History:  Diagnosis Date    Anxiety    Cancer ([place])    skin cancer   Carpal tunnel syndrome    Coronary artery disease    Depression    Hepatitis    Hepatitis - never been treated   Hypercholesteremia    Hypertension    Myocardial infarction [facility]) 2010ish   Pneumonia    PTSD (post-traumatic stress disorder)    PTSD (post-traumatic stress disorder)    Sleep apnea     doesnt use CPAP   Stroke (HCC)    slight weakness left- 85- 90 % returned    Family History  Problem Relation Age of Onset   Heart disease Mother        Several other maternal family members   Lung cancer Father        small cell CA/smoked    Past Surgical History:  Procedure Laterality Date   CARDIAC CATHETERIZATION     CARPAL TUNNEL RELEASE Right 06/10/2013   Procedure: RIGHT CARPAL TUNNEL RELEASE;  Surgeon: Mcarthur Rossetti, MD;  Location: [place];  Service: Orthopedics;  Laterality: Right;   CORONARY ANGIOPLASTY     EXCISION HAGLUND'S DEFORMITY WITH ACHILLES TENDON REPAIR Right 06/24/2021   Procedure: RIGHT HAGLUNDS RESECTION;  Surgeon: Newt Minion, MD;  Location: [place];  Service: Orthopedics;  Laterality: Right;   EYE SURGERY Bilateral    cataract   KNEE ARTHROSCOPY Right    KNEE ARTHROSCOPY Left 08/04/2019   Procedure: LEFT KNEE ARTHROSCOPY WITH PARTIAL MEDIAL MENISCECTOMY;  Surgeon: Mcarthur Rossetti, MD;  Location: [place];  Service: Orthopedics;  Laterality: Left;   NASAL SINUS SURGERY     stents  2012   Social History   Occupational History   Occupation: Retired Nature conservation officer   Tobacco Use   Smoking status: Former    Packs/day: 2.00    Years: 20.00    Pack years: 40.00    Types: Cigarettes    Quit date: 11/21/1987    Years since quitting: 33.7   Smokeless tobacco: Never  Vaping Use   Vaping Use: Never used  Substance and Sexual Activity   Alcohol use: No   Drug use: No   Sexual activity: Not on file

## 2021-09-08 ENCOUNTER — Ambulatory Visit (INDEPENDENT_AMBULATORY_CARE_PROVIDER_SITE_OTHER): Payer: No Typology Code available for payment source | Admitting: Orthopedic Surgery

## 2021-09-08 DIAGNOSIS — M9261 Juvenile osteochondrosis of tarsus, right ankle: Secondary | ICD-10-CM

## 2021-09-11 ENCOUNTER — Encounter: Payer: Self-pay | Admitting: Orthopedic Surgery

## 2021-09-11 NOTE — Progress Notes (Signed)
? ?Office Visit Note ?  ?Patient: [name] [name]           ?Date of Birth: [birth year]           ?MRN: [id number] ?Visit Date: 09/08/2021 ?             ?Requested by: Louanne Skye, MD ?[address] ?[place],  [place] [address] ?PCP: Louanne Skye, MD ? ?Chief Complaint  ?Patient presents with  ? Right Foot - Follow-up  ?  Right haglunds resection  ? ? ? ? ?HPI: ?Patient is a 76 year old gentleman who is 2-1/2 months status post Haglund's resection.  Patient states he has burning and swelling as the day progresses. ? ?Assessment & Plan: ?Visit Diagnoses:  ?1. Haglund's deformity of right heel   ? ? ?Plan: Recommended knee-high compression stockings.  Increase activities as tolerated. ? ?Follow-Up Instructions: Return in about 4 weeks (around 10/06/2021).  ? ?Ortho Exam ? ?Patient is alert, oriented, no adenopathy, well-dressed, normal affect, normal respiratory effort. ?Examination the incision is well-healed the calf is 39 cm in circumference.  There is no redness or cellulitis no signs of infection.  The Achilles is intact. ? ?Imaging: ?No results found. ?No images are attached to the encounter. ? ?Labs: ?Lab Results  ?Component Value Date  ? HGBA1C 5.8 (H) 04/13/2012  ? HGBA1C 5.6 11/21/2011  ? HGBA1C 5.8 (H) 06/06/2011  ? ESRSEDRATE 30 (H) 02/28/2016  ? ESRSEDRATE 19 01/28/2014  ? REPTSTATUS 11/23/2011 FINAL 11/21/2011  ? CULT NO GROWTH 11/21/2011  ? ? ? ?Lab Results  ?Component Value Date  ? ALBUMIN 3.7 07/29/2020  ? ALBUMIN 3.8 06/28/2020  ? ALBUMIN 3.8 02/28/2016  ? ? ?Lab Results  ?Component Value Date  ? MG 2.0 04/12/2012  ? ?No results found for: VD25OH ? ?No results found for: PREALBUMIN ?CBC EXTENDED Latest Ref Rng & Units 06/24/2021 07/29/2020 07/29/2020  ?WBC 4.0 - 10.5 K/uL 5.7 - 6.3  ?RBC 4.22 - 5.81 MIL/uL 4.13(L) - 3.82(L)  ?HGB 13.0 - 17.0 g/dL 12.9(L) 10.9(L) 11.7(L)  ?HCT 39.0 - 52.0 % 36.5(L) 32.0(L) 33.7(L)  ?PLT 150 - 400 K/uL 217 - 160  ?NEUTROABS 1.7 - 7.7 K/uL - - -  ?LYMPHSABS 0.7 - 4.0 K/uL - -  -  ? ? ? ?There is no height or weight on file to calculate BMI. ? ?Orders:  ?No orders of the defined types were placed in this encounter. ? ?No orders of the defined types were placed in this encounter. ? ? ? Procedures: ?No procedures performed ? ?Clinical Data: ?No additional findings. ? ?ROS: ? ?All other systems negative, except as noted in the HPI. ?Review of Systems ? ?Objective: ?Vital Signs: There were no vitals taken for this visit. ? ?Specialty Comments:  ?No specialty comments available. ? ?PMFS History: ?Patient Active Problem List  ? Diagnosis Date Noted  ? Haglund's deformity of right heel   ? 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  ? Status post arthroscopy of left knee 08/12/2019  ? Complex tear of medial meniscus of left knee 08/04/2019  ? Chest pain 05/27/2014  ? Left-sided chest wall pain 01/28/2014  ? Upper airway cough syndrome 01/28/2014  ? Arteriosclerosis of coronary artery 09/22/2013  ? HLD (hyperlipidemia) 09/22/2013  ? BP (high blood pressure) 09/22/2013  ? Upper respiratory infection 07/25/2013  ? Dehydration 07/25/2013  ? Carpal tunnel syndrome, right 06/10/2013  ? H/O malignant neoplasm of skin 03/19/2013  ? Atypical chest pain 04/13/2012  ? Slurred speech 04/13/2012  ? Blurred vision,  bilateral 04/13/2012  ? 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 TIA (transient ischemic attack) 11/22/2011  ? Volume depletion 11/22/2011  ? Aphasia 11/21/2011  ? Musculoskeletal chest pain 11/21/2011  ? Normocytic anemia 11/21/2011  ? Thrombocytopenia (HCC) 11/21/2011  ? Metabolic encephalopathy 11/21/2011  ? 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 CAD (coronary artery disease) 06/05/2011  ? ?Past Medical History:  ?Diagnosis Date  ? Anxiety   ? Cancer [facility])   ? skin cancer  ? Carpal tunnel syndrome   ? Coronary artery disease   ? Depression   ? Hepatitis   ? Hepatitis - never been treated  ? Hypercholesteremia   ? Hypertension   ? Myocardial infarction [facility])  2010ish  ? Pneumonia   ? PTSD (post-traumatic stress disorder)   ? PTSD (post-traumatic stress disorder)   ? Sleep apnea   ?  doesnt use CPAP  ? Stroke [facility])   ? slight weakness left- 85- 90 % returned  ?  ?Family History  ?Problem Relation Age of Onset  ? Heart disease Mother   ?     Several other maternal family members  ? Lung cancer Father   ?     small cell CA/smoked  ?  ?Past Surgical History:  ?Procedure Laterality Date  ? CARDIAC CATHETERIZATION    ? CARPAL TUNNEL RELEASE Right 06/10/2013  ? Procedure: RIGHT CARPAL TUNNEL RELEASE;  Surgeon: Kathryne Hitch, MD;  Location: [facility] OR;  Service: Orthopedics;  Laterality: Right;  ? CORONARY ANGIOPLASTY    ? EXCISION HAGLUND'S DEFORMITY WITH ACHILLES TENDON REPAIR Right 06/24/2021  ? Procedure: RIGHT HAGLUNDS RESECTION;  Surgeon: Nadara Mustard, MD;  Location: [facility] OR;  Service: Orthopedics;  Laterality: Right;  ? EYE SURGERY Bilateral   ? cataract  ? KNEE ARTHROSCOPY Right   ? KNEE ARTHROSCOPY Left 08/04/2019  ? Procedure: LEFT KNEE ARTHROSCOPY WITH PARTIAL MEDIAL MENISCECTOMY;  Surgeon: Kathryne Hitch, MD;  Location: [facility];  Service: Orthopedics;  Laterality: Left;  ? NASAL SINUS SURGERY    ? stents  2012  ? ?Social History  ? ?Occupational History  ? Occupation: Retired Hotel manager   ?Tobacco Use  ? Smoking status: Former  ?  Packs/day: 2.00  ?  Years: 20.00  ?  Pack years: 40.00  ?  Types: Cigarettes  ?  Quit date: 11/21/1987  ?  Years since quitting: 33.8  ? Smokeless tobacco: Never  ?Vaping Use  ? Vaping Use: Never used  ?Substance and Sexual Activity  ? Alcohol use: No  ? Drug use: No  ? Sexual activity: Not on file  ? ? ? ? ? ?

## 2021-10-06 ENCOUNTER — Ambulatory Visit (INDEPENDENT_AMBULATORY_CARE_PROVIDER_SITE_OTHER): Payer: No Typology Code available for payment source | Admitting: Orthopedic Surgery

## 2021-10-06 DIAGNOSIS — M9261 Juvenile osteochondrosis of tarsus, right ankle: Secondary | ICD-10-CM

## 2021-10-16 ENCOUNTER — Encounter: Payer: Self-pay | Admitting: Orthopedic Surgery

## 2021-10-16 NOTE — Progress Notes (Signed)
? ?Office Visit Note ?  ?Patient: [name] [name]           ?Date of Birth: 11/20/1945           ?MRN: [id number] ?Visit Date: 10/06/2021 ?             ?Requested by: Louanne Skye, MD ?[address] ?[place],  [place] [address] ?PCP: Louanne Skye, MD ? ?Chief Complaint  ?Patient presents with  ? Right Foot - Follow-up  ?  06/24/21 right haglunds resection  ? ? ? ? ?HPI: ?Patient is a 76 year old gentleman who is 4 months status post right Haglund's resection.  Patient states that wearing compression socks are painful due to burning sensation. ? ?Patient is status post a stroke involving the left side and has been using Neurontin for the stroke neuropathy.  He is currently taking 100 mg twice a day. ? ?Assessment & Plan: ?Visit Diagnoses:  ?1. Haglund's deformity of right heel   ? ? ?Plan: Recommend that he take 300 mg at night in addition to his current dose. ? ?Follow-Up Instructions: Return in about 1 week (around 10/13/2021).  ? ?Ortho Exam ? ?Patient is alert, oriented, no adenopathy, well-dressed, normal affect, normal respiratory effort. ?Examination there is no redness no cellulitis the wound has healed well.  Patient does have increased sensitivity to light touch.  He has good palpable pulses there is swelling.  He has had no relief with a heel lift.  The sensation is intact on the plantar aspect of his foot there are no dystrophic skin color or temperature changes. ? ?Imaging: ?No results found. ?No images are attached to the encounter. ? ?Labs: ?Lab Results  ?Component Value Date  ? HGBA1C 5.8 (H) 04/13/2012  ? HGBA1C 5.6 11/21/2011  ? HGBA1C 5.8 (H) 06/06/2011  ? ESRSEDRATE 30 (H) 02/28/2016  ? ESRSEDRATE 19 01/28/2014  ? REPTSTATUS 11/23/2011 FINAL 11/21/2011  ? CULT NO GROWTH 11/21/2011  ? ? ? ?Lab Results  ?Component Value Date  ? ALBUMIN 3.7 07/29/2020  ? ALBUMIN 3.8 06/28/2020  ? ALBUMIN 3.8 02/28/2016  ? ? ?Lab Results  ?Component Value Date  ? MG 2.0 04/12/2012  ? ?No results found for: VD25OH ? ?No  results found for: PREALBUMIN ? ?  Latest Ref Rng & Units 06/24/2021  ?  7:00 AM 07/29/2020  ?  3:15 PM 07/29/2020  ?  2:38 PM  ?CBC EXTENDED  ?WBC 4.0 - 10.5 K/uL 5.7    6.3    ?RBC 4.22 - 5.81 MIL/uL 4.13    3.82    ?Hemoglobin 13.0 - 17.0 g/dL 62.8   36.6   29.4    ?HCT 39.0 - 52.0 % 36.5   32.0   33.7    ?Platelets 150 - 400 K/uL 217    160    ? ? ? ?There is no height or weight on file to calculate BMI. ? ?Orders:  ?No orders of the defined types were placed in this encounter. ? ?No orders of the defined types were placed in this encounter. ? ? ? Procedures: ?No procedures performed ? ?Clinical Data: ?No additional findings. ? ?ROS: ? ?All other systems negative, except as noted in the HPI. ?Review of Systems ? ?Objective: ?Vital Signs: There were no vitals taken for this visit. ? ?Specialty Comments:  ?No specialty comments available. ? ?PMFS History: ?Patient Active Problem List  ? Diagnosis Date Noted  ? Haglund's deformity of right heel   ? 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  ?  Status post arthroscopy of left knee 08/12/2019  ? Complex tear of medial meniscus of left knee 08/04/2019  ? Chest pain 05/27/2014  ? Left-sided chest wall pain 01/28/2014  ? Upper airway cough syndrome 01/28/2014  ? Arteriosclerosis of coronary artery 09/22/2013  ? HLD (hyperlipidemia) 09/22/2013  ? BP (high blood pressure) 09/22/2013  ? Upper respiratory infection 07/25/2013  ? Dehydration 07/25/2013  ? Carpal tunnel syndrome, right 06/10/2013  ? H/O malignant neoplasm of skin 03/19/2013  ? Atypical chest pain 04/13/2012  ? Slurred speech 04/13/2012  ? Blurred vision, bilateral 04/13/2012  ? 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 TIA (transient ischemic attack) 11/22/2011  ? Volume depletion 11/22/2011  ? Aphasia 11/21/2011  ? Musculoskeletal chest pain 11/21/2011  ? Normocytic anemia 11/21/2011  ? Thrombocytopenia (HCC) 11/21/2011  ? Metabolic encephalopathy 11/21/2011  ? CVA (cerebral infarction) with history of  left hemiplegia 06/05/2011  ? CAD (coronary artery disease) 06/05/2011  ? ?Past Medical History:  ?Diagnosis Date  ? Anxiety   ? Cancer [facility])   ? skin cancer  ? Carpal tunnel syndrome   ? Coronary artery disease   ? Depression   ? Hepatitis   ? Hepatitis - never been treated  ? Hypercholesteremia   ? Hypertension   ? Myocardial infarction [facility]) 2010ish  ? Pneumonia   ? PTSD (post-traumatic stress disorder)   ? PTSD (post-traumatic stress disorder)   ? Sleep apnea   ?  doesnt use CPAP  ? Stroke [facility])   ? slight weakness left- 85- 90 % returned  ?  ?Family History  ?Problem Relation Age of Onset  ? Heart disease Mother   ?     Several other maternal family members  ? Lung cancer Father   ?     small cell CA/smoked  ?  ?Past Surgical History:  ?Procedure Laterality Date  ? CARDIAC CATHETERIZATION    ? CARPAL TUNNEL RELEASE Right 06/10/2013  ? Procedure: RIGHT CARPAL TUNNEL RELEASE;  Surgeon: Kathryne Hitch, MD;  Location: [facility] OR;  Service: Orthopedics;  Laterality: Right;  ? CORONARY ANGIOPLASTY    ? EXCISION HAGLUND'S DEFORMITY WITH ACHILLES TENDON REPAIR Right 06/24/2021  ? Procedure: RIGHT HAGLUNDS RESECTION;  Surgeon: Nadara Mustard, MD;  Location: [facility] OR;  Service: Orthopedics;  Laterality: Right;  ? EYE SURGERY Bilateral   ? cataract  ? KNEE ARTHROSCOPY Right   ? KNEE ARTHROSCOPY Left 08/04/2019  ? Procedure: LEFT KNEE ARTHROSCOPY WITH PARTIAL MEDIAL MENISCECTOMY;  Surgeon: Kathryne Hitch, MD;  Location: [facility];  Service: Orthopedics;  Laterality: Left;  ? NASAL SINUS SURGERY    ? stents  2012  ? ?Social History  ? ?Occupational History  ? Occupation: Retired Hotel manager   ?Tobacco Use  ? Smoking status: Former  ?  Packs/day: 2.00  ?  Years: 20.00  ?  Pack years: 40.00  ?  Types: Cigarettes  ?  Quit date: 11/21/1987  ?  Years since quitting: 33.9  ? Smokeless tobacco: Never  ?Vaping Use  ? Vaping Use: Never used  ?Substance and Sexual Activity  ? Alcohol use: No  ? Drug use: No  ?  Sexual activity: Not on file  ? ? ? ? ? ?

## 2021-10-18 ENCOUNTER — Ambulatory Visit (INDEPENDENT_AMBULATORY_CARE_PROVIDER_SITE_OTHER): Payer: No Typology Code available for payment source | Admitting: Family

## 2021-10-18 ENCOUNTER — Encounter: Payer: Self-pay | Admitting: Family

## 2021-10-18 DIAGNOSIS — M5416 Radiculopathy, lumbar region: Secondary | ICD-10-CM

## 2021-10-18 DIAGNOSIS — M9261 Juvenile osteochondrosis of tarsus, right ankle: Secondary | ICD-10-CM

## 2021-10-18 MED ORDER — PREDNISONE 50 MG PO TABS: ORAL_TABLET | ORAL | 0 refills | Status: DC

## 2021-10-18 MED ORDER — NITROGLYCERIN 0.2 MG/HR TD PT24: 0.2000 mg | MEDICATED_PATCH | Freq: Every day | TRANSDERMAL | 12 refills | Status: DC

## 2021-10-18 NOTE — Progress Notes (Signed)
? ?Office Visit Note ?  ?Patient: [name] [name]           ?Date of Birth: 04-27-1946           ?MRN: [id number] ?Visit Date: 10/18/2021 ?             ?Requested by: Benito Mccreedy, MD ?[address] ?[place],  [place] [id number] ?PCP: Benito Mccreedy, MD ? ?Chief Complaint  ?Patient presents with  ? Right Achilles Tendon - Follow-up  ?  06/24/21 right Haglund's resection   ? ? ? ? ?HPI: ?The patient is a 76 year old gentleman who presents today in follow-up he is status post right Haglund's resection on December 30 and continues to have significant pain this is causing him to limp he is unable to wear shoe wear that is not open in the back he has pain from rubbing as well as pain in his Achilles.  He continues to have issues with swelling he feels that he is unable to get around and do the activities that he enjoys due to his heel pain ? ?He has had no relief with the heel lift, a cam walker has not yet trialed the nitroglycerin patches ? ?He states that he has gone on to develop other issues of the right side low back pain that radiates into his hip and buttock and down his posterior thigh some radiation down his lower leg this is associated with some heaviness shooting electrical type pain difficulty sleeping due to back pain he does report that he has history of epidural steroid injections for lumbar radiculopathy. ? ?Assessment & Plan: ?Visit Diagnoses: No diagnosis found. ? ?Plan: Recommended he resume using the heel lift as well as begin placing the nitroglycerin patch to his heel daily.  We will place him on a course of prednisone for his lumbar radiculopathy as well as get him back in with [facility] imaging for epidural steroid injection ? ?Follow-Up Instructions: No follow-ups on file.  ? ?Back Exam  ? ?Tenderness  ?The patient is experiencing no tenderness.  ? ?Range of Motion  ?The patient has normal back ROM. ? ?Muscle Strength  ?The patient has normal back strength. ? ?Tests  ?Straight leg raise right:  positive ?Straight leg raise left: negative ? ?Other  ?Gait: antalgic  ? ? ? ? ?Patient is alert, oriented, no adenopathy, well-dressed, normal affect, normal respiratory effort. ?Examination there is no redness no cellulitis the wound has healed well.  Patient does have increased sensitivity to light touch.  He has good palpable pulses there is mild swelling.  The sensation is intact on the plantar aspect of his foot there are no dystrophic skin color or temperature changes.  The Achilles is intact without cord or defect ? ?Imaging: ?No results found. ?No images are attached to the encounter. ? ?Labs: ?Lab Results  ?Component Value Date  ? HGBA1C 5.8 (H) 04/13/2012  ? HGBA1C 5.6 11/21/2011  ? HGBA1C 5.8 (H) 06/06/2011  ? ESRSEDRATE 30 (H) 02/28/2016  ? ESRSEDRATE 19 01/28/2014  ? REPTSTATUS 11/23/2011 FINAL 11/21/2011  ? CULT NO GROWTH 11/21/2011  ? ? ? ?Lab Results  ?Component Value Date  ? ALBUMIN 3.7 07/29/2020  ? ALBUMIN 3.8 06/28/2020  ? ALBUMIN 3.8 02/28/2016  ? ? ?Lab Results  ?Component Value Date  ? MG 2.0 04/12/2012  ? ?No results found for: VD25OH ? ?No results found for: PREALBUMIN ? ?  Latest Ref Rng & Units 06/24/2021  ?  7:00 AM 07/29/2020  ?  3:15  PM 07/29/2020  ?  2:38 PM  ?CBC EXTENDED  ?WBC 4.0 - 10.5 K/uL 5.7    6.3    ?RBC 4.22 - 5.81 MIL/uL 4.13    3.82    ?Hemoglobin 13.0 - 17.0 g/dL 12.9   10.9   11.7    ?HCT 39.0 - 52.0 % 36.5   32.0   33.7    ?Platelets 150 - 400 K/uL 217    160    ? ? ? ?There is no height or weight on file to calculate BMI. ? ?Orders:  ?No orders of the defined types were placed in this encounter. ? ?Meds ordered this encounter  ?Medications  ? nitroGLYCERIN (NITRODUR - DOSED IN MG/24 HR) 0.2 mg/hr patch  ?  Sig: Place 1 patch (0.2 mg total) onto the skin daily.  ?  Dispense:  30 patch  ?  Refill:  12  ? predniSONE (DELTASONE) 50 MG tablet  ?  Sig: Take one tablet by mouth once daily for 5 days.  ?  Dispense:  5 tablet  ?  Refill:  0  ? ? ? Procedures: ?No procedures  performed ? ?Clinical Data: ?No additional findings. ? ?ROS: ? ?All other systems negative, except as noted in the HPI. ?Review of Systems ? ?Objective: ?Vital Signs: There were no vitals taken for this visit. ? ?Specialty Comments:  ?No specialty comments available. ? ?PMFS History: ?Patient Active Problem List  ? Diagnosis Date Noted  ? Haglund's deformity of right heel   ? 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  ? Status post arthroscopy of left knee 08/12/2019  ? Complex tear of medial meniscus of left knee 08/04/2019  ? Chest pain 05/27/2014  ? Left-sided chest wall pain 01/28/2014  ? Upper airway cough syndrome 01/28/2014  ? Arteriosclerosis of coronary artery 09/22/2013  ? HLD (hyperlipidemia) 09/22/2013  ? BP (high blood pressure) 09/22/2013  ? Upper respiratory infection 07/25/2013  ? Dehydration 07/25/2013  ? Carpal tunnel syndrome, right 06/10/2013  ? H/O malignant neoplasm of skin 03/19/2013  ? Atypical chest pain 04/13/2012  ? Slurred speech 04/13/2012  ? Blurred vision, bilateral 04/13/2012  ? 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 TIA (transient ischemic attack) 11/22/2011  ? Volume depletion 11/22/2011  ? Aphasia 11/21/2011  ? Musculoskeletal chest pain 11/21/2011  ? Normocytic anemia 11/21/2011  ? Thrombocytopenia ([place]) 11/21/2011  ? Metabolic encephalopathy [id number]  ? 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 CAD (coronary artery disease) 06/05/2011  ? ?Past Medical History:  ?Diagnosis Date  ? Anxiety   ? Cancer [facility])   ? skin cancer  ? Carpal tunnel syndrome   ? Coronary artery disease   ? Depression   ? Hepatitis   ? Hepatitis - never been treated  ? Hypercholesteremia   ? Hypertension   ? Myocardial infarction [facility]) 2010ish  ? Pneumonia   ? PTSD (post-traumatic stress disorder)   ? PTSD (post-traumatic stress disorder)   ? Sleep apnea   ?  doesnt use CPAP  ? Stroke [facility])   ? slight weakness left- 85- 90 % returned  ?  ?Family History  ?Problem  Relation Age of Onset  ? Heart disease Mother   ?     Several other maternal family members  ? Lung cancer Father   ?     small cell CA/smoked  ?  ?Past Surgical History:  ?Procedure Laterality Date  ? CARDIAC CATHETERIZATION    ? CARPAL TUNNEL RELEASE Right 06/10/2013  ?  Procedure: RIGHT CARPAL TUNNEL RELEASE;  Surgeon: Mcarthur Rossetti, MD;  Location: [place];  Service: Orthopedics;  Laterality: Right;  ? CORONARY ANGIOPLASTY    ? EXCISION HAGLUND'S DEFORMITY WITH ACHILLES TENDON REPAIR Right 06/24/2021  ? Procedure: RIGHT HAGLUNDS RESECTION;  Surgeon: Newt Minion, MD;  Location: [place];  Service: Orthopedics;  Laterality: Right;  ? EYE SURGERY Bilateral   ? cataract  ? KNEE ARTHROSCOPY Right   ? KNEE ARTHROSCOPY Left 08/04/2019  ? Procedure: LEFT KNEE ARTHROSCOPY WITH PARTIAL MEDIAL MENISCECTOMY;  Surgeon: Mcarthur Rossetti, MD;  Location: [place];  Service: Orthopedics;  Laterality: Left;  ? NASAL SINUS SURGERY    ? stents  2012  ? ?Social History  ? ?Occupational History  ? Occupation: Retired Nature conservation officer   ?Tobacco Use  ? Smoking status: Former  ?  Packs/day: 2.00  ?  Years: 20.00  ?  Pack years: 40.00  ?  Types: Cigarettes  ?  Quit date: 11/21/1987  ?  Years since quitting: 33.9  ? Smokeless tobacco: Never  ?Vaping Use  ? Vaping Use: Never used  ?Substance and Sexual Activity  ? Alcohol use: No  ? Drug use: No  ? Sexual activity: Not on file  ? ? ? ? ? ? ?

## 2021-10-20 ENCOUNTER — Ambulatory Visit: Payer: No Typology Code available for payment source | Admitting: Internal Medicine

## 2021-11-02 ENCOUNTER — Other Ambulatory Visit: Payer: Self-pay | Admitting: Family

## 2021-11-02 DIAGNOSIS — M5416 Radiculopathy, lumbar region: Secondary | ICD-10-CM

## 2021-11-12 HISTORY — PX: CARDIAC CATHETERIZATION: SHX172

## 2023-01-03 ENCOUNTER — Encounter: Payer: Self-pay | Admitting: Plastic Surgery

## 2023-01-03 ENCOUNTER — Ambulatory Visit: Payer: No Typology Code available for payment source | Admitting: Plastic Surgery

## 2023-01-03 VITALS — BP 147/92 | HR 79

## 2023-01-03 DIAGNOSIS — Z85828 Personal history of other malignant neoplasm of skin: Secondary | ICD-10-CM | POA: Diagnosis not present

## 2023-01-03 DIAGNOSIS — L989 Disorder of the skin and subcutaneous tissue, unspecified: Secondary | ICD-10-CM | POA: Diagnosis not present

## 2023-01-03 DIAGNOSIS — M7989 Other specified soft tissue disorders: Secondary | ICD-10-CM

## 2023-01-03 NOTE — Progress Notes (Signed)
Patient ID: [name] [name], male    DOB: 26-Sep-1945, 77 y.o.   MRN: [id number]   Chief Complaint  Patient presents with   Consult         Skin Problem    The patient is a 77 year old male here with his wife for evaluation of his forehead.  The patient is a 77 year old male here with his wife for evaluation of his forehead.  The patient states that he has noticed a lump on the superior orbital rim of the right orbit on the medial aspect.  It is accompanied with a sharp pain that comes and goes.  He thinks that the pain and the bump has been together.  He has tried Motrin or Tylenol but he is not sure if its helped or not.  He is not aware of any particular trauma in the area.  The area is very tender to touch and is about 1.5 x 2 cm in size.  He is a retired Hotel manager person.  He has had some heart disease in the past but is not on any blood thinner.  He also complains of skin rash that is somewhat tender on his upper chest and on his face.  He states that he has used everything the doctor has given him including steroids and antibiotics and nothing seems to help.  It is red and a little bit silvery in color.  I put pictures in the chart.  His past medical history is positive for hypertension, hypercholesterolemia, and coronary artery disease, depression, skin cancer and stroke.    Review of Systems  Constitutional: Negative.   HENT: Negative.    Eyes: Negative.   Respiratory: Negative.    Cardiovascular: Negative.   Gastrointestinal: Negative.   Endocrine: Negative.   Genitourinary: Negative.   Musculoskeletal: Negative.     Past Medical History:  Diagnosis Date   Anxiety    Cancer (HCC)    skin cancer   Carpal tunnel syndrome    Coronary artery disease    Depression    Hepatitis    Hepatitis - never been treated   Hypercholesteremia    Hypertension    Myocardial infarction [facility]) 2010ish   Pneumonia    PTSD (post-traumatic stress disorder)    PTSD (post-traumatic stress  disorder)    Sleep apnea     doesnt use CPAP   Stroke (HCC)    slight weakness left- 85- 90 % returned    Past Surgical History:  Procedure Laterality Date   CARDIAC CATHETERIZATION     CARPAL TUNNEL RELEASE Right 06/10/2013   Procedure: RIGHT CARPAL TUNNEL RELEASE;  Surgeon: Kathryne Hitch, MD;  [facility];  Service: Orthopedics;  Laterality: Right;   CORONARY ANGIOPLASTY     EXCISION HAGLUND'S DEFORMITY WITH ACHILLES TENDON REPAIR Right 06/24/2021   Procedure: RIGHT HAGLUNDS RESECTION;  Surgeon: Nadara Mustard, MD;  Location: [facility] OR;  Service: Orthopedics;  Laterality: Right;   EYE SURGERY Bilateral    cataract   KNEE ARTHROSCOPY Right    KNEE ARTHROSCOPY Left 08/04/2019   Procedure: LEFT KNEE ARTHROSCOPY WITH PARTIAL MEDIAL MENISCECTOMY;  Surgeon: Kathryne Hitch, MD;  Location: [facility];  Service: Orthopedics;  Laterality: Left;   NASAL SINUS SURGERY     stents  2012      Current Outpatient Medications:    aspirin EC 81 MG tablet, Take 81 mg by mouth daily., Disp: , Rfl:    atorvastatin (LIPITOR) 80 MG tablet,  Take 80 mg by mouth daily., Disp: , Rfl:    buPROPion (WELLBUTRIN SR) 150 MG 12 hr tablet, Take 150 mg by mouth daily., Disp: , Rfl:    DULoxetine (CYMBALTA) 30 MG capsule, Take 30 mg by mouth daily., Disp: , Rfl:    gabapentin (NEURONTIN) 100 MG capsule, Take 100 mg by mouth 2 (two) times daily., Disp: , Rfl:    lisinopril (PRINIVIL,ZESTRIL) 10 MG tablet, Take 10 mg by mouth daily., Disp: , Rfl:    metoprolol tartrate (LOPRESSOR) 50 MG tablet, Take 25 mg by mouth 2 (two) times daily., Disp: , Rfl:    Multiple Vitamin (MULTIVITAMIN) tablet, Take 1 tablet by mouth daily., Disp: , Rfl:    nitroGLYCERIN (NITRODUR - DOSED IN MG/24 HR) 0.2 mg/hr patch, Place 1 patch (0.2 mg total) onto the skin daily., Disp: 30 patch, Rfl: 12   OVER THE COUNTER MEDICATION, Apply 1 application topically at bedtime. Magnilife DB, Disp: , Rfl:    predniSONE  (DELTASONE) 50 MG tablet, Take one tablet by mouth once daily for 5 days., Disp: 5 tablet, Rfl: 0   pregabalin (LYRICA) 100 MG capsule, Take 100 mg by mouth daily., Disp: , Rfl:    tamsulosin (FLOMAX) 0.4 MG CAPS capsule, Take 0.4 mg by mouth daily., Disp: , Rfl:    Objective:   Vitals:   01/03/23 1408  BP: (!) 147/92  Pulse: 79  SpO2: 96%    Physical Exam Vitals reviewed.  Constitutional:      Appearance: Normal appearance.  Cardiovascular:     Rate and Rhythm: Normal rate.     Pulses: Normal pulses.  Pulmonary:     Effort: Pulmonary effort is normal.  Musculoskeletal:        General: No swelling or deformity.  Skin:    General: Skin is warm.     Capillary Refill: Capillary refill takes less than 2 seconds.  Neurological:     Mental Status: He is alert and oriented to person, place, and time.  Psychiatric:        Mood and Affect: Mood normal.        Behavior: Behavior normal.        Thought Content: Thought content normal.        Judgment: Judgment normal.     Assessment & Plan:  H/O malignant neoplasm of skin  Changing skin lesion  Mass of soft tissue of face  Will plan to do a biopsy of his upper chest and left face area near the hairline.  And then for the lesion on his forehead, he will need an Ultrasound of the forehead to start and then likely an MRI.  Pictures were obtained of the patient and placed in the chart with the patient's or guardian's permission.   Alena Bills Emilie Carp, DO

## 2023-01-09 ENCOUNTER — Ambulatory Visit: Payer: No Typology Code available for payment source | Admitting: Plastic Surgery

## 2023-01-09 ENCOUNTER — Encounter: Payer: Self-pay | Admitting: Plastic Surgery

## 2023-01-09 VITALS — BP 145/81 | HR 82

## 2023-01-09 DIAGNOSIS — M7989 Other specified soft tissue disorders: Secondary | ICD-10-CM

## 2023-01-09 DIAGNOSIS — L820 Inflamed seborrheic keratosis: Secondary | ICD-10-CM

## 2023-01-09 DIAGNOSIS — L989 Disorder of the skin and subcutaneous tissue, unspecified: Secondary | ICD-10-CM

## 2023-01-09 NOTE — Progress Notes (Signed)
Procedure Note  Preoperative Dx: changing skin lesion of chest and face  Postoperative Dx: Same  Procedure: changing skin lesions of chest and face Left upper chest 4 mm Left temple area of face 4 mm  Anesthesia: Lidocaine 1% with 1:100,000 epinephrine  Description of Procedure: Risks and complications were explained to the patient.  Consent was confirmed and the patient understands the risks and benefits.  The potential complications and alternatives were explained and the patient consents.  The patient expressed understanding the option of not having the procedure and the risks of a scar.  Time out was called and all information was confirmed to be correct.    Chest: The area was prepped and drapped.  Lidocaine 1% with epinephrine was injected in the subcutaneous area.  After waiting several minutes for the local to take affect a 4 mm punch biopsy was used to excise a piece of skin for biopsy.  The skin edges were reapproximated with 5-0 Monocryl.    Face: The area was prepped and drapped.  Lidocaine 1% with epinephrine was injected in the subcutaneous area.  After waiting several minutes for the local to take affect a 4 mm punch biopsy was used to excise a piece of skin for biopsy.  The skin edges were reapproximated with 5-0 Monocryl. A dressing was applied.  The patient was given instructions on how to care for the area and a follow up appointment.  [name] [name] tolerated the procedure well and there were no complications. The specimens were sent to pathology.

## 2023-01-10 ENCOUNTER — Other Ambulatory Visit (HOSPITAL_COMMUNITY)
Admission: RE | Admit: 2023-01-10 | Discharge: 2023-01-10 | Disposition: A | Discharge status: Home / Self Care | Payer: No Typology Code available for payment source | Source: Ambulatory Visit | Attending: Plastic Surgery | Admitting: Plastic Surgery

## 2023-01-10 DIAGNOSIS — L989 Disorder of the skin and subcutaneous tissue, unspecified: Secondary | ICD-10-CM | POA: Insufficient documentation

## 2023-01-10 NOTE — Addendum Note (Signed)
Addended by: Peggye Form on: 01/10/2023 08:36 AM   Modules accepted: Orders

## 2023-01-15 LAB — SURGICAL PATHOLOGY

## 2023-01-18 ENCOUNTER — Ambulatory Visit (HOSPITAL_COMMUNITY)
Admission: RE | Admit: 2023-01-18 | Discharge: 2023-01-18 | Disposition: A | Discharge status: Home / Self Care | Payer: No Typology Code available for payment source | Source: Ambulatory Visit | Attending: Plastic Surgery | Admitting: Plastic Surgery

## 2023-01-18 DIAGNOSIS — L989 Disorder of the skin and subcutaneous tissue, unspecified: Secondary | ICD-10-CM | POA: Insufficient documentation

## 2023-01-18 DIAGNOSIS — M7989 Other specified soft tissue disorders: Secondary | ICD-10-CM | POA: Insufficient documentation

## 2023-01-18 DIAGNOSIS — Z85828 Personal history of other malignant neoplasm of skin: Secondary | ICD-10-CM | POA: Insufficient documentation

## 2023-01-24 NOTE — Progress Notes (Signed)
Patient is a pleasant 77 year old male s/p excision of changing skin lesion on chest and face performed 01/09/2023 in office by Dr. Ulice Bold who returns to clinic for postprocedural follow-up.  The excisions were each closed with 5-0 Monocryl sutures.  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  Suggestive of unusual drug reaction versus lichen simplex chronicus.  In his case, could be related to his antihypertensives. Unclear.  Patient evidently was also sent for an ultrasound of forehead given mass appreciated on exam.  Ill-defined 1 x 0.2 cm hypoechoic nodule noted.  Potentially representative of lymph node, though atypical location.  Given indeterminate nature, recommended consideration of maxillofacial CT.  Patient is accompanied by wife at bedside.  He states that his rash affecting the chest and bilateral temporal regions began approximately 4 months ago.  He denies any itching and instead states that he will experience an overwhelming heat and burning sensation.  He states that he was sent here by dermatology and that they have prescribed a litany of creams and ointments without any relief.  As for his forehead mass just over the medial aspect of right orbital rim, he reports that it has been there for a couple of months.  The mass is particular tender to palpation, cannot perform adequate exam.  No overlying erythema or other skin changes.  No fluctuance or crepitus.  As for the excision sites, appear well-healed.  No visible Monocryl sutures.  Significant erythematous rash that blanches over the entire chest as well as underside of chin and temporal regions bilaterally.  No tenderness, swelling, induration, crepitus.  Negative Nikolsky sign.  The temporal region rashes are more consistent with silver patch rather than erythematous macule.    He states that he is no longer on his antihypertensive medications.  Not sure  what to make of his rashes, but he states that they have been going on for several months.  Will defer to dermatology as well as his primary care provider for ongoing evaluation and workup.  As for his excision sites, they appear to be well-healed.  Any residual Monocryl suture will dissolve.  Patient made aware.  As for the mass, I discussed with Dr. Ulice Bold and she would like to move forward with maxillofacial CT for further evaluation to see if he would be amenable to excision.  Recommending telephone follow-up with Dr. Ulice Bold in 4 weeks pending results.  Picture(s) obtained of the patient and placed in the chart were with the patient's or guardian's permission.

## 2023-01-26 ENCOUNTER — Ambulatory Visit (INDEPENDENT_AMBULATORY_CARE_PROVIDER_SITE_OTHER): Payer: No Typology Code available for payment source | Admitting: Physician Assistant

## 2023-01-26 DIAGNOSIS — L820 Inflamed seborrheic keratosis: Secondary | ICD-10-CM

## 2023-01-26 DIAGNOSIS — Z9889 Other specified postprocedural states: Secondary | ICD-10-CM

## 2023-01-29 NOTE — Addendum Note (Signed)
Addended by: Evelena Leyden on: 01/29/2023 10:18 AM   Modules accepted: Orders

## 2023-02-16 ENCOUNTER — Ambulatory Visit
Admission: RE | Admit: 2023-02-16 | Discharge: 2023-02-16 | Disposition: A | Discharge status: Home / Self Care | Payer: No Typology Code available for payment source | Source: Ambulatory Visit | Attending: Physician Assistant

## 2023-02-16 DIAGNOSIS — L820 Inflamed seborrheic keratosis: Secondary | ICD-10-CM

## 2023-02-16 MED ORDER — IOPAMIDOL (ISOVUE-300) INJECTION 61%
100.0000 mL | Freq: Once | INTRAVENOUS | Status: AC | PRN
  Administered 2023-02-16: 75 mL via INTRAVENOUS

## 2023-02-23 ENCOUNTER — Telehealth (INDEPENDENT_AMBULATORY_CARE_PROVIDER_SITE_OTHER): Payer: No Typology Code available for payment source | Admitting: Plastic Surgery

## 2023-02-23 DIAGNOSIS — Z719 Counseling, unspecified: Secondary | ICD-10-CM

## 2023-02-23 NOTE — Telephone Encounter (Signed)
Patient was sent for a CT scan about 2 weeks ago. He says he has not heard anything back regarding the scan and has questions babout it. Patient is requesting a call back.

## 2023-02-27 NOTE — Telephone Encounter (Signed)
Hi Anaya,  It looks like he had his imaging completed 02/16/2023 and it has not yet been read by a radiologist.  Once we have a formal read, we will call him to discuss the results.  If we still do not have a radiology interpretation by the end of the week I will try to call and ensure that it has not fallen through the cracks.    Please let the patient know. Thank you

## 2023-02-28 ENCOUNTER — Ambulatory Visit (INDEPENDENT_AMBULATORY_CARE_PROVIDER_SITE_OTHER): Payer: Self-pay | Admitting: Plastic Surgery

## 2023-02-28 DIAGNOSIS — M7989 Other specified soft tissue disorders: Secondary | ICD-10-CM

## 2023-02-28 NOTE — Progress Notes (Signed)
Left message with pt regarding ENT consult.

## 2023-02-28 NOTE — Telephone Encounter (Signed)
I have reviewed the CT scan.  I have also talked to Dr. Daine Gip in ENT who is willing to see the patient.  I left a message letting the patient know and we will make a referral.

## 2023-02-28 NOTE — Addendum Note (Signed)
Addended by: Peggye Form on: 02/28/2023 11:17 AM   Modules accepted: Orders

## 2023-03-12 NOTE — Progress Notes (Signed)
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 It looks like you have already discussed case with Dr. Ernestene Kiel ENT. They called him last week to schedule him an appointment for evaluation of the mass.

## 2023-10-03 ENCOUNTER — Other Ambulatory Visit: Payer: Self-pay

## 2023-10-03 DIAGNOSIS — I739 Peripheral vascular disease, unspecified: Secondary | ICD-10-CM

## 2023-10-04 ENCOUNTER — Encounter: Payer: Self-pay | Admitting: Surgery

## 2023-10-04 ENCOUNTER — Ambulatory Visit (INDEPENDENT_AMBULATORY_CARE_PROVIDER_SITE_OTHER): Admitting: Surgery

## 2023-10-04 ENCOUNTER — Ambulatory Visit (HOSPITAL_COMMUNITY)
Admission: RE | Admit: 2023-10-04 | Discharge: 2023-10-04 | Disposition: A | Discharge status: Home / Self Care | Source: Ambulatory Visit | Attending: Vascular Surgery | Admitting: Vascular Surgery

## 2023-10-04 VITALS — BP 161/91 | HR 67 | Temp 98.4°F | Resp 20 | Ht 70.0 in | Wt 197.0 lb

## 2023-10-04 DIAGNOSIS — G5792 Unspecified mononeuropathy of left lower limb: Secondary | ICD-10-CM | POA: Diagnosis not present

## 2023-10-04 DIAGNOSIS — I739 Peripheral vascular disease, unspecified: Secondary | ICD-10-CM | POA: Diagnosis present

## 2023-10-04 DIAGNOSIS — M7989 Other specified soft tissue disorders: Secondary | ICD-10-CM

## 2023-10-04 LAB — VAS US ABI WITH/WO TBI
Left ABI: 1.28
Right ABI: 1.24

## 2023-10-04 NOTE — Progress Notes (Signed)
 [facility] [place]  Patient name: [name] [name] MRN: [id number] DOB: 11-20-1945 Sex: male   REQUESTING PROVIDER:    Dr. Allena Katz    REASON FOR CONSULT:    Leg pain  HISTORY OF PRESENT ILLNESS:   [name] [name] is a 78 y.o. male, who is referred for evaluation of left leg pain.  He states that he has been dealing with this since 2019 where it began as blisters on the bottom of his foot.  Now he has pain up to his mid calf that affects him at night.  The only thing that helps is soaking his foot in ice water.  He has according to him undergone every test and treatment possible without improvement.  He has tried multiple medications without improvement.  He is at the point now where he is thinking about taking his leg off himself.  He is here today to see me for amputation  PAST MEDICAL HISTORY    Past Medical History:  Diagnosis Date   Anxiety    Cancer (HCC)    skin cancer   Carpal tunnel syndrome    Coronary artery disease    Depression    Hepatitis    Hepatitis - never been treated   Hypercholesteremia    Hypertension    Myocardial infarction (HCC) 2010ish   Pneumonia    PTSD (post-traumatic stress disorder)    PTSD (post-traumatic stress disorder)    Sleep apnea     doesnt use CPAP   Stroke (HCC)    slight weakness left- 85- 90 % returned     FAMILY HISTORY   Family History  Problem Relation Age of Onset   Heart disease Mother        Several other maternal family members   Lung cancer Father        small cell CA/smoked    SOCIAL HISTORY:   Social History   Socioeconomic History   Marital status: Married    Spouse name: Not on file   Number of children: Not on file   Years of education: Not on file   Highest education level: Not on file  Occupational History   Occupation: Retired Hotel manager   Tobacco Use   Smoking status: Former    Current packs/day: 0.00    Average packs/day: 2.0 packs/day  for 20.0 years (40.0 ttl pk-yrs)    Types: Cigarettes    Start date: 11/21/1967    Quit date: 11/21/1987    Years since quitting: 35.8   Smokeless tobacco: Never  Vaping Use   Vaping status: Never Used  Substance and Sexual Activity   Alcohol use: No   Drug use: No   Sexual activity: Not on file  Other Topics Concern   Not on file  Social History Narrative   Not on file   Social Drivers of Health   Financial Resource Strain: Low Risk  (08/25/2022)   Received from [facility], [facility]   Overall Financial Resource Strain (CARDIA)    Difficulty of Paying Living Expenses: Not very hard  Food Insecurity: Low Risk  (04/02/2023)   Received from [facility]   Hunger Vital Sign    Worried About Running Out of Food in the Last Year: Never true    Ran Out of Food in the Last Year: Never true  Transportation Needs: No Transportation Needs (04/02/2023)   Received from [facility]    In the past 12 months, has lack of  reliable transportation kept you from medical appointments, meetings, work or from getting things needed for daily living? : No  Physical Activity: Not on file  Stress: No Stress Concern Present (08/25/2022)   Received from [facility], [facility] of Occupational [facility] - Occupational Stress Questionnaire    Feeling of Stress : Not at all  Social Connections: Unknown (03/07/2022)   Received from [facility], [facility]   Social Network    Social Network: Not on file  Intimate Partner Violence: Unknown (03/07/2022)   Received from [facility], [facility]   HITS    Physically Hurt: Not on file    Insult or Talk Down To: Not on file    Threaten Physical Harm: Not on file    Scream or Curse: Not on file    ALLERGIES:    Allergies  Allergen Reactions   Dilaudid [Hydromorphone Hcl] Other (See Comments)    "CRASHES" per patient Talked to nurse and she said pt is afraid to take dilaudid because pt states he  flat-lined when they gave it to him.Marland KitchenMarland KitchenPt doesn't mind taking Percocet    Clindamycin/Lincomycin Other (See Comments)    Blisters in mouth    CURRENT MEDICATIONS:    Current Outpatient Medications  Medication Sig Dispense Refill   alendronate (FOSAMAX) 10 MG tablet Take by mouth.     aspirin EC 81 MG tablet Take 81 mg by mouth daily.     atorvastatin (LIPITOR) 80 MG tablet Take 80 mg by mouth daily.     buPROPion (WELLBUTRIN SR) 150 MG 12 hr tablet Take 150 mg by mouth daily.     carbamazepine (TEGRETOL) 200 MG tablet Take by mouth.     DULoxetine (CYMBALTA) 30 MG capsule Take 30 mg by mouth daily.     gabapentin (NEURONTIN) 100 MG capsule Take 100 mg by mouth 2 (two) times daily.     lisinopril (PRINIVIL,ZESTRIL) 10 MG tablet Take 10 mg by mouth daily.     metoprolol tartrate (LOPRESSOR) 50 MG tablet Take 25 mg by mouth 2 (two) times daily.     Multiple Vitamin (MULTIVITAMIN) tablet Take 1 tablet by mouth daily.     nitroGLYCERIN (NITRODUR - DOSED IN MG/24 HR) 0.2 mg/hr patch Place 1 patch (0.2 mg total) onto the skin daily. 30 patch 12   OVER THE COUNTER MEDICATION Apply 1 application  topically at bedtime. Magnilife DB     predniSONE (DELTASONE) 50 MG tablet Take one tablet by mouth once daily for 5 days. 5 tablet 0   pregabalin (LYRICA) 100 MG capsule Take 100 mg by mouth daily.     tamsulosin (FLOMAX) 0.4 MG CAPS capsule Take 0.4 mg by mouth daily.     No current facility-administered medications for this visit.    REVIEW OF SYSTEMS:   [X]  denotes positive finding, [ ]  denotes negative finding Cardiac  Comments:  Chest pain or chest pressure:    Shortness of breath upon exertion:    Short of breath when lying flat:    Irregular heart rhythm:        Vascular    Pain in calf, thigh, or hip brought on by ambulation:    Pain in feet at night that wakes you up from your sleep:     Blood clot in your veins:    Leg swelling:         Pulmonary    Oxygen at home:     Productive cough:  Wheezing:         Neurologic    Sudden weakness in arms or legs:     Sudden numbness in arms or legs:     Sudden onset of difficulty speaking or slurred speech:    Temporary loss of vision in one eye:     Problems with dizziness:         Gastrointestinal    Blood in stool:      Vomited blood:         Genitourinary    Burning when urinating:     Blood in urine:        Psychiatric    Major depression:         Hematologic    Bleeding problems:    Problems with blood clotting too easily:        Skin    Rashes or ulcers:        Constitutional    Fever or chills:     PHYSICAL EXAM:   Vitals:   10/04/23 0951  BP: (!) 161/91  Pulse: 67  Resp: 20  Temp: 98.4 F (36.9 C)  SpO2: 97%  Weight: 197 lb (89.4 kg)  Height: 5\' 10"  (1.778 m)    GENERAL: The patient is a well-nourished male, in no acute distress. The vital signs are documented above. CARDIAC: There is a regular rate and rhythm.  VASCULAR: Palpable pedal pulses PULMONARY: Nonlabored respirations ABDOMEN: Soft and non-tender with normal pitched bowel sounds.  MUSCULOSKELETAL: There are no major deformities or cyanosis. NEUROLOGIC: No focal weakness or paresthesias are detected. SKIN: There are no ulcers or rashes noted. PSYCHIATRIC: The patient has a normal affect.  STUDIES:   I have reviewed the following:  ------+  ABI/TBIToday's ABIToday's TBIPrevious ABIPrevious TBI  +-------+-----------+-----------+------------+------------+  Right 1.24       0.86                                 +-------+-----------+-----------+------------+------------+  Left  1.28       0.65                                 +-------+-----------+-----------+------------+------------+  Right toe pressure: 130 Left toe pressure: 98 Waveforms are triphasic  ASSESSMENT and PLAN   Left leg pain: The patient does not have a vascular etiology for his leg pain.  He has tried and failed all  options.  He is strongly desiring a amputation for pain control.  I discussed that with amputation this will have a significant impact on his overall quality of life.  In addition I am not sure that a below-knee amputation will completely alleviate his symptoms as his line of demarcation for his pain is in the upper lower leg.  He may require an above-knee amputation.  Before proceeding with such a drastic measure, I would like for him to get a second opinion.  I am sending him to a academic center neurology for expedited evaluation.  I am also going to put him in a compression sock to see if this has any impact on his pain.  I will see him back in a few weeks at which point in time we will consider whether or not to proceed with amputation   Durene Cal, IV, MD, FACS [facility] [phone] Pager [phone]

## 2023-10-22 ENCOUNTER — Encounter: Payer: Self-pay | Admitting: Surgery

## 2023-10-22 ENCOUNTER — Ambulatory Visit: Attending: Surgery | Admitting: Surgery

## 2023-10-22 ENCOUNTER — Other Ambulatory Visit: Payer: Self-pay

## 2023-10-22 VITALS — BP 131/81 | HR 77 | Temp 98.3°F | Resp 20 | Ht 70.0 in | Wt 201.0 lb

## 2023-10-22 DIAGNOSIS — M79662 Pain in left lower leg: Secondary | ICD-10-CM

## 2023-10-22 DIAGNOSIS — G5792 Unspecified mononeuropathy of left lower limb: Secondary | ICD-10-CM

## 2023-10-22 DIAGNOSIS — M79605 Pain in left leg: Secondary | ICD-10-CM

## 2023-10-22 NOTE — Progress Notes (Signed)
 [facility] [place]  Patient name: [name] [name] MRN: [id number] DOB: 1946-04-11 Sex: male   REASON FOR VISIT:    Follow up  HISOTRY OF PRESENT ILLNESS:    [name] [name] is a 78 y.o. male who I saw in April 2025 for evaluation of left leg pain that he has been dealing with since 2019.  It began as blisters on the bottom of his foot now the pain goes up to his mid calf and affects him at night.  The only thing that helps him is soaking the foot in ice water.  He has undergone multiple treatments and medication trials without improvement.  He is at the point of considering leg amputation.  I sent him for a second opinion at an academic neurologist   PAST MEDICAL HISTORY:   Past Medical History:  Diagnosis Date   Anxiety    Cancer (HCC)    skin cancer   Carpal tunnel syndrome    Coronary artery disease    Depression    Hepatitis    Hepatitis - never been treated   Hypercholesteremia    Hypertension    Myocardial infarction [facility]) 2010ish   Pneumonia    PTSD (post-traumatic stress disorder)    PTSD (post-traumatic stress disorder)    Sleep apnea     doesnt use CPAP   Stroke (HCC)    slight weakness left- 85- 90 % returned     FAMILY HISTORY:   Family History  Problem Relation Age of Onset   Heart disease Mother        Several other maternal family members   Lung cancer Father        small cell CA/smoked    SOCIAL HISTORY:   Social History   Tobacco Use   Smoking status: Former    Current packs/day: 0.00    Average packs/day: 2.0 packs/day for 20.0 years (40.0 ttl pk-yrs)    Types: Cigarettes    Start date: 11/21/1967    Quit date: 11/21/1987    Years since quitting: 35.9   Smokeless tobacco: Never  Substance Use Topics   Alcohol use: No     ALLERGIES:   Allergies  Allergen Reactions   Dilaudid [Hydromorphone Hcl] Other (See Comments)    "CRASHES" per patient Talked to nurse and she said  pt is afraid to take dilaudid because pt states he flat-lined when they gave it to him.Aaron AasAaron AasPt doesn't mind taking Percocet    Clindamycin/Lincomycin Other (See Comments)    Blisters in mouth     CURRENT MEDICATIONS:   Current Outpatient Medications  Medication Sig Dispense Refill   alendronate (FOSAMAX) 10 MG tablet Take by mouth.     aspirin  EC 81 MG tablet Take 81 mg by mouth daily.     atorvastatin (LIPITOR) 80 MG tablet Take 80 mg by mouth daily.     buPROPion  (WELLBUTRIN  SR) 150 MG 12 hr tablet Take 150 mg by mouth daily.     carbamazepine (TEGRETOL) 200 MG tablet Take by mouth.     DULoxetine (CYMBALTA) 30 MG capsule Take 30 mg by mouth daily.     gabapentin  (NEURONTIN ) 100 MG capsule Take 100 mg by mouth 2 (two) times daily.     lisinopril  (PRINIVIL ,ZESTRIL ) 10 MG tablet Take 10 mg by mouth daily.     metoprolol  tartrate (LOPRESSOR ) 50 MG tablet Take 25 mg by mouth 2 (two) times daily.     Multiple Vitamin (MULTIVITAMIN) tablet Take 1 tablet  by mouth daily.     nitroGLYCERIN  (NITRODUR - DOSED IN MG/24 HR) 0.2 mg/hr patch Place 1 patch (0.2 mg total) onto the skin daily. 30 patch 12   OVER THE COUNTER MEDICATION Apply 1 application  topically at bedtime. Magnilife DB     predniSONE  (DELTASONE ) 50 MG tablet Take one tablet by mouth once daily for 5 days. 5 tablet 0   pregabalin (LYRICA) 100 MG capsule Take 100 mg by mouth daily.     tamsulosin (FLOMAX) 0.4 MG CAPS capsule Take 0.4 mg by mouth daily.     No current facility-administered medications for this visit.    REVIEW OF SYSTEMS:   [X]  denotes positive finding, [ ]  denotes negative finding Cardiac  Comments:  Chest pain or chest pressure:    Shortness of breath upon exertion:    Short of breath when lying flat:    Irregular heart rhythm:        Vascular    Pain in calf, thigh, or hip brought on by ambulation:    Pain in feet at night that wakes you up from your sleep:     Blood clot in your veins:    Leg swelling:          Pulmonary    Oxygen at home:    Productive cough:     Wheezing:         Neurologic    Sudden weakness in arms or legs:     Sudden numbness in arms or legs:     Sudden onset of difficulty speaking or slurred speech:    Temporary loss of vision in one eye:     Problems with dizziness:         Gastrointestinal    Blood in stool:     Vomited blood:         Genitourinary    Burning when urinating:     Blood in urine:        Psychiatric    Major depression:         Hematologic    Bleeding problems:    Problems with blood clotting too easily:        Skin    Rashes or ulcers:        Constitutional    Fever or chills:      PHYSICAL EXAM:   Vitals:   10/22/23 1102  BP: 131/81  Pulse: 77  Resp: 20  Temp: 98.3 F (36.8 C)  SpO2: 93%  Weight: 201 lb (91.2 kg)  Height: 5\' 10"  (1.778 m)    GENERAL: The patient is a well-nourished male, in no acute distress. The vital signs are documented above. CARDIAC: There is a regular rate and rhythm.  VASCULAR: Palpable left dorsalis pedis pulse PULMONARY: Non-labored respirations ABDOMEN: Soft and non-tender with normal pitched bowel sounds.  MUSCULOSKELETAL: There are no major deformities or cyanosis. NEUROLOGIC: No focal weakness or paresthesias are detected. SKIN: There are no ulcers or rashes noted. PSYCHIATRIC: The patient has a normal affect.  STUDIES:   none  MEDICAL ISSUES:   Left leg pain: The patient has gotten a second opinion from neurology.  He continues to be adamant that he wants to proceed with amputation.  He did mark the line of demarcation and I think he can have a below-knee amputation although I did discuss that this could lead to an above-knee amputation.  He is well-educated in what is going on and this is his decision which he is  fully competent to make.  He wants to get this done next week    [name], IV, MD, FACS [facility] [phone] Pager  [phone]

## 2023-10-22 NOTE — H&P (View-Only) (Signed)
 [facility] [place]  Patient name: [name] [name] MRN: [id number] DOB: 1946-04-11 Sex: male   REASON FOR VISIT:    Follow up  HISOTRY OF PRESENT ILLNESS:    [name] [name] is a 78 y.o. male who I saw in April 2025 for evaluation of left leg pain that he has been dealing with since 2019.  It began as blisters on the bottom of his foot now the pain goes up to his mid calf and affects him at night.  The only thing that helps him is soaking the foot in ice water.  He has undergone multiple treatments and medication trials without improvement.  He is at the point of considering leg amputation.  I sent him for a second opinion at an academic neurologist   PAST MEDICAL HISTORY:   Past Medical History:  Diagnosis Date   Anxiety    Cancer (HCC)    skin cancer   Carpal tunnel syndrome    Coronary artery disease    Depression    Hepatitis    Hepatitis - never been treated   Hypercholesteremia    Hypertension    Myocardial infarction [facility]) 2010ish   Pneumonia    PTSD (post-traumatic stress disorder)    PTSD (post-traumatic stress disorder)    Sleep apnea     doesnt use CPAP   Stroke (HCC)    slight weakness left- 85- 90 % returned     FAMILY HISTORY:   Family History  Problem Relation Age of Onset   Heart disease Mother        Several other maternal family members   Lung cancer Father        small cell CA/smoked    SOCIAL HISTORY:   Social History   Tobacco Use   Smoking status: Former    Current packs/day: 0.00    Average packs/day: 2.0 packs/day for 20.0 years (40.0 ttl pk-yrs)    Types: Cigarettes    Start date: 11/21/1967    Quit date: 11/21/1987    Years since quitting: 35.9   Smokeless tobacco: Never  Substance Use Topics   Alcohol use: No     ALLERGIES:   Allergies  Allergen Reactions   Dilaudid [Hydromorphone Hcl] Other (See Comments)    "CRASHES" per patient Talked to nurse and she said  pt is afraid to take dilaudid because pt states he flat-lined when they gave it to him.Aaron AasAaron AasPt doesn't mind taking Percocet    Clindamycin/Lincomycin Other (See Comments)    Blisters in mouth     CURRENT MEDICATIONS:   Current Outpatient Medications  Medication Sig Dispense Refill   alendronate (FOSAMAX) 10 MG tablet Take by mouth.     aspirin  EC 81 MG tablet Take 81 mg by mouth daily.     atorvastatin (LIPITOR) 80 MG tablet Take 80 mg by mouth daily.     buPROPion  (WELLBUTRIN  SR) 150 MG 12 hr tablet Take 150 mg by mouth daily.     carbamazepine (TEGRETOL) 200 MG tablet Take by mouth.     DULoxetine (CYMBALTA) 30 MG capsule Take 30 mg by mouth daily.     gabapentin  (NEURONTIN ) 100 MG capsule Take 100 mg by mouth 2 (two) times daily.     lisinopril  (PRINIVIL ,ZESTRIL ) 10 MG tablet Take 10 mg by mouth daily.     metoprolol  tartrate (LOPRESSOR ) 50 MG tablet Take 25 mg by mouth 2 (two) times daily.     Multiple Vitamin (MULTIVITAMIN) tablet Take 1 tablet  by mouth daily.     nitroGLYCERIN  (NITRODUR - DOSED IN MG/24 HR) 0.2 mg/hr patch Place 1 patch (0.2 mg total) onto the skin daily. 30 patch 12   OVER THE COUNTER MEDICATION Apply 1 application  topically at bedtime. Magnilife DB     predniSONE  (DELTASONE ) 50 MG tablet Take one tablet by mouth once daily for 5 days. 5 tablet 0   pregabalin (LYRICA) 100 MG capsule Take 100 mg by mouth daily.     tamsulosin (FLOMAX) 0.4 MG CAPS capsule Take 0.4 mg by mouth daily.     No current facility-administered medications for this visit.    REVIEW OF SYSTEMS:   [X]  denotes positive finding, [ ]  denotes negative finding Cardiac  Comments:  Chest pain or chest pressure:    Shortness of breath upon exertion:    Short of breath when lying flat:    Irregular heart rhythm:        Vascular    Pain in calf, thigh, or hip brought on by ambulation:    Pain in feet at night that wakes you up from your sleep:     Blood clot in your veins:    Leg swelling:          Pulmonary    Oxygen at home:    Productive cough:     Wheezing:         Neurologic    Sudden weakness in arms or legs:     Sudden numbness in arms or legs:     Sudden onset of difficulty speaking or slurred speech:    Temporary loss of vision in one eye:     Problems with dizziness:         Gastrointestinal    Blood in stool:     Vomited blood:         Genitourinary    Burning when urinating:     Blood in urine:        Psychiatric    Major depression:         Hematologic    Bleeding problems:    Problems with blood clotting too easily:        Skin    Rashes or ulcers:        Constitutional    Fever or chills:      PHYSICAL EXAM:   Vitals:   10/22/23 1102  BP: 131/81  Pulse: 77  Resp: 20  Temp: 98.3 F (36.8 C)  SpO2: 93%  Weight: 201 lb (91.2 kg)  Height: 5\' 10"  (1.778 m)    GENERAL: The patient is a well-nourished male, in no acute distress. The vital signs are documented above. CARDIAC: There is a regular rate and rhythm.  VASCULAR: Palpable left dorsalis pedis pulse PULMONARY: Non-labored respirations ABDOMEN: Soft and non-tender with normal pitched bowel sounds.  MUSCULOSKELETAL: There are no major deformities or cyanosis. NEUROLOGIC: No focal weakness or paresthesias are detected. SKIN: There are no ulcers or rashes noted. PSYCHIATRIC: The patient has a normal affect.  STUDIES:   none  MEDICAL ISSUES:   Left leg pain: The patient has gotten a second opinion from neurology.  He continues to be adamant that he wants to proceed with amputation.  He did mark the line of demarcation and I think he can have a below-knee amputation although I did discuss that this could lead to an above-knee amputation.  He is well-educated in what is going on and this is his decision which he is  fully competent to make.  He wants to get this done next week    [name], IV, MD, FACS [facility] [phone] Pager  [phone]

## 2023-11-12 ENCOUNTER — Telehealth: Payer: Self-pay

## 2023-11-12 ENCOUNTER — Ambulatory Visit: Admitting: Surgery

## 2023-11-12 ENCOUNTER — Other Ambulatory Visit (HOSPITAL_COMMUNITY)

## 2023-11-12 NOTE — Pre-Procedure Instructions (Signed)
 Surgical Instructions   Your procedure is scheduled on Nov 21, 2023. Report to [facility] Main Entrance "A" at 5:30 A.M., then check in with the Admitting office. Any questions or running late day of surgery: call [phone]  Questions prior to your surgery date: call [phone], Monday-Friday, 8am-4pm. If you experience any cold or flu symptoms such as cough, fever, chills, shortness of breath, etc. between now and your scheduled surgery, please notify us  at the above number.     Remember:  Do not eat or drink after midnight the night before your surgery    Take these medicines the morning of surgery with A SIP OF WATER: aspirin  EC  atorvastatin (LIPITOR)  buPROPion  (WELLBUTRIN  XL)  DULoxetine (CYMBALTA)  omeprazole (PRILOSEC)  tamsulosin (FLOMAX)    May take these medicines IF NEEDED: nitroGLYCERIN  - if dose taken prior to surgery, please call either of the above phone numbers   One week prior to surgery, STOP taking any Aleve, Naproxen, Ibuprofen , Motrin , Advil , Goody's, BC's, all herbal medications, fish oil, and non-prescription vitamins.                     Do NOT Smoke (Tobacco/Vaping) for 24 hours prior to your procedure.  If you use a CPAP at night, you may bring your mask/headgear for your overnight stay.   You will be asked to remove any contacts, glasses, piercing's, hearing aid's, dentures/partials prior to surgery. Please bring cases for these items if needed.    Patients discharged the day of surgery will not be allowed to drive home, and someone needs to stay with them for 24 hours.  SURGICAL WAITING ROOM VISITATION Patients may have no more than 2 support people in the waiting area - these visitors may rotate.   Pre-op nurse will coordinate an appropriate time for 1 ADULT support person, who may not rotate, to accompany patient in pre-op.  Children under the age of 62 must have an adult with them who is not the patient and must remain in the main waiting  area with an adult.  If the patient needs to stay at the hospital during part of their recovery, the visitor guidelines for inpatient rooms apply.  Please refer to the [facility] website for the visitor guidelines for any additional information.   If you received a COVID test during your pre-op visit  it is requested that you wear a mask when out in public, stay away from anyone that may not be feeling well and notify your surgeon if you develop symptoms. If you have been in contact with anyone that has tested positive in the last 10 days please notify you surgeon.      Pre-operative CHG Bathing Instructions   You can play a key role in reducing the risk of infection after surgery. Your skin needs to be as free of germs as possible. You can reduce the number of germs on your skin by washing with CHG (chlorhexidine  gluconate) soap before surgery. CHG is an antiseptic soap that kills germs and continues to kill germs even after washing.   DO NOT use if you have an allergy to chlorhexidine /CHG or antibacterial soaps. If your skin becomes reddened or irritated, stop using the CHG and notify one of our RNs at [phone].              TAKE A SHOWER THE NIGHT BEFORE SURGERY AND THE DAY OF SURGERY    Please keep in mind the following:  DO  NOT shave, including legs and underarms, 48 hours prior to surgery.   You may shave your face before/day of surgery.  Place clean sheets on your bed the night before surgery Use a clean washcloth (not used since being washed) for each shower. DO NOT sleep with pet's night before surgery.  CHG Shower Instructions:  Wash your face and private area with normal soap. If you choose to wash your hair, wash first with your normal shampoo.  After you use shampoo/soap, rinse your hair and body thoroughly to remove shampoo/soap residue.  Turn the water OFF and apply half the bottle of CHG soap to a CLEAN washcloth.  Apply CHG soap ONLY FROM YOUR NECK DOWN TO YOUR  TOES (washing for 3-5 minutes)  DO NOT use CHG soap on face, private areas, open wounds, or sores.  Pay special attention to the area where your surgery is being performed.  If you are having back surgery, having someone wash your back for you may be helpful. Wait 2 minutes after CHG soap is applied, then you may rinse off the CHG soap.  Pat dry with a clean towel  Put on clean pajamas    Additional instructions for the day of surgery: DO NOT APPLY any lotions, deodorants, cologne, or perfumes.   Do not wear jewelry or makeup Do not wear nail polish, gel polish, artificial nails, or any other type of covering on natural nails (fingers and toes) Do not bring valuables to the hospital. [facility] is not responsible for valuables/personal belongings. Put on clean/comfortable clothes.  Please brush your teeth.  Ask your nurse before applying any prescription medications to the skin.

## 2023-11-12 NOTE — Telephone Encounter (Signed)
 Cardiac clearance request submitted to Dr. Latrelle Pole ([facility]). Cardiac clearance appt requested by [facility] pre-op services.

## 2023-11-13 ENCOUNTER — Encounter (HOSPITAL_COMMUNITY)
Admission: RE | Admit: 2023-11-13 | Discharge: 2023-11-13 | Disposition: A | Discharge status: Home / Self Care | Source: Ambulatory Visit | Attending: Surgery | Admitting: Surgery

## 2023-11-13 ENCOUNTER — Encounter (HOSPITAL_COMMUNITY): Payer: Self-pay

## 2023-11-13 ENCOUNTER — Other Ambulatory Visit: Payer: Self-pay

## 2023-11-13 ENCOUNTER — Telehealth: Payer: Self-pay

## 2023-11-13 VITALS — BP 152/86 | HR 64 | Temp 97.9°F | Resp 17 | Ht 69.0 in | Wt 200.4 lb

## 2023-11-13 DIAGNOSIS — I2510 Atherosclerotic heart disease of native coronary artery without angina pectoris: Secondary | ICD-10-CM | POA: Diagnosis not present

## 2023-11-13 DIAGNOSIS — M79605 Pain in left leg: Secondary | ICD-10-CM | POA: Insufficient documentation

## 2023-11-13 DIAGNOSIS — G5792 Unspecified mononeuropathy of left lower limb: Secondary | ICD-10-CM | POA: Diagnosis not present

## 2023-11-13 DIAGNOSIS — Z87891 Personal history of nicotine dependence: Secondary | ICD-10-CM | POA: Insufficient documentation

## 2023-11-13 DIAGNOSIS — Z8673 Personal history of transient ischemic attack (TIA), and cerebral infarction without residual deficits: Secondary | ICD-10-CM | POA: Insufficient documentation

## 2023-11-13 DIAGNOSIS — Z01818 Encounter for other preprocedural examination: Secondary | ICD-10-CM | POA: Insufficient documentation

## 2023-11-13 DIAGNOSIS — M79662 Pain in left lower leg: Secondary | ICD-10-CM

## 2023-11-13 HISTORY — DX: Peripheral vascular disease, unspecified: I73.9

## 2023-11-13 HISTORY — DX: Mononeuropathy, unspecified: G58.9

## 2023-11-13 LAB — PROTIME-INR
INR: 1.1 (ref 0.8–1.2)
Prothrombin Time: 13.9 s (ref 11.4–15.2)

## 2023-11-13 LAB — URINALYSIS, ROUTINE W REFLEX MICROSCOPIC
Bacteria, UA: NONE SEEN
Bilirubin Urine: NEGATIVE
Glucose, UA: NEGATIVE mg/dL
Ketones, ur: NEGATIVE mg/dL
Leukocytes,Ua: NEGATIVE
Nitrite: NEGATIVE
Protein, ur: NEGATIVE mg/dL
Specific Gravity, Urine: 1.013 (ref 1.005–1.030)
pH: 5 (ref 5.0–8.0)

## 2023-11-13 LAB — CBC
HCT: 39 % (ref 39.0–52.0)
Hemoglobin: 13.7 g/dL (ref 13.0–17.0)
MCH: 31 pg (ref 26.0–34.0)
MCHC: 35.1 g/dL (ref 30.0–36.0)
MCV: 88.2 fL (ref 80.0–100.0)
Platelets: 211 10*3/uL (ref 150–400)
RBC: 4.42 MIL/uL (ref 4.22–5.81)
RDW: 12 % (ref 11.5–15.5)
WBC: 6.8 10*3/uL (ref 4.0–10.5)
nRBC: 0 % (ref 0.0–0.2)

## 2023-11-13 LAB — COMPREHENSIVE METABOLIC PANEL WITH GFR
ALT: 17 U/L (ref 0–44)
AST: 20 U/L (ref 15–41)
Albumin: 4 g/dL (ref 3.5–5.0)
Alkaline Phosphatase: 77 U/L (ref 38–126)
Anion gap: 11 (ref 5–15)
BUN: 14 mg/dL (ref 8–23)
CO2: 27 mmol/L (ref 22–32)
Calcium: 9.7 mg/dL (ref 8.9–10.3)
Chloride: 102 mmol/L (ref 98–111)
Creatinine, Ser: 0.72 mg/dL (ref 0.61–1.24)
GFR, Estimated: >60 mL/min (ref >60)
Glucose, Bld: 120 mg/dL — ABNORMAL HIGH (ref 70–99)
Potassium: 4.6 mmol/L (ref 3.5–5.1)
Sodium: 140 mmol/L (ref 135–145)
Total Bilirubin: 1.1 mg/dL (ref 0.0–1.2)
Total Protein: 7 g/dL (ref 6.5–8.1)

## 2023-11-13 LAB — TYPE AND SCREEN
ABO/RH(D): O POS
Antibody Screen: NEGATIVE

## 2023-11-13 LAB — APTT: aPTT: 31 s (ref 24–36)

## 2023-11-13 LAB — SURGICAL PCR SCREEN
MRSA, PCR: NEGATIVE
Staphylococcus aureus: NEGATIVE

## 2023-11-13 NOTE — Telephone Encounter (Signed)
 Patient states he's a patient of Dr. Lula Sale from [facility] clinic.  Pt confirms an appt with Dr. Wayna Hails tomorrow, 5/21.  Request faxed to [facility] group for cardiac clearance.  Fax number provided to this nurse is [phone].

## 2023-11-13 NOTE — Progress Notes (Addendum)
 PCP - Dr. Jamee Mazzoni with [facility] at [facility] Cardiologist - Dr. 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 Once he has sinus surgery, he didn't have any OSA issues and did not need to wear CPAP.  No DM  Last dose of GLP1 agonist- n/a GLP1 instructions: n/a  Blood Thinner Instructions: n/a Aspirin  Instructions: Pt instructed to continue taking ASA through the morning of surgery  NPO after midnight  COVID TEST- n/a   Anesthesia review: Yes. Cardiac Clearance to be obtained at appointment tomorrow. Hx of CAD, MI (4 stents placed) and borderline EKG review.  Patient denies shortness of breath, fever, cough and chest pain at PAT appointment. Pt denies any respiratory illness/infection in the last two months.    All instructions explained to the patient, with a verbal understanding of the material. Patient agrees to go over the instructions while at home for a better understanding. Patient also instructed to self quarantine after being tested for COVID-19. The opportunity to ask questions was provided.

## 2023-11-14 ENCOUNTER — Telehealth: Payer: Self-pay

## 2023-11-14 NOTE — Progress Notes (Signed)
 Anesthesia Chart Review:  78 year old [facility] s/p PCI with unknown details prior to 2017, DES to [facility] and [facility] LCx in 09/2015, and most recently DES to LCx in 10/2021.  Last seen by Dr. Lula Sale 11/14/2023 for preop evaluation.  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  Preop labs reviewed, WNL.  EKG 11/13/2023: Normal sinus rhythm.  Rate 61. Non-specific intra-ventricular conduction delay  Cath and PCI 10/31/2021 (Care Everywhere): PCI of mid LCX  Left radial artery - 5/14F sheath  2.25/23mm Xience DES; post-dilated with 2.37mm noncompliant balloon: 80%  to 0  EBL<16ml  Heparin used; aspirin /clopidogrel   Coronary Findings Diagnostic Dominance: Right  Left Circumflex: Mid Cx lesion is 80% stenosed. Culprit lesion. Lesion length: 19 mm. TIMI flow is 3.      [facility] Short Stay Center/Anesthesiology Phone [phone] 11/14/2023 12:26 PM

## 2023-11-14 NOTE — Telephone Encounter (Signed)
 Received cardiac clearance from Dr. Lula Sale.  OK to proceed - remain on ASA. Paperwork sent to scanning.

## 2023-11-14 NOTE — Anesthesia Preprocedure Evaluation (Signed)
 Anesthesia Evaluation  Patient identified by MRN, date of birth, ID band Patient awake    Reviewed: Allergy & Precautions, NPO status , Patient's Chart, lab work & pertinent test results  History of Anesthesia Complications Negative for: history of anesthetic complications  Airway Mallampati: II  TM Distance: >3 FB Neck ROM: Full    Dental  (+) Dental Advisory Given, Edentulous Upper   Pulmonary sleep apnea , former smoker   Pulmonary exam normal        Cardiovascular hypertension, + CAD, + Past MI, + Cardiac Stents and + Peripheral Vascular Disease  Normal cardiovascular exam     Neuro/Psych  PSYCHIATRIC DISORDERS Anxiety Depression    CVA, Residual Symptoms    GI/Hepatic ,GERD  Medicated and Controlled,,(+) Hepatitis -, A  Endo/Other  negative endocrine ROS    Renal/GU negative Renal ROS     Musculoskeletal negative musculoskeletal ROS (+)    Abdominal   Peds  Hematology negative hematology ROS (+)   Anesthesia Other Findings   Reproductive/Obstetrics                             Anesthesia Physical Anesthesia Plan  ASA: 3  Anesthesia Plan: General   Post-op Pain Management: Tylenol  PO (pre-op)* and Regional block*   Induction: Intravenous  PONV Risk Score and Plan: 2 and Treatment may vary due to age or medical condition, Ondansetron  and TIVA  Airway Management Planned: LMA  Additional Equipment: None  Intra-op Plan:   Post-operative Plan: Extubation in OR  Informed Consent: I have reviewed the patients History and Physical, chart, labs and discussed the procedure including the risks, benefits and alternatives for the proposed anesthesia with the patient or authorized representative who has indicated his/her understanding and acceptance.     Dental advisory given  Plan Discussed with: CRNA and Anesthesiologist  Anesthesia Plan Comments: (PAT note by Rudy Costain, PA-C)         Anesthesia Quick Evaluation

## 2023-11-21 ENCOUNTER — Other Ambulatory Visit: Payer: Self-pay

## 2023-11-21 ENCOUNTER — Inpatient Hospital Stay (HOSPITAL_COMMUNITY): Payer: Self-pay | Admitting: Anesthesiology

## 2023-11-21 ENCOUNTER — Encounter (HOSPITAL_COMMUNITY)
Admission: RE | Disposition: A | Discharge status: Rehabilitation Facility | Payer: Self-pay | Source: Home / Self Care | Attending: Surgery

## 2023-11-21 ENCOUNTER — Inpatient Hospital Stay (HOSPITAL_COMMUNITY): Payer: Self-pay | Admitting: Physician Assistant

## 2023-11-21 ENCOUNTER — Inpatient Hospital Stay (HOSPITAL_COMMUNITY)
Admission: RE | Admit: 2023-11-21 | Discharge: 2023-11-26 | DRG: 041 | Disposition: A | Discharge status: Rehabilitation Facility | Attending: Surgery | Admitting: Surgery

## 2023-11-21 ENCOUNTER — Encounter (HOSPITAL_COMMUNITY): Payer: Self-pay | Admitting: Surgery

## 2023-11-21 DIAGNOSIS — K5901 Slow transit constipation: Secondary | ICD-10-CM | POA: Diagnosis not present

## 2023-11-21 DIAGNOSIS — Z7982 Long term (current) use of aspirin: Secondary | ICD-10-CM | POA: Diagnosis not present

## 2023-11-21 DIAGNOSIS — Z801 Family history of malignant neoplasm of trachea, bronchus and lung: Secondary | ICD-10-CM | POA: Diagnosis not present

## 2023-11-21 DIAGNOSIS — Z885 Allergy status to narcotic agent status: Secondary | ICD-10-CM | POA: Diagnosis not present

## 2023-11-21 DIAGNOSIS — I2510 Atherosclerotic heart disease of native coronary artery without angina pectoris: Secondary | ICD-10-CM | POA: Diagnosis present

## 2023-11-21 DIAGNOSIS — N400 Enlarged prostate without lower urinary tract symptoms: Secondary | ICD-10-CM | POA: Diagnosis present

## 2023-11-21 DIAGNOSIS — I10 Essential (primary) hypertension: Secondary | ICD-10-CM | POA: Diagnosis present

## 2023-11-21 DIAGNOSIS — S88112A Complete traumatic amputation at level between knee and ankle, left lower leg, initial encounter: Secondary | ICD-10-CM | POA: Diagnosis not present

## 2023-11-21 DIAGNOSIS — G8918 Other acute postprocedural pain: Secondary | ICD-10-CM | POA: Diagnosis not present

## 2023-11-21 DIAGNOSIS — Z955 Presence of coronary angioplasty implant and graft: Secondary | ICD-10-CM

## 2023-11-21 DIAGNOSIS — Z85828 Personal history of other malignant neoplasm of skin: Secondary | ICD-10-CM | POA: Diagnosis not present

## 2023-11-21 DIAGNOSIS — Z9182 Personal history of military deployment: Secondary | ICD-10-CM

## 2023-11-21 DIAGNOSIS — Z79899 Other long term (current) drug therapy: Secondary | ICD-10-CM

## 2023-11-21 DIAGNOSIS — G5792 Unspecified mononeuropathy of left lower limb: Secondary | ICD-10-CM

## 2023-11-21 DIAGNOSIS — Z87891 Personal history of nicotine dependence: Secondary | ICD-10-CM

## 2023-11-21 DIAGNOSIS — Z77098 Contact with and (suspected) exposure to other hazardous, chiefly nonmedicinal, chemicals: Secondary | ICD-10-CM | POA: Diagnosis present

## 2023-11-21 DIAGNOSIS — Z8249 Family history of ischemic heart disease and other diseases of the circulatory system: Secondary | ICD-10-CM | POA: Diagnosis not present

## 2023-11-21 DIAGNOSIS — K5900 Constipation, unspecified: Secondary | ICD-10-CM | POA: Diagnosis present

## 2023-11-21 DIAGNOSIS — L03116 Cellulitis of left lower limb: Secondary | ICD-10-CM | POA: Diagnosis not present

## 2023-11-21 DIAGNOSIS — I69354 Hemiplegia and hemiparesis following cerebral infarction affecting left non-dominant side: Secondary | ICD-10-CM | POA: Diagnosis not present

## 2023-11-21 DIAGNOSIS — Z881 Allergy status to other antibiotic agents status: Secondary | ICD-10-CM

## 2023-11-21 DIAGNOSIS — I739 Peripheral vascular disease, unspecified: Secondary | ICD-10-CM | POA: Diagnosis present

## 2023-11-21 DIAGNOSIS — Z7983 Long term (current) use of bisphosphonates: Secondary | ICD-10-CM | POA: Diagnosis not present

## 2023-11-21 DIAGNOSIS — G8929 Other chronic pain: Secondary | ICD-10-CM | POA: Diagnosis present

## 2023-11-21 DIAGNOSIS — E871 Hypo-osmolality and hyponatremia: Secondary | ICD-10-CM | POA: Diagnosis present

## 2023-11-21 DIAGNOSIS — E7800 Pure hypercholesterolemia, unspecified: Secondary | ICD-10-CM | POA: Diagnosis present

## 2023-11-21 DIAGNOSIS — D62 Acute posthemorrhagic anemia: Secondary | ICD-10-CM | POA: Diagnosis not present

## 2023-11-21 DIAGNOSIS — F4310 Post-traumatic stress disorder, unspecified: Secondary | ICD-10-CM | POA: Diagnosis present

## 2023-11-21 DIAGNOSIS — Z8701 Personal history of pneumonia (recurrent): Secondary | ICD-10-CM

## 2023-11-21 DIAGNOSIS — M79605 Pain in left leg: Secondary | ICD-10-CM

## 2023-11-21 DIAGNOSIS — K219 Gastro-esophageal reflux disease without esophagitis: Secondary | ICD-10-CM | POA: Diagnosis present

## 2023-11-21 DIAGNOSIS — I252 Old myocardial infarction: Secondary | ICD-10-CM | POA: Diagnosis not present

## 2023-11-21 DIAGNOSIS — Z89512 Acquired absence of left leg below knee: Secondary | ICD-10-CM | POA: Diagnosis not present

## 2023-11-21 DIAGNOSIS — M79606 Pain in leg, unspecified: Secondary | ICD-10-CM | POA: Diagnosis present

## 2023-11-21 HISTORY — PX: AMPUTATION: SHX166

## 2023-11-21 LAB — ABO/RH: ABO/RH(D): O POS

## 2023-11-21 LAB — CBC
HCT: 33 % — ABNORMAL LOW (ref 39.0–52.0)
Hemoglobin: 11.3 g/dL — ABNORMAL LOW (ref 13.0–17.0)
MCH: 30.7 pg (ref 26.0–34.0)
MCHC: 34.2 g/dL (ref 30.0–36.0)
MCV: 89.7 fL (ref 80.0–100.0)
Platelets: 182 10*3/uL (ref 150–400)
RBC: 3.68 MIL/uL — ABNORMAL LOW (ref 4.22–5.81)
RDW: 11.9 % (ref 11.5–15.5)
WBC: 9.3 10*3/uL (ref 4.0–10.5)
nRBC: 0 % (ref 0.0–0.2)

## 2023-11-21 LAB — CREATININE, SERUM
Creatinine, Ser: 0.72 mg/dL (ref 0.61–1.24)
GFR, Estimated: >60 mL/min (ref >60)

## 2023-11-21 SURGERY — AMPUTATION BELOW KNEE
Anesthesia: General | Site: Knee | Laterality: Left

## 2023-11-21 MED ORDER — CHLORHEXIDINE GLUCONATE CLOTH 2 % EX PADS: 6.0000 | MEDICATED_PAD | Freq: Once | CUTANEOUS | Status: DC

## 2023-11-21 MED ORDER — DIPHENHYDRAMINE HCL 50 MG/ML IJ SOLN
6.2500 mg | Freq: Once | INTRAMUSCULAR | Status: AC
  Administered 2023-11-21: 6.25 mg via INTRAVENOUS

## 2023-11-21 MED ORDER — METOPROLOL TARTRATE 5 MG/5ML IV SOLN: 2.0000 mg | INTRAVENOUS | Status: DC | PRN

## 2023-11-21 MED ORDER — BUPIVACAINE LIPOSOME 1.3 % IJ SUSP
INTRAMUSCULAR | Status: AC
  Filled 2023-11-21: qty 20

## 2023-11-21 MED ORDER — DOCUSATE SODIUM 100 MG PO CAPS
100.0000 mg | ORAL_CAPSULE | Freq: Every day | ORAL | Status: DC
  Administered 2023-11-22 – 2023-11-25 (×4): 100 mg via ORAL
  Filled 2023-11-21 (×5): qty 1

## 2023-11-21 MED ORDER — ALUM & MAG HYDROXIDE-SIMETH 200-200-20 MG/5ML PO SUSP: 15.0000 mL | ORAL | Status: DC | PRN

## 2023-11-21 MED ORDER — OXYCODONE-ACETAMINOPHEN 5-325 MG PO TABS
1.0000 | ORAL_TABLET | ORAL | Status: DC | PRN
  Administered 2023-11-21: 1 via ORAL
  Administered 2023-11-21 – 2023-11-25 (×11): 2 via ORAL
  Filled 2023-11-21 (×12): qty 2

## 2023-11-21 MED ORDER — HYDRALAZINE HCL 20 MG/ML IJ SOLN: 5.0000 mg | INTRAMUSCULAR | Status: DC | PRN

## 2023-11-21 MED ORDER — LABETALOL HCL 5 MG/ML IV SOLN: 10.0000 mg | INTRAVENOUS | Status: DC | PRN

## 2023-11-21 MED ORDER — DULOXETINE HCL 30 MG PO CPEP
30.0000 mg | ORAL_CAPSULE | Freq: Every day | ORAL | Status: DC
  Administered 2023-11-21 – 2023-11-26 (×6): 30 mg via ORAL
  Filled 2023-11-21 (×6): qty 1

## 2023-11-21 MED ORDER — POTASSIUM CHLORIDE CRYS ER 20 MEQ PO TBCR: 20.0000 meq | EXTENDED_RELEASE_TABLET | Freq: Every day | ORAL | Status: DC | PRN

## 2023-11-21 MED ORDER — EPHEDRINE SULFATE-NACL 50-0.9 MG/10ML-% IV SOSY
PREFILLED_SYRINGE | INTRAVENOUS | Status: DC | PRN
  Administered 2023-11-21 (×3): 5 mg via INTRAVENOUS

## 2023-11-21 MED ORDER — ORAL CARE MOUTH RINSE: 15.0000 mL | Freq: Once | OROMUCOSAL | Status: AC

## 2023-11-21 MED ORDER — PHENOL 1.4 % MT LIQD: 1.0000 | OROMUCOSAL | Status: DC | PRN

## 2023-11-21 MED ORDER — PROPOFOL 10 MG/ML IV BOLUS
INTRAVENOUS | Status: DC | PRN
  Administered 2023-11-21: 100 mg via INTRAVENOUS

## 2023-11-21 MED ORDER — ONDANSETRON HCL 4 MG/2ML IJ SOLN: 4.0000 mg | Freq: Once | INTRAMUSCULAR | Status: DC | PRN

## 2023-11-21 MED ORDER — MORPHINE SULFATE (PF) 2 MG/ML IV SOLN
2.0000 mg | INTRAVENOUS | Status: DC | PRN
  Administered 2023-11-21 – 2023-11-24 (×5): 2 mg via INTRAVENOUS
  Filled 2023-11-21 (×6): qty 1

## 2023-11-21 MED ORDER — LIDOCAINE 2% (20 MG/ML) 5 ML SYRINGE
INTRAMUSCULAR | Status: DC | PRN
  Administered 2023-11-21: 60 mg via INTRAVENOUS

## 2023-11-21 MED ORDER — CEFAZOLIN SODIUM-DEXTROSE 2-4 GM/100ML-% IV SOLN
2.0000 g | INTRAVENOUS | Status: AC
  Administered 2023-11-21: 2 g via INTRAVENOUS

## 2023-11-21 MED ORDER — ATORVASTATIN CALCIUM 80 MG PO TABS
80.0000 mg | ORAL_TABLET | Freq: Every day | ORAL | Status: DC
  Administered 2023-11-21 – 2023-11-26 (×6): 80 mg via ORAL
  Filled 2023-11-21 (×6): qty 1

## 2023-11-21 MED ORDER — HEPARIN SODIUM (PORCINE) 5000 UNIT/ML IJ SOLN
5000.0000 [IU] | Freq: Three times a day (TID) | INTRAMUSCULAR | Status: DC
  Administered 2023-11-22 – 2023-11-26 (×14): 5000 [IU] via SUBCUTANEOUS
  Filled 2023-11-21 (×13): qty 1

## 2023-11-21 MED ORDER — BUPIVACAINE LIPOSOME 1.3 % IJ SUSP
INTRAMUSCULAR | Status: DC | PRN
Start: 2023-11-21 — End: 2023-11-21
  Administered 2023-11-21 (×2): 10 mL via PERINEURAL

## 2023-11-21 MED ORDER — CEFAZOLIN SODIUM-DEXTROSE 2-4 GM/100ML-% IV SOLN
INTRAVENOUS | Status: AC
  Filled 2023-11-21: qty 100

## 2023-11-21 MED ORDER — FENTANYL CITRATE (PF) 250 MCG/5ML IJ SOLN
INTRAMUSCULAR | Status: AC
  Filled 2023-11-21: qty 5

## 2023-11-21 MED ORDER — BUPIVACAINE HCL (PF) 0.5 % IJ SOLN
INTRAMUSCULAR | Status: DC | PRN
Start: 2023-11-21 — End: 2023-11-21
  Administered 2023-11-21: 10 mL
  Administered 2023-11-21: 15 mL

## 2023-11-21 MED ORDER — ACETAMINOPHEN 500 MG PO TABS
ORAL_TABLET | ORAL | Status: AC
  Administered 2023-11-21: 1000 mg via ORAL
  Filled 2023-11-21: qty 2

## 2023-11-21 MED ORDER — LACTATED RINGERS IV SOLN: INTRAVENOUS | Status: DC

## 2023-11-21 MED ORDER — CEFAZOLIN SODIUM-DEXTROSE 2-4 GM/100ML-% IV SOLN
2.0000 g | Freq: Three times a day (TID) | INTRAVENOUS | Status: AC
  Administered 2023-11-21 (×2): 2 g via INTRAVENOUS
  Filled 2023-11-21 (×2): qty 100

## 2023-11-21 MED ORDER — PANTOPRAZOLE SODIUM 40 MG PO TBEC
40.0000 mg | DELAYED_RELEASE_TABLET | Freq: Every day | ORAL | Status: DC
  Administered 2023-11-21 – 2023-11-26 (×6): 40 mg via ORAL
  Filled 2023-11-21 (×6): qty 1

## 2023-11-21 MED ORDER — ONDANSETRON HCL 4 MG/2ML IJ SOLN
INTRAMUSCULAR | Status: DC | PRN
  Administered 2023-11-21: 4 mg via INTRAVENOUS

## 2023-11-21 MED ORDER — OXYCODONE HCL 5 MG PO TABS: 5.0000 mg | ORAL_TABLET | Freq: Once | ORAL | Status: DC | PRN

## 2023-11-21 MED ORDER — POLYETHYLENE GLYCOL 3350 17 G PO PACK: 17.0000 g | PACK | Freq: Every day | ORAL | Status: DC | PRN

## 2023-11-21 MED ORDER — TAMSULOSIN HCL 0.4 MG PO CAPS
0.4000 mg | ORAL_CAPSULE | Freq: Every day | ORAL | Status: DC
  Administered 2023-11-21 – 2023-11-26 (×6): 0.4 mg via ORAL
  Filled 2023-11-21 (×6): qty 1

## 2023-11-21 MED ORDER — 0.9 % SODIUM CHLORIDE (POUR BTL) OPTIME
TOPICAL | Status: DC | PRN
Start: 2023-11-21 — End: 2023-11-21
  Administered 2023-11-21: 1000 mL

## 2023-11-21 MED ORDER — ACETAMINOPHEN 500 MG PO TABS: 1000.0000 mg | ORAL_TABLET | Freq: Once | ORAL | Status: AC

## 2023-11-21 MED ORDER — SODIUM CHLORIDE 0.9 % IV SOLN: INTRAVENOUS | Status: DC

## 2023-11-21 MED ORDER — SODIUM CHLORIDE 0.9 % IV SOLN
INTRAVENOUS | Status: AC
Start: 2023-11-21 — End: 2023-11-22

## 2023-11-21 MED ORDER — FENTANYL CITRATE (PF) 100 MCG/2ML IJ SOLN
25.0000 ug | INTRAMUSCULAR | Status: DC | PRN
  Administered 2023-11-21 (×2): 25 ug via INTRAVENOUS

## 2023-11-21 MED ORDER — BACITRACIN ZINC 500 UNIT/GM EX OINT
TOPICAL_OINTMENT | CUTANEOUS | Status: DC | PRN
  Administered 2023-11-21: 1 via TOPICAL

## 2023-11-21 MED ORDER — ONDANSETRON HCL 4 MG/2ML IJ SOLN: 4.0000 mg | Freq: Four times a day (QID) | INTRAMUSCULAR | Status: DC | PRN

## 2023-11-21 MED ORDER — PHENYLEPHRINE HCL-NACL 20-0.9 MG/250ML-% IV SOLN
INTRAVENOUS | Status: DC | PRN
  Administered 2023-11-21: 40 ug/min via INTRAVENOUS

## 2023-11-21 MED ORDER — ALBUMIN HUMAN 5 % IV SOLN
INTRAVENOUS | Status: DC | PRN
Start: 2023-11-21 — End: 2023-11-21

## 2023-11-21 MED ORDER — BACITRACIN ZINC 500 UNIT/GM EX OINT
TOPICAL_OINTMENT | CUTANEOUS | Status: AC
  Filled 2023-11-21: qty 28.35

## 2023-11-21 MED ORDER — VITAMIN B-12 1000 MCG PO TABS
500.0000 ug | ORAL_TABLET | Freq: Every day | ORAL | Status: DC
  Administered 2023-11-21 – 2023-11-26 (×6): 500 ug via ORAL
  Filled 2023-11-21 (×7): qty 1

## 2023-11-21 MED ORDER — BISACODYL 10 MG RE SUPP: 10.0000 mg | Freq: Every day | RECTAL | Status: DC | PRN

## 2023-11-21 MED ORDER — OXYCODONE HCL 5 MG/5ML PO SOLN: 5.0000 mg | Freq: Once | ORAL | Status: DC | PRN

## 2023-11-21 MED ORDER — CHLORHEXIDINE GLUCONATE 0.12 % MT SOLN
OROMUCOSAL | Status: AC
  Administered 2023-11-21: 15 mL via OROMUCOSAL
  Filled 2023-11-21: qty 15

## 2023-11-21 MED ORDER — FENTANYL CITRATE (PF) 250 MCG/5ML IJ SOLN
INTRAMUSCULAR | Status: DC | PRN
  Administered 2023-11-21: 50 ug via INTRAVENOUS
  Administered 2023-11-21 (×2): 25 ug via INTRAVENOUS

## 2023-11-21 MED ORDER — OXYCODONE-ACETAMINOPHEN 5-325 MG PO TABS
ORAL_TABLET | ORAL | Status: AC
  Filled 2023-11-21: qty 1

## 2023-11-21 MED ORDER — MAGNESIUM SULFATE 2 GM/50ML IV SOLN: 2.0000 g | Freq: Every day | INTRAVENOUS | Status: DC | PRN

## 2023-11-21 MED ORDER — DIPHENHYDRAMINE HCL 50 MG/ML IJ SOLN
INTRAMUSCULAR | Status: AC
  Filled 2023-11-21: qty 1

## 2023-11-21 MED ORDER — BUPROPION HCL ER (XL) 150 MG PO TB24
150.0000 mg | ORAL_TABLET | Freq: Every day | ORAL | Status: DC
  Administered 2023-11-21 – 2023-11-26 (×6): 150 mg via ORAL
  Filled 2023-11-21 (×6): qty 1

## 2023-11-21 MED ORDER — SURGIFLO WITH THROMBIN (HEMOSTATIC MATRIX KIT) OPTIME
TOPICAL | Status: DC | PRN
  Administered 2023-11-21: 1 via TOPICAL

## 2023-11-21 MED ORDER — CHLORHEXIDINE GLUCONATE 0.12 % MT SOLN: 15.0000 mL | Freq: Once | OROMUCOSAL | Status: AC

## 2023-11-21 MED ORDER — ASPIRIN 81 MG PO TBEC
81.0000 mg | DELAYED_RELEASE_TABLET | Freq: Every day | ORAL | Status: DC
  Administered 2023-11-22 – 2023-11-26 (×5): 81 mg via ORAL
  Filled 2023-11-21 (×5): qty 1

## 2023-11-21 MED ORDER — FENTANYL CITRATE (PF) 100 MCG/2ML IJ SOLN
INTRAMUSCULAR | Status: AC
Start: 2023-11-21 — End: ?
  Filled 2023-11-21: qty 2

## 2023-11-21 MED ORDER — POLYVINYL ALCOHOL 1.4 % OP SOLN
1.0000 [drp] | Freq: Three times a day (TID) | OPHTHALMIC | Status: DC | PRN
  Filled 2023-11-21: qty 15

## 2023-11-21 MED ORDER — PHENYLEPHRINE 80 MCG/ML (10ML) SYRINGE FOR IV PUSH (FOR BLOOD PRESSURE SUPPORT)
PREFILLED_SYRINGE | INTRAVENOUS | Status: DC | PRN
  Administered 2023-11-21: 160 ug via INTRAVENOUS

## 2023-11-21 MED ORDER — GUAIFENESIN-DM 100-10 MG/5ML PO SYRP
15.0000 mL | ORAL_SOLUTION | ORAL | Status: DC | PRN
  Filled 2023-11-21: qty 15

## 2023-11-21 MED ORDER — PROPOFOL 10 MG/ML IV BOLUS
INTRAVENOUS | Status: AC
  Filled 2023-11-21: qty 20

## 2023-11-21 SURGICAL SUPPLY — 50 items
BAG COUNTER SPONGE SURGICOUNT (BAG) ×1 IMPLANT
BLADE SAW GIGLI 510 (BLADE) ×1 IMPLANT
BNDG COHESIVE 6X5 TAN ST LF (GAUZE/BANDAGES/DRESSINGS) ×1 IMPLANT
BNDG ELASTIC 4INX 5YD STR LF (GAUZE/BANDAGES/DRESSINGS) IMPLANT
BNDG ELASTIC 4X5.8 VLCR STR LF (GAUZE/BANDAGES/DRESSINGS) ×1 IMPLANT
BNDG ELASTIC 6INX 5YD STR LF (GAUZE/BANDAGES/DRESSINGS) ×1 IMPLANT
BNDG GAUZE DERMACEA FLUFF 4 (GAUZE/BANDAGES/DRESSINGS) IMPLANT
CANISTER SUCTION 3000ML PPV (SUCTIONS) ×1 IMPLANT
CLIP TI MEDIUM 6 (CLIP) IMPLANT
CNTNR URN SCR LID CUP LEK RST (MISCELLANEOUS) ×1 IMPLANT
COVER SURGICAL LIGHT HANDLE (MISCELLANEOUS) ×1 IMPLANT
CUFF TOURN SGL QUICK 42 (TOURNIQUET CUFF) IMPLANT
CUFF TRNQT CYL 34X4.125X (TOURNIQUET CUFF) IMPLANT
DRAIN CHANNEL 19F RND (DRAIN) IMPLANT
DRAPE DERMATAC (DRAPES) IMPLANT
DRAPE HALF SHEET 40X57 (DRAPES) ×1 IMPLANT
DRAPE INCISE IOBAN 66X45 STRL (DRAPES) IMPLANT
DRAPE SURG ORHT 6 SPLT 77X108 (DRAPES) ×2 IMPLANT
DRSG ADAPTIC 3X8 NADH LF (GAUZE/BANDAGES/DRESSINGS) ×1 IMPLANT
ELECTRODE REM PT RTRN 9FT ADLT (ELECTROSURGICAL) ×1 IMPLANT
EVACUATOR SILICONE 100CC (DRAIN) IMPLANT
GAUZE 4X4 16PLY ~~LOC~~+RFID DBL (SPONGE) ×1 IMPLANT
GAUZE SPONGE 4X4 12PLY STRL (GAUZE/BANDAGES/DRESSINGS) ×1 IMPLANT
GLOVE BIO SURGEON STRL SZ 6 (GLOVE) IMPLANT
GLOVE SURG SS PI 7.5 STRL IVOR (GLOVE) ×3 IMPLANT
GOWN STRL REUS W/ TWL LRG LVL3 (GOWN DISPOSABLE) ×2 IMPLANT
GOWN STRL REUS W/ TWL XL LVL3 (GOWN DISPOSABLE) ×1 IMPLANT
KIT BASIN OR (CUSTOM PROCEDURE TRAY) ×1 IMPLANT
KIT TURNOVER KIT B (KITS) ×1 IMPLANT
NDL HYPO 25GX1X1/2 BEV (NEEDLE) ×1 IMPLANT
NEEDLE HYPO 25GX1X1/2 BEV (NEEDLE) ×1 IMPLANT
NS IRRIG 1000ML POUR BTL (IV SOLUTION) ×1 IMPLANT
PACK GENERAL/GYN (CUSTOM PROCEDURE TRAY) ×1 IMPLANT
PAD ARMBOARD POSITIONER FOAM (MISCELLANEOUS) ×2 IMPLANT
SHRINKER PROSTHETIC B/K (ORTHOPEDIC SUPPLIES) IMPLANT
SPONGE T-LAP 18X18 ~~LOC~~+RFID (SPONGE) IMPLANT
STAPLER SKIN PROX 35W (STAPLE) ×1 IMPLANT
STOCKINETTE IMPERVIOUS LG (DRAPES) ×1 IMPLANT
SUT BONE WAX W31G (SUTURE) IMPLANT
SUT ETHILON 3 0 PS 1 (SUTURE) IMPLANT
SUT SILK 0 TIES 10X30 (SUTURE) ×1 IMPLANT
SUT SILK 2 0 SH CR/8 (SUTURE) ×1 IMPLANT
SUT SILK 2-0 18XBRD TIE 12 (SUTURE) IMPLANT
SUT SILK 3-0 18XBRD TIE 12 (SUTURE) IMPLANT
SUT VIC AB 2-0 CT1 18 (SUTURE) ×2 IMPLANT
SYR CONTROL 10ML LL (SYRINGE) ×1 IMPLANT
TAPE UMBILICAL 1/8X30 (MISCELLANEOUS) IMPLANT
TOWEL GREEN STERILE (TOWEL DISPOSABLE) ×2 IMPLANT
UNDERPAD 30X36 HEAVY ABSORB (UNDERPADS AND DIAPERS) ×1 IMPLANT
WATER STERILE IRR 1000ML POUR (IV SOLUTION) ×1 IMPLANT

## 2023-11-21 NOTE — Progress Notes (Signed)
 Inpatient Rehab Admissions Coordinator:    CIR consult received. Await therapy evaluations and recommendations.   Wandalee Gust, MS, CCC-SLP Rehab Admissions Coordinator  [phone] (celll) [phone] (office)

## 2023-11-21 NOTE — Anesthesia Procedure Notes (Signed)
 Procedure Name: LMA Insertion Date/Time: 11/21/2023 7:50 AM  Performed by: Merna Aase, CRNAPre-anesthesia Checklist: Patient identified, Patient being monitored, Timeout performed, Emergency Drugs available and Suction available Patient Re-evaluated:Patient Re-evaluated prior to induction Oxygen Delivery Method: Circle system utilized Preoxygenation: Pre-oxygenation with 100% oxygen Induction Type: IV induction Ventilation: Mask ventilation without difficulty LMA: LMA inserted LMA Size: 4.0 Tube type: Oral Number of attempts: 1 Placement Confirmation: positive ETCO2 and breath sounds checked- equal and bilateral Tube secured with: Tape Dental Injury: Teeth and Oropharynx as per pre-operative assessment

## 2023-11-21 NOTE — Anesthesia Procedure Notes (Signed)
 Anesthesia Regional Block: Popliteal block   Pre-Anesthetic Checklist: , timeout performed,  Correct Patient, Correct Site, Correct Laterality,  Correct Procedure, Correct Position, site marked,  Risks and benefits discussed,  Surgical consent,  Pre-op evaluation,  At surgeon's request and post-op pain management  Laterality: Left  Prep: chloraprep       Needles:  Injection technique: Single-shot  Needle Type: Echogenic Needle     Needle Length: 10cm  Needle Gauge: 21     Additional Needles:   Narrative:  Start time: 11/21/2023 7:22 AM End time: 11/21/2023 7:25 AM Injection made incrementally with aspirations every 5 mL.  Performed by: Personally  Anesthesiologist: Juventino Oppenheim, MD  Additional Notes: No pain on injection. No increased resistance to injection. Injection made in 5cc increments. Good needle visualization. Patient tolerated the procedure well.

## 2023-11-21 NOTE — Op Note (Signed)
    Patient name: [name] [name] MRN: [id number] DOB: 1945/08/22 Sex: male  11/21/2023 Pre-operative Diagnosis: Intractable left leg pain Post-operative diagnosis:  Same Surgeon:  [name] Assistants:  Maryanna Smart. PA Procedure:   Left below-knee amputation Anesthesia:  General Blood Loss:  minimal Specimens: Left leg  Findings: Healthy tissue margins  Indications: This is a 78 year old gentleman with intractable left leg pain who is requesting amputation.  I sent him to neurology for help with determining the etiology of his pain.  He is determined to proceed with amputation.  I discussed that there is a possibility of a more proximal amputation if this does not heal.  Also discussed that this may not completely resolve his symptoms.  He wishes to proceed with his family's blessing.  Procedure:  The patient was identified in the holding area and taken to [facility] OR ROOM 16  The patient was then placed supine on the table. general anesthesia was administered.  The patient was prepped and draped in the usual sterile fashion.  A time out was called and antibiotics were administered.  Anesthesia did a nerve block with Exparel .  A PA was necessary to expedite the procedure and assist with technical details.  She helped with exposure by providing suction and retraction.  She helped with wound closure.  A two thirds anterior, one third posterior skin flap incision was made approximately 12 cm below the tibial tuberosity.  Cautery was used divide subcutaneous tissue down to the fascia which was opened with cautery.  I then divided the muscle with cautery to circumferentially expose the tibia.  Periosteal elevator was used to elevate the periosteum exposing the more proximal tibia.  I then continued with cautery division of the muscle until identified the anterior compartment neurovascular bundle.  This was circumferentially mobilized.  I then exposed the fibula circumferentially and elevated the  periosteum.  Next, a Gigli saw was used to transect the tibia, beveling the anterior surface.  Large bone cutters were used to transect the fibula proximal to the cut edge of the tibia.  The anterior neurovascular bundle was then ligated proximal to the fibula with a 0 silk tie.  I then dissected out the main neurovascular bundle and divided this between vascular clamps.  An amputation knife was then used to divide the remaining tissue and the leg was sent as a specimen.  Individually dissected out the neurovascular bundle and ligated the artery nerve and vein proximal to the cut edge of the tibia with 0 silk ties.  The wound was then copiously irrigated.  A rasp was used to smooth the bone surface edges.  Hemostasis was achieved.  The fascia was then reapproximated with interrupted 2-0 Vicryl.  The skin was closed with staples followed by sterile dressing.  There were no complications   Disposition: To PACU stable   V. [name], M.D., [facility] [place] Office: [phone] Pager:  [phone]

## 2023-11-21 NOTE — Interval H&P Note (Signed)
 History and Physical Interval Note:  11/21/2023 7:20 AM  [name] [name]  has presented today for surgery, with the diagnosis of MONONEUROPATHY LLE.  The various methods of treatment have been discussed with the patient and family. After consideration of risks, benefits and other options for treatment, the patient has consented to  Procedure(s): AMPUTATION BELOW KNEE (Left) as a surgical intervention.  The patient's history has been reviewed, patient examined, no change in status, stable for surgery.  I have reviewed the patient's chart and labs.  Questions were answered to the patient's satisfaction.     [name]

## 2023-11-21 NOTE — Transfer of Care (Signed)
 Immediate Anesthesia Transfer of Care Note  Patient: [name] [name]  Procedure(s) Performed: AMPUTATION BELOW KNEE (Left: Knee)  Patient Location: PACU  Anesthesia Type:General  Level of Consciousness: drowsy and responds to stimulation  Airway & Oxygen Therapy: Patient Spontanous Breathing  Post-op Assessment: Report given to RN and Post -op Vital signs reviewed and stable  Post vital signs: Reviewed and stable  Last Vitals:  Vitals Value Taken Time  BP 122/72 11/21/23 0938  Temp    Pulse 75 11/21/23 0940  Resp 16 11/21/23 0940  SpO2 96 % 11/21/23 0940  Vitals shown include unfiled device data.  Last Pain:  Vitals:   11/21/23 0602  TempSrc: Oral  PainSc: 6       Patients Stated Pain Goal: 3 (11/21/23 0602)  Complications: No notable events documented.

## 2023-11-21 NOTE — Anesthesia Procedure Notes (Signed)
 Anesthesia Regional Block: Adductor canal block   Pre-Anesthetic Checklist: , timeout performed,  Correct Patient, Correct Site, Correct Laterality,  Correct Procedure, Correct Position, site marked,  Risks and benefits discussed,  Surgical consent,  Pre-op evaluation,  At surgeon's request and post-op pain management  Laterality: Left  Prep: chloraprep       Needles:  Injection technique: Single-shot  Needle Type: Echogenic Needle     Needle Length: 10cm  Needle Gauge: 21     Additional Needles:   Narrative:  Start time: 11/21/2023 7:20 AM End time: 11/21/2023 7:22 AM Injection made incrementally with aspirations every 5 mL.  Performed by: Personally  Anesthesiologist: Juventino Oppenheim, MD  Additional Notes: No pain on injection. No increased resistance to injection. Injection made in 5cc increments. Good needle visualization. Patient tolerated the procedure well.

## 2023-11-21 NOTE — Anesthesia Postprocedure Evaluation (Signed)
 Anesthesia Post Note  Patient: [name] [name]  Procedure(s) Performed: AMPUTATION BELOW KNEE (Left: Knee)     Patient location during evaluation: PACU Anesthesia Type: General Level of consciousness: awake and alert Pain management: pain level controlled Vital Signs Assessment: post-procedure vital signs reviewed and stable Respiratory status: spontaneous breathing, nonlabored ventilation and respiratory function stable Cardiovascular status: stable and blood pressure returned to baseline Anesthetic complications: no  No notable events documented.  Last Vitals:  Vitals:   11/21/23 1015 11/21/23 1030  BP: 109/68 107/70  Pulse: 65 61  Resp: 15 18  Temp:  36.6 C  SpO2: 96% 97%       LLE Motor Response: Purposeful movement (11/21/23 1030) LLE Sensation: Decreased (11/21/23 1030)          Juventino Oppenheim

## 2023-11-21 NOTE — Progress Notes (Signed)
 Dr. Glenetta Lane made aware of patient's temperature this am.  Tylenol  was given and will continue to monitor.

## 2023-11-22 ENCOUNTER — Encounter (HOSPITAL_COMMUNITY): Payer: Self-pay | Admitting: Surgery

## 2023-11-22 LAB — BASIC METABOLIC PANEL WITH GFR
Anion gap: 5 (ref 5–15)
BUN: 11 mg/dL (ref 8–23)
CO2: 27 mmol/L (ref 22–32)
Calcium: 8 mg/dL — ABNORMAL LOW (ref 8.9–10.3)
Chloride: 107 mmol/L (ref 98–111)
Creatinine, Ser: 0.78 mg/dL (ref 0.61–1.24)
GFR, Estimated: >60 mL/min (ref >60)
Glucose, Bld: 152 mg/dL — ABNORMAL HIGH (ref 70–99)
Potassium: 4.2 mmol/L (ref 3.5–5.1)
Sodium: 139 mmol/L (ref 135–145)

## 2023-11-22 LAB — CBC
HCT: 30.3 % — ABNORMAL LOW (ref 39.0–52.0)
Hemoglobin: 10.4 g/dL — ABNORMAL LOW (ref 13.0–17.0)
MCH: 31.2 pg (ref 26.0–34.0)
MCHC: 34.3 g/dL (ref 30.0–36.0)
MCV: 91 fL (ref 80.0–100.0)
Platelets: 164 10*3/uL (ref 150–400)
RBC: 3.33 MIL/uL — ABNORMAL LOW (ref 4.22–5.81)
RDW: 11.9 % (ref 11.5–15.5)
WBC: 8.3 10*3/uL (ref 4.0–10.5)
nRBC: 0 % (ref 0.0–0.2)

## 2023-11-22 LAB — SURGICAL PATHOLOGY

## 2023-11-22 MED ORDER — SODIUM CHLORIDE 0.9 % IV SOLN
25.0000 mg | INTRAVENOUS | Status: DC | PRN
  Administered 2023-11-22: 25 mg via INTRAVENOUS
  Filled 2023-11-22 (×2): qty 0.5

## 2023-11-22 NOTE — Progress Notes (Signed)
 Inpatient Rehab Admissions Coordinator:    I spoke with Pt. Regarding potential CIR admit He is interested and states wife can provide 24/7 support at d/c. States he uses [facility], I will send case to them once medically ready.   Wandalee Gust, MS, CCC-SLP Rehab Admissions Coordinator  [phone] (celll) [phone] (office)

## 2023-11-22 NOTE — Evaluation (Signed)
 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 PMH: MI, CAD, anxiety, depression, HLD, PTSD, CVA.  Clinical Impression  Pt seated on EOB upon arrival with wife present and agreeable to PT eval. PTA, pt was ModI with a SP cane. In today's session, pt required CGA to stand with RW and MinA to ambulate two sets of 8 ft. Pt was slightly unsteady with backwards hops required MinA to stabilize. Educated pt on maintaining L knee ROM, providing tactile stimulation to residual limb, and keeping leg in extension when in supine. Pt has 24/7 assistance available upon return home. Anticipate pt will progress well with high levels of motivation, strong family support, and active PLOF. Recommending >3hrs post acute rehab to maximize rehab potential. Pt presents with deficits listed below. Acute PT to follow.       If plan is discharge home, recommend the following: A little help with walking and/or transfers;A little help with bathing/dressing/bathroom;Assist for transportation;Help with stairs or ramp for entrance;Assistance with cooking/housework   Can travel by private vehicle    Yes    Equipment Recommendations Rolling walker (2 wheels)  Recommendations for Other Services  Rehab consult    Functional Status Assessment Patient has had a recent decline in their functional status and demonstrates the ability to make significant improvements in function in a reasonable and predictable amount of time.     Precautions / Restrictions Precautions Precautions: Fall Recall of Precautions/Restrictions: Intact Required Braces or Orthoses: Other Brace Other Brace: L limb protector Restrictions Weight Bearing Restrictions Per Provider Order: Yes LLE Weight Bearing Per Provider Order: Non weight bearing       Mobility  Bed Mobility Overal bed mobility: Modified Independent     Transfers Overall transfer level: Needs assistance Equipment used: Rolling walker (2 wheels) Transfers: Sit to/from Stand Sit to Stand: Contact guard assist    General transfer comment: Cues for hand placement, CGA for safety    Ambulation/Gait Ambulation/Gait assistance: Min assist Gait Distance (Feet): 8 Feet (x2) Assistive device: Rolling walker (2 wheels) Gait Pattern/deviations:  (hop to)      General Gait Details: able to take hops forwards/backwards with RW. MinA for slight loss of balance with backwards hops    Balance Overall balance assessment: Needs assistance Sitting-balance support: No upper extremity supported Sitting balance-Leahy Scale: Good     Standing balance support: Bilateral upper extremity supported, During functional activity, Reliant on assistive device for balance Standing balance-Leahy Scale: Poor Standing balance comment: reliant on UE support       Pertinent Vitals/Pain Pain Assessment Pain Assessment: Faces Faces Pain Scale: Hurts whole lot Pain Location: L LE Pain Descriptors / Indicators: Throbbing, Grimacing, Guarding Pain Intervention(s): Limited activity within patient's tolerance, Monitored during session, Repositioned, RN gave pain meds during session    Home Living Family/patient expects to be discharged to:: Private residence Living Arrangements: Spouse/significant other Available Help at Discharge: Family;Available 24 hours/day;Other (Comment) (wife cannot provide physical assist) Type of Home: House Home Access: Stairs to enter;Other (comment) ([facility])   Entrance Stairs-Number of Steps: 2   Home Layout: One level Home Equipment: Rollator (4 wheels);Cane - single point;Shower seat;Electric scooter;Wheelchair - manual;Other (comment) ([facility] is getting [facility] transport chair)      Prior Function Prior Level of Function :  Independent/Modified Independent    Mobility Comments: walks with a cane       Extremity/Trunk Assessment   Upper Extremity Assessment Upper Extremity Assessment: Defer to OT evaluation    Lower Extremity Assessment Lower Extremity Assessment: LLE deficits/detail LLE Deficits / Details: Limited knee flexion ROM. Able to SLR and activate quad LLE Sensation: WNL    Cervical / Trunk Assessment Cervical / Trunk Assessment: Normal  Communication   Communication Communication: Impaired Factors Affecting Communication: Hearing impaired    Cognition Arousal: Alert Behavior During Therapy: WFL for tasks assessed/performed   PT - Cognitive impairments: No apparent impairments    Following commands: Intact       Cueing Cueing Techniques: Verbal cues     General Comments General comments (skin integrity, edema, etc.): Discussed providing tactile stimulation to residual limb and keeping limb in extension when resting in supine. Gave [employer] HEP- #[id number]    Exercises Amputee Exercises Quad Sets: AROM, Both, Left, 5 reps, Supine Hip ABduction/ADduction: AROM, Left, 5 reps, Seated Knee Flexion: AROM, Left, 5 reps, Seated Knee Extension: AROM, Left, 5 reps, Seated   Assessment/Plan    PT Assessment Patient needs continued PT services  PT Problem List Decreased strength;Decreased range of motion;Decreased activity tolerance;Decreased balance;Decreased mobility       PT Treatment Interventions DME instruction;Gait training;Stair training;Functional mobility training;Therapeutic activities;Therapeutic exercise;Balance training;Neuromuscular re-education;Patient/family education    PT Goals (Current goals can be found in the Care Plan section)  Acute Rehab PT Goals Patient Stated Goal: to get a prosthetic device PT Goal Formulation: With patient/family Time For Goal Achievement: 12/06/23 Potential to Achieve Goals: Good    Frequency Min 3X/week        AM-PAC PT "6  Clicks" Mobility  Outcome Measure Help needed turning from your back to your side while in a flat bed without using bedrails?: A Little Help needed moving from lying on your back to sitting on the side of a flat bed without using bedrails?: A Little Help needed moving to and from a bed to a chair (including a wheelchair)?: A Little Help needed standing up from a chair using your arms (e.g., wheelchair or bedside chair)?: A Little Help needed to walk in hospital room?: A Lot Help needed climbing 3-5 steps with a railing? : Total 6 Click Score: 15    End of Session Equipment Utilized During Treatment: Gait belt Activity Tolerance: Patient tolerated treatment well Patient left: in bed;with call bell/phone within reach;with family/visitor present Nurse Communication: Mobility status PT Visit Diagnosis: Unsteadiness on feet (R26.81);Other abnormalities of gait and mobility (R26.89);Muscle weakness (generalized) (M62.81)    Time: 1332-1400 PT Time Calculation (min) (ACUTE ONLY): 28 min   Charges:   PT Evaluation $PT Eval Low Complexity: 1 Low PT Treatments $Gait Training: 8-22 mins PT General Charges $$ ACUTE PT VISIT: 1 Visit        Orysia Blas, PT, DPT Secure Chat Preferred  Rehab Office [phone]   Alissa April Adela Ades 11/22/2023, 2:11 PM

## 2023-11-22 NOTE — Progress Notes (Addendum)
 [facility]  Subjective  - The black sock was too tight, once it was removed he was able to rest last night.  Ace wrap maintained.   Objective 103/60 73 98.1 F (36.7 C) (Oral) 14 90%  Intake/Output Summary (Last 24 hours) at 11/22/2023 0746 Last data filed at 11/22/2023 0609 Gross per 24 hour  Intake 2269.65 ml  Output 1300 ml  Net 969.65 ml    Left BKA dressing in place with ace wrap Able to SLR, working on knee flexion don and doff SL brace. Lungs non labored breathing  Assessment/Planning: POD # 1 left BKA Plan for dressing change tomorrow.  Left black compression sock off.  Plan to reapply once dressing is changed.     Rocky Cipro 11/22/2023 7:46 AM --  Laboratory Lab Results: Recent Labs    11/21/23 1407 11/22/23 0346  WBC 9.3 8.3  HGB 11.3* 10.4*  HCT 33.0* 30.3*  PLT 182 164   BMET Recent Labs    11/21/23 1407 11/22/23 0346  NA  --  139  K  --  4.2  CL  --  107  CO2  --  27  GLUCOSE  --  152*  BUN  --  11  CREATININE 0.72 0.78  CALCIUM   --  8.0*    COAG Lab Results  Component Value Date   INR 1.1 11/13/2023   INR 1.0 07/29/2020   INR 1.06 02/28/2016   No results found for: "PTT"   I agree with the above.  Patient is medically stable for discharge and plan is for discharge to CIR  [facility] Jailen Coward

## 2023-11-22 NOTE — Evaluation (Signed)
 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 PMH: MI, CAD, anxiety, depression, HLD, PTSD, CVA.     Clinical Impressions Pt walked with a cane and was independent prior to admission. He lives with his wife who can provide limited physical assistance. Pt presents with significant post op pain and impaired standing balance. He was able to stand from elevated bed with CGA and transfer to chair with RW and min assist. He needs set up to min assist for ADLs. Pt has excellent potential to reach a modified independent level with intensive short term rehab. Patient will benefit from intensive inpatient follow-up therapy, >3 hours/day.     If plan is discharge home, recommend the following:   A little help with walking and/or transfers;A little help with bathing/dressing/bathroom;Assistance with cooking/housework;Assist for transportation;Help with stairs or ramp for entrance     Functional Status Assessment   Patient has had a recent decline in their functional status and demonstrates the ability to make significant improvements in function in a reasonable and predictable amount of time.     Equipment Recommendations    (RW)     Recommendations for Other Services   Rehab consult     Precautions/Restrictions   Precautions Precautions: Fall Recall of Precautions/Restrictions: Intact Required Braces or Orthoses: Other Brace Other Brace: L limb protector Restrictions Weight Bearing Restrictions Per Provider Order: Yes LLE Weight Bearing Per Provider Order: Non weight bearing     Mobility Bed Mobility Overal bed mobility: Modified Independent                  Transfers Overall transfer level: Needs assistance Equipment used: Rolling walker (2  wheels) Transfers: Sit to/from Stand, Bed to chair/wheelchair/BSC Sit to Stand: Contact guard assist, From elevated surface     Step pivot transfers: Min assist     General transfer comment: cues for hand placement, steadying assist      Balance Overall balance assessment: Needs assistance   Sitting balance-Leahy Scale: Good       Standing balance-Leahy Scale: Poor                             ADL either performed or assessed with clinical judgement   ADL Overall ADL's : Needs assistance/impaired Eating/Feeding: Independent;Sitting   Grooming: Set up;Sitting   Upper Body Bathing: Set up;Sitting   Lower Body Bathing: Minimal assistance;Sitting/lateral leans   Upper Body Dressing : Set up;Sitting   Lower Body Dressing: Minimal assistance;Bed level Lower Body Dressing Details (indicate cue type and reason): shorts Toilet Transfer: Minimal assistance;Stand-pivot;BSC/3in1   Toileting- Clothing Manipulation and Hygiene: Set up;Sitting/lateral lean               Vision Baseline Vision/History: 1 Wears glasses Ability to See in Adequate Light: 0 Adequate Patient Visual Report: No change from baseline       Perception         Praxis         Pertinent Vitals/Pain Pain Assessment Pain Assessment: Faces Faces Pain Scale: Hurts whole lot Pain Location: L LE Pain Descriptors / Indicators: Throbbing, Grimacing, Guarding Pain Intervention(s): Premedicated before session, Repositioned, Monitored during session     Extremity/Trunk Assessment Upper Extremity Assessment Upper Extremity Assessment: Overall [facility]  for tasks assessed   Lower Extremity Assessment Lower Extremity Assessment: Defer to PT evaluation   Cervical / Trunk Assessment Cervical / Trunk Assessment: Normal   Communication Communication Communication: Impaired Factors Affecting Communication: Hearing impaired   Cognition Arousal: Alert Behavior During Therapy: WFL for tasks  assessed/performed Cognition: Cognition impaired       Memory impairment (select all impairments): Short-term memory                       Following commands: Intact       Cueing  General Comments   Cueing Techniques: Verbal cues      Exercises     Shoulder Instructions      Home Living Family/patient expects to be discharged to:: Private residence Living Arrangements: Spouse/significant other Available Help at Discharge: Family;Available 24 hours/day;Other (Comment) (wife cannot provide physical assist) Type of Home: House Home Access: Stairs to enter;Other (comment) ([facility] is [facility]) Entrance Stairs-Number of Steps: 2   Home Layout: One level     Bathroom Shower/Tub: Walk-in shower;Tub/shower unit   Bathroom Toilet: Standard     Home Equipment: Rollator (4 wheels);Cane - single point;Shower seat;Electric scooter;Wheelchair - manual;Other (comment) ([facility] is getting [facility] a transport chair)          Prior Functioning/Environment Prior Level of Function : Independent/Modified Independent             Mobility Comments: walks with a cane      OT Problem List: Impaired balance (sitting and/or standing);Decreased knowledge of precautions;Decreased knowledge of use of DME or AE;Pain   OT Treatment/Interventions: Self-care/ADL training;DME and/or AE instruction;Therapeutic activities;Patient/family education;Balance training      OT Goals(Current goals can be found in the care plan section)   Acute Rehab OT Goals OT Goal Formulation: With patient Time For Goal Achievement: 12/06/23 Potential to Achieve Goals: Good ADL Goals Pt Will Perform Lower Body Bathing: with modified independence;sitting/lateral leans Pt Will Perform Lower Body Dressing: with modified independence;sitting/lateral leans Pt Will Transfer to Toilet: with modified independence;ambulating;bedside commode Pt Will Perform Toileting - Clothing Manipulation and hygiene:  with modified independence;sitting/lateral leans Pt Will Perform Tub/Shower Transfer: Shower transfer;with supervision;ambulating;shower seat;rolling walker   OT Frequency:  Min 2X/week    Co-evaluation              AM-PAC OT "6 Clicks" Daily Activity     Outcome Measure Help from another person eating meals?: None Help from another person taking care of personal grooming?: A Little Help from another person toileting, which includes using toliet, bedpan, or urinal?: A Little Help from another person bathing (including washing, rinsing, drying)?: A Little Help from another person to put on and taking off regular upper body clothing?: A Little Help from another person to put on and taking off regular lower body clothing?: A Little 6 Click Score: 19   End of Session Equipment Utilized During Treatment: Gait belt;Rolling walker (2 wheels) Nurse Communication: Mobility status  Activity Tolerance: Patient tolerated treatment well Patient left: in chair;with call bell/phone within reach;with chair alarm set  OT Visit Diagnosis: Unsteadiness on feet (R26.81);Pain                Time: 7829-5621 OT Time Calculation (min): 40 min Charges:  OT General Charges $OT Visit: 1 Visit OT Evaluation $OT Eval Moderate Complexity: 1 Mod OT Treatments $Self Care/Home Management : 23-37 mins  Avanell Leigh, OTR/L Acute Rehabilitation Services Office: [phone]  Jonette Nestle 11/22/2023,  10:32 AM

## 2023-11-22 NOTE — PMR Pre-admission (Shared)
 PMR Admission Coordinator Pre-Admission Assessment  Patient: [name] [name] is an 78 y.o., male MRN: [id number] DOB: 1945-11-01 Height: 5\' 9"  (175.3 cm) Weight: 90.9 kg  Insurance Information HMO:     PPO:      PCP:      IPA:      80/20:      OTHER:  PRIMARY: [facility]      Policy#: [id number]       Subscriber: Pt  CM Name: Hassie Lint      Phone#:  [phone] (check)     Fax#: [phone]  Pre-Cert#: [id number]     Received insurance authorization on 11/23/23. Employer:  Benefits:  Phone #:      Name:  Eff. Date: 12/04/2022     Deduct:       Out of Pocket Max:       Life Max:  CIR:       SNF:  Outpatient:      Co-Pay:  Home Health:       Co-Pay:  DME:      Co-Pay:  Providers: in-network SECONDARY: Medicare part A       Policy#: [id number]      Phone#:   Artist:       Phone#:   The Data processing manager" for patients in Inpatient Rehabilitation Facilities with attached "Privacy Act Statement-Health Care Records" was provided and verbally reviewed with: Patient  Emergency Contact Information Contact Information     Name Relation Home Work Mobile   [name] [name],Darlene Spouse [phone]  [phone]      Other Contacts     Name Relation Home Work Guy Daughter [phone]  [phone]       Current Medical History  Patient Admitting Diagnosis: BKA  History of Present Illness: [name] [name] is a 78 y.o. male with past medical history of MI, CAD, anxiety, depression, HLD, PTSD, CVA. He was admitted vascular service at [facility] on 11/21/23 due to non healing wounds and intractable pain. 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 Pt. Tolerated well and was seen by PT/OT post operatively. They recommended CIR to assist return to PLOF.      Patient's medical record from [facility] has been reviewed by the rehabilitation admission coordinator and physician.  Past Medical History  Past Medical History:   Diagnosis Date   Anxiety    Cancer (HCC)    skin cancer   Carpal tunnel syndrome    Coronary artery disease    DES x4   Depression    Hepatitis    Hepatitis A during [place] - received treatment   Hypercholesteremia    Hypertension    Mononeuropathy    Left Leg   Myocardial infarction (HCC) 2010ish   Peripheral vascular disease (HCC)    Pneumonia    PTSD (post-traumatic stress disorder)    PTSD (post-traumatic stress disorder)    Sleep apnea    doesnt use CPAP. Sinus surgery fixed OSA issues   Stroke (HCC)    slight weakness left- 85- 90 % returned    Has the patient had major surgery during 100 days prior to admission? No  Family History   family history includes Heart disease in his mother; Lung cancer in his father.  Current Medications  Current Facility-Administered Medications:    alum & mag hydroxide-simeth (MAALOX/MYLANTA) 200-200-20 MG/5ML suspension 15-30 mL, 15-30 mL, Oral, Q2H PRN, Rhyne, Samantha J, PA-C   aspirin  EC tablet 81 mg,  81 mg, Oral, Daily, Rhyne, Samantha J, PA-C, 81 mg at 11/24/23 4696   atorvastatin  (LIPITOR) tablet 80 mg, 80 mg, Oral, Daily, Rhyne, Samantha J, PA-C, 80 mg at 11/24/23 2952   bisacodyl  (DULCOLAX) suppository 10 mg, 10 mg, Rectal, Daily PRN, Rhyne, Samantha J, PA-C   buPROPion  (WELLBUTRIN  XL) 24 hr tablet 150 mg, 150 mg, Oral, Daily, Rhyne, Samantha J, PA-C, 150 mg at 11/24/23 8413   cyanocobalamin  (VITAMIN B12) tablet 500 mcg, 500 mcg, Oral, Daily, Rhyne, Samantha J, PA-C, 500 mcg at 11/24/23 2440   diphenhydrAMINE  (BENADRYL ) 25 mg in sodium chloride  0.9 % 50 mL IVPB, 25 mg, Intravenous, Q4H PRN, Hazeline Lister, Emma M, PA-C, Last Rate: 100 mL/hr at 11/22/23 1536, 25 mg at 11/22/23 1536   docusate sodium  (COLACE) capsule 100 mg, 100 mg, Oral, Daily, Rhyne, Samantha J, PA-C, 100 mg at 11/24/23 1027   DULoxetine  (CYMBALTA ) DR capsule 30 mg, 30 mg, Oral, Daily, Rhyne, Samantha J, PA-C, 30 mg at 11/24/23 0929   guaiFENesin -dextromethorphan   (ROBITUSSIN DM) 100-10 MG/5ML syrup 15 mL, 15 mL, Oral, Q4H PRN, Rhyne, Samantha J, PA-C   heparin  injection 5,000 Units, 5,000 Units, Subcutaneous, Q8H, Rhyne, Samantha J, PA-C, 5,000 Units at 11/24/23 0509   hydrALAZINE  (APRESOLINE ) injection 5 mg, 5 mg, Intravenous, Q20 Min PRN, Rhyne, Samantha J, PA-C   labetalol  (NORMODYNE ) injection 10 mg, 10 mg, Intravenous, Q10 min PRN, Rhyne, Samantha J, PA-C   magnesium  sulfate IVPB 2 g 50 mL, 2 g, Intravenous, Daily PRN, Rhyne, Samantha J, PA-C   metoprolol  tartrate (LOPRESSOR ) injection 2-5 mg, 2-5 mg, Intravenous, Q2H PRN, Rhyne, Samantha J, PA-C   morphine  (PF) 2 MG/ML injection 2 mg, 2 mg, Intravenous, Q2H PRN, Rhyne, Samantha J, PA-C, 2 mg at 11/24/23 2536   ondansetron  (ZOFRAN ) injection 4 mg, 4 mg, Intravenous, Q6H PRN, Rhyne, Samantha J, PA-C   oxyCODONE -acetaminophen  (PERCOCET/ROXICET) 5-325 MG per tablet 1-2 tablet, 1-2 tablet, Oral, Q4H PRN, Rhyne, Samantha J, PA-C, 2 tablet at 11/23/23 1640   pantoprazole  (PROTONIX ) EC tablet 40 mg, 40 mg, Oral, Daily, Rhyne, Samantha J, PA-C, 40 mg at 11/24/23 6440   phenol (CHLORASEPTIC) mouth spray 1 spray, 1 spray, Mouth/Throat, PRN, Rhyne, Samantha J, PA-C   polyethylene glycol (MIRALAX  / GLYCOLAX ) packet 17 g, 17 g, Oral, Daily PRN, Rhyne, Samantha J, PA-C   polyvinyl alcohol  (LIQUIFILM TEARS) 1.4 % ophthalmic solution 1 drop, 1 drop, Both Eyes, TID PRN, Rhyne, Samantha J, PA-C   potassium chloride  SA (KLOR-CON  M) CR tablet 20-40 mEq, 20-40 mEq, Oral, Daily PRN, Rhyne, Samantha J, PA-C   tamsulosin  (FLOMAX ) capsule 0.4 mg, 0.4 mg, Oral, Daily, Rhyne, Samantha J, PA-C, 0.4 mg at 11/24/23 3474  Patients Current Diet:  Diet Order             Diet Heart Room service appropriate? Yes; Fluid consistency: Thin  Diet effective now                   Precautions / Restrictions Precautions Precautions: Fall Other Brace: L limb protector Restrictions Weight Bearing Restrictions Per Provider Order:  Yes LLE Weight Bearing Per Provider Order: Non weight bearing (bka)   Has the patient had 2 or more falls or a fall with injury in the past year? Yes  Prior Activity Level Community (5-7x/wk): Pt. active in the community PTA  Prior Functional Level Self Care: Did the patient need help bathing, dressing, using the toilet or eating? Independent  Indoor Mobility: Did the patient need assistance with walking  from room to room (with or without device)? Independent  Stairs: Did the patient need assistance with internal or external stairs (with or without device)? Independent  Functional Cognition: Did the patient need help planning regular tasks such as shopping or remembering to take medications? Independent  Patient Information Are you of Hispanic, Latino/a,or Spanish origin?: A. No, not of Hispanic, Latino/a, or Spanish origin What is your race?: A. White Do you need or want an interpreter to communicate with a doctor or health care staff?: 0. No  Patient's Response To:  Health Literacy and Transportation Is the patient able to respond to health literacy and transportation needs?: Yes Health Literacy - How often do you need to have someone help you when you read instructions, pamphlets, or other written material from your doctor or pharmacy?: Never In the past 12 months, has lack of transportation kept you from medical appointments or from getting medications?: No In the past 12 months, has lack of transportation kept you from meetings, work, or from getting things needed for daily living?: No  Journalist, newspaper / Equipment Home Equipment: Occupational hygienist (4 wheels), [employer] - single point, Information systems manager, Art gallery manager, Wheelchair - manual, Other (comment) ([facility] is [facility] transport chair)  Prior Device Use: Indicate devices/aids used by the patient prior to current illness, exacerbation or injury? None of the above  Current Functional Level Cognition  Orientation Level: Oriented X4     Extremity Assessment (includes Sensation/Coordination)  Upper Extremity Assessment: Defer to OT evaluation  Lower Extremity Assessment: LLE deficits/detail LLE Deficits / Details: Limited knee flexion ROM. Able to SLR and activate quad LLE Sensation: WNL    ADLs  Overall ADL's : Needs assistance/impaired Eating/Feeding: Independent, Sitting Grooming: Set up, Sitting Upper Body Bathing: Set up, Sitting Lower Body Bathing: Minimal assistance, Sitting/lateral leans Upper Body Dressing : Set up, Sitting Lower Body Dressing: Minimal assistance, Bed level Lower Body Dressing Details (indicate cue type and reason): shorts Toilet Transfer: Minimal assistance, Stand-pivot, BSC/3in1 Toileting- Clothing Manipulation and Hygiene: Set up, Sitting/lateral lean    Mobility  Overal bed mobility: Modified Independent General bed mobility comments: + use of bed rails and HOB elevated    Transfers  Overall transfer level: Needs assistance Equipment used: Rolling walker (2 wheels) Transfers: Sit to/from Stand Sit to Stand: Contact guard assist Bed to/from chair/wheelchair/BSC transfer type:: Step pivot Step pivot transfers: Min assist General transfer comment: CGA for safety, good recall for hand placement    Ambulation / Gait / Stairs / Wheelchair Mobility  Ambulation/Gait Ambulation/Gait assistance: Editor, commissioning (Feet): 24 Feet Assistive device: Rolling walker (2 wheels) Gait Pattern/deviations:  (hop to) General Gait Details: min A to maintain balanace especailly wih last ~5' as pt with flexed RLE in stance due to fatigue, cues to keep RW in contact with floor Gait velocity: decr    Posture / Balance Balance Overall balance assessment: Needs assistance Sitting-balance support: No upper extremity supported Sitting balance-Leahy Scale: Good Standing balance support: Bilateral upper extremity supported, During functional activity, Reliant on assistive device for  balance Standing balance-Leahy Scale: Poor Standing balance comment: reliant on UE support    Special needs/care consideration Skin surgical site   Previous Home Environment (from acute therapy documentation) Living Arrangements: Spouse/significant other Available Help at Discharge: Family, Available 24 hours/day, Other (Comment) (wife cannot provide physical assist) Type of Home: House Home Layout: One level Home Access: Stairs to enter, Other (comment) ([facility] is measuring for a ramp) Entrance Stairs-Number of Steps:  2 Bathroom Shower/Tub: Psychologist, counselling, Engineer, manufacturing systems: Standard Bathroom Accessibility: Yes How Accessible: Accessible via walker, Accessible via wheelchair Home Care Services: No  Discharge Living Setting Plans for Discharge Living Setting: Patient's home, House Type of Home at Discharge: House Discharge Home Layout: One level Discharge Home Access: Stairs to enter Entrance Stairs-Rails: Right, Left Entrance Stairs-Number of Steps: 2 Discharge Bathroom Shower/Tub: Walk-in shower Discharge Bathroom Toilet: Standard Discharge Bathroom Accessibility: Yes How Accessible: Accessible via walker, Accessible via wheelchair Does the patient have any problems obtaining your medications?: No  Social/Family/Support Systems Patient Roles: Spouse Contact Information: [phone] Anticipated Caregiver: Darlene Anticipated Caregiver's Contact Information: Min A Ability/Limitations of Caregiver: 24/7 Caregiver Availability: 24/7 Discharge Plan Discussed with Primary Caregiver: Yes Is Caregiver In Agreement with Plan?: Yes Does Caregiver/Family have Issues with Lodging/Transportation while Pt is in Rehab?: Yes  Goals Patient/Family Goal for Rehab: PT/OT Supervision Expected length of stay: 10-12 days Pt/Family Agrees to Admission and willing to participate: Yes Program Orientation Provided & Reviewed with Pt/Caregiver Including Roles  & Responsibilities:  Yes  Decrease burden of Care through IP rehab admission: not anticipated  Possible need for SNF placement upon discharge: not anticipated  Patient Condition: not anticipated  Preadmission Screen Completed By:  Dorena Gander, 11/24/2023 9:37 AM ______________________________________________________________________   Discussed status with Engler on 11/24/23  at 9:37 AM and received approval for admission today.  Admission Coordinator:  Dorena Gander, CCC-SLP, time 9:37 AM/Date 11/24/23    Assessment/Plan: Diagnosis: *** Does the need for close, 24 hr/day Medical supervision in concert with the patient's rehab needs make it unreasonable for this patient to be served in a less intensive setting? {yes_no_potentially:3041433} Co-Morbidities requiring supervision/potential complications: *** Due to {due WG:9562130}, does the patient require 24 hr/day rehab nursing? {yes_no_potentially:3041433} Does the patient require coordinated care of a physician, rehab nurse, PT, OT, and SLP to address physical and functional deficits in the context of the above medical diagnosis(es)? {yes_no_potentially:3041433} Addressing deficits in the following areas: {deficits:3041436} Can the patient actively participate in an intensive therapy program of at least 3 hrs of therapy 5 days a week? {yes_no_potentially:3041433} The potential for patient to make measurable gains while on inpatient rehab is {potential:3041437} Anticipated functional outcomes upon discharge from inpatient rehab: {functional outcomes:304600100} PT, {functional outcomes:304600100} OT, {functional outcomes:304600100} SLP Estimated rehab length of stay to reach the above functional goals is: *** Anticipated discharge destination: {anticipated dc setting:21604} 10. Overall Rehab/Functional Prognosis: {potential:3041437}   MD Signature: ***

## 2023-11-23 LAB — CBC
HCT: 30.8 % — ABNORMAL LOW (ref 39.0–52.0)
Hemoglobin: 10.7 g/dL — ABNORMAL LOW (ref 13.0–17.0)
MCH: 31.3 pg (ref 26.0–34.0)
MCHC: 34.7 g/dL (ref 30.0–36.0)
MCV: 90.1 fL (ref 80.0–100.0)
Platelets: 157 10*3/uL (ref 150–400)
RBC: 3.42 MIL/uL — ABNORMAL LOW (ref 4.22–5.81)
RDW: 11.9 % (ref 11.5–15.5)
WBC: 9.4 10*3/uL (ref 4.0–10.5)
nRBC: 0 % (ref 0.0–0.2)

## 2023-11-23 LAB — BASIC METABOLIC PANEL WITH GFR
Anion gap: 7 (ref 5–15)
BUN: 11 mg/dL (ref 8–23)
CO2: 27 mmol/L (ref 22–32)
Calcium: 8 mg/dL — ABNORMAL LOW (ref 8.9–10.3)
Chloride: 99 mmol/L (ref 98–111)
Creatinine, Ser: 0.66 mg/dL (ref 0.61–1.24)
GFR, Estimated: >60 mL/min (ref >60)
Glucose, Bld: 134 mg/dL — ABNORMAL HIGH (ref 70–99)
Potassium: 3.9 mmol/L (ref 3.5–5.1)
Sodium: 133 mmol/L — ABNORMAL LOW (ref 135–145)

## 2023-11-23 NOTE — H&P (Signed)
 Physical Medicine and Rehabilitation Admission H&P    CC: Functional deficits due to BKA.   HPI: [name] [name] is a 78 y.o. male with a PMHx of HTN, HLD, depression/anxiety, CAD s/p PCI x4 ([facility]), PTSD,  CVA w/residual L weakness, agent orange exposure, OSA s/p nasal surgeries, HOH, lumbar decompression in the past, mononeuropathy LLE with severe pain and blistering having failed multiple medical interventions and underwent L- BKA by Dr. Charlotte Cookey on 11/21/23. Post op ABLA noted with Hgb drop from 13.7 to 10.7. Therapy has been working with patient who requires CGA to min assist with ADLs and mobility. He was independent PTA admission and CIR recommended due to functional decline.    Review of Systems  Constitutional: Negative.   HENT: Negative.    Eyes: Negative.   Respiratory: Negative.    Cardiovascular: Negative.   Gastrointestinal: Negative.   Genitourinary: Negative.   Musculoskeletal:  Positive for joint pain and myalgias.  Skin: Negative.   Neurological:  Positive for sensory change and weakness.  Psychiatric/Behavioral:  The patient is not nervous/anxious.      Past Medical History:  Diagnosis Date   Anxiety    Cancer (HCC)    skin cancer   Carpal tunnel syndrome    Coronary artery disease    DES x4   Depression    Hepatitis    Hepatitis A during [place] - received treatment   Hypercholesteremia    Hypertension    Mononeuropathy    Left Leg   Myocardial infarction (HCC) 2010ish   Peripheral vascular disease (HCC)    Pneumonia    PTSD (post-traumatic stress disorder)    PTSD (post-traumatic stress disorder)    Sleep apnea    doesnt use CPAP. Sinus surgery fixed OSA issues   Stroke (HCC)    slight weakness left- 85- 90 % returned    Past Surgical History:  Procedure Laterality Date   AMPUTATION Left 11/21/2023   Procedure: AMPUTATION BELOW KNEE;  Surgeon: Margherita Shell, MD;  Location: [facility] OR;  Service: Vascular;  Laterality:  Left;  block   CARDIAC CATHETERIZATION  11/12/2021   DES stent placed   CARPAL TUNNEL RELEASE Right 06/10/2013   Procedure: RIGHT CARPAL TUNNEL RELEASE;  Surgeon: Arnie Lao, MD;  Location: [facility] OR;  Service: Orthopedics;  Laterality: Right;   CARPAL TUNNEL RELEASE Left    CATARACT EXTRACTION Bilateral    COLONOSCOPY     CORONARY ANGIOPLASTY     EXCISION HAGLUND'S DEFORMITY WITH ACHILLES TENDON REPAIR Right 06/24/2021   Procedure: RIGHT HAGLUNDS RESECTION;  Surgeon: Timothy Ford, MD;  Location[facility] OR;  Service: Orthopedics;  Laterality: Right;   KNEE ARTHROSCOPY Right    KNEE ARTHROSCOPY Left 08/04/2019   Procedure: LEFT KNEE ARTHROSCOPY WITH PARTIAL MEDIAL MENISCECTOMY;  Surgeon: Arnie Lao, MD;  Location: [facility];  Service: Orthopedics;  Laterality: Left;   LUMBAR SPINE SURGERY     x2   NASAL SINUS SURGERY     stents  2012   TONSILLECTOMY      Family History  Problem Relation Age of Onset   Heart disease Mother        Several other maternal family members   Lung cancer Father        small cell CA/smoked    Social History: Married. Wife cannot provide physical assistance. He  reports that he quit smoking about 36 years ago. His smoking use included cigarettes. He started smoking  about 56 years ago. He has a 40 pack-year smoking history. He has never used smokeless tobacco. He reports that he does not drink alcohol  and does not use drugs.   Allergies  Allergen Reactions   Dilaudid [Hydromorphone Hcl] Other (See Comments)    "CRASHES" per patient Talked to nurse and she said pt is afraid to take dilaudid because pt states he flat-lined when they gave it to him.Aaron AasAaron AasPt doesn't mind taking Percocet    Clindamycin/Lincomycin Other (See Comments)    Blisters in mouth   Oxcarbazepine Other (See Comments)    Other reaction(s): Delirium    Medications Prior to Admission  Medication Sig Dispense Refill   aspirin  EC 81 MG tablet Take 81 mg by  mouth daily.     atorvastatin  (LIPITOR) 80 MG tablet Take 80 mg by mouth daily.     buPROPion  (WELLBUTRIN  XL) 150 MG 24 hr tablet Take 150 mg by mouth daily.     Cholecalciferol 50 MCG (2000 UT) TABS Take 1 tablet by mouth daily.     cyanocobalamin  (VITAMIN B12) 500 MCG tablet Take 500 mcg by mouth daily.     DULoxetine  (CYMBALTA ) 30 MG capsule Take 30 mg by mouth daily.     omeprazole (PRILOSEC) 20 MG capsule Take 20 mg by mouth daily.     tamsulosin  (FLOMAX ) 0.4 MG CAPS capsule Take 0.4 mg by mouth daily.     buPROPion  (WELLBUTRIN  SR) 150 MG 12 hr tablet Take 150 mg by mouth daily. (Patient not taking: Reported on 11/08/2023)     carboxymethylcellulose (REFRESH PLUS) 0.5 % SOLN Place 1 drop into both eyes 3 (three) times daily as needed (dry eyes).     nitroGLYCERIN  (NITRODUR - DOSED IN MG/24 HR) 0.2 mg/hr patch Place 1 patch (0.2 mg total) onto the skin daily. (Patient not taking: Reported on 11/08/2023) 30 patch 12   predniSONE  (DELTASONE ) 50 MG tablet Take one tablet by mouth once daily for 5 days. (Patient not taking: Reported on 11/08/2023) 5 tablet 0    Home: Home Living Family/patient expects to be discharged to:: Private residence Living Arrangements: Spouse/significant other Available Help at Discharge: Family, Available 24 hours/day, Other (Comment) (wife cannot provide physical assist) Type of Home: House Home Access: Stairs to enter, Other (comment) ([facility] ramp) Entrance Stairs-Number of Steps: 2 Home Layout: One level Bathroom Shower/Tub: Psychologist, counselling, Engineer, manufacturing systems: Standard Bathroom Accessibility: Yes Home Equipment: Rollator (4 wheels), Cane - single point, Information systems manager, Art gallery manager, Wheelchair - manual, Other (comment) ([facility] is getting [facility] a transport chair)   Functional History: Prior Function Prior Level of Function : Independent/Modified Independent Mobility Comments: walks with a cane  Functional Status:  Mobility: Bed  Mobility Overal bed mobility: Modified Independent General bed mobility comments: use of rails and bed features Transfers Overall transfer level: Needs assistance Equipment used: Rolling walker (2 wheels) Transfers: Sit to/from Stand Sit to Stand: Min assist, Contact guard assist Bed to/from chair/wheelchair/BSC transfer type:: Step pivot Step pivot transfers: Min assist General transfer comment: min A to steady on initial rise, able to complete x3 from chair with CGA for safety and light cues for hand placement Ambulation/Gait Ambulation/Gait assistance: Min assist Gait Distance (Feet): 30 Feet Assistive device: Rolling walker (2 wheels) Gait Pattern/deviations:  (hop to) General Gait Details: min A to maintain balance, cues for pacing with x1 standing rest break needed for UE fatigue. Gait velocity: decr    ADL: ADL Overall ADL's : Needs assistance/impaired  Eating/Feeding: Independent, Sitting Grooming: Set up, Sitting Upper Body Bathing: Set up, Sitting Lower Body Bathing: Minimal assistance, Sitting/lateral leans Upper Body Dressing : Set up, Sitting Lower Body Dressing: Minimal assistance, Sitting/lateral leans Lower Body Dressing Details (indicate cue type and reason): sitting EOB, pt donned right non slip sock without assistance. Pt educated on donning technique for limb protector with hands on practice. OT provided stability of protector only while pt managed straps. Toilet Transfer: Minimal assistance, Stand-pivot, BSC/3in1 Toileting- Clothing Manipulation and Hygiene: Set up, Sitting/lateral lean  Cognition: Cognition Orientation Level: Oriented X4 Cognition Arousal: Alert Behavior During Therapy: WFL for tasks assessed/performed   Blood pressure 135/72, pulse 82, temperature 98.7 F (37.1 C), temperature source Oral, resp. rate 20, height 5\' 9"  (1.753 m), weight 90.9 kg, SpO2 97%. Physical Exam Vitals and nursing note reviewed.  Constitutional:       Appearance: Normal appearance.  HENT:     Head: Normocephalic.     Right Ear: External ear normal.     Left Ear: External ear normal.     Nose: Nose normal.     Mouth/Throat:     Mouth: Mucous membranes are moist.  Eyes:     Extraocular Movements: Extraocular movements intact.     Pupils: Pupils are equal, round, and reactive to light.  Cardiovascular:     Rate and Rhythm: Normal rate and regular rhythm.     Pulses: Normal pulses.  Pulmonary:     Effort: Pulmonary effort is normal. No respiratory distress.     Breath sounds: No wheezing or rales.  Abdominal:     General: There is no distension.     Palpations: Abdomen is soft.     Tenderness: There is no abdominal tenderness.  Musculoskeletal:     Cervical back: Normal range of motion.     Comments: Right BKA still swollen and tender.  Skin:    Comments: BKA with staples intact, warm, erythema and taut with some bleeding from an area along incision line laterally. Erythema extends medially to upper thigh which is also warm to touch.   Neurological:     Mental Status: He is alert.     Comments: Alert and oriented x 3. Normal insight and awareness. Intact Memory. Normal language and speech. Cranial nerve exam unremarkable. MMT: BUE 5/5 prox to distal. LLE 3/5 HF and KE. RLE 4/5 prox to distal. Sensory exam normal for light touch and pain in all 4 limbs. No limb ataxia or cerebellar signs. No abnormal tone appreciated.     Psychiatric:        Mood and Affect: Mood normal.        Behavior: Behavior normal.      No results found for this or any previous visit (from the past 48 hours).  No results found.    Blood pressure 135/72, pulse 82, temperature 98.7 F (37.1 C), temperature source Oral, resp. rate 20, height 5\' 9"  (1.753 m), weight 90.9 kg, SpO2 97%.  Medical Problem List and Plan: 1. Functional deficits secondary to PAD and left BKA  -patient may shower if left BKA site is covered  -ELOS/Goals: 10-12 days with  supervision goals for PT and OT  -Limb guard when OOB 2.  Antithrombotics: -DVT/anticoagulation:  Pharmaceutical: Heparin  5000U q8h  -antiplatelet therapy: ASA 81mg  daily 3. Pain Management: robaxin PRN. Oxy-APAP 5-325mg  1-2 tabs q4h PRN -Hasn't been using much morphine  in the last 24hrs, d/c for now, PRN POs 4. Mood/Behavior/Sleep/hx of PTSD and depression/anxiety: LCSW to  follow for evaluation and support.   -antipsychotic agents: N/A  -Continue Bupropion  150mg  XR daily, Duloxetine  30mg  daily. Also has service dog.   -Melatonin 5mg  PRN 5. Neuropsych/cognition: This patient is capable of making decisions on his own behalf. 6. Skin/Wound Care:  Routine pressure relief measures.  7. Fluids/Electrolytes/Nutrition: Monitor I&O, routine labs. Continue supplements and vitamins.  8. HTN: home meds Lisinopril  10mg  daily  -BPs have been fine postop, monitor with increased mobility 9. Cellulitis?: Residual limb edematous and tight with erythema extendin medially to mid thigh. Keflex initiated by PA for wound prophylaxis.   -will continue for now   10. CAD s/p PCI: Monitor for symptoms with increase in activity  11. 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 Constipation: Senna added  13.HLD: continue Atorvastatin  80mg  daily 14. BPH: continue Flomax  0.4mg  daily 15. ABLA: Monitor for signs of bleeding. Recheck CBC in am.  16. Hyponatremia: Recheck BMET in am.  17. GERD/GI ppx: Managed with Protonix  40mg  daily   Zelda Hickman, PA-C 11/26/2023

## 2023-11-23 NOTE — Progress Notes (Signed)
 Inpatient Rehab Admissions Coordinator:  Received insurance authorization. Await bed availability.    Artemus Larsen, MS, CCC-SLP Admissions Coordinator [phone]

## 2023-11-23 NOTE — Progress Notes (Addendum)
 [facility]  Subjective  - No new complaints   Objective 126/76 87 98.7 F (37.1 C) (Oral) 14 96%  Intake/Output Summary (Last 24 hours) at 11/23/2023 0719 Last data filed at 11/23/2023 0534 Gross per 24 hour  Intake 1528.83 ml  Output 1450 ml  Net 78.83 ml       Incision healing well without signs of ischemia or infection.  No drainage or erythema Motor and sensation intact Placed 4 x 4 in black compression sock and doned the left BKA sock  Assessment/Planning: POD #2   Encourage mobility Don limb guard when moving out of bed Pending CIR for rehab Benadryl  for itching PRN Cont ASA and Statin daily  Rocky Cipro 11/23/2023 7:19 AM --  Laboratory Lab Results: Recent Labs    11/22/23 0346 11/23/23 0314  WBC 8.3 9.4  HGB 10.4* 10.7*  HCT 30.3* 30.8*  PLT 164 157   BMET Recent Labs    11/22/23 0346 11/23/23 0314  NA 139 133*  K 4.2 3.9  CL 107 99  CO2 27 27  GLUCOSE 152* 134*  BUN 11 11  CREATININE 0.78 0.66  CALCIUM  8.0* 8.0*    COAG Lab Results  Component Value Date   INR 1.1 11/13/2023   INR 1.0 07/29/2020   INR 1.06 02/28/2016   No results found for: "PTT"  I agree with the above.  I have seen and evaluated the patient.  He has been approved for rehab and will likely go on Saturday  [facility]

## 2023-11-23 NOTE — Care Management Important Message (Signed)
 Important Message  Patient Details  Name: [name] [name] MRN: [id number] Date of Birth: 1945-12-28   Important Message Given:  Yes - Medicare IM     Janith Melnick 11/23/2023, 10:25 AM

## 2023-11-23 NOTE — Progress Notes (Signed)
 Physical Therapy Treatment Patient Details Name: [name] [name] MRN: [id number] DOB: [birth year] Today's Date: 11/23/2023   History of Present Illness Pt is a 78 year old man admitted on 5/28 for L BKA due to non healing wounds and intractable pain. PMH: MI, CAD, anxiety, depression, HLD, PTSD, CVA.    PT Comments  Pt resting in bed on arrival, pleasant and agreeable to session with good progress towards acute mobiilty goals. Pt demonstrating increased activity tolerance and UE strength, progressing gait distance this session with RW for support and grossly min A. Pt continues to fatigue quickly with hop-to pattern during gait with increased R knee flexion in stance and lower clearance of RLE last ~5'. Pt educated on continued importance of L knee extension in supine with pt verbalizing understanding. Pt continues to benefit from skilled PT services to progress toward functional mobility goals.      If plan is discharge home, recommend the following: A little help with walking and/or transfers;A little help with bathing/dressing/bathroom;Assist for transportation;Help with stairs or ramp for entrance;Assistance with cooking/housework   Can travel by Training and development officer (2 wheels)    Recommendations for Other Services       Precautions / Restrictions Precautions Precautions: Fall Recall of Precautions/Restrictions: Intact Required Braces or Orthoses: Other Brace Other Brace: L limb protector Restrictions Weight Bearing Restrictions Per Provider Order: Yes LLE Weight Bearing Per Provider Order: Non weight bearing (BKA)     Mobility  Bed Mobility Overal bed mobility: Modified Independent             General bed mobility comments: + use of bed rails and HOB elevated    Transfers Overall transfer level: Needs assistance Equipment used: Rolling walker (2 wheels) Transfers: Sit to/from Stand Sit to Stand: Contact guard assist            General transfer comment: CGA for safety, good recall for hand placement    Ambulation/Gait Ambulation/Gait assistance: Min assist Gait Distance (Feet): 24 Feet Assistive device: Rolling walker (2 wheels) Gait Pattern/deviations:  (hop to) Gait velocity: decr     General Gait Details: min A to maintain balanace especailly wih last ~5' as pt with flexed RLE in stance due to fatigue, cues to keep RW in contact with floor   Stairs             Wheelchair Mobility     Tilt Bed    Modified Rankin (Stroke Patients Only)       Balance Overall balance assessment: Needs assistance Sitting-balance support: No upper extremity supported Sitting balance-Leahy Scale: Good     Standing balance support: Bilateral upper extremity supported, During functional activity, Reliant on assistive device for balance Standing balance-Leahy Scale: Poor Standing balance comment: reliant on UE support                            Communication Communication Communication: Impaired Factors Affecting Communication: Hearing impaired  Cognition Arousal: Alert Behavior During Therapy: WFL for tasks assessed/performed   PT - Cognitive impairments: No apparent impairments                       PT - Cognition Comments: slightly sleepy at end of sesion, suspect due to pain meds Following commands: Intact      Cueing Cueing Techniques: Verbal cues  Exercises Amputee Exercises Quad Sets: AROM, Both, Left,  5 reps, Supine Gluteal Sets: AROM, 5 reps Hip ABduction/ADduction: AROM, Left, 5 reps, Seated    General Comments        Pertinent Vitals/Pain Pain Assessment Pain Assessment: Faces Faces Pain Scale: Hurts a little bit Pain Location: L LE Pain Descriptors / Indicators: Throbbing, Grimacing, Guarding Pain Intervention(s): Premedicated before session, Monitored during session, Limited activity within patient's tolerance    Home Living                           Prior Function            PT Goals (current goals can now be found in the care plan section) Acute Rehab PT Goals Patient Stated Goal: to get a prosthetic device PT Goal Formulation: With patient/family Time For Goal Achievement: 12/06/23 Progress towards PT goals: Progressing toward goals    Frequency    Min 3X/week      PT Plan      Co-evaluation              AM-PAC PT "6 Clicks" Mobility   Outcome Measure  Help needed turning from your back to your side while in a flat bed without using bedrails?: A Little Help needed moving from lying on your back to sitting on the side of a flat bed without using bedrails?: A Little Help needed moving to and from a bed to a chair (including a wheelchair)?: A Little Help needed standing up from a chair using your arms (e.g., wheelchair or bedside chair)?: A Little Help needed to walk in hospital room?: A Lot Help needed climbing 3-5 steps with a railing? : Total 6 Click Score: 15    End of Session Equipment Utilized During Treatment: Gait belt Activity Tolerance: Patient tolerated treatment well Patient left: in bed;with call bell/phone within reach;with family/visitor present Nurse Communication: Mobility status PT Visit Diagnosis: Unsteadiness on feet (R26.81);Other abnormalities of gait and mobility (R26.89);Muscle weakness (generalized) (M62.81)     Time: 4098-1191 PT Time Calculation (min) (ACUTE ONLY): 24 min  Charges:    $Gait Training: 8-22 mins $Therapeutic Exercise: 8-22 mins PT General Charges $$ ACUTE PT VISIT: 1 Visit                     [name] [name] R. PTA Acute Rehabilitation Services Office: [phone]   [name] [name] 11/23/2023, 12:01 PM

## 2023-11-24 NOTE — Progress Notes (Signed)
 Inpatient Rehab Admissions Coordinator:  Admission to CIR today being held due to staffing. Will continue to follow.  Artemus Larsen, MS, CCC-SLP Admissions Coordinator [phone]

## 2023-11-24 NOTE — Progress Notes (Addendum)
 [facility]  Subjective  - doing better and pain is better in the left LE   Objective 125/64 92 99.7 F (37.6 C) (Oral) 17 93%  Intake/Output Summary (Last 24 hours) at 11/24/2023 0725 Last data filed at 11/24/2023 0600 Gross per 24 hour  Intake 300 ml  Output 1200 ml  Net -900 ml   Incision healing well without signs of ischemia or infection.  No drainage or erythema Motor and sensation intact.  Improved active knee range of motion since yesterday. Placed 4 x 4 in black compression sock and doned the left BKA sock   Assessment/Planning: S/P left BKA this was done for uncontrolled left LE pain  Well healing incision with intact staples.   He will be discharged to CIR today for rehab. F/U in our office will be arrange for 4 weeks.  Rocky Cipro 11/24/2023 7:25 AM --  Laboratory Lab Results: Recent Labs    11/22/23 0346 11/23/23 0314  WBC 8.3 9.4  HGB 10.4* 10.7*  HCT 30.3* 30.8*  PLT 164 157   BMET Recent Labs    11/22/23 0346 11/23/23 0314  NA 139 133*  K 4.2 3.9  CL 107 99  CO2 27 27  GLUCOSE 152* 134*  BUN 11 11  CREATININE 0.78 0.66  CALCIUM  8.0* 8.0*    COAG Lab Results  Component Value Date   INR 1.1 11/13/2023   INR 1.0 07/29/2020   INR 1.06 02/28/2016   No results found for: "PTT"   I have seen and evaluated the patient. I agree with the PA note as documented above.  Status post left BKA.  Okay for CIR.  Young Hensen, MD [facility] [place] Office: [phone]

## 2023-11-24 NOTE — TOC Progression Note (Signed)
 Transition of Care [facility]) - Progression Note    Patient Details  Name: [name] [name] MRN: [id number] Date of Birth: Jun 29, 1945  Transition of Care [facility]) CM/SW Contact  Arron Big, [place] Phone Number: 11/24/2023, 10:34 AM  Clinical Narrative:   Per CIR, Pt will not be able to admit this weekend due to staffing.   TOC will continue to follow.        Expected Discharge Plan and Services         Expected Discharge Date: 11/24/23                                     Social Determinants of Health (SDOH) Interventions SDOH Screenings   Food Insecurity: No Food Insecurity (11/21/2023)  Housing: Low Risk  (11/21/2023)  Transportation Needs: No Transportation Needs (11/21/2023)  Utilities: Not At Risk (11/21/2023)  Financial Resource Strain: Low Risk  (08/25/2022)   Received from [facility]  Social Connections: Moderately Isolated (11/21/2023)  Stress: No Stress Concern Present (08/25/2022)   Received from [facility]  Tobacco Use: Medium Risk (11/21/2023)    Readmission Risk Interventions     No data to display

## 2023-11-24 NOTE — PMR Pre-admission (Signed)
 PMR Admission Coordinator Pre-Admission Assessment  Patient: [name] [name] is an 78 y.o., male MRN: [id number] DOB: [birth year] Height: 5\' 9"  (175.3 cm) Weight: 90.9 kg  Insurance Information HMO:     PPO:      PCP:      IPA:      80/20:      OTHER:  PRIMARY: [facility]      Policy#: [id number]      Subscriber: patient CM Name: Cassie      Phone#: [phone], email: [email or web address]     Fax#: [phone] Pre-Cert#: [id number]      Employer:  Benefits:  Phone #: [phone]     Name:  Eff. Date: 12/04/22     Deduct:       Out of Pocket Max:       Life Max:  CIR: per [facility]      SNF:  Outpatient:      Co-Pay:  Home Health:       Co-Pay:  DME:      Co-Pay:  Providers: in-network SECONDARY: Medicare Part A      Policy#: [id number]     Phone#:   Artist:       Phone#:   The "Data Collection Information Summary" for patients in Inpatient Rehabilitation Facilities with attached "Privacy Act Statement-Health Care Records" was provided and verbally reviewed with: Patient  Emergency Contact Information Contact Information     Name Relation Home Work Mobile   [name] [name] Spouse [phone]  [phone]      Other Contacts     Name Relation Home Work [name] [name] Daughter [phone]  [phone]       Current Medical History  Patient Admitting Diagnosis: Left BKA  History of Present Illness: [name] [name] is a 78 y.o. male with past medical history of MI, CAD, anxiety, depression, HLD, PTSD, CVA. He was admitted vascular service at [facility] on 11/21/23 due to non healing wounds and intractable pain. 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 Pt. Tolerated well and was seen by PT/OT post operatively. They recommended CIR to assist return to PLOF     Patient's medical record from [facility] has been reviewed by the rehabilitation admission coordinator and physician.  Past Medical History  Past Medical History:   Diagnosis Date   Anxiety    Cancer (HCC)    skin cancer   Carpal tunnel syndrome    Coronary artery disease    DES x4   Depression    Hepatitis    Hepatitis A during [place] - received treatment   Hypercholesteremia    Hypertension    Mononeuropathy    Left Leg   Myocardial infarction (HCC) 2010ish   Peripheral vascular disease (HCC)    Pneumonia    PTSD (post-traumatic stress disorder)    PTSD (post-traumatic stress disorder)    Sleep apnea    doesnt use CPAP. Sinus surgery fixed OSA issues   Stroke (HCC)    slight weakness left- 85- 90 % returned    Has the patient had major surgery during 100 days prior to admission? No  Family History   family history includes Heart disease in his mother; Lung cancer in his father.  Current Medications  Current Facility-Administered Medications:    alum & mag hydroxide-simeth (MAALOX/MYLANTA) 200-200-20 MG/5ML suspension 15-30 mL, 15-30 mL, Oral, Q2H PRN, Rhyne, Samantha J, PA-C   aspirin  EC tablet 81 mg, 81 mg, Oral, Daily, Rhyne, Samantha J, PA-C,  81 mg at 11/24/23 1610   atorvastatin  (LIPITOR) tablet 80 mg, 80 mg, Oral, Daily, Rhyne, Samantha J, PA-C, 80 mg at 11/24/23 9604   bisacodyl  (DULCOLAX) suppository 10 mg, 10 mg, Rectal, Daily PRN, Rhyne, Samantha J, PA-C   buPROPion  (WELLBUTRIN  XL) 24 hr tablet 150 mg, 150 mg, Oral, Daily, Rhyne, Samantha J, PA-C, 150 mg at 11/24/23 5409   cyanocobalamin  (VITAMIN B12) tablet 500 mcg, 500 mcg, Oral, Daily, Rhyne, Samantha J, PA-C, 500 mcg at 11/24/23 8119   diphenhydrAMINE  (BENADRYL ) 25 mg in sodium chloride  0.9 % 50 mL IVPB, 25 mg, Intravenous, Q4H PRN, Hazeline Lister, Emma M, PA-C, Last Rate: 100 mL/hr at 11/22/23 1536, 25 mg at 11/22/23 1536   docusate sodium  (COLACE) capsule 100 mg, 100 mg, Oral, Daily, Rhyne, Samantha J, PA-C, 100 mg at 11/24/23 1478   DULoxetine  (CYMBALTA ) DR capsule 30 mg, 30 mg, Oral, Daily, Rhyne, Samantha J, PA-C, 30 mg at 11/24/23 2956   guaiFENesin -dextromethorphan   (ROBITUSSIN DM) 100-10 MG/5ML syrup 15 mL, 15 mL, Oral, Q4H PRN, Rhyne, Samantha J, PA-C   heparin  injection 5,000 Units, 5,000 Units, Subcutaneous, Q8H, Rhyne, Samantha J, PA-C, 5,000 Units at 11/24/23 0509   hydrALAZINE  (APRESOLINE ) injection 5 mg, 5 mg, Intravenous, Q20 Min PRN, Rhyne, Samantha J, PA-C   labetalol  (NORMODYNE ) injection 10 mg, 10 mg, Intravenous, Q10 min PRN, Rhyne, Samantha J, PA-C   magnesium  sulfate IVPB 2 g 50 mL, 2 g, Intravenous, Daily PRN, Rhyne, Samantha J, PA-C   metoprolol  tartrate (LOPRESSOR ) injection 2-5 mg, 2-5 mg, Intravenous, Q2H PRN, Rhyne, Samantha J, PA-C   morphine  (PF) 2 MG/ML injection 2 mg, 2 mg, Intravenous, Q2H PRN, Rhyne, Samantha J, PA-C, 2 mg at 11/24/23 2130   ondansetron  (ZOFRAN ) injection 4 mg, 4 mg, Intravenous, Q6H PRN, Rhyne, Samantha J, PA-C   oxyCODONE -acetaminophen  (PERCOCET/ROXICET) 5-325 MG per tablet 1-2 tablet, 1-2 tablet, Oral, Q4H PRN, Rhyne, Samantha J, PA-C, 2 tablet at 11/23/23 1640   pantoprazole  (PROTONIX ) EC tablet 40 mg, 40 mg, Oral, Daily, Rhyne, Samantha J, PA-C, 40 mg at 11/24/23 8657   phenol (CHLORASEPTIC) mouth spray 1 spray, 1 spray, Mouth/Throat, PRN, Rhyne, Samantha J, PA-C   polyethylene glycol (MIRALAX  / GLYCOLAX ) packet 17 g, 17 g, Oral, Daily PRN, Rhyne, Samantha J, PA-C   polyvinyl alcohol  (LIQUIFILM TEARS) 1.4 % ophthalmic solution 1 drop, 1 drop, Both Eyes, TID PRN, Rhyne, Samantha J, PA-C   potassium chloride  SA (KLOR-CON  M) CR tablet 20-40 mEq, 20-40 mEq, Oral, Daily PRN, Rhyne, Samantha J, PA-C   tamsulosin  (FLOMAX ) capsule 0.4 mg, 0.4 mg, Oral, Daily, Rhyne, Samantha J, PA-C, 0.4 mg at 11/24/23 8469  Patients Current Diet:  Diet Order             Diet Heart Room service appropriate? Yes; Fluid consistency: Thin  Diet effective now                   Precautions / Restrictions Precautions Precautions: Fall Other Brace: L limb protector Restrictions Weight Bearing Restrictions Per Provider Order:  Yes LLE Weight Bearing Per Provider Order: Non weight bearing (bka)   Has the patient had 2 or more falls or a fall with injury in the past year? Yes  Prior Activity Level Community (5-7x/wk): Pt. active in the community PTA  Prior Functional Level Self Care: Did the patient need help bathing, dressing, using the toilet or eating? Independent  Indoor Mobility: Did the patient need assistance with walking from room to room (with or without device)?  Independent  Stairs: Did the patient need assistance with internal or external stairs (with or without device)? Independent  Functional Cognition: Did the patient need help planning regular tasks such as shopping or remembering to take medications? Independent  Patient Information Are you of Hispanic, Latino/a,or Spanish origin?: A. No, not of Hispanic, Latino/a, or Spanish origin What is your race?: A. White Do you need or want an interpreter to communicate with a doctor or health care staff?: 0. No  Patient's Response To:  Health Literacy and Transportation Is the patient able to respond to health literacy and transportation needs?: Yes Health Literacy - How often do you need to have someone help you when you read instructions, pamphlets, or other written material from your doctor or pharmacy?: Never In the past 12 months, has lack of transportation kept you from medical appointments or from getting medications?: No In the past 12 months, has lack of transportation kept you from meetings, work, or from getting things needed for daily living?: No  Journalist, newspaper / Equipment Home Equipment: Occupational hygienist (4 wheels), [employer] - single point, Information systems manager, Art gallery manager, Wheelchair - manual, Other (comment) ([facility] is getting [facility] transport chair)  Prior Device Use: Indicate devices/aids used by the patient prior to current illness, exacerbation or injury? None of the above  Current Functional Level Cognition  Orientation Level: Oriented X4     Extremity Assessment (includes Sensation/Coordination)  Upper Extremity Assessment: Defer to OT evaluation  Lower Extremity Assessment: LLE deficits/detail LLE Deficits / Details: Limited knee flexion ROM. Able to SLR and activate quad LLE Sensation: WNL    ADLs  Overall ADL's : Needs assistance/impaired Eating/Feeding: Independent, Sitting Grooming: Set up, Sitting Upper Body Bathing: Set up, Sitting Lower Body Bathing: Minimal assistance, Sitting/lateral leans Upper Body Dressing : Set up, Sitting Lower Body Dressing: Minimal assistance, Bed level Lower Body Dressing Details (indicate cue type and reason): shorts Toilet Transfer: Minimal assistance, Stand-pivot, BSC/3in1 Toileting- Clothing Manipulation and Hygiene: Set up, Sitting/lateral lean    Mobility  Overal bed mobility: Modified Independent General bed mobility comments: + use of bed rails and HOB elevated    Transfers  Overall transfer level: Needs assistance Equipment used: Rolling walker (2 wheels) Transfers: Sit to/from Stand Sit to Stand: Contact guard assist Bed to/from chair/wheelchair/BSC transfer type:: Step pivot Step pivot transfers: Min assist General transfer comment: CGA for safety, good recall for hand placement    Ambulation / Gait / Stairs / Wheelchair Mobility  Ambulation/Gait Ambulation/Gait assistance: Editor, commissioning (Feet): 24 Feet Assistive device: Rolling walker (2 wheels) Gait Pattern/deviations:  (hop to) General Gait Details: min A to maintain balanace especailly wih last ~5' as pt with flexed RLE in stance due to fatigue, cues to keep RW in contact with floor Gait velocity: decr    Posture / Balance Balance Overall balance assessment: Needs assistance Sitting-balance support: No upper extremity supported Sitting balance-Leahy Scale: Good Standing balance support: Bilateral upper extremity supported, During functional activity, Reliant on assistive device for  balance Standing balance-Leahy Scale: Poor Standing balance comment: reliant on UE support    Special needs/care consideration Skin leg/left   Previous Home Environment (from acute therapy documentation) Living Arrangements: Spouse/significant other Available Help at Discharge: Family, Available 24 hours/day, Other (Comment) (wife cannot provide physical assist) Type of Home: House Home Layout: One level Home Access: Stairs to enter, Other (comment) ([facility] is measuring for a ramp) Entrance Stairs-Number of Steps: 2 Bathroom Shower/Tub: Psychologist, counselling, Engineer, manufacturing systems:  Standard Bathroom Accessibility: Yes How Accessible: Accessible via walker, Accessible via wheelchair Home Care Services: No  Discharge Living Setting Plans for Discharge Living Setting: Patient's home, House Type of Home at Discharge: House Discharge Home Layout: One level Discharge Home Access: Stairs to enter Entrance Stairs-Rails: Right, Left Entrance Stairs-Number of Steps: 2 Discharge Bathroom Shower/Tub: Walk-in shower Discharge Bathroom Toilet: Standard Discharge Bathroom Accessibility: Yes How Accessible: Accessible via walker, Accessible via wheelchair Does the patient have any problems obtaining your medications?: No  Social/Family/Support Systems Patient Roles: Spouse Contact Information: [phone] Anticipated Caregiver: Darlene Anticipated Caregiver's Contact Information: Min A Ability/Limitations of Caregiver: 24/7 Caregiver Availability: 24/7 Discharge Plan Discussed with Primary Caregiver: Yes Is Caregiver In Agreement with Plan?: Yes Does Caregiver/Family have Issues with Lodging/Transportation while Pt is in Rehab?: Yes  Goals Patient/Family Goal for Rehab: PT/OT Supervision Expected length of stay: 10-12 days Pt/Family Agrees to Admission and willing to participate: Yes Program Orientation Provided & Reviewed with Pt/Caregiver Including Roles  & Responsibilities:  Yes  Decrease burden of Care through IP rehab admission: NA  Possible need for SNF placement upon discharge: Not anticipated  Patient Condition: I have reviewed medical records from [facility], spoken with CM, and patient. I met with patient at the bedside for inpatient rehabilitation assessment.  Patient will benefit from ongoing PT and OT, can actively participate in 3 hours of therapy a day 5 days of the week, and can make measurable gains during the admission.  Patient will also benefit from the coordinated team approach during an Inpatient Acute Rehabilitation admission.  The patient will receive intensive therapy as well as Rehabilitation physician, nursing, social worker, and care management interventions.  Due to safety, skin/wound care, disease management, medication administration, pain management, and patient education the patient requires 24 hour a day rehabilitation nursing.  The patient is currently CGA-Min A with mobility and Set up-Min A with basic ADLs.  Discharge setting and therapy post discharge at home with home health is anticipated.  Patient has agreed to participate in the Acute Inpatient Rehabilitation Program and will admit today.  Preadmission Screen Completed By:  Dorena Gander, with updates by Amiel Kalata, 11/24/2023 9:48 AM ______________________________________________________________________   Discussed status with Dr. Rachel Budds   on 11/26/23  at 10:01 AM and received approval for admission today.  Admission Coordinator:  Dorena Gander, CCC-SLP, time 10:01 AM/Date 11/26/23    Assessment/Plan: Diagnosis: left BKA Does the need for close, 24 hr/day Medical supervision in concert with the patient's rehab needs make it unreasonable for this patient to be served in a less intensive setting? Yes Co-Morbidities requiring supervision/potential complications: MI. CAD, depression, ptsd, prior cva Due to bladder management, bowel management, safety, skin/wound  care, disease management, medication administration, pain management, and patient education, does the patient require 24 hr/day rehab nursing? Yes Does the patient require coordinated care of a physician, rehab nurse, PT, OT to address physical and functional deficits in the context of the above medical diagnosis(es)? Yes Addressing deficits in the following areas: balance, endurance, locomotion, strength, transferring, bowel/bladder control, bathing, dressing, feeding, grooming, toileting, and psychosocial support Can the patient actively participate in an intensive therapy program of at least 3 hrs of therapy 5 days a week? Yes The potential for patient to make measurable gains while on inpatient rehab is excellent Anticipated functional outcomes upon discharge from inpatient rehab: supervision PT, supervision OT, n/a SLP Estimated rehab length of stay to reach the above functional goals is: 10-12 days  Anticipated discharge destination: Home 10. Overall Rehab/Functional Prognosis: excellent   MD Signature: Rawland Caddy, MD, [facility] Physical Medicine & Rehabilitation Medical Director Rehabilitation Services 11/26/2023

## 2023-11-24 NOTE — Plan of Care (Signed)
  Problem: Education: Goal: Knowledge of General Education information will improve Description: Including pain rating scale, medication(s)/side effects and non-pharmacologic comfort measures Outcome: Progressing   Problem: Health Behavior/Discharge Planning: Goal: Ability to manage health-related needs will improve Outcome: Progressing   Problem: Clinical Measurements: Goal: Ability to maintain clinical measurements within normal limits will improve Outcome: Progressing Goal: Will remain free from infection Outcome: Progressing Goal: Diagnostic test results will improve Outcome: Progressing Goal: Respiratory complications will improve Outcome: Progressing Goal: Cardiovascular complication will be avoided Outcome: Progressing   Problem: Activity: Goal: Risk for activity intolerance will decrease Outcome: Progressing   Problem: Coping: Goal: Level of anxiety will decrease Outcome: Progressing   Problem: Elimination: Goal: Will not experience complications related to bowel motility Outcome: Progressing   Problem: Pain Managment: Goal: General experience of comfort will improve and/or be controlled Outcome: Progressing   Problem: Safety: Goal: Ability to remain free from injury will improve Outcome: Progressing   Problem: Skin Integrity: Goal: Risk for impaired skin integrity will decrease Outcome: Progressing   Problem: Clinical Measurements: Goal: Will remain free from infection Outcome: Progressing Goal: Diagnostic test results will improve Outcome: Progressing Goal: Respiratory complications will improve Outcome: Progressing   Problem: Activity: Goal: Risk for activity intolerance will decrease Outcome: Progressing

## 2023-11-24 NOTE — Discharge Summary (Signed)
 [facility] Discharge Summary   Patient ID:  [name] [name] MRN: [id number] DOB/AGE: Nov 08, 1945 77 y.o.  Admit date: 11/21/2023 Discharge date: 11/24/2023 Date of Surgery: 11/21/2023 Surgeon: Surgeon(s): Margherita Shell, MD  Admission Diagnosis: Mononeuropathy of left lower extremity [G57.92] Below-knee amputation of left lower extremity [facility]) [S88.112A] Leg pain [M79.606]  Discharge Diagnoses:  Mononeuropathy of left lower extremity [G57.92] Below-knee amputation of left lower extremity (HCC) [W09.811B] Leg pain [M79.606]  Secondary Diagnoses: Past Medical History:  Diagnosis Date   Anxiety    Cancer (HCC)    skin cancer   Carpal tunnel syndrome    Coronary artery disease    DES x4   Depression    Hepatitis    Hepatitis A during [place] - received treatment   Hypercholesteremia    Hypertension    Mononeuropathy    Left Leg   Myocardial infarction (HCC) 2010ish   Peripheral vascular disease (HCC)    Pneumonia    PTSD (post-traumatic stress disorder)    PTSD (post-traumatic stress disorder)    Sleep apnea    doesnt use CPAP. Sinus surgery fixed OSA issues   Stroke (HCC)    slight weakness left- 85- 90 % returned    Procedure(s): AMPUTATION BELOW KNEE  Discharged Condition: stable  HPI: [name] [name] is a 78 y.o. male who I saw in April 2025 for evaluation of left leg pain that he has been dealing with since 2019.  It began as blisters on the bottom of his foot now the pain goes up to his mid calf and affects him at night.  The only thing that helps him is soaking the foot in ice water.  He has undergone multiple treatments and medication trials without improvement.  His option was to have left BKA for pain control.   Hospital Course:  [name] [name] is a 78 y.o. male is S/P Left Procedure(s): AMPUTATION BELOW KNEE Post op day 3 left BKA.  He is improving with mobility and the incision is healing well without signs of infection.   Compression stump sock in place.  Stump is warm and well perfused without ischemia.  He will be transferred to CIR for rehab.  F/U with our office in 4 weeks for staple removal.    Significant Diagnostic Studies: CBC Lab Results  Component Value Date   WBC 9.4 11/23/2023   HGB 10.7 (L) 11/23/2023   HCT 30.8 (L) 11/23/2023   MCV 90.1 11/23/2023   PLT 157 11/23/2023    BMET    Component Value Date/Time   NA 133 (L) 11/23/2023 0314   K 3.9 11/23/2023 0314   CL 99 11/23/2023 0314   CO2 27 11/23/2023 0314   GLUCOSE 134 (H) 11/23/2023 0314   BUN 11 11/23/2023 0314   CREATININE 0.66 11/23/2023 0314   CALCIUM  8.0 (L) 11/23/2023 0314   GFRNONAA >60 11/23/2023 0314   GFRAA >60 07/31/2019 1241   COAG Lab Results  Component Value Date   INR 1.1 11/13/2023   INR 1.0 07/29/2020   INR 1.06 02/28/2016     Disposition:  Discharge to :Rehab Discharge Instructions     Call MD for:  redness, tenderness, or signs of infection (pain, swelling, bleeding, redness, odor or green/yellow discharge around incision site)   Complete by: As directed    Call MD for:  severe or increased pain, loss or decreased feeling  in affected limb(s)   Complete by: As directed    Call MD for:  temperature >100.5   Complete by: As directed    Resume previous diet   Complete by: As directed       Allergies as of 11/24/2023       Reactions   Dilaudid [hydromorphone Hcl] Other (See Comments)   "CRASHES" per patient Talked to nurse and she said pt is afraid to take dilaudid because pt states he flat-lined when they gave it to him.Aaron AasAaron AasPt doesn't mind taking Percocet    Clindamycin/lincomycin Other (See Comments)   Blisters in mouth   Oxcarbazepine Other (See Comments)   Other reaction(s): Delirium        Medication List     TAKE these medications    aspirin  EC 81 MG tablet Take 81 mg by mouth daily.   atorvastatin  80 MG tablet Commonly known as: LIPITOR Take 80 mg by mouth daily.   buPROPion   150 MG 12 hr tablet Commonly known as: WELLBUTRIN  SR Take 150 mg by mouth daily.   buPROPion  150 MG 24 hr tablet Commonly known as: WELLBUTRIN  XL Take 150 mg by mouth daily.   carboxymethylcellulose 0.5 % Soln Commonly known as: REFRESH PLUS Place 1 drop into both eyes 3 (three) times daily as needed (dry eyes).   Cholecalciferol 50 MCG (2000 UT) Tabs Take 1 tablet by mouth daily.   cyanocobalamin  500 MCG tablet Commonly known as: VITAMIN B12 Take 500 mcg by mouth daily.   DULoxetine  30 MG capsule Commonly known as: CYMBALTA  Take 30 mg by mouth daily.   nitroGLYCERIN  0.2 mg/hr patch Commonly known as: NITRODUR - Dosed in mg/24 hr Place 1 patch (0.2 mg total) onto the skin daily.   omeprazole 20 MG capsule Commonly known as: PRILOSEC Take 20 mg by mouth daily.   predniSONE  50 MG tablet Commonly known as: DELTASONE  Take one tablet by mouth once daily for 5 days.   tamsulosin  0.4 MG Caps capsule Commonly known as: FLOMAX  Take 0.4 mg by mouth daily.       Verbal and written Discharge instructions given to the patient. Wound care per Discharge AVS  Follow-up Information     [facility] Follow up in 5 week(s).   Specialty: Vascular Surgery Why: Office will call you to arrange your appt (sent). Contact information: [address] [place]  [address] [phone]                Signed: Rocky Cipro 11/24/2023, 8:39 AM

## 2023-11-25 NOTE — Progress Notes (Signed)
 CIR- pending.

## 2023-11-25 NOTE — Plan of Care (Signed)
 Problem: Education: Goal: Knowledge of General Education information will improve Description: Including pain rating scale, medication(s)/side effects and non-pharmacologic comfort measures Outcome: Progressing   Problem: Health Behavior/Discharge Planning: Goal: Ability to manage health-related needs will improve Outcome: Progressing   Problem: Clinical Measurements: Goal: Ability to maintain clinical measurements within normal limits will improve Outcome: Progressing Goal: Will remain free from infection Outcome: Progressing Goal: Diagnostic test results will improve Outcome: Progressing Goal: Respiratory complications will improve Outcome: Progressing Goal: Cardiovascular complication will be avoided Outcome: Progressing   Problem: Activity: Goal: Risk for activity intolerance will decrease Outcome: Progressing   Problem: Coping: Goal: Level of anxiety will decrease Outcome: Progressing   Problem: Elimination: Goal: Will not experience complications related to bowel motility Outcome: Progressing Goal: Will not experience complications related to urinary retention Outcome: Progressing   Problem: Pain Managment: Goal: General experience of comfort will improve and/or be controlled Outcome: Progressing   Problem: Safety: Goal: Ability to remain free from injury will improve Outcome: Progressing   Problem: Skin Integrity: Goal: Risk for impaired skin integrity will decrease Outcome: Progressing   Problem: Education: Goal: Knowledge of the prescribed therapeutic regimen will improve Outcome: Progressing Goal: Ability to verbalize activity precautions or restrictions will improve Outcome: Progressing Goal: Understanding of discharge needs will improve Outcome: Progressing   Problem: Activity: Goal: Ability to perform//tolerate increased activity and mobilize with assistive devices will improve Outcome: Progressing   Problem: Clinical Measurements: Goal:  Postoperative complications will be avoided or minimized Outcome: Progressing   Problem: Self-Care: Goal: Ability to meet self-care needs will improve Outcome: Progressing   Problem: Self-Concept: Goal: Ability to maintain and perform role responsibilities to the fullest extent possible will improve Outcome: Progressing   Problem: Pain Management: Goal: Pain level will decrease with appropriate interventions Outcome: Progressing   Problem: Skin Integrity: Goal: Demonstration of wound healing without infection will improve Outcome: Progressing   Problem: Education: Goal: Knowledge of General Education information will improve Description: Including pain rating scale, medication(s)/side effects and non-pharmacologic comfort measures Outcome: Progressing   Problem: Health Behavior/Discharge Planning: Goal: Ability to manage health-related needs will improve Outcome: Progressing   Problem: Clinical Measurements: Goal: Ability to maintain clinical measurements within normal limits will improve Outcome: Progressing Goal: Will remain free from infection Outcome: Progressing Goal: Diagnostic test results will improve Outcome: Progressing Goal: Respiratory complications will improve Outcome: Progressing Goal: Cardiovascular complication will be avoided Outcome: Progressing   Problem: Activity: Goal: Risk for activity intolerance will decrease Outcome: Progressing   Problem: Coping: Goal: Level of anxiety will decrease Outcome: Progressing   Problem: Elimination: Goal: Will not experience complications related to bowel motility Outcome: Progressing Goal: Will not experience complications related to urinary retention Outcome: Progressing   Problem: Pain Managment: Goal: General experience of comfort will improve and/or be controlled Outcome: Progressing   Problem: Safety: Goal: Ability to remain free from injury will improve Outcome: Progressing   Problem: Skin  Integrity: Goal: Risk for impaired skin integrity will decrease Outcome: Progressing   Problem: Education: Goal: Knowledge of the prescribed therapeutic regimen will improve Outcome: Progressing Goal: Ability to verbalize activity precautions or restrictions will improve Outcome: Progressing Goal: Understanding of discharge needs will improve Outcome: Progressing   Problem: Activity: Goal: Ability to perform//tolerate increased activity and mobilize with assistive devices will improve Outcome: Progressing   Problem: Clinical Measurements: Goal: Postoperative complications will be avoided or minimized Outcome: Progressing   Problem: Self-Care: Goal: Ability to meet self-care needs will improve Outcome: Progressing   Problem: Self-Concept:  Goal: Ability to maintain and perform role responsibilities to the fullest extent possible will improve Outcome: Progressing   Problem: Pain Management: Goal: Pain level will decrease with appropriate interventions Outcome: Progressing

## 2023-11-25 NOTE — Plan of Care (Signed)
  Problem: Clinical Measurements: Goal: Ability to maintain clinical measurements within normal limits will improve Outcome: Progressing Goal: Will remain free from infection Outcome: Progressing Goal: Cardiovascular complication will be avoided Outcome: Progressing   Problem: Activity: Goal: Risk for activity intolerance will decrease Outcome: Progressing

## 2023-11-25 NOTE — Progress Notes (Addendum)
 [facility]  Subjective  - Getting better with time, no new complaints   Objective 119/64 92 98.4 F (36.9 C) (Oral) 19 94%  Intake/Output Summary (Last 24 hours) at 11/25/2023 0717 Last data filed at 11/24/2023 2000 Gross per 24 hour  Intake --  Output 450 ml  Net -450 ml    Incision healing well without signs of ischemia or infection.  No drainage or erythema Motor and sensation intact. Improved pain  General no acute distress  Assessment/Planning: S/P left BKA for uncontrolled chronic pain  Well healing incision with intact staples.   He will be discharged to [facility] Monday 11/26/23 for rehab. F/U in our office will be arrange for 4 weeks.  Rocky Cipro 11/25/2023 7:17 AM --  Laboratory Lab Results: Recent Labs    11/23/23 0314  WBC 9.4  HGB 10.7*  HCT 30.8*  PLT 157   BMET Recent Labs    11/23/23 0314  NA 133*  K 3.9  CL 99  CO2 27  GLUCOSE 134*  BUN 11  CREATININE 0.66  CALCIUM  8.0*    COAG Lab Results  Component Value Date   INR 1.1 11/13/2023   INR 1.0 07/29/2020   INR 1.06 02/28/2016   No results found for: "PTT"  I have seen and evaluated the patient. I agree with the PA note as documented above.  Left BKA looks good.  Awaiting CIR.  Discussed elevation with stump shrinker.  Young Hensen, MD [facility] [place] [place] Office: [phone]

## 2023-11-26 ENCOUNTER — Other Ambulatory Visit: Payer: Self-pay

## 2023-11-26 ENCOUNTER — Encounter (HOSPITAL_COMMUNITY): Payer: Self-pay | Admitting: Physical Medicine & Rehabilitation

## 2023-11-26 ENCOUNTER — Inpatient Hospital Stay (HOSPITAL_COMMUNITY)
Admission: AD | Admit: 2023-11-26 | Discharge: 2023-12-03 | DRG: 560 | Disposition: A | Discharge status: Home Health | Source: Intra-hospital | Attending: Physical Medicine & Rehabilitation | Admitting: Physical Medicine & Rehabilitation

## 2023-11-26 DIAGNOSIS — S88112A Complete traumatic amputation at level between knee and ankle, left lower leg, initial encounter: Secondary | ICD-10-CM

## 2023-11-26 DIAGNOSIS — F32A Depression, unspecified: Secondary | ICD-10-CM | POA: Diagnosis present

## 2023-11-26 DIAGNOSIS — I10 Essential (primary) hypertension: Secondary | ICD-10-CM | POA: Diagnosis present

## 2023-11-26 DIAGNOSIS — I69354 Hemiplegia and hemiparesis following cerebral infarction affecting left non-dominant side: Secondary | ICD-10-CM | POA: Diagnosis not present

## 2023-11-26 DIAGNOSIS — K219 Gastro-esophageal reflux disease without esophagitis: Secondary | ICD-10-CM | POA: Diagnosis present

## 2023-11-26 DIAGNOSIS — I252 Old myocardial infarction: Secondary | ICD-10-CM | POA: Diagnosis not present

## 2023-11-26 DIAGNOSIS — E871 Hypo-osmolality and hyponatremia: Secondary | ICD-10-CM | POA: Diagnosis present

## 2023-11-26 DIAGNOSIS — K5901 Slow transit constipation: Secondary | ICD-10-CM

## 2023-11-26 DIAGNOSIS — L03116 Cellulitis of left lower limb: Secondary | ICD-10-CM | POA: Diagnosis present

## 2023-11-26 DIAGNOSIS — Z7982 Long term (current) use of aspirin: Secondary | ICD-10-CM

## 2023-11-26 DIAGNOSIS — Z881 Allergy status to other antibiotic agents status: Secondary | ICD-10-CM | POA: Diagnosis not present

## 2023-11-26 DIAGNOSIS — Z9841 Cataract extraction status, right eye: Secondary | ICD-10-CM | POA: Diagnosis not present

## 2023-11-26 DIAGNOSIS — Z89512 Acquired absence of left leg below knee: Secondary | ICD-10-CM | POA: Diagnosis not present

## 2023-11-26 DIAGNOSIS — G8918 Other acute postprocedural pain: Secondary | ICD-10-CM

## 2023-11-26 DIAGNOSIS — Z4781 Encounter for orthopedic aftercare following surgical amputation: Principal | ICD-10-CM

## 2023-11-26 DIAGNOSIS — R7301 Impaired fasting glucose: Secondary | ICD-10-CM | POA: Diagnosis present

## 2023-11-26 DIAGNOSIS — F419 Anxiety disorder, unspecified: Secondary | ICD-10-CM | POA: Diagnosis present

## 2023-11-26 DIAGNOSIS — K5900 Constipation, unspecified: Secondary | ICD-10-CM | POA: Diagnosis not present

## 2023-11-26 DIAGNOSIS — E7800 Pure hypercholesterolemia, unspecified: Secondary | ICD-10-CM | POA: Diagnosis present

## 2023-11-26 DIAGNOSIS — Z955 Presence of coronary angioplasty implant and graft: Secondary | ICD-10-CM

## 2023-11-26 DIAGNOSIS — I739 Peripheral vascular disease, unspecified: Secondary | ICD-10-CM | POA: Diagnosis present

## 2023-11-26 DIAGNOSIS — Z8249 Family history of ischemic heart disease and other diseases of the circulatory system: Secondary | ICD-10-CM | POA: Diagnosis not present

## 2023-11-26 DIAGNOSIS — Z801 Family history of malignant neoplasm of trachea, bronchus and lung: Secondary | ICD-10-CM

## 2023-11-26 DIAGNOSIS — Z87891 Personal history of nicotine dependence: Secondary | ICD-10-CM | POA: Diagnosis not present

## 2023-11-26 DIAGNOSIS — Z79899 Other long term (current) drug therapy: Secondary | ICD-10-CM | POA: Diagnosis not present

## 2023-11-26 DIAGNOSIS — G4733 Obstructive sleep apnea (adult) (pediatric): Secondary | ICD-10-CM | POA: Diagnosis present

## 2023-11-26 DIAGNOSIS — D62 Acute posthemorrhagic anemia: Secondary | ICD-10-CM | POA: Diagnosis present

## 2023-11-26 DIAGNOSIS — N400 Enlarged prostate without lower urinary tract symptoms: Secondary | ICD-10-CM | POA: Diagnosis present

## 2023-11-26 DIAGNOSIS — I2510 Atherosclerotic heart disease of native coronary artery without angina pectoris: Secondary | ICD-10-CM | POA: Diagnosis present

## 2023-11-26 DIAGNOSIS — Z85828 Personal history of other malignant neoplasm of skin: Secondary | ICD-10-CM | POA: Diagnosis not present

## 2023-11-26 DIAGNOSIS — Z9842 Cataract extraction status, left eye: Secondary | ICD-10-CM

## 2023-11-26 DIAGNOSIS — F4310 Post-traumatic stress disorder, unspecified: Secondary | ICD-10-CM | POA: Diagnosis present

## 2023-11-26 DIAGNOSIS — Z888 Allergy status to other drugs, medicaments and biological substances status: Secondary | ICD-10-CM

## 2023-11-26 LAB — COMPREHENSIVE METABOLIC PANEL WITH GFR
ALT: 67 U/L — ABNORMAL HIGH (ref 0–44)
AST: 101 U/L — ABNORMAL HIGH (ref 15–41)
Albumin: 2.8 g/dL — ABNORMAL LOW (ref 3.5–5.0)
Alkaline Phosphatase: 120 U/L (ref 38–126)
Anion gap: 11 (ref 5–15)
BUN: 12 mg/dL (ref 8–23)
CO2: 27 mmol/L (ref 22–32)
Calcium: 9.1 mg/dL (ref 8.9–10.3)
Chloride: 98 mmol/L (ref 98–111)
Creatinine, Ser: 0.7 mg/dL (ref 0.61–1.24)
GFR, Estimated: >60 mL/min (ref >60)
Glucose, Bld: 147 mg/dL — ABNORMAL HIGH (ref 70–99)
Potassium: 3.8 mmol/L (ref 3.5–5.1)
Sodium: 136 mmol/L (ref 135–145)
Total Bilirubin: 0.7 mg/dL (ref 0.0–1.2)
Total Protein: 6.3 g/dL — ABNORMAL LOW (ref 6.5–8.1)

## 2023-11-26 MED ORDER — POLYETHYLENE GLYCOL 3350 17 G PO PACK: 17.0000 g | PACK | Freq: Every day | ORAL | Status: DC | PRN

## 2023-11-26 MED ORDER — ATORVASTATIN CALCIUM 80 MG PO TABS: 80.0000 mg | ORAL_TABLET | Freq: Every day | ORAL | Status: DC

## 2023-11-26 MED ORDER — CEPHALEXIN 250 MG PO CAPS
500.0000 mg | ORAL_CAPSULE | Freq: Three times a day (TID) | ORAL | Status: AC
  Administered 2023-11-26 – 2023-12-01 (×14): 500 mg via ORAL
  Filled 2023-11-26 (×14): qty 2

## 2023-11-26 MED ORDER — FLEET ENEMA RE ENEM: 1.0000 | ENEMA | Freq: Once | RECTAL | Status: DC | PRN

## 2023-11-26 MED ORDER — HEPARIN SODIUM (PORCINE) 5000 UNIT/ML IJ SOLN: 5000.0000 [IU] | Freq: Three times a day (TID) | INTRAMUSCULAR | Status: DC

## 2023-11-26 MED ORDER — TAMSULOSIN HCL 0.4 MG PO CAPS: 0.4000 mg | ORAL_CAPSULE | Freq: Every day | ORAL | Status: DC

## 2023-11-26 MED ORDER — ATORVASTATIN CALCIUM 80 MG PO TABS
80.0000 mg | ORAL_TABLET | Freq: Every day | ORAL | Status: DC
  Administered 2023-11-27 – 2023-12-03 (×7): 80 mg via ORAL
  Filled 2023-11-26 (×7): qty 1

## 2023-11-26 MED ORDER — POLYVINYL ALCOHOL 1.4 % OP SOLN: 1.0000 [drp] | Freq: Three times a day (TID) | OPHTHALMIC | Status: DC | PRN

## 2023-11-26 MED ORDER — PHENOL 1.4 % MT LIQD: 1.0000 | OROMUCOSAL | Status: DC | PRN

## 2023-11-26 MED ORDER — ASPIRIN 81 MG PO TBEC: 81.0000 mg | DELAYED_RELEASE_TABLET | Freq: Every day | ORAL | Status: DC

## 2023-11-26 MED ORDER — ENOXAPARIN SODIUM 40 MG/0.4ML IJ SOSY
40.0000 mg | PREFILLED_SYRINGE | INTRAMUSCULAR | Status: DC
  Administered 2023-11-26 – 2023-12-02 (×7): 40 mg via SUBCUTANEOUS
  Filled 2023-11-26 (×7): qty 0.4

## 2023-11-26 MED ORDER — GUAIFENESIN-DM 100-10 MG/5ML PO SYRP: 10.0000 mL | ORAL_SOLUTION | Freq: Four times a day (QID) | ORAL | Status: DC | PRN

## 2023-11-26 MED ORDER — DULOXETINE HCL 30 MG PO CPEP: 30.0000 mg | ORAL_CAPSULE | Freq: Every day | ORAL | Status: DC

## 2023-11-26 MED ORDER — ASPIRIN 81 MG PO TBEC
81.0000 mg | DELAYED_RELEASE_TABLET | Freq: Every day | ORAL | Status: DC
  Administered 2023-11-27 – 2023-12-03 (×7): 81 mg via ORAL
  Filled 2023-11-26 (×7): qty 1

## 2023-11-26 MED ORDER — OXYCODONE-ACETAMINOPHEN 5-325 MG PO TABS
1.0000 | ORAL_TABLET | ORAL | Status: DC | PRN
  Administered 2023-11-28 – 2023-12-02 (×5): 1 via ORAL
  Filled 2023-11-26 (×5): qty 1

## 2023-11-26 MED ORDER — POLYVINYL ALCOHOL 1.4 % OP SOLN
1.0000 [drp] | Freq: Three times a day (TID) | OPHTHALMIC | Status: DC | PRN
Start: 2023-11-26 — End: 2023-12-03

## 2023-11-26 MED ORDER — VITAMIN B-12 1000 MCG PO TABS
500.0000 ug | ORAL_TABLET | Freq: Every day | ORAL | Status: DC
  Administered 2023-11-27 – 2023-12-03 (×7): 500 ug via ORAL
  Filled 2023-11-26 (×7): qty 1

## 2023-11-26 MED ORDER — ALUM & MAG HYDROXIDE-SIMETH 200-200-20 MG/5ML PO SUSP: 30.0000 mL | ORAL | Status: DC | PRN

## 2023-11-26 MED ORDER — BUPROPION HCL ER (XL) 150 MG PO TB24
150.0000 mg | ORAL_TABLET | Freq: Every day | ORAL | Status: DC
  Administered 2023-11-27 – 2023-12-03 (×7): 150 mg via ORAL
  Filled 2023-11-26 (×7): qty 1

## 2023-11-26 MED ORDER — VITAMIN B-12 1000 MCG PO TABS: 500.0000 ug | ORAL_TABLET | Freq: Every day | ORAL | Status: DC

## 2023-11-26 MED ORDER — DOCUSATE SODIUM 100 MG PO CAPS
100.0000 mg | ORAL_CAPSULE | Freq: Two times a day (BID) | ORAL | Status: DC
  Administered 2023-11-26 – 2023-12-03 (×10): 100 mg via ORAL
  Filled 2023-11-26 (×13): qty 1

## 2023-11-26 MED ORDER — BUPROPION HCL ER (XL) 150 MG PO TB24
150.0000 mg | ORAL_TABLET | Freq: Every day | ORAL | Status: DC
  Filled 2023-11-26: qty 1

## 2023-11-26 MED ORDER — ONDANSETRON HCL 4 MG PO TABS: 4.0000 mg | ORAL_TABLET | Freq: Four times a day (QID) | ORAL | Status: DC | PRN

## 2023-11-26 MED ORDER — DULOXETINE HCL 30 MG PO CPEP
30.0000 mg | ORAL_CAPSULE | Freq: Every day | ORAL | Status: DC
  Administered 2023-11-27 – 2023-12-03 (×7): 30 mg via ORAL
  Filled 2023-11-26 (×7): qty 1

## 2023-11-26 MED ORDER — TAMSULOSIN HCL 0.4 MG PO CAPS
0.4000 mg | ORAL_CAPSULE | Freq: Every day | ORAL | Status: DC
  Administered 2023-11-27 – 2023-12-03 (×7): 0.4 mg via ORAL
  Filled 2023-11-26 (×7): qty 1

## 2023-11-26 MED ORDER — PANTOPRAZOLE SODIUM 40 MG PO TBEC: 40.0000 mg | DELAYED_RELEASE_TABLET | Freq: Every day | ORAL | Status: DC

## 2023-11-26 MED ORDER — METHOCARBAMOL 500 MG PO TABS
500.0000 mg | ORAL_TABLET | Freq: Four times a day (QID) | ORAL | Status: DC | PRN
  Administered 2023-11-26: 500 mg via ORAL
  Filled 2023-11-26: qty 1

## 2023-11-26 MED ORDER — DIPHENHYDRAMINE HCL 25 MG PO CAPS: 25.0000 mg | ORAL_CAPSULE | Freq: Four times a day (QID) | ORAL | Status: DC | PRN

## 2023-11-26 MED ORDER — MELATONIN 5 MG PO TABS
5.0000 mg | ORAL_TABLET | Freq: Every evening | ORAL | Status: DC | PRN
  Administered 2023-11-26 – 2023-12-01 (×5): 5 mg via ORAL
  Filled 2023-11-26 (×6): qty 1

## 2023-11-26 MED ORDER — BISACODYL 10 MG RE SUPP: 10.0000 mg | Freq: Every day | RECTAL | Status: DC | PRN

## 2023-11-26 MED ORDER — ONDANSETRON HCL 4 MG/2ML IJ SOLN: 4.0000 mg | Freq: Four times a day (QID) | INTRAMUSCULAR | Status: DC | PRN

## 2023-11-26 MED ORDER — PANTOPRAZOLE SODIUM 40 MG PO TBEC
40.0000 mg | DELAYED_RELEASE_TABLET | Freq: Every day | ORAL | Status: DC
  Administered 2023-11-27 – 2023-12-03 (×7): 40 mg via ORAL
  Filled 2023-11-26 (×7): qty 1

## 2023-11-26 NOTE — H&P (Signed)
 Physical Medicine and Rehabilitation Admission H&P     CC: Functional deficits due to BKA.     HPI: [name] [name] is a 78 y.o. male with a PMHx of HTN, HLD, depression/anxiety, CAD s/p PCI x4 ([facility]), PTSD,  CVA w/residual L weakness, agent orange exposure, OSA s/p nasal surgeries, HOH, lumbar decompression in the past, mononeuropathy LLE with severe pain and blistering having failed multiple medical interventions and underwent L- BKA by Dr. Charlotte Cookey on 11/21/23. Post op ABLA noted with Hgb drop from 13.7 to 10.7. Therapy has been working with patient who requires CGA to min assist with ADLs and mobility. He was independent PTA admission and CIR recommended due to functional decline.      Review of Systems  Constitutional: Negative.   HENT: Negative.    Eyes: Negative.   Respiratory: Negative.    Cardiovascular: Negative.   Gastrointestinal: Negative.   Genitourinary: Negative.   Musculoskeletal:  Positive for joint pain and myalgias.  Skin: Negative.   Neurological:  Positive for sensory change and weakness.  Psychiatric/Behavioral:  The patient is not nervous/anxious.             Past Medical History:  Diagnosis Date   Anxiety     Cancer (HCC)      skin cancer   Carpal tunnel syndrome     Coronary artery disease      DES x4   Depression     Hepatitis      Hepatitis A during [place] - received treatment   Hypercholesteremia     Hypertension     Mononeuropathy      Left Leg   Myocardial infarction (HCC) 2010ish   Peripheral vascular disease (HCC)     Pneumonia     PTSD (post-traumatic stress disorder)     PTSD (post-traumatic stress disorder)     Sleep apnea      doesnt use CPAP. Sinus surgery fixed OSA issues   Stroke (HCC)      slight weakness left- 85- 90 % returned               Past Surgical History:  Procedure Laterality Date   AMPUTATION Left 11/21/2023    Procedure: AMPUTATION BELOW KNEE;  Surgeon: Margherita Shell, MD;  Location:  [facility] OR;  Service: Vascular;  Laterality: Left;  block   CARDIAC CATHETERIZATION   11/12/2021    DES stent placed   CARPAL TUNNEL RELEASE Right 06/10/2013    Procedure: RIGHT CARPAL TUNNEL RELEASE;  Surgeon: Arnie Lao, MD;  Location: [facility] OR;  Service: Orthopedics;  Laterality: Right;   CARPAL TUNNEL RELEASE Left     CATARACT EXTRACTION Bilateral     COLONOSCOPY       CORONARY ANGIOPLASTY       EXCISION HAGLUND'S DEFORMITY WITH ACHILLES TENDON REPAIR Right 06/24/2021    Procedure: RIGHT HAGLUNDS RESECTION;  Surgeon: Timothy Ford, MD;  Location: [facility] OR;  Service: Orthopedics;  Laterality: Right;   KNEE ARTHROSCOPY Right     KNEE ARTHROSCOPY Left 08/04/2019    Procedure: LEFT KNEE ARTHROSCOPY WITH PARTIAL MEDIAL MENISCECTOMY;  Surgeon: Arnie Lao, MD;  Location: [facility];  Service: Orthopedics;  Laterality: Left;   LUMBAR SPINE SURGERY        x2   NASAL SINUS SURGERY       stents   2012   TONSILLECTOMY  Family History  Problem Relation Age of Onset   Heart disease Mother          Several other maternal family members   Lung cancer Father          small cell CA/smoked          Social History: Married. Wife cannot provide physical assistance. He  reports that he quit smoking about 36 years ago. His smoking use included cigarettes. He started smoking about 56 years ago. He has a 40 pack-year smoking history. He has never used smokeless tobacco. He reports that he does not drink alcohol  and does not use drugs.     Allergies       Allergies  Allergen Reactions   Dilaudid [Hydromorphone Hcl] Other (See Comments)      "CRASHES" per patient Talked to nurse and she said pt is afraid to take dilaudid because pt states he flat-lined when they gave it to him.Aaron AasAaron AasPt doesn't mind taking Percocet    Clindamycin/Lincomycin Other (See Comments)      Blisters in mouth   Oxcarbazepine Other (See Comments)      Other reaction(s):  Delirium              Medications Prior to Admission  Medication Sig Dispense Refill   aspirin  EC 81 MG tablet Take 81 mg by mouth daily.       atorvastatin  (LIPITOR) 80 MG tablet Take 80 mg by mouth daily.       buPROPion  (WELLBUTRIN  XL) 150 MG 24 hr tablet Take 150 mg by mouth daily.       Cholecalciferol 50 MCG (2000 UT) TABS Take 1 tablet by mouth daily.       cyanocobalamin  (VITAMIN B12) 500 MCG tablet Take 500 mcg by mouth daily.       DULoxetine  (CYMBALTA ) 30 MG capsule Take 30 mg by mouth daily.       omeprazole (PRILOSEC) 20 MG capsule Take 20 mg by mouth daily.       tamsulosin  (FLOMAX ) 0.4 MG CAPS capsule Take 0.4 mg by mouth daily.       buPROPion  (WELLBUTRIN  SR) 150 MG 12 hr tablet Take 150 mg by mouth daily. (Patient not taking: Reported on 11/08/2023)       carboxymethylcellulose (REFRESH PLUS) 0.5 % SOLN Place 1 drop into both eyes 3 (three) times daily as needed (dry eyes).       nitroGLYCERIN  (NITRODUR - DOSED IN MG/24 HR) 0.2 mg/hr patch Place 1 patch (0.2 mg total) onto the skin daily. (Patient not taking: Reported on 11/08/2023) 30 patch 12   predniSONE  (DELTASONE ) 50 MG tablet Take one tablet by mouth once daily for 5 days. (Patient not taking: Reported on 11/08/2023) 5 tablet 0          Home: Home Living Family/patient expects to be discharged to:: Private residence Living Arrangements: Spouse/significant other Available Help at Discharge: Family, Available 24 hours/day, Other (Comment) (wife cannot provide physical assist) Type of Home: House Home Access: Stairs to enter, Other (comment) ([facility] is [facility] ramp) Entrance Stairs-Number of Steps: 2 Home Layout: One level Bathroom Shower/Tub: Psychologist, counselling, Engineer, manufacturing systems: Standard Bathroom Accessibility: Yes Home Equipment: Rollator (4 wheels), Cane - single point, Information systems manager, Art gallery manager, Wheelchair - manual, Other (comment) ([facility] is getting [facility] a transport chair)   Functional  History: Prior Function Prior Level of Function : Independent/Modified Independent Mobility Comments: walks with a cane   Functional Status:  Mobility: Bed  Mobility Overal bed mobility: Modified Independent General bed mobility comments: use of rails and bed features Transfers Overall transfer level: Needs assistance Equipment used: Rolling walker (2 wheels) Transfers: Sit to/from Stand Sit to Stand: Min assist, Contact guard assist Bed to/from chair/wheelchair/BSC transfer type:: Step pivot Step pivot transfers: Min assist General transfer comment: min A to steady on initial rise, able to complete x3 from chair with CGA for safety and light cues for hand placement Ambulation/Gait Ambulation/Gait assistance: Min assist Gait Distance (Feet): 30 Feet Assistive device: Rolling walker (2 wheels) Gait Pattern/deviations:  (hop to) General Gait Details: min A to maintain balance, cues for pacing with x1 standing rest break needed for UE fatigue. Gait velocity: decr   ADL: ADL Overall ADL's : Needs assistance/impaired Eating/Feeding: Independent, Sitting Grooming: Set up, Sitting Upper Body Bathing: Set up, Sitting Lower Body Bathing: Minimal assistance, Sitting/lateral leans Upper Body Dressing : Set up, Sitting Lower Body Dressing: Minimal assistance, Sitting/lateral leans Lower Body Dressing Details (indicate cue type and reason): sitting EOB, pt donned right non slip sock without assistance. Pt educated on donning technique for limb protector with hands on practice. OT provided stability of protector only while pt managed straps. Toilet Transfer: Minimal assistance, Stand-pivot, BSC/3in1 Toileting- Clothing Manipulation and Hygiene: Set up, Sitting/lateral lean   Cognition: Cognition Orientation Level: Oriented X4 Cognition Arousal: Alert Behavior During Therapy: WFL for tasks assessed/performed     Blood pressure 135/72, pulse 82, temperature 98.7 F (37.1 C),  temperature source Oral, resp. rate 20, height 5\' 9"  (1.753 m), weight 90.9 kg, SpO2 97%. Physical Exam Vitals and nursing note reviewed.  Constitutional:      Appearance: Normal appearance.  HENT:     Head: Normocephalic.     Right Ear: External ear normal.     Left Ear: External ear normal.     Nose: Nose normal.     Mouth/Throat:     Mouth: Mucous membranes are moist.  Eyes:     Extraocular Movements: Extraocular movements intact.     Pupils: Pupils are equal, round, and reactive to light.  Cardiovascular:     Rate and Rhythm: Normal rate and regular rhythm.     Pulses: Normal pulses.  Pulmonary:     Effort: Pulmonary effort is normal. No respiratory distress.     Breath sounds: No wheezing or rales.  Abdominal:     General: There is no distension.     Palpations: Abdomen is soft.     Tenderness: There is no abdominal tenderness.  Musculoskeletal:     Cervical back: Normal range of motion.     Comments: Right BKA still swollen and tender.  Skin:    Comments: BKA with staples intact, warm, erythema and taut with some bleeding from an area along incision line laterally. Erythema extends medially to upper thigh which is also warm to touch.   Neurological:     Mental Status: He is alert.     Comments: Alert and oriented x 3. Normal insight and awareness. Intact Memory. Normal language and speech. Cranial nerve exam unremarkable. MMT: BUE 5/5 prox to distal. LLE 3/5 HF and KE. RLE 4/5 prox to distal. Sensory exam normal for light touch and pain in all 4 limbs. No limb ataxia or cerebellar signs. No abnormal tone appreciated.     Psychiatric:        Mood and Affect: Mood normal.        Behavior: Behavior normal.  Lab Results Last 48 Hours  No results found for this or any previous visit (from the past 48 hours).     Imaging Results (Last 48 hours)  No results found.         Blood pressure 135/72, pulse 82, temperature 98.7 F (37.1 C), temperature source Oral,  resp. rate 20, height 5\' 9"  (1.753 m), weight 90.9 kg, SpO2 97%.   Medical Problem List and Plan: 1. Functional deficits secondary to PAD and left BKA             -patient may shower if left BKA site is covered             -ELOS/Goals: 10-12 days with supervision goals for PT and OT             -Limb guard when OOB 2.  Antithrombotics: -DVT/anticoagulation:  Pharmaceutical: Heparin  5000U q8h             -antiplatelet therapy: ASA 81mg  daily 3. Pain Management: robaxin PRN. Oxy-APAP 5-325mg  1-2 tabs q4h PRN -Hasn't been using much morphine  in the last 24hrs, d/c for now, PRN POs 4. Mood/Behavior/Sleep/hx of PTSD and depression/anxiety: LCSW to follow for evaluation and support.              -antipsychotic agents: N/A             -Continue Bupropion  150mg  XR daily, Duloxetine  30mg  daily. Also has service dog.              -Melatonin 5mg  PRN 5. Neuropsych/cognition: This patient is capable of making decisions on his own behalf. 6. Skin/Wound Care:  Routine pressure relief measures.  7. Fluids/Electrolytes/Nutrition: Monitor I&O, routine labs. Continue supplements and vitamins.  8. HTN: home meds Lisinopril  10mg  daily             -BPs have been fine postop, monitor with increased mobility 9. Cellulitis?: Residual limb edematous and tight with erythema extendin medially to mid thigh. Keflex initiated by PA for wound prophylaxis.              -will continue for now    10. CAD s/p PCI: Monitor for symptoms with increase in activity  11. 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 Constipation: Senna added  13.HLD: continue Atorvastatin  80mg  daily 14. BPH: continue Flomax  0.4mg  daily 15. ABLA: Monitor for signs of bleeding. Recheck CBC in am.  16. Hyponatremia: Recheck BMET in am.  17. GERD/GI ppx: Managed with Protonix  40mg  daily     Zelda Hickman, PA-C 11/26/2023   I have personally performed a face to face diagnostic evaluation of this patient and formulated the key components  of the plan.  Additionally, I have personally reviewed laboratory data, imaging studies, as well as relevant notes and concur with the physician assistant's documentation above.  The patient's status has not changed from the original H&P.  Any changes in documentation from the acute care chart have been noted above.  Rawland Caddy, MD, [facility]

## 2023-11-26 NOTE — TOC Transition Note (Signed)
 Transition of Care (TOC) - Discharge Note Sherin Dingwall RN, BSN Transitions of Care Unit 4E- RN Case Manager See Treatment Team for direct phone #   Patient Details  Name: [name] [name] MRN: [id number] Date of Birth: 1945/07/21  [facility]) CM/SW Contact:  Rox Cope, RN Phone Number: 11/26/2023, 3:01 PM   Clinical Narrative:    CM notified by CIR liaison that pt has bed available for admit to [facility] INPT rehab today.  [facility] liaison notified as well and will follow pt post rehab stay for any HH needs on return home. [facility] office made referral to [facility] for [facility] needs.   Pt to transition to [facility] INPT rehab this afternoon.    Final next level of care: IP Rehab Facility Barriers to Discharge: Barriers Resolved   Patient Goals and CMS Choice Patient states their goals for this hospitalization and ongoing recovery are:: rehab then return home CMS Medicare.gov Compare Post Acute Care list provided to:: Patient Choice offered to / list presented to : Patient      Discharge Placement                 [facility] INPT rehab      Discharge Plan and Services Additional resources added to the After Visit Summary for     Discharge Planning Services: CM Consult Post Acute Care Choice: IP Rehab          DME Arranged: N/A DME Agency: NA       HH Arranged: NA HH Agency: NA        Social Drivers of Health (SDOH) Interventions SDOH Screenings   Food Insecurity: No Food Insecurity (11/21/2023)  Housing: Low Risk  (11/21/2023)  Transportation Needs: No Transportation Needs (11/21/2023)  Utilities: Not At Risk (11/21/2023)  Financial Resource Strain: Low Risk  (08/25/2022)   Received from [facility]  Social Connections: Moderately Isolated (11/21/2023)  Stress: No Stress Concern Present (08/25/2022)   [facility]  Tobacco Use: Medium Risk (11/21/2023)     Readmission Risk Interventions    11/26/2023    3:01  PM  Readmission Risk Prevention Plan  Medication Screening Complete  Transportation Screening Complete

## 2023-11-26 NOTE — Progress Notes (Signed)
 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 PMH: MI, CAD, anxiety, depression, HLD, PTSD, CVA.    PT Comments  Pt resting in bed on arrival and agreeable to session with continued progress towards acute goals. Pt able to come to sitting EOB without assist and don limb protector with assist for set up and correct placement. Pt requiring min A to steady on initial rise to stand with cues for weight shift over RLE. Pt able to perform x3 from chair at end of session with CGA for safety. Pt progressing ambulation with RW for support and up to min A to maintain balance, as pt with increased RLE flexion with fatigue. Pt with good tolerance for LLE seated exercises for increased L knee extension. Pt continues to be appropriate for intensive inpatient follow-up therapy, >3 hours/day, will continue to follow acutely.    If plan is discharge home, recommend the following: A little help with walking and/or transfers;A little help with bathing/dressing/bathroom;Assist for transportation;Help with stairs or ramp for entrance;Assistance with cooking/housework   Can travel by Training and development officer (2 wheels)    Recommendations for Other Services       Precautions / Restrictions Precautions Precautions: Fall Recall of Precautions/Restrictions: Intact Required Braces or Orthoses: Other Brace Other Brace: L limb protector Restrictions Weight Bearing Restrictions Per Provider Order: Yes LLE Weight Bearing Per Provider Order: Non weight bearing     Mobility  Bed Mobility Overal bed mobility: Modified Independent             General bed mobility comments: use of rails and bed features    Transfers Overall transfer level: Needs assistance Equipment  used: Rolling walker (2 wheels) Transfers: Sit to/from Stand Sit to Stand: Min assist, Contact guard assist           General transfer comment: min A to steady on initial rise, able to complete x3 from chair with CGA for safety and light cues for hand placement    Ambulation/Gait Ambulation/Gait assistance: Min assist Gait Distance (Feet): 30 Feet Assistive device: Rolling walker (2 wheels)   Gait velocity: decr     General Gait Details: min A to maintain balance, cues for pacing with x1 standing rest break needed for UE fatigue.   Stairs             Wheelchair Mobility     Tilt Bed    Modified Rankin (Stroke Patients Only)       Balance Overall balance assessment: Needs assistance Sitting-balance support: No upper extremity supported, Feet supported Sitting balance-Leahy Scale: Good Sitting balance - Comments: sitting EOB   Standing balance support: Bilateral upper extremity supported, During functional activity, Reliant on assistive device for balance Standing balance-Leahy Scale: Poor Standing balance comment: reliant on UE support                            Communication Communication Communication: Impaired Factors Affecting Communication: Hearing impaired  Cognition Arousal: Alert Behavior During Therapy: WFL for tasks assessed/performed                             Following commands: Intact  Cueing Cueing Techniques: Verbal cues  Exercises General Exercises - Lower Extremity Long Arc Quad: AROM, Left, 10 reps, Seated Hip Flexion/Marching: AROM, Left, 10 reps, Seated    General Comments General comments (skin integrity, edema, etc.): Left residual limb noted redness. Nursing aware and reports there has also been draining to inicision site which is being monitored.      Pertinent Vitals/Pain Pain Assessment Pain Assessment: Faces Faces Pain Scale: Hurts a little bit Pain Location: L residual limb Pain  Descriptors / Indicators: Grimacing, Discomfort Pain Intervention(s): Monitored during session, Limited activity within patient's tolerance, Repositioned    Home Living                          Prior Function            PT Goals (current goals can now be found in the care plan section) Acute Rehab PT Goals PT Goal Formulation: With patient/family Time For Goal Achievement: 12/06/23 Progress towards PT goals: Progressing toward goals    Frequency    Min 3X/week      PT Plan      Co-evaluation              AM-PAC PT "6 Clicks" Mobility   Outcome Measure  Help needed turning from your back to your side while in a flat bed without using bedrails?: A Little Help needed moving from lying on your back to sitting on the side of a flat bed without using bedrails?: A Little Help needed moving to and from a bed to a chair (including a wheelchair)?: A Little Help needed standing up from a chair using your arms (e.g., wheelchair or bedside chair)?: A Little Help needed to walk in hospital room?: A Lot Help needed climbing 3-5 steps with a railing? : Total 6 Click Score: 15    End of Session Equipment Utilized During Treatment: Gait belt Activity Tolerance: Patient tolerated treatment well Patient left: in chair;with call bell/phone within reach Nurse Communication: Mobility status PT Visit Diagnosis: Unsteadiness on feet (R26.81);Other abnormalities of gait and mobility (R26.89);Muscle weakness (generalized) (M62.81)     Time: 3086-5784 PT Time Calculation (min) (ACUTE ONLY): 23 min  Charges:    $Gait Training: 8-22 mins $Therapeutic Exercise: 8-22 mins PT General Charges $$ ACUTE PT VISIT: 1 Visit                     Carlynn Leduc R. PTA Acute Rehabilitation Services Office: [phone]   Agapito Horseman 11/26/2023, 11:26 AM

## 2023-11-26 NOTE — Progress Notes (Signed)
 Inpatient Rehab Admissions Coordinator:   CIR following but I do not have a bed for this pt due to low staffing on CIR. I will follow up tomorrow and am hopeful to have a bed for him tomorrow.   Wandalee Gust, MS, CCC-SLP Rehab Admissions Coordinator  [phone] (celll) [phone] (office)

## 2023-11-26 NOTE — Progress Notes (Signed)
 Inpatient Rehabilitation Admission Medication Review by a Pharmacist  A complete drug regimen review was completed for this patient to identify any potential clinically significant medication issues.  High Risk Drug Classes Is patient taking? Indication by Medication  Antipsychotic No   Anticoagulant Yes Heparin  sq - VTE ppx  Antibiotic No   Opioid Yes Percocet prn pain  Antiplatelet No   Hypoglycemics/insulin No   Vasoactive Medication Yes Tamsulosin  - BPH  Chemotherapy No   Other Yes Methocarbamol prn spasms Ondansetron  prn N/V Pantoprazole  - Reflux Atorvastatin  - HLD Bupropion , Duloxetine  - mood     Type of Medication Issue Identified Description of Issue Recommendation(s)  Drug Interaction(s) (clinically significant)     Duplicate Therapy     Allergy     No Medication Administration End Date     Incorrect Dose     Additional Drug Therapy Needed     Significant med changes from prior encounter (inform family/care partners about these prior to discharge).    Other       Clinically significant medication issues were identified that warrant physician communication and completion of prescribed/recommended actions by midnight of the next day:  No  Name of provider notified for urgent issues identified:   Provider Method of Notification:     Pharmacist comments: None  Time spent performing this drug regimen review (minutes):  20 minutes  Thank you. Lennice Quivers, PharmD

## 2023-11-26 NOTE — Progress Notes (Signed)
 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 PMH: MI, CAD, anxiety, depression, HLD, PTSD, CVA.   OT comments  Pt agreeable to participate in OT treatment session. Wife present in room visiting. Focus of session on pt education regarding use and management of limb protector and LE ROM exercises while seated EOB in order to increase LB ADL tasks and functional transfers. Pt verbalized and demonstrated understanding as able. Continues to be appropriate for intensive inpatient follow-up therapy, >3 hours/day. OT will continue to follow patient acutely.        If plan is discharge home, recommend the following:  A little help with walking and/or transfers;A little help with bathing/dressing/bathroom;Assistance with cooking/housework;Assist for transportation;Help with stairs or ramp for entrance   Equipment Recommendations  Other (comment) (RW)    Recommendations for Other Services Rehab consult    Precautions / Restrictions Precautions Precautions: Fall Recall of Precautions/Restrictions: Intact Required Braces or Orthoses: Other Brace Other Brace: L limb protector Restrictions Weight Bearing Restrictions Per Provider Order: Yes LLE Weight Bearing Per Provider Order: Non weight bearing       Mobility Bed Mobility Overal bed mobility: Modified Independent           Balance Overall balance assessment: Needs assistance Sitting-balance support: No upper extremity supported, Feet supported Sitting balance-Leahy Scale: Good Sitting balance - Comments: sitting EOB          ADL either performed or assessed with clinical judgement   ADL      Lower Body Dressing: Minimal assistance;Sitting/lateral leans Lower Body Dressing Details (indicate cue type and reason): sitting  EOB, pt donned right non slip sock without assistance. Pt educated on donning technique for limb protector with hands on practice. OT provided stability of protector only while pt managed straps.                 Communication Communication Communication: Impaired Factors Affecting Communication: Hearing impaired   Cognition Arousal: Alert Behavior During Therapy: WFL for tasks assessed/performed Cognition: No apparent impairments            Following commands: Intact        Cueing   Cueing Techniques: Verbal cues  Exercises Amputee Exercises Hip Flexion/Marching: AROM, Both, 10 reps, Seated Knee Flexion: AROM, Left, 10 reps, Seated Knee Extension: AROM, Left, 10 reps, Seated Straight Leg Raises: AROM, Left, 10 reps, Seated       General Comments Left residual limb noted redness. Nursing aware and reports there has also been draining to inicision site which is being monitored. Pt educated on purpose of limb protector, donning/doffing technique, working on knee extension with no pillow under knee while in bed. Referred pt to HEP handout on tray table for exercise completion provided by PT.    Pertinent Vitals/ Pain       Pain Assessment Pain Assessment: Faces Faces Pain Scale: Hurts a little bit Pain Location: L residual limb Pain Descriptors / Indicators: Grimacing, Discomfort Pain Intervention(s): Monitored during session         Frequency  Min 2X/week        Progress Toward Goals  OT Goals(current goals can now be found in the care plan section)  Progress towards OT goals: Progressing toward goals  AM-PAC OT "6 Clicks" Daily Activity     Outcome Measure   Help from another person eating meals?: None Help from another person taking care of personal grooming?: A Little Help from another person toileting, which includes using toliet, bedpan, or urinal?: A Little Help from another person bathing (including washing, rinsing, drying)?: A  Little Help from another person to put on and taking off regular upper body clothing?: A Little Help from another person to put on and taking off regular lower body clothing?: A Little 6 Click Score: 19    End of Session    OT Visit Diagnosis: Unsteadiness on feet (R26.81);Pain Pain - Right/Left: Left Pain - part of body: Leg   Activity Tolerance Patient tolerated treatment well   Patient Left in bed;with call bell/phone within reach;with bed alarm set;with family/visitor present           Time: 9147-8295 OT Time Calculation (min): 21 min  Charges: OT General Charges $OT Visit: 1 Visit OT Treatments $Therapeutic Activity: 8-22 mins  [name] [name], OTR/L,CBIS  Supplemental OT - [facility] and [facility] Secure Chat Preferred    [name] [name], [name] [name] 11/26/2023, 11:03 AM

## 2023-11-26 NOTE — Progress Notes (Addendum)
  Progress Note    11/26/2023 8:04 AM 5 Days Post-Op  Subjective:  no complaints, ready for rehab    Vitals:   11/26/23 0542 11/26/23 0748  BP: (!) 140/76 119/73  Pulse: 90 96  Resp: 18 17  Temp: 98.4 F (36.9 C) 98.4 F (36.9 C)  SpO2: 97% 98%    Physical Exam: General:  sitting up in bed, A&O x3 Lungs:  nonlabored Incisions:  L BKA intact and dry   CBC    Component Value Date/Time   WBC 9.4 11/23/2023 0314   RBC 3.42 (L) 11/23/2023 0314   HGB 10.7 (L) 11/23/2023 0314   HCT 30.8 (L) 11/23/2023 0314   PLT 157 11/23/2023 0314   MCV 90.1 11/23/2023 0314   MCH 31.3 11/23/2023 0314   MCHC 34.7 11/23/2023 0314   RDW 11.9 11/23/2023 0314   LYMPHSABS 2.0 06/28/2020 1405   MONOABS 0.5 06/28/2020 1405   EOSABS 0.1 06/28/2020 1405   BASOSABS 0.0 06/28/2020 1405    BMET    Component Value Date/Time   NA 133 (L) 11/23/2023 0314   K 3.9 11/23/2023 0314   CL 99 11/23/2023 0314   CO2 27 11/23/2023 0314   GLUCOSE 134 (H) 11/23/2023 0314   BUN 11 11/23/2023 0314   CREATININE 0.66 11/23/2023 0314   CALCIUM  8.0 (L) 11/23/2023 0314   GFRNONAA >60 11/23/2023 0314   GFRAA >60 07/31/2019 1241    INR    Component Value Date/Time   INR 1.1 11/13/2023 1100     Intake/Output Summary (Last 24 hours) at 11/26/2023 0804 Last data filed at 11/26/2023 0500 Gross per 24 hour  Intake --  Output 500 ml  Net -500 ml      Assessment/Plan:  78 y.o. male is 5 days post op, s/p: L BKA  -He is doing well this morning without any complaints -L BKA is well appearing and without drainage -Pain is controlled on current regime -He is mobilizing well with plans for CIR. Possible d/c to CIR today if bed is available   Deneise Finlay, PA-C [facility] [phone] 11/26/2023 8:04 AM   I have seen and evaluated the patient. I agree with the PA note as documented above. BKA looks good.  CIR today.  Young Hensen, MD [facility]  [place] Office: [phone]

## 2023-11-26 NOTE — Progress Notes (Signed)
 Inpatient Rehab Admissions Coordinator:    I have a CIR bed for this pt. After all. RN may call report to [phone].  Pt. To admit to CIR  for an estimated 7-10 days with the goal of discharging home with 24/7 assist from wife.   Wandalee Gust, MS, CCC-SLP Rehab Admissions Coordinator  [phone] (celll) [phone] (office)

## 2023-11-26 NOTE — Progress Notes (Signed)
 PMR Admission Coordinator Pre-Admission Assessment   Patient: [name] [name] is an 78 y.o., male MRN: [id number] DOB: Nov 08, 1945 Height: 5\' 9"  (175.3 cm) Weight: 90.9 kg   Insurance Information HMO:     PPO:      PCP:      IPA:      80/20:      OTHER:  PRIMARY: [facility]      Policy#: [id number]      Subscriber: patient CM Name: Cassie      Phone#: [phone], email: [email or web address]     Fax#: [phone] Pre-Cert#: [id number]      Employer:  Benefits:  Phone #: [phone]     Name:  Eff. Date: 12/04/22     Deduct:       Out of Pocket Max:       Life Max:  CIR: per [facility]      SNF:  Outpatient:      Co-Pay:  Home Health:       Co-Pay:  DME:      Co-Pay:  Providers: in-network SECONDARY: Medicare Part A      Policy#: [id number]     Phone#:    Artist:       Phone#:    The "Data Collection Information Summary" for patients in Inpatient Rehabilitation Facilities with attached "Privacy Act Statement-Health Care Records" was provided and verbally reviewed with: Patient   Emergency Contact Information Contact Information       Name Relation Home Work Mobile    [name],[name] Spouse [phone]   [phone]         Other Contacts       Name Relation Home Work [place] Daughter [phone]   [phone]           Current Medical History  Patient Admitting Diagnosis: Left BKA   History of Present Illness: [name] [name] is a 78 y.o. male with past medical history of MI, CAD, anxiety, depression, HLD, PTSD, CVA. He was admitted vascular service at [facility] on 11/21/23 due to non healing wounds and intractable pain. 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 Pt. Tolerated well and was seen by PT/OT post operatively. They recommended CIR to assist return to PLOF    Patient's medical record from [facility] has been reviewed by the rehabilitation admission coordinator and physician.   Past Medical History      Past  Medical History:  Diagnosis Date   Anxiety     Cancer (HCC)      skin cancer   Carpal tunnel syndrome     Coronary artery disease      DES x4   Depression     Hepatitis      Hepatitis A during [place] - received treatment   Hypercholesteremia     Hypertension     Mononeuropathy      Left Leg   Myocardial infarction (HCC) 2010ish   Peripheral vascular disease (HCC)     Pneumonia     PTSD (post-traumatic stress disorder)     PTSD (post-traumatic stress disorder)     Sleep apnea      doesnt use CPAP. Sinus surgery fixed OSA issues   Stroke (HCC)      slight weakness left- 85- 90 % returned          Has the patient had major surgery during 100 days prior to admission? No   Family History   family history includes Heart  disease in his mother; Lung cancer in his father.   Current Medications  Current Medications    Current Facility-Administered Medications:    alum & mag hydroxide-simeth (MAALOX/MYLANTA) 200-200-20 MG/5ML suspension 15-30 mL, 15-30 mL, Oral, Q2H PRN, Rhyne, Samantha J, PA-C   aspirin  EC tablet 81 mg, 81 mg, Oral, Daily, Rhyne, Samantha J, PA-C, 81 mg at 11/24/23 1610   atorvastatin  (LIPITOR) tablet 80 mg, 80 mg, Oral, Daily, Rhyne, Samantha J, PA-C, 80 mg at 11/24/23 9604   bisacodyl  (DULCOLAX) suppository 10 mg, 10 mg, Rectal, Daily PRN, Rhyne, Samantha J, PA-C   buPROPion  (WELLBUTRIN  XL) 24 hr tablet 150 mg, 150 mg, Oral, Daily, Rhyne, Samantha J, PA-C, 150 mg at 11/24/23 5409   cyanocobalamin  (VITAMIN B12) tablet 500 mcg, 500 mcg, Oral, Daily, Rhyne, Samantha J, PA-C, 500 mcg at 11/24/23 8119   diphenhydrAMINE  (BENADRYL ) 25 mg in sodium chloride  0.9 % 50 mL IVPB, 25 mg, Intravenous, Q4H PRN, Hazeline Lister, Emma M, PA-C, Last Rate: 100 mL/hr at 11/22/23 1536, 25 mg at 11/22/23 1536   docusate sodium  (COLACE) capsule 100 mg, 100 mg, Oral, Daily, Rhyne, Samantha J, PA-C, 100 mg at 11/24/23 1478   DULoxetine  (CYMBALTA ) DR capsule 30 mg, 30 mg, Oral, Daily, Rhyne,  Samantha J, PA-C, 30 mg at 11/24/23 2956   guaiFENesin -dextromethorphan  (ROBITUSSIN DM) 100-10 MG/5ML syrup 15 mL, 15 mL, Oral, Q4H PRN, Rhyne, Samantha J, PA-C   heparin  injection 5,000 Units, 5,000 Units, Subcutaneous, Q8H, Rhyne, Samantha J, PA-C, 5,000 Units at 11/24/23 0509   hydrALAZINE  (APRESOLINE ) injection 5 mg, 5 mg, Intravenous, Q20 Min PRN, Rhyne, Samantha J, PA-C   labetalol  (NORMODYNE ) injection 10 mg, 10 mg, Intravenous, Q10 min PRN, Rhyne, Samantha J, PA-C   magnesium  sulfate IVPB 2 g 50 mL, 2 g, Intravenous, Daily PRN, Rhyne, Samantha J, PA-C   metoprolol  tartrate (LOPRESSOR ) injection 2-5 mg, 2-5 mg, Intravenous, Q2H PRN, Rhyne, Samantha J, PA-C   morphine  (PF) 2 MG/ML injection 2 mg, 2 mg, Intravenous, Q2H PRN, Rhyne, Samantha J, PA-C, 2 mg at 11/24/23 2130   ondansetron  (ZOFRAN ) injection 4 mg, 4 mg, Intravenous, Q6H PRN, Rhyne, Samantha J, PA-C   oxyCODONE -acetaminophen  (PERCOCET/ROXICET) 5-325 MG per tablet 1-2 tablet, 1-2 tablet, Oral, Q4H PRN, Rhyne, Samantha J, PA-C, 2 tablet at 11/23/23 1640   pantoprazole  (PROTONIX ) EC tablet 40 mg, 40 mg, Oral, Daily, Rhyne, Samantha J, PA-C, 40 mg at 11/24/23 8657   phenol (CHLORASEPTIC) mouth spray 1 spray, 1 spray, Mouth/Throat, PRN, Rhyne, Samantha J, PA-C   polyethylene glycol (MIRALAX  / GLYCOLAX ) packet 17 g, 17 g, Oral, Daily PRN, Rhyne, Samantha J, PA-C   polyvinyl alcohol  (LIQUIFILM TEARS) 1.4 % ophthalmic solution 1 drop, 1 drop, Both Eyes, TID PRN, Rhyne, Samantha J, PA-C   potassium chloride  SA (KLOR-CON  M) CR tablet 20-40 mEq, 20-40 mEq, Oral, Daily PRN, Rhyne, Samantha J, PA-C   tamsulosin  (FLOMAX ) capsule 0.4 mg, 0.4 mg, Oral, Daily, Rhyne, Samantha J, PA-C, 0.4 mg at 11/24/23 8469     Patients Current Diet:  Diet Order                  Diet Heart Room service appropriate? Yes; Fluid consistency: Thin  Diet effective now                         Precautions / Restrictions Precautions Precautions: Fall Other  Brace: L limb protector Restrictions Weight Bearing Restrictions Per Provider Order: Yes LLE Weight Bearing Per Provider Order:  Non weight bearing (bka)    Has the patient had 2 or more falls or a fall with injury in the past year? Yes   Prior Activity Level Community (5-7x/wk): Pt. active in the community PTA   Prior Functional Level Self Care: Did the patient need help bathing, dressing, using the toilet or eating? Independent   Indoor Mobility: Did the patient need assistance with walking from room to room (with or without device)? Independent   Stairs: Did the patient need assistance with internal or external stairs (with or without device)? Independent   Functional Cognition: Did the patient need help planning regular tasks such as shopping or remembering to take medications? Independent   Patient Information Are you of Hispanic, Latino/a,or Spanish origin?: A. No, not of Hispanic, Latino/a, or Spanish origin What is your race?: A. White Do you need or want an interpreter to communicate with a doctor or health care staff?: 0. No   Patient's Response To:  Health Literacy and Transportation Is the patient able to respond to health literacy and transportation needs?: Yes Health Literacy - How often do you need to have someone help you when you read instructions, pamphlets, or other written material from your doctor or pharmacy?: Never In the past 12 months, has lack of transportation kept you from medical appointments or from getting medications?: No In the past 12 months, has lack of transportation kept you from meetings, work, or from getting things needed for daily living?: No   Journalist, newspaper / Equipment Home Equipment: Occupational hygienist (4 wheels), [employer] - single point, Information systems manager, Art gallery manager, Wheelchair - manual, Other (comment) ([facility] is getting [facility] transport chair)   Prior Device Use: Indicate devices/aids used by the patient prior to current illness, exacerbation or  injury? None of the above   Current Functional Level Cognition   Orientation Level: Oriented X4    Extremity Assessment (includes Sensation/Coordination)   Upper Extremity Assessment: Defer to OT evaluation  Lower Extremity Assessment: LLE deficits/detail LLE Deficits / Details: Limited knee flexion ROM. Able to SLR and activate quad LLE Sensation: WNL     ADLs   Overall ADL's : Needs assistance/impaired Eating/Feeding: Independent, Sitting Grooming: Set up, Sitting Upper Body Bathing: Set up, Sitting Lower Body Bathing: Minimal assistance, Sitting/lateral leans Upper Body Dressing : Set up, Sitting Lower Body Dressing: Minimal assistance, Bed level Lower Body Dressing Details (indicate cue type and reason): shorts Toilet Transfer: Minimal assistance, Stand-pivot, BSC/3in1 Toileting- Clothing Manipulation and Hygiene: Set up, Sitting/lateral lean     Mobility   Overal bed mobility: Modified Independent General bed mobility comments: + use of bed rails and HOB elevated     Transfers   Overall transfer level: Needs assistance Equipment used: Rolling walker (2 wheels) Transfers: Sit to/from Stand Sit to Stand: Contact guard assist Bed to/from chair/wheelchair/BSC transfer type:: Step pivot Step pivot transfers: Min assist General transfer comment: CGA for safety, good recall for hand placement     Ambulation / Gait / Stairs / Wheelchair Mobility   Ambulation/Gait Ambulation/Gait assistance: Editor, commissioning (Feet): 24 Feet Assistive device: Rolling walker (2 wheels) Gait Pattern/deviations:  (hop to) General Gait Details: min A to maintain balanace especailly wih last ~5' as pt with flexed RLE in stance due to fatigue, cues to keep RW in contact with floor Gait velocity: decr     Posture / Balance Balance Overall balance assessment: Needs assistance Sitting-balance support: No upper extremity supported Sitting balance-Leahy Scale: Good Standing balance support:  Bilateral upper extremity supported, During functional activity, Reliant on assistive device for balance Standing balance-Leahy Scale: Poor Standing balance comment: reliant on UE support     Special needs/care consideration Skin leg/left    Previous Home Environment (from acute therapy documentation) Living Arrangements: Spouse/significant other Available Help at Discharge: Family, Available 24 hours/day, Other (Comment) (wife cannot provide physical assist) Type of Home: House Home Layout: One level Home Access: Stairs to enter, Other (comment) ([facility] is measuring for a ramp) Entrance Stairs-Number of Steps: 2 Bathroom Shower/Tub: Psychologist, counselling, Engineer, manufacturing systems: Standard Bathroom Accessibility: Yes How Accessible: Accessible via walker, Accessible via wheelchair Home Care Services: No   Discharge Living Setting Plans for Discharge Living Setting: Patient's home, House Type of Home at Discharge: House Discharge Home Layout: One level Discharge Home Access: Stairs to enter Entrance Stairs-Rails: Right, Left Entrance Stairs-Number of Steps: 2 Discharge Bathroom Shower/Tub: Walk-in shower Discharge Bathroom Toilet: Standard Discharge Bathroom Accessibility: Yes How Accessible: Accessible via walker, Accessible via wheelchair Does the patient have any problems obtaining your medications?: No   Social/Family/Support Systems Patient Roles: Spouse Contact Information: [phone] Anticipated Caregiver: [name] Anticipated Caregiver's Contact Information: Min A Ability/Limitations of Caregiver: 24/7 Caregiver Availability: 24/7 Discharge Plan Discussed with Primary Caregiver: Yes Is Caregiver In Agreement with Plan?: Yes Does Caregiver/Family have Issues with Lodging/Transportation while Pt is in Rehab?: Yes   Goals Patient/Family Goal for Rehab: PT/OT Supervision Expected length of stay: 10-12 days Pt/Family Agrees to Admission and willing to participate:  Yes Program Orientation Provided & Reviewed with Pt/Caregiver Including Roles  & Responsibilities: Yes   Decrease burden of Care through IP rehab admission: NA   Possible need for SNF placement upon discharge: Not anticipated   Patient Condition: I have reviewed medical records from [facility], spoken with CM, and patient. I met with patient at the bedside for inpatient rehabilitation assessment.  Patient will benefit from ongoing PT and OT, can actively participate in 3 hours of therapy a day 5 days of the week, and can make measurable gains during the admission.  Patient will also benefit from the coordinated team approach during an Inpatient Acute Rehabilitation admission.  The patient will receive intensive therapy as well as Rehabilitation physician, nursing, social worker, and care management interventions.  Due to safety, skin/wound care, disease management, medication administration, pain management, and patient education the patient requires 24 hour a day rehabilitation nursing.  The patient is currently CGA-Min A with mobility and Set up-Min A with basic ADLs.  Discharge setting and therapy post discharge at home with home health is anticipated.  Patient has agreed to participate in the Acute Inpatient Rehabilitation Program and will admit today.   Preadmission Screen Completed By:  Dorena Gander, with updates by Amiel Kalata, 11/24/2023 9:48 AM ______________________________________________________________________   Discussed status with Dr. Rachel Budds   on 11/26/23  at 10:01 AM and received approval for admission today.   Admission Coordinator:  Dorena Gander, CCC-SLP, time 10:01 AM/Date 11/26/23     Assessment/Plan: Diagnosis: left BKA Does the need for close, 24 hr/day Medical supervision in concert with the patient's rehab needs make it unreasonable for this patient to be served in a less intensive setting? Yes Co-Morbidities requiring supervision/potential  complications: MI. CAD, depression, ptsd, prior cva Due to bladder management, bowel management, safety, skin/wound care, disease management, medication administration, pain management, and patient education, does the patient require 24 hr/day rehab nursing? Yes Does the patient require coordinated care of a  physician, rehab nurse, PT, OT to address physical and functional deficits in the context of the above medical diagnosis(es)? Yes Addressing deficits in the following areas: balance, endurance, locomotion, strength, transferring, bowel/bladder control, bathing, dressing, feeding, grooming, toileting, and psychosocial support Can the patient actively participate in an intensive therapy program of at least 3 hrs of therapy 5 days a week? Yes The potential for patient to make measurable gains while on inpatient rehab is excellent Anticipated functional outcomes upon discharge from inpatient rehab: supervision PT, supervision OT, n/a SLP Estimated rehab length of stay to reach the above functional goals is: 10-12 days Anticipated discharge destination: Home 10. Overall Rehab/Functional Prognosis: excellent     MD Signature: Rawland Caddy, MD, [facility] Physical Medicine & Rehabilitation Medical Director Rehabilitation Services 11/26/2023

## 2023-11-26 NOTE — Plan of Care (Signed)

## 2023-11-27 DIAGNOSIS — Z89512 Acquired absence of left leg below knee: Secondary | ICD-10-CM | POA: Diagnosis not present

## 2023-11-27 DIAGNOSIS — L03116 Cellulitis of left lower limb: Secondary | ICD-10-CM | POA: Diagnosis not present

## 2023-11-27 DIAGNOSIS — I739 Peripheral vascular disease, unspecified: Secondary | ICD-10-CM | POA: Diagnosis not present

## 2023-11-27 DIAGNOSIS — I10 Essential (primary) hypertension: Secondary | ICD-10-CM | POA: Diagnosis not present

## 2023-11-27 LAB — CBC WITH DIFFERENTIAL/PLATELET
Abs Immature Granulocytes: 0.04 10*3/uL (ref 0.00–0.07)
Basophils Absolute: 0 10*3/uL (ref 0.0–0.1)
Basophils Relative: 0 %
Eosinophils Absolute: 0.1 10*3/uL (ref 0.0–0.5)
Eosinophils Relative: 2 %
HCT: 30.9 % — ABNORMAL LOW (ref 39.0–52.0)
Hemoglobin: 10.8 g/dL — ABNORMAL LOW (ref 13.0–17.0)
Immature Granulocytes: 1 %
Lymphocytes Relative: 19 %
Lymphs Abs: 1.4 10*3/uL (ref 0.7–4.0)
MCH: 30.9 pg (ref 26.0–34.0)
MCHC: 35 g/dL (ref 30.0–36.0)
MCV: 88.3 fL (ref 80.0–100.0)
Monocytes Absolute: 0.9 10*3/uL (ref 0.1–1.0)
Monocytes Relative: 13 %
Neutro Abs: 4.6 10*3/uL (ref 1.7–7.7)
Neutrophils Relative %: 65 %
Platelets: 255 10*3/uL (ref 150–400)
RBC: 3.5 MIL/uL — ABNORMAL LOW (ref 4.22–5.81)
RDW: 11.8 % (ref 11.5–15.5)
WBC: 7 10*3/uL (ref 4.0–10.5)
nRBC: 0 % (ref 0.0–0.2)

## 2023-11-27 NOTE — Progress Notes (Signed)
 Inpatient Rehabilitation Center Individual Statement of Services  Patient Name:  [name] [name]  Date:  11/27/2023  Welcome to the Inpatient Rehabilitation Center.  Our goal is to provide you with an individualized program based on your diagnosis and situation, designed to meet your specific needs.  With this comprehensive rehabilitation program, you will be expected to participate in at least 3 hours of rehabilitation therapies Monday-Friday, with modified therapy programming on the weekends.  Your rehabilitation program will include the following services:  Physical Therapy (PT), Occupational Therapy (OT), 24 hour per day rehabilitation nursing, Care Coordinator, Rehabilitation Medicine, Nutrition Services, and Pharmacy Services  Weekly team conferences will be held on Wednesday to discuss your progress.  Your Inpatient Rehabilitation Care Coordinator will talk with you frequently to get your input and to update you on team discussions.  Team conferences with you and your family in attendance may also be held.  Expected length of stay: 7-10 days  Overall anticipated outcome: supervision/mod/I level  Depending on your progress and recovery, your program may change. Your Inpatient Rehabilitation Care Coordinator will coordinate services and will keep you informed of any changes. Your Inpatient Rehabilitation Care Coordinator's name and contact numbers are listed  below.  The following services may also be recommended but are not provided by the Inpatient Rehabilitation Center:  Driving Evaluations Home Health Rehabiltiation Services Outpatient Rehabilitation Services    Arrangements will be made to provide these services after discharge if needed.  Arrangements include referral to agencies that provide these services.  Your insurance has been verified to be:  [facility] Your primary doctor is:  [place]  Pertinent information will be shared with your doctor and your insurance company.  Inpatient  Rehabilitation Care Coordinator:  Adrianna Albee, Buzz Cass [phone] or Justine Oms  Information discussed with and copy given to patient by: Mardell Shade, 11/27/2023, 10:06 AM

## 2023-11-27 NOTE — Evaluation (Signed)
 Physical Therapy Assessment and Plan  Patient Details  Name: [name] [name] MRN: [id number] Date of Birth: 28-Mar-1946  PT Diagnosis: Abnormal posture, Abnormality of gait, Difficulty walking, Impaired sensation, and Muscle weakness Rehab Potential: Good ELOS: 10-12 days   Today's Date: 11/27/2023 PT Individual Time: 8469-6295, 2841-3244 PT Individual Time Calculation (min): 96 min, 42 min   Hospital Problem: Principal Problem:   S/P BKA (below knee amputation), left (HCC) Active Problems:   Essential hypertension   Past Medical History:  Past Medical History:  Diagnosis Date   Anxiety    Cancer (HCC)    skin cancer   Carpal tunnel syndrome    Coronary artery disease    DES x4   Depression    Hepatitis    Hepatitis A during [place] War - received treatment   Hypercholesteremia    Hypertension    Mononeuropathy    Left Leg   Myocardial infarction (HCC) 2010ish   Peripheral vascular disease (HCC)    Pneumonia    PTSD (post-traumatic stress disorder)    PTSD (post-traumatic stress disorder)    Sleep apnea    doesnt use CPAP. Sinus surgery fixed OSA issues   Stroke (HCC)    slight weakness left- 80- 90 % returned   Past Surgical History:  Past Surgical History:  Procedure Laterality Date   AMPUTATION Left 11/21/2023   Procedure: AMPUTATION BELOW KNEE;  Surgeon: Margherita Shell, MD;  Location: [facility] OR;  Service: Vascular;  Laterality: Left;  block   CARDIAC CATHETERIZATION  11/12/2021   DES stent placed   CARPAL TUNNEL RELEASE Right 06/10/2013   Procedure: RIGHT CARPAL TUNNEL RELEASE;  Surgeon: Arnie Lao, MD;  Location: [facility] OR;  Service: Orthopedics;  Laterality: Right;   CARPAL TUNNEL RELEASE Left    CATARACT EXTRACTION Bilateral    COLONOSCOPY     CORONARY ANGIOPLASTY     EXCISION HAGLUND'S DEFORMITY WITH ACHILLES TENDON REPAIR Right 06/24/2021   Procedure: RIGHT HAGLUNDS RESECTION;  Surgeon: Timothy Ford, MD;  Location: [facility] OR;  Service: Orthopedics;   Laterality: Right;   KNEE ARTHROSCOPY Right    KNEE ARTHROSCOPY Left 08/04/2019   Procedure: LEFT KNEE ARTHROSCOPY WITH PARTIAL MEDIAL MENISCECTOMY;  Surgeon: Arnie Lao, MD;  Location: [facility];  Service: Orthopedics;  Laterality: Left;   LUMBAR SPINE SURGERY     x2   NASAL SINUS SURGERY     stents  2012   TONSILLECTOMY      Assessment & Plan Clinical Impression: Patient is a 78 y.o. year old male with with a PMHx of HTN, HLD, depression/anxiety, CAD s/p PCI x4 ([facility]), PTSD, CVA w/residual L weakness, agent orange exposure, OSA s/p nasal surgeries, HOH, lumbar decompression in the past, mononeuropathy LLE with severe pain and blistering having failed multiple medical interventions and underwent L- BKA by Dr. Charlotte Cookey on 11/21/23. Post op ABLA noted with Hgb drop from 13.7 to 10.7. Therapy has been working with patient who requires CGA to min assist with ADLs and mobility. He was independent PTA admission and CIR recommended due to functional decline.    Patient currently requires min with mobility secondary to muscle weakness and muscle joint tightness, decreased cardiorespiratoy endurance, decreased coordination, and decreased sitting balance, decreased standing balance, decreased postural control, decreased balance strategies, and difficulty maintaining precautions.  Prior to hospitalization, patient was independent  with mobility and lived with Spouse Airline pilot) in a House home.  Home access is 2Stairs to enter, Other (comment) (  [facility]).  Patient will benefit from skilled PT intervention to maximize safe functional mobility, minimize fall risk, and decrease caregiver burden for planned discharge home with intermittent assist/supervision.  Anticipate patient will benefit from follow up HH at discharge.  PT - End of Session Activity Tolerance: Tolerates 30+ min activity with multiple rests Endurance Deficit: Yes PT  Assessment Rehab Potential (ACUTE/IP ONLY): Good PT Barriers to Discharge: Inaccessible home environment;Decreased caregiver support;Home environment access/layout;Weight;Weight bearing restrictions;Wound Care PT Patient demonstrates impairments in the following area(s): Balance;Edema;Endurance;Motor;Pain;Safety;Sensory;Skin Integrity PT Transfers Functional Problem(s): Bed Mobility;Bed to Chair;Car;Furniture PT Locomotion Functional Problem(s): Ambulation;Wheelchair Mobility PT Plan PT Intensity: Minimum of 1-2 x/day ,45 to 90 minutes PT Frequency: 5 out of 7 days PT Duration Estimated Length of Stay: 7-10 PT Treatment/Interventions: Ambulation/gait training;Community reintegration;DME/adaptive equipment instruction;Neuromuscular re-education;Psychosocial support;Stair training;UE/LE Strength taining/ROM;Wheelchair propulsion/positioning;Balance/vestibular training;Discharge planning;Pain management;Therapeutic Activities;Skin care/wound management;UE/LE Coordination activities;Cognitive remediation/compensation;Disease management/prevention;Functional mobility training;Patient/family education;Splinting/orthotics;Therapeutic Exercise;Visual/perceptual remediation/compensation PT Transfers Anticipated Outcome(s): mod I PT Locomotion Anticipated Outcome(s): supervision PT Recommendation Follow Up Recommendations: Outpatient PT;Home health PT Patient destination: Home Equipment Details: pt has electric and manual WC, RW, cane, BSC, shower chair   PT Evaluation Precautions/Restrictions Precautions Precautions: Fall Recall of Precautions/Restrictions: Intact Other Brace: L limb protector Restrictions Weight Bearing Restrictions Per Provider Order: Yes LLE Weight Bearing Per Provider Order: Non weight bearing Pain Interference Pain Interference Pain Effect on Sleep: 1. Rarely or not at all Pain Interference with Therapy Activities: 1. Rarely or not at all Pain Interference with Day-to-Day  Activities: 1. Rarely or not at all Home Living/Prior Functioning Home Living Available Help at Discharge: Family;Available 24 hours/day;Other (Comment) (per acute note wife unable to provide physical assist, per pt wife is able to provide physcial assistance) Type of Home: House Home Access: Stairs to enter;Other (comment) ([facility] is [facility] to [facility] a ramp) Entrance Stairs-Number of Steps: 2 Entrance Stairs-Rails: None Home Layout: One level Bathroom Shower/Tub: Walk-in shower;Tub/shower unit (pt reports he is discussing with [facility] to get the [facility] door widened) Bathroom Toilet: Handicapped height Bathroom Accessibility: Yes  Lives With: Spouse Airline pilot) Prior Function Level of Independence: Independent with basic ADLs;Independent with transfers;Independent with gait;Independent with homemaking with ambulation  Able to Take Stairs?: Yes Driving: Yes Vocation: On disability (from the [employer]) Vision/Perception  Vision - History Ability to See in Adequate Light: 0 Adequate  Cognition Overall Cognitive Status: Within Functional Limits for tasks assessed Arousal/Alertness: Awake/alert Orientation Level: Oriented X4 Year: 2025 Month: May Day of Week: Correct Sensation Sensation Light Touch: Impaired by gross assessment Additional Comments: Pt reports N&T on distal resiudal limb, and intermittent phantom sensations as though his toe is itching Motor  Motor Motor: Other (comment) Motor - Skilled Clinical Observations: L BKA  Trunk/Postural Assessment  Cervical Assessment Cervical Assessment: Exceptions to [facility] (forward head) Thoracic Assessment Thoracic Assessment: Exceptions to [facility] (rounded shoulders) Lumbar Assessment Lumbar Assessment: Exceptions to [facility] (posterior pelvic tilt) Postural Control Postural Control: Deficits on evaluation Righting Reactions: delayed on eval Protective Responses: delayed on eval  Balance Balance Balance Assessed: Yes Static Sitting  Balance Static Sitting - Balance Support: Feet supported Static Sitting - Level of Assistance: 5: Stand by assistance (supervision) Dynamic Sitting Balance Dynamic Sitting - Level of Assistance: 5: Stand by assistance (supervision) Static Standing Balance Static Standing - Balance Support: Bilateral upper extremity supported;During functional activity Static Standing - Level of Assistance: 4: Min assist;5: Stand by assistance (CGA/min A) Dynamic Standing Balance Dynamic Standing - Balance Support: During functional activity;Bilateral upper  extremity supported Dynamic Standing - Level of Assistance: 5: Stand by assistance;4: Min assist (CGA/min A) Extremity Assessment  RLE Assessment RLE Assessment: Exceptions to [facility] General Strength Comments: not formally assessed-grossly 3+/5 LLE Assessment LLE Assessment: Exceptions to [facility] General Strength Comments: not formally assessed, grossly 3+/5  Care Tool Care Tool Bed Mobility Roll left and right activity   Roll left and right assist level: Supervision/Verbal cueing    Sit to lying activity   Sit to lying assist level: Supervision/Verbal cueing    Lying to sitting on side of bed activity   Lying to sitting on side of bed assist level: the ability to move from lying on the back to sitting on the side of the bed with no back support.: Supervision/Verbal cueing     Care Tool Transfers Sit to stand transfer   Sit to stand assist level: Minimal Assistance - Patient > 75%    Chair/bed transfer   Chair/bed transfer assist level: Contact Guard/Touching assist    Car transfer   Car transfer assist level: Contact Guard/Touching assist      Care Tool Locomotion Ambulation   Assist level: Contact Guard/Touching assist Assistive device: Walker-rolling Max distance: 20  Walk 10 feet activity   Assist level: Contact Guard/Touching assist Assistive device: Walker-rolling   Walk 50 feet with 2 turns activity Walk 50 feet with 2 turns  activity did not occur: Safety/medical concerns (fatigue)      Walk 150 feet activity Walk 150 feet activity did not occur: Safety/medical concerns (fatigue)      Walk 10 feet on uneven surfaces activity Walk 10 feet on uneven surfaces activity did not occur: Safety/medical concerns      Stairs Stair activity did not occur: Safety/medical concerns        Walk up/down 1 step activity Walk up/down 1 step or curb (drop down) activity did not occur: Safety/medical concerns      Walk up/down 4 steps activity Walk up/down 4 steps activity did not occur: Safety/medical concerns      Walk up/down 12 steps activity Walk up/down 12 steps activity did not occur: Safety/medical concerns      Pick up small objects from floor   Pick up small object from the floor assist level: Total Assistance - Patient < 25%    Wheelchair Is the patient using a wheelchair?: Yes Type of Wheelchair: Manual   Wheelchair assist level: Supervision/Verbal cueing Max wheelchair distance: 150+  Wheel 50 feet with 2 turns activity   Assist Level: Supervision/Verbal cueing  Wheel 150 feet activity   Assist Level: Supervision/Verbal cueing    Refer to Care Plan for Long Term Goals  SHORT TERM GOAL WEEK 1 PT Short Term Goal 1 (Week 1): STG=LTG 2/2 ELOS  Recommendations for other services: Neuropsych  Skilled Therapeutic Intervention Mobility Bed Mobility Bed Mobility: Rolling Right;Rolling Left;Supine to Sit;Sit to Supine Rolling Right: Supervision/verbal cueing Rolling Left: Supervision/Verbal cueing Supine to Sit: Supervision/Verbal cueing Sit to Supine: Supervision/Verbal cueing Transfers Transfers: Sit to Stand;Stand to Sit;Stand Pivot Transfers Sit to Stand: Contact Guard/Touching assist;Minimal Assistance - Patient > 75% Stand to Sit: Contact Guard/Touching assist Stand Pivot Transfers: Contact Guard/Touching assist Transfer (Assistive device): Rolling walker Locomotion  Gait Ambulation:  Yes Gait Assistance: Contact Guard/Touching assist Gait Distance (Feet): 20 Feet Assistive device: Rolling walker Gait Gait: Yes Gait Pattern: Impaired (hop to) Gait velocity: decr Stairs / Additional Locomotion Stairs: No Wheelchair Mobility Wheelchair Mobility: Yes Wheelchair Assistance: Doctor, general practice: Both upper extremities Wheelchair  Parts Management: Supervision/cueing Distance: 150 +   Discharge Criteria: Patient will be discharged from PT if patient refuses treatment 3 consecutive times without medical reason, if treatment goals not met, if there is a change in medical status, if patient makes no progress towards goals or if patient is discharged from hospital.  The above assessment, treatment plan, treatment alternatives and goals were discussed and mutually agreed upon: by patient  Today's Interventions   Treatment Session 1  Pt supine in bed upon arrival. Pt agreeable to therapy. Pt denies any pain, but reports intermittnet muscle cramp at distal residual limb. Therapist provided rest breaks and repositioning.    Evaluation completed (see details above and below) with education on PT POC and goals and individual treatment initiated with focus on transfer training and amputee education.   Provided pt with 16x16 WC with L amputee pad.    PT provided pt with the following educational handouts:              Positioning in bed/WC for contracture prevention             Amputee support group              General Rehabilitation timeline             Shrinker Care guidelines   Pt performed bed mobility on flat bed with no rails and supervision.    Sit to stand with RW and min A progressing to CGA, verbal cues provided for technique and UE positioning.    Stand pivot transfer with RW and CGA, verbal cues provided for technique and sequencing   Gait x20 feet with RW and CGA with hop to gait, verbal cues provided for technique and sequencing.  Pt demos decreased clearance with fatigue, and required seated rest break.   Car transfer with RW and CGA, verbal cues provided for technique and UE positioning.    Initiated education for limb protector, positioning, and minimizing risk of pressure injury/further amputation.   Education and demonstration provided for locking/unlocking brakes, donning/doffing leg rests. Pt initiatilly required assistance but progressed to supervision with repetition, verbal cues provided for proper orientation and alignment.   Pt self propelled WC room to rehab apartment with B UE and supervision.   Doffed dressing with nurse present, opted to continue ABD pad and ACE wrap versus shrinker 2/2 drainage.   Pt supine in bed upon arrival with all needs within reach and bed alarm on.   Treatment Session 2   Pt supine in bed upon arrival. Pt agreeable to therapy. Pt denies any pain.   Pt performed supine to sit with supervision. Pt preformed stand pivot tranfer with RW and CGA bed to WC. Pt required assist with leg rests 2/2 visual deficits.   Pt propelled WC room to main gym with B UE and supervision/mod I, verbal cues provided for improved efficiency with navigating turns  Pt perofrmed the following therex for B LE strengthening/contracture prevention/pressure relief:   1x10 prone hip extension B   1x10 prone press ups B  2x10 single leg glute bridges - 2nd set holding for 5 at peak   Pt seated in WC at end of session with all needs within and seatbelt alarm on.    [facility] [place], [place], DPT  11/27/2023, 9:44 AM

## 2023-11-27 NOTE — Progress Notes (Signed)
 PROGRESS NOTE   Subjective/Complaints: Pt had a good night. Says pain is controlled. No ne complaints this morning. Denies fever or chills.   ROS: Patient denies fever, rash, sore throat, blurred vision, dizziness, nausea, vomiting, diarrhea, cough, shortness of breath or chest pain,  headache, or mood change.    Objective:   No results found. Recent Labs    11/27/23 0513  WBC 7.0  HGB 10.8*  HCT 30.9*  PLT 255   Recent Labs    11/26/23 1826  NA 136  K 3.8  CL 98  CO2 27  GLUCOSE 147*  BUN 12  CREATININE 0.70  CALCIUM  9.1    Intake/Output Summary (Last 24 hours) at 11/27/2023 0943 Last data filed at 11/27/2023 0723 Gross per 24 hour  Intake 1260 ml  Output 900 ml  Net 360 ml        Physical Exam: Vital Signs Blood pressure 135/75, pulse 82, temperature 98.1 F (36.7 C), temperature source Oral, resp. rate 20, height 5\' 9"  (1.753 m), weight 91.3 kg, SpO2 96%.  General: Alert and oriented x 3, No apparent distress HEENT: Head is normocephalic, atraumatic, PERRLA, EOMI, sclera anicteric, oral mucosa pink and moist, dentition intact, ext ear canals clear,  Neck: Supple without JVD or lymphadenopathy Heart: Reg rate and rhythm. No murmurs rubs or gallops Chest: CTA bilaterally without wheezes, rales, or rhonchi; no distress Abdomen: Soft, non-tender, non-distended, bowel sounds positive. Extremities: No clubbing, cyanosis, or edema. Pulses are 2+ Psych: Pt's affect is appropriate. Pt is cooperative Skin: left BK incision intact with central serosang dc. Some erythema and warmth around wound and proximally across knee.  Neuro:  Alert and oriented x 3. Normal insight and awareness. Intact Memory. Normal language and speech. Cranial nerve exam unremarkable. MMT: BUE 5/5 prox to distal. .RLE 4/5. LLE 3/5hf and ke. Sensory exam normal for light touch and pain in all 4 limbs. No limb ataxia or cerebellar signs. No  abnormal tone appreciated.   Musculoskeletal: left BK area still swollen and sensitive to touch.     Assessment/Plan: 1. Functional deficits which require 3+ hours per day of interdisciplinary therapy in a comprehensive inpatient rehab setting. Physiatrist is providing close team supervision and 24 hour management of active medical problems listed below. Physiatrist and rehab team continue to assess barriers to discharge/monitor patient progress toward functional and medical goals  Care Tool:  Bathing              Bathing assist       Upper Body Dressing/Undressing Upper body dressing        Upper body assist      Lower Body Dressing/Undressing Lower body dressing            Lower body assist       Toileting Toileting    Toileting assist Assist for toileting: Moderate Assistance - Patient 50 - 74%     Transfers Chair/bed transfer  Transfers assist     Chair/bed transfer assist level: Contact Guard/Touching assist     Locomotion Ambulation   Ambulation assist      Assist level: Contact Guard/Touching assist Assistive device: Walker-rolling Max distance: 20  Walk 10 feet activity   Assist     Assist level: Contact Guard/Touching assist Assistive device: Walker-rolling   Walk 50 feet activity   Assist Walk 50 feet with 2 turns activity did not occur: Safety/medical concerns (fatigue)         Walk 150 feet activity   Assist Walk 150 feet activity did not occur: Safety/medical concerns (fatigue)         Walk 10 feet on uneven surface  activity   Assist Walk 10 feet on uneven surfaces activity did not occur: Safety/medical concerns         Wheelchair     Assist Is the patient using a wheelchair?: Yes Type of Wheelchair: Manual    Wheelchair assist level: Supervision/Verbal cueing Max wheelchair distance: 150+    Wheelchair 50 feet with 2 turns activity    Assist        Assist Level:  Supervision/Verbal cueing   Wheelchair 150 feet activity     Assist      Assist Level: Supervision/Verbal cueing   Medical Problem List and Plan: 1. Functional deficits secondary to PAD and left BKA             -patient may shower if left BKA site is covered             -ELOS/Goals: 10-12 days with supervision goals for PT and OT             -Limb guard when OOB  -Patient is beginning CIR therapies today including PT and OT. Team conference today 2.  Antithrombotics: -DVT/anticoagulation:  Pharmaceutical: Heparin  5000U q8h             -antiplatelet therapy: ASA 81mg  daily 3. Pain Management: robaxin PRN. Oxy-APAP 5-325mg  1-2 tabs q4h PRN -only used prn robaxin last night 4. Mood/Behavior/Sleep/hx of PTSD and depression/anxiety: LCSW to follow for evaluation and support.              -antipsychotic agents: N/A             -Continue Bupropion  150mg  XR daily, Duloxetine  30mg  daily. Also has service dog.              -Melatonin 5mg  PRN--used for sleep last night 5. Neuropsych/cognition: This patient is capable of making decisions on his own behalf. 6. Skin/Wound Care:  dry dressing with kerlix, ACE. Change daily and as aneeded 7. Fluids/Electrolytes/Nutrition:  . Continue supplements and vitamins.   -albumin  is low.  -encourage PO, protein supps 8. HTN: home meds Lisinopril  10mg  daily             -BPs have been fine postop, monitor with increased mobility 9. Cellulitis?: Residual limb edematous and tight with erythema extendin medially to mid thigh. Keflex initiated by PA for wound prophylaxis.              -will continue for now as pt tolerating med without problems   -wbc's normal at 7 10. CAD s/p PCI: Monitor for symptoms with increase in activity  11. 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 Constipation: Senna added  13.HLD: continue Atorvastatin  80mg  daily 14. BPH: continue Flomax  0.4mg  daily 15. ABLA: Monitor for signs of bleeding. Recheck CBC in am.  16.  Hyponatremia: 136 6/2 (lab drawn in evening).  17. GERD/GI ppx: Managed with Protonix  40mg  daily    LOS: 1 days A FACE TO FACE EVALUATION WAS PERFORMED  Rawland Caddy 11/27/2023, 9:43 AM    pr

## 2023-11-27 NOTE — Progress Notes (Signed)
 Inpatient Rehabilitation Care Coordinator Assessment and Plan Patient Details  Name: [name] [name] MRN: [id number] Date of Birth: 15-Aug-1945  Today's Date: 11/27/2023  Hospital Problems: Principal Problem:   S/P BKA (below knee amputation), left [facility]) Active Problems:   Essential hypertension  Past Medical History:  Past Medical History:  Diagnosis Date   Anxiety    Cancer (HCC)    skin cancer   Carpal tunnel syndrome    Coronary artery disease    DES x4   Depression    Hepatitis    Hepatitis A during [place] - received treatment   Hypercholesteremia    Hypertension    Mononeuropathy    Left Leg   Myocardial infarction (HCC) 2010ish   Peripheral vascular disease (HCC)    Pneumonia    PTSD (post-traumatic stress disorder)    PTSD (post-traumatic stress disorder)    Sleep apnea    doesnt use CPAP. Sinus surgery fixed OSA issues   Stroke (HCC)    slight weakness left- 85- 90 % returned   Past Surgical History:  Past Surgical History:  Procedure Laterality Date   AMPUTATION Left 11/21/2023   Procedure: AMPUTATION BELOW KNEE;  Surgeon: Margherita Shell, MD;  Location: [facility] OR;  Service: Vascular;  Laterality: Left;  block   CARDIAC CATHETERIZATION  11/12/2021   DES stent placed   CARPAL TUNNEL RELEASE Right 06/10/2013   Procedure: RIGHT CARPAL TUNNEL RELEASE;  Surgeon: Arnie Lao, MD;  Location: [facility] OR;  Service: Orthopedics;  Laterality: Right;   CARPAL TUNNEL RELEASE Left    CATARACT EXTRACTION Bilateral    COLONOSCOPY     CORONARY ANGIOPLASTY     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 His smoking use included cigarettes. He started smoking about 56 years ago. He has a 40 pack-year smoking history. He has never used smokeless tobacco. He reports that he does not drink alcohol  and does not use drugs.  Family / Support Systems Marital Status: Married Patient Roles: Spouse, Parent, Other (Comment) (retiree) Spouse/Significant Other: darlene [phone] Children: Donna-daughter [phone] Other Supports: [place] and neighbors Anticipated Caregiver: Wife Ability/Limitations of Caregiver: In good health and able to provide assist Caregiver Availability: 24/7 Family Dynamics: Close knit with family and friends, he has s ervice dog for emotional support named Bandit. He is doing well and will be short LOS  Social History Preferred language: English Religion: Baptist Cultural Background: NA Education: HS Health Literacy - How often do you need to have someone help you when you read instructions, pamphlets, or other written material from your doctor or pharmacy?: Never Writes: Yes Employment Status: Retired Marine scientist Issues: NA Guardian/Conservator: None-according to MD pt is capable of making his own decisions   Abuse/Neglect Abuse/Neglect Assessment Can Be Completed: Yes Physical Abuse: Denies Verbal Abuse: Denies Sexual Abuse: Denies Exploitation of patient/patient's resources: Denies Self-Neglect: Denies  Patient response  to: Social Isolation - How often do you feel lonely or isolated from those around you?: Never  Emotional Status Pt's affect, behavior and adjustment status: Pt is doing well and wants to go home ASAP, he is more comfortable at home and his wife can assist him. Having bathroom done and ramp installed Recent Psychosocial Issues: other  health issues Psychiatric History: Hx-PTSD has service dog and see's counselor at [place]. If here long enough would benefit from neuro-psych Substance Abuse History: NA  Patient / Family Perceptions, Expectations & Goals Pt/Family understanding of illness & functional limitations: Pt is able to explain his BKA and is taking each day at a time. He does talk with the MD inovlved and feels is doing well already Premorbid pt/family roles/activities: husband, father, vet, friend Anticipated changes in roles/activities/participation: resume Pt/family expectations/goals: Pt states: " I hope to go home soon I feel like I'm doing well."  [employer]Comment) [place] Premorbid Home Care/DME Agencies: Other (Comment) (had HH after back surgery and has all needed equipment) Transportation available at discharge: wife Is the patient able to respond to transportation needs?: Yes In the past 12 months, has lack of transportation kept you from medical appointments or from getting medications?: No In the past 12 months, has lack of transportation kept you from meetings, work, or from getting things needed for daily living?: No Resource referrals recommended: Neuropsychology  Discharge Planning Living Arrangements: Spouse/significant other Support Systems: Spouse/significant other, Children, Friends/neighbors Type of Residence: Private residence Insurance Resources: Media pl[employer] (specify) ([place]) Financial Resources: Social Security, Family Support Financial Screen Referred: No Living Expenses: Own Money Management: Patient, Spouse Does the patient have any problems obtaining your medications?: No Home Management: both Patient/Family Preliminary Plans: Return home with wife who is able to assist if needed. Currently having a ramp built and bathroom made handicapped accessible Care Coordinator Anticipated Follow Up Needs: HH/OP  Clinical Impression Pleasant gentleman who  is doing well with this BKA and wants to go home soon. Will await therapy evaluations  Mardell Shade 11/27/2023, 10:06 AM

## 2023-11-27 NOTE — Plan of Care (Signed)
  Problem: RH Balance Goal: LTG: Patient will maintain dynamic sitting balance (OT) Description: LTG:  Patient will maintain dynamic sitting balance with assistance during activities of daily living (OT) Flowsheets (Taken 11/27/2023 1208) LTG: Pt will maintain dynamic sitting balance during ADLs with: Independent Goal: LTG Patient will maintain dynamic standing with ADLs (OT) Description: LTG:  Patient will maintain dynamic standing balance with assist during activities of daily living (OT)  Flowsheets (Taken 11/27/2023 1208) LTG: Pt will maintain dynamic standing balance during ADLs with: Independent with assistive device   Problem: Sit to Stand Goal: LTG:  Patient will perform sit to stand in prep for activites of daily living with assistance level (OT) Description: LTG:  Patient will perform sit to stand in prep for activites of daily living with assistance level (OT) Flowsheets (Taken 11/27/2023 1208) LTG: PT will perform sit to stand in prep for activites of daily living with assistance level: Independent with assistive device   Problem: RH Grooming Goal: LTG Patient will perform grooming w/assist,cues/equip (OT) Description: LTG: Patient will perform grooming with assist, with/without cues using equipment (OT) Flowsheets (Taken 11/27/2023 1208) LTG: Pt will perform grooming with assistance level of: Independent with assistive device    Problem: RH Bathing Goal: LTG Patient will bathe all body parts with assist levels (OT) Description: LTG: Patient will bathe all body parts with assist levels (OT) Flowsheets (Taken 11/27/2023 1208) LTG: Pt will perform bathing with assistance level/cueing: Set up assist    Problem: RH Dressing Goal: LTG Patient will perform upper body dressing (OT) Description: LTG Patient will perform upper body dressing with assist, with/without cues (OT). Flowsheets (Taken 11/27/2023 1208) LTG: Pt will perform upper body dressing with assistance level of: Independent with  assistive device Goal: LTG Patient will perform lower body dressing w/assist (OT) Description: LTG: Patient will perform lower body dressing with assist, with/without cues in positioning using equipment (OT) Flowsheets (Taken 11/27/2023 1208) LTG: Pt will perform lower body dressing with assistance level of: Independent with assistive device   Problem: RH Toileting Goal: LTG Patient will perform toileting task (3/3 steps) with assistance level (OT) Description: LTG: Patient will perform toileting task (3/3 steps) with assistance level (OT)  Flowsheets (Taken 11/27/2023 1208) LTG: Pt will perform toileting task (3/3 steps) with assistance level: Independent with assistive device   Problem: RH Simple Meal Prep Goal: LTG Patient will perform simple meal prep w/assist (OT) Description: LTG: Patient will perform simple meal prep with assistance, with/without cues (OT). Flowsheets (Taken 11/27/2023 1208) LTG: Pt will perform simple meal prep with assistance level of: Independent with assistive device   Problem: RH Toilet Transfers Goal: LTG Patient will perform toilet transfers w/assist (OT) Description: LTG: Patient will perform toilet transfers with assist, with/without cues using equipment (OT) Flowsheets (Taken 11/27/2023 1208) LTG: Pt will perform toilet transfers with assistance level of: Independent with assistive device   Problem: RH Tub/Shower Transfers Goal: LTG Patient will perform tub/shower transfers w/assist (OT) Description: LTG: Patient will perform tub/shower transfers with assist, with/without cues using equipment (OT) Flowsheets (Taken 11/27/2023 1208) LTG: Pt will perform tub/shower stall transfers with assistance level of: Independent with assistive device

## 2023-11-27 NOTE — Progress Notes (Addendum)
 PMR Admission Coordinator Pre-Admission Assessment   Patient: [name] [name] is an 78 y.o., male MRN: [id number] DOB: 1946-03-03 Height: 5\' 9"  (175.3 cm) Weight: 90.9 kg   Insurance Information HMO:     PPO:      PCP:      IPA:      80/20:      OTHER:  PRIMARY: [facility]      Policy#: [id number]      Subscriber: patient CM Name: Cassie      Phone#: [phone], email: [email or web address]     Fax#: [phone] Pre-Cert#: [id number]      Pt. is approved for 30-days inpatient treatment 11/26/2023 through 12/26/2023. Please submit progress notes every 2 weeks. Employer:  Benefits:  Phone #: [phone]     Name:  Eff. Date: 12/04/22     Deduct:       Out of Pocket Max:       Life Max:  CIR[facility] per [facility]      SNF:  Outpatient:      Co-Pay:  Home Health:       Co-Pay:  DME:      Co-Pay:  Providers: in-network SECONDARY: Medicare Part A      Policy#: [id number]     Phone#:    Artist:       Phone#:    The "Data Collection Information Summary" for patients in Inpatient Rehabilitation Facilities with attached "Privacy Act Statement-Health Care Records" was provided and verbally reviewed with: Patient   Emergency Contact Information Contact Information       Name Relation Home Work Mobile    [name],[name] Spouse [phone]   [phone]         Other Contacts       Name Relation Home Work [name] [name] Daughter [phone]   [phone]           Current Medical History  Patient Admitting Diagnosis: Left BKA   History of Present Illness: [name] [name] is a 78 y.o. male with past medical history of MI, CAD, anxiety, depression, HLD, PTSD, CVA. He was admitted vascular service at [facility] on 11/21/23 due to non healing wounds and intractable pain. 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 Pt. Tolerated well and was seen by PT/OT post operatively. They recommended CIR to assist return to PLOF    Patient's medical record from  [facility] has been reviewed by the rehabilitation admission coordinator and physician.   Past Medical History      Past Medical History:  Diagnosis Date   Anxiety     Cancer (HCC)      skin cancer   Carpal tunnel syndrome     Coronary artery disease      DES x4   Depression     Hepatitis      Hepatitis A during [place] - received treatment   Hypercholesteremia     Hypertension     Mononeuropathy      Left Leg   Myocardial infarction (HCC) 2010ish   Peripheral vascular disease (HCC)     Pneumonia     PTSD (post-traumatic stress disorder)     PTSD (post-traumatic stress disorder)     Sleep apnea      doesnt use CPAP. Sinus surgery fixed OSA issues   Stroke (HCC)      slight weakness left- 85- 90 % returned          Has the patient had major surgery  during 100 days prior to admission? yes   Family History   family history includes Heart disease in his mother; Lung cancer in his father.   Current Medications  Current Medications    Current Facility-Administered Medications:    alum & mag hydroxide-simeth (MAALOX/MYLANTA) 200-200-20 MG/5ML suspension 15-30 mL, 15-30 mL, Oral, Q2H PRN, Rhyne, Samantha J, PA-C   aspirin  EC tablet 81 mg, 81 mg, Oral, Daily, Rhyne, Samantha J, PA-C, 81 mg at 11/24/23 1610   atorvastatin  (LIPITOR) tablet 80 mg, 80 mg, Oral, Daily, Rhyne, Samantha J, PA-C, 80 mg at 11/24/23 9604   bisacodyl  (DULCOLAX) suppository 10 mg, 10 mg, Rectal, Daily PRN, Rhyne, Samantha J, PA-C   buPROPion  (WELLBUTRIN  XL) 24 hr tablet 150 mg, 150 mg, Oral, Daily, Rhyne, Samantha J, PA-C, 150 mg at 11/24/23 5409   cyanocobalamin  (VITAMIN B12) tablet 500 mcg, 500 mcg, Oral, Daily, Rhyne, Samantha J, PA-C, 500 mcg at 11/24/23 8119   diphenhydrAMINE  (BENADRYL ) 25 mg in sodium chloride  0.9 % 50 mL IVPB, 25 mg, Intravenous, Q4H PRN, Hazeline Lister, Emma M, PA-C, Last Rate: 100 mL/hr at 11/22/23 1536, 25 mg at 11/22/23 1536   docusate sodium  (COLACE) capsule 100 mg, 100  mg, Oral, Daily, Rhyne, Samantha J, PA-C, 100 mg at 11/24/23 1478   DULoxetine  (CYMBALTA ) DR capsule 30 mg, 30 mg, Oral, Daily, Rhyne, Samantha J, PA-C, 30 mg at 11/24/23 2956   guaiFENesin -dextromethorphan  (ROBITUSSIN DM) 100-10 MG/5ML syrup 15 mL, 15 mL, Oral, Q4H PRN, Rhyne, Samantha J, PA-C   heparin  injection 5,000 Units, 5,000 Units, Subcutaneous, Q8H, Rhyne, Samantha J, PA-C, 5,000 Units at 11/24/23 0509   hydrALAZINE  (APRESOLINE ) injection 5 mg, 5 mg, Intravenous, Q20 Min PRN, Rhyne, Samantha J, PA-C   labetalol  (NORMODYNE ) injection 10 mg, 10 mg, Intravenous, Q10 min PRN, Rhyne, Samantha J, PA-C   magnesium  sulfate IVPB 2 g 50 mL, 2 g, Intravenous, Daily PRN, Rhyne, Samantha J, PA-C   metoprolol  tartrate (LOPRESSOR ) injection 2-5 mg, 2-5 mg, Intravenous, Q2H PRN, Rhyne, Samantha J, PA-C   morphine  (PF) 2 MG/ML injection 2 mg, 2 mg, Intravenous, Q2H PRN, Rhyne, Samantha J, PA-C, 2 mg at 11/24/23 2130   ondansetron  (ZOFRAN ) injection 4 mg, 4 mg, Intravenous, Q6H PRN, Rhyne, Samantha J, PA-C   oxyCODONE -acetaminophen  (PERCOCET/ROXICET) 5-325 MG per tablet 1-2 tablet, 1-2 tablet, Oral, Q4H PRN, Rhyne, Samantha J, PA-C, 2 tablet at 11/23/23 1640   pantoprazole  (PROTONIX ) EC tablet 40 mg, 40 mg, Oral, Daily, Rhyne, Samantha J, PA-C, 40 mg at 11/24/23 8657   phenol (CHLORASEPTIC) mouth spray 1 spray, 1 spray, Mouth/Throat, PRN, Rhyne, Samantha J, PA-C   polyethylene glycol (MIRALAX  / GLYCOLAX ) packet 17 g, 17 g, Oral, Daily PRN, Rhyne, Samantha J, PA-C   polyvinyl alcohol  (LIQUIFILM TEARS) 1.4 % ophthalmic solution 1 drop, 1 drop, Both Eyes, TID PRN, Rhyne, Samantha J, PA-C   potassium chloride  SA (KLOR-CON  M) CR tablet 20-40 mEq, 20-40 mEq, Oral, Daily PRN, Rhyne, Samantha J, PA-C   tamsulosin  (FLOMAX ) capsule 0.4 mg, 0.4 mg, Oral, Daily, Rhyne, Samantha J, PA-C, 0.4 mg at 11/24/23 8469     Patients Current Diet:  Diet Order                  Diet Heart Room service appropriate? Yes; Fluid  consistency: Thin  Diet effective now                         Precautions / Restrictions Precautions Precautions: Fall Other Brace:  L limb protector Restrictions Weight Bearing Restrictions Per Provider Order: Yes LLE Weight Bearing Per Provider Order: Non weight bearing (bka)    Has the patient had 2 or more falls or a fall with injury in the past year? Yes   Prior Activity Level Community (5-7x/wk): Pt. active in the community PTA   Prior Functional Level Self Care: Did the patient need help bathing, dressing, using the toilet or eating? Independent   Indoor Mobility: Did the patient need assistance with walking from room to room (with or without device)? Independent   Stairs: Did the patient need assistance with internal or external stairs (with or without device)? Independent   Functional Cognition: Did the patient need help planning regular tasks such as shopping or remembering to take medications? Independent   Patient Information Are you of Hispanic, Latino/a,or Spanish origin?: A. No, not of Hispanic, Latino/a, or Spanish origin What is your race?: A. White Do you need or want an interpreter to communicate with a doctor or health care staff?: 0. No   Patient's Response To:  Health Literacy and Transportation Is the patient able to respond to health literacy and transportation needs?: Yes Health Literacy - How often do you need to have someone help you when you read instructions, pamphlets, or other written material from your doctor or pharmacy?: Never In the past 12 months, has lack of transportation kept you from medical appointments or from getting medications?: No In the past 12 months, has lack of transportation kept you from meetings, work, or from getting things needed for daily living?: No   Journalist, newspaper / Equipment Home Equipment: Occupational hygienist (4 wheels), [employer] - single point, Information systems manager, Art gallery manager, Wheelchair - manual, Other (comment) ([facility] is  [facility] transport chair)   Prior Device Use: Indicate devices/aids used by the patient prior to current illness, exacerbation or injury? None of the above   Current Functional Level Cognition   Orientation Level: Oriented X4    Extremity Assessment (includes Sensation/Coordination)   Upper Extremity Assessment: Defer to OT evaluation  Lower Extremity Assessment: LLE deficits/detail LLE Deficits / Details: Limited knee flexion ROM. Able to SLR and activate quad LLE Sensation: WNL     ADLs   Overall ADL's : Needs assistance/impaired Eating/Feeding: Independent, Sitting Grooming: Set up, Sitting Upper Body Bathing: Set up, Sitting Lower Body Bathing: Minimal assistance, Sitting/lateral leans Upper Body Dressing : Set up, Sitting Lower Body Dressing: Minimal assistance, Bed level Lower Body Dressing Details (indicate cue type and reason): shorts Toilet Transfer: Minimal assistance, Stand-pivot, BSC/3in1 Toileting- Clothing Manipulation and Hygiene: Set up, Sitting/lateral lean     Mobility   Overal bed mobility: Modified Independent General bed mobility comments: + use of bed rails and HOB elevated     Transfers   Overall transfer level: Needs assistance Equipment used: Rolling walker (2 wheels) Transfers: Sit to/from Stand Sit to Stand: Contact guard assist Bed to/from chair/wheelchair/BSC transfer type:: Step pivot Step pivot transfers: Min assist General transfer comment: CGA for safety, good recall for hand placement     Ambulation / Gait / Stairs / Wheelchair Mobility   Ambulation/Gait Ambulation/Gait assistance: Editor, commissioning (Feet): 24 Feet Assistive device: Rolling walker (2 wheels) Gait Pattern/deviations:  (hop to) General Gait Details: min A to maintain balanace especailly wih last ~5' as pt with flexed RLE in stance due to fatigue, cues to keep RW in contact with floor Gait velocity: decr     Posture / Balance Balance Overall  balance  assessment: Needs assistance Sitting-balance support: No upper extremity supported Sitting balance-Leahy Scale: Good Standing balance support: Bilateral upper extremity supported, During functional activity, Reliant on assistive device for balance Standing balance-Leahy Scale: Poor Standing balance comment: reliant on UE support     Special needs/care consideration Skin leg/left    Previous Home Environment (from acute therapy documentation) Living Arrangements: Spouse/significant other Available Help at Discharge: Family, Available 24 hours/day, Other (Comment) (wife cannot provide physical assist) Type of Home: House Home Layout: One level Home Access: Stairs to enter, Other (comment) ([facility] is measuring for a ramp) Entrance Stairs-Number of Steps: 2 Bathroom Shower/Tub: Psychologist, counselling, Engineer, manufacturing systems: Standard Bathroom Accessibility: Yes How Accessible: Accessible via walker, Accessible via wheelchair Home Care Services: No   Discharge Living Setting Plans for Discharge Living Setting: Patient's home, House Type of Home at Discharge: House Discharge Home Layout: One level Discharge Home Access: Stairs to enter Entrance Stairs-Rails: Right, Left Entrance Stairs-Number of Steps: 2 Discharge Bathroom Shower/Tub: Walk-in shower Discharge Bathroom Toilet: Standard Discharge Bathroom Accessibility: Yes How Accessible: Accessible via walker, Accessible via wheelchair Does the patient have any problems obtaining your medications?: No   Social/Family/Support Systems Patient Roles: Spouse Contact Information: [phone] Anticipated Caregiver: [name] Anticipated Caregiver's Contact Information: Min A Ability/Limitations of Caregiver: 24/7 Caregiver Availability: 24/7 Discharge Plan Discussed with Primary Caregiver: Yes Is Caregiver In Agreement with Plan?: Yes Does Caregiver/Family have Issues with Lodging/Transportation while Pt is in Rehab?: Yes    Goals Patient/Family Goal for Rehab: PT/OT Supervision Expected length of stay: 10-12 days Pt/Family Agrees to Admission and willing to participate: Yes Program Orientation Provided & Reviewed with Pt/Caregiver Including Roles  & Responsibilities: Yes   Decrease burden of Care through IP rehab admission: NA   Possible need for SNF placement upon discharge: Not anticipated   Patient Condition: I have reviewed medical records from [facility], spoken with CM, and patient. I met with patient at the bedside for inpatient rehabilitation assessment.  Patient will benefit from ongoing PT and OT, can actively participate in 3 hours of therapy a day 5 days of the week, and can make measurable gains during the admission.  Patient will also benefit from the coordinated team approach during an Inpatient Acute Rehabilitation admission.  The patient will receive intensive therapy as well as Rehabilitation physician, nursing, social worker, and care management interventions.  Due to safety, skin/wound care, disease management, medication administration, pain management, and patient education the patient requires 24 hour a day rehabilitation nursing.  The patient is currently CGA-Min A with mobility and Set up-Min A with basic ADLs.  Discharge setting and therapy post discharge at home with home health is anticipated.  Patient has agreed to participate in the Acute Inpatient Rehabilitation Program and will admit today.   Preadmission Screen Completed By:  Dorena Gander, with updates by Amiel Kalata, 11/24/2023 9:48 AM ______________________________________________________________________   Discussed status with Dr. Rachel Budds   on 11/26/23  at 10:01 AM and received approval for admission today.   Admission Coordinator:  Dorena Gander, CCC-SLP, time 10:01 AM/Date 11/26/23     Assessment/Plan: Diagnosis: left BKA Does the need for close, 24 hr/day Medical supervision in concert with the patient's  rehab needs make it unreasonable for this patient to be served in a less intensive setting? Yes Co-Morbidities requiring supervision/potential complications: MI. CAD, depression, ptsd, prior cva Due to bladder management, bowel management, safety, skin/wound care, disease management, medication administration, pain management, and patient  education, does the patient require 24 hr/day rehab nursing? Yes Does the patient require coordinated care of a physician, rehab nurse, PT, OT to address physical and functional deficits in the context of the above medical diagnosis(es)? Yes Addressing deficits in the following areas: balance, endurance, locomotion, strength, transferring, bowel/bladder control, bathing, dressing, feeding, grooming, toileting, and psychosocial support Can the patient actively participate in an intensive therapy program of at least 3 hrs of therapy 5 days a week? Yes The potential for patient to make measurable gains while on inpatient rehab is excellent Anticipated functional outcomes upon discharge from inpatient rehab: supervision PT, supervision OT, n/a SLP Estimated rehab length of stay to reach the above functional goals is: 10-12 days Anticipated discharge destination: Home 10. Overall Rehab/Functional Prognosis: excellent     MD Signature: Rawland Caddy, MD, [facility] Physical Medicine & Rehabilitation Medical Director Rehabilitation Services 11/26/2023

## 2023-11-27 NOTE — Evaluation (Signed)
 Occupational Therapy Assessment and Plan  Patient Details  Name: [name] [name] MRN: [id number] Date of Birth: Oct 20, 1945  OT Diagnosis: abnormal posture, muscular wasting and disuse atrophy, and muscle weakness (generalized) Rehab Potential:   ELOS: 7-10 days   Today's Date: 11/27/2023 OT Individual Time: 4098-1191 OT Individual Time Calculation (min): 73 min     Hospital Problem: Principal Problem:   S/P BKA (below knee amputation), left (HCC) Active Problems:   Essential hypertension   Past Medical History:  Past Medical History:  Diagnosis Date   Anxiety    Cancer (HCC)    skin cancer   Carpal tunnel syndrome    Coronary artery disease    DES x4   Depression    Hepatitis    Hepatitis A during [place] - received treatment   Hypercholesteremia    Hypertension    Mononeuropathy    Left Leg   Myocardial infarction (HCC) 2010ish   Peripheral vascular disease (HCC)    Pneumonia    PTSD (post-traumatic stress disorder)    PTSD (post-traumatic stress disorder)    Sleep apnea    doesnt use CPAP. Sinus surgery fixed OSA issues   Stroke (HCC)    slight weakness left- 85- 90 % returned   Past Surgical History:  Past Surgical History:  Procedure Laterality Date   AMPUTATION Left 11/21/2023   Procedure: AMPUTATION BELOW KNEE;  Surgeon: Margherita Shell, MD;  Location: [facility] OR;  Service: Vascular;  Laterality: Left;  block   CARDIAC CATHETERIZATION  11/12/2021   DES stent placed   CARPAL TUNNEL RELEASE Right 06/10/2013   Procedure: RIGHT CARPAL TUNNEL RELEASE;  Surgeon: Arnie Lao, MD;  Location: [facility] OR;  Service: Orthopedics;  Laterality: Right;   CARPAL TUNNEL RELEASE Left    CATARACT EXTRACTION Bilateral    COLONOSCOPY     CORONARY ANGIOPLASTY     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 male with a PMHx of HTN, HLD, depression/anxiety, CAD s/p PCI x4 ([facility] [place]), PTSD, CVA w/residual L weakness, agent orange exposure, OSA s/p nasal surgeries, HOH, lumbar decompression in the past, mononeuropathy LLE with severe pain and blistering having failed multiple medical interventions and underwent L- BKA by Dr. Charlotte Cookey on 11/21/23. Post op ABLA noted with Hgb drop from 13.7 to 10.7. Therapy has been working with patient who requires CGA to min assist with ADLs and mobility. He was independent PTA admission and CIR recommended due to functional decline.   Patient currently requires min with basic self-care skills secondary to muscle weakness, decreased cardiorespiratoy endurance, decreased coordination, decreased motor planning, and decreased sitting balance, decreased standing balance, decreased postural control, and decreased balance strategies.  Prior to hospitalization, patient could complete BADL and IADL with independent .  Patient will benefit from skilled intervention to decrease level of assist with basic self-care skills, increase independence with basic  self-care skills, and increase level of independence with iADL prior to discharge home with care partner.  Anticipate patient will require intermittent supervision and follow up home health.  OT - End of Session Activity Tolerance: Tolerates 30+ min activity with multiple rests Endurance Deficit: Yes OT Assessment OT Barriers to Discharge: Wound Care OT Patient demonstrates impairments in the following area(s):  Balance;Edema;Endurance;Motor;Perception;Safety;Sensory;Skin Integrity OT Basic ADL's Functional Problem(s): Grooming;Bathing;Dressing;Toileting OT Advanced ADL's Functional Problem(s): Simple Meal Preparation OT Transfers Functional Problem(s): Toilet;Tub/Shower OT Additional Impairment(s): None OT Plan OT Intensity: Minimum of 1-2 x/day, 45 to 90 minutes OT Frequency: 5 out of 7 days OT Duration/Estimated Length of Stay: 7-10 days OT Treatment/Interventions: Balance/vestibular training;Self Care/advanced ADL retraining;UE/LE Coordination activities;Functional mobility training;Skin care/wound managment;Community reintegration;Neuromuscular re-education;Splinting/orthotics;Wheelchair propulsion/positioning;Discharge planning;Pain management;Therapeutic Activities;Disease mangement/prevention;Patient/family education;Therapeutic Exercise;DME/adaptive equipment instruction;Psychosocial support;UE/LE Strength taining/ROM OT Self Feeding Anticipated Outcome(s): no goal OT Basic Self-Care Anticipated Outcome(s): MOD I OT Toileting Anticipated Outcome(s): MOD I OT Bathroom Transfers Anticipated Outcome(s): MOD I OT Recommendation Recommendations for Other Services: Therapeutic Recreation consult Therapeutic Recreation Interventions: Other (comment) (living with limb loss and enjoying activities TEFL teacher, wood working, handy man, Catering manager)) Patient destination: Home Follow Up Recommendations: Home health OT Equipment Recommended: To be determined   OT Evaluation Precautions/Restrictions  Precautions Precautions: Fall Recall of Precautions/Restrictions: Intact Other Brace: L limb protector Restrictions Weight Bearing Restrictions Per Provider Order: Yes LLE Weight Bearing Per Provider Order: Non weight bearing Pain Pain Assessment Pain Scale: 0-10 Pain Score: 0-No pain Home Living/Prior Functioning Home Living Family/patient expects to be discharged to:: Private residence Living  Arrangements: Spouse/significant other Available Help at Discharge: Family, Available 24 hours/day, Other (Comment) Type of Home: House Home Access: Stairs to enter, Other (comment) Entrance Stairs-Number of Steps: 2 Entrance Stairs-Rails: None Home Layout: One level Bathroom Shower/Tub: Psychologist, counselling, Engineer, manufacturing systems: Handicapped height Bathroom Accessibility: Yes Additional Comments: pt states he is getting doorways widened for bathroom for wheelchair accessible too  Lives With: Spouse Prior Function Level of Independence: Independent with basic ADLs, Independent with transfers, Independent with gait, Independent with homemaking with ambulation  Able to Take Stairs?: Yes Driving: Yes Vocation: On disability Vision Baseline Vision/History: 1 Wears glasses Ability to See in Adequate Light: 0 Adequate Patient Visual Report: No change from baseline Vision Assessment?: No apparent visual deficits Perception  Perception: Impaired Perception-Other Comments: grossly impaired 2/2 BKA Praxis Praxis: WFL Cognition Cognition Overall Cognitive Status: Within Functional Limits for tasks assessed Arousal/Alertness: Awake/alert Orientation Level: Person;Place;Situation Person: Oriented Place: Oriented Situation: Oriented Memory: Appears intact Awareness: Appears intact Problem Solving: Appears intact Safety/Judgment: Appears intact Brief Interview for Mental Status (BIMS) Repetition of Three Words (First Attempt): 3 Temporal Orientation: Year: Correct Temporal Orientation: Month: Accurate within 5 days Temporal Orientation: Day: Incorrect Recall: "Sock": Yes, no cue required Recall: "Blue": Yes, no cue required Recall: "Bed": Yes, no cue required BIMS Summary Score: 14 Sensation Sensation Light Touch: Impaired by gross assessment Additional Comments: Pt reports N&T on distal resiudal limb, and intermittent phantom sensations as though his toe is  itching Coordination Gross Motor Movements are Fluid and Coordinated: No Fine Motor Movements are Fluid and Coordinated: Yes Motor  Motor Motor: Other (comment) (motor skills grossly limited by BKA) Motor - Skilled Clinical Observations: L BKA  Trunk/Postural Assessment  Cervical Assessment Cervical Assessment: Exceptions to [facility] Thoracic Assessment Thoracic Assessment: Exceptions to [facility] Lumbar Assessment Lumbar Assessment: Exceptions to [facility] Postural Control Postural Control: Deficits on evaluation Righting Reactions: delayed on eval Protective Responses: delayed on eval  Balance Balance Balance Assessed: Yes Static Sitting Balance Static Sitting - Balance Support: Feet supported Static Sitting - Level of Assistance: 5: Stand by assistance Dynamic Sitting Balance Dynamic Sitting - Balance Support: During functional activity Dynamic Sitting - Level of Assistance: 5: Stand by assistance Dynamic Sitting - Balance Activities: Lateral lean/weight shifting;Forward lean/weight shifting;Reaching for objects Static Standing Balance Static Standing - Balance Support: Bilateral upper extremity supported;During functional activity Static Standing - Level of Assistance: 5: Stand by assistance Dynamic Standing Balance Dynamic Standing - Balance Support: During functional activity;Bilateral upper extremity supported Dynamic Standing - Level of Assistance: 4: Min assist Dynamic Standing - Balance Activities: Lateral lean/weight shifting;Forward lean/weight shifting;Reaching for objects Extremity/Trunk Assessment RUE Assessment RUE Assessment: Within Functional Limits LUE Assessment LUE Assessment: Within Functional Limits  Care Tool Care Tool Self Care Eating   Eating Assist Level: Set up assist    Oral Care    Oral Care Assist Level: Set up assist    Bathing   Body parts bathed by patient: Right arm;Left arm;Chest;Abdomen;Front perineal area;Buttocks;Right upper leg;Left upper  leg;Right lower leg;Face   Body parts n/a: Left lower leg Assist Level: Contact Guard/Touching assist    Upper Body Dressing(including orthotics)   What is the patient wearing?: Pull over shirt   Assist Level: Supervision/Verbal cueing    Lower Body Dressing (excluding footwear)   What is the patient wearing?: Pants Assist for lower body dressing: Minimal Assistance - Patient > 75%    Putting on/Taking off footwear   What is the patient wearing?: Non-skid slipper socks;Orthosis Assist for footwear: Moderate Assistance - Patient 50 - 74%       Care Tool Toileting Toileting activity   Assist for toileting: Minimal Assistance - Patient > 75%     Care Tool Bed Mobility Roll left and right activity   Roll left and right assist level: Supervision/Verbal cueing    Sit to lying activity   Sit to lying assist level: Supervision/Verbal cueing    Lying to sitting on side of bed activity   Lying to sitting on side of bed assist level: the ability to move from lying on the back to sitting on the side of the bed with no back support.: Supervision/Verbal cueing     Care Tool Transfers Sit to stand transfer   Sit to stand assist level: Minimal Assistance - Patient > 75%    Chair/bed transfer   Chair/bed transfer assist level: Contact Guard/Touching assist     Toilet transfer   Assist Level: Minimal Assistance - Patient > 75%     Care Tool Cognition  Expression of Ideas and Wants Expression of Ideas and Wants: 4. Without difficulty (complex and basic) - expresses complex messages without difficulty and with speech that is clear and easy to understand  Understanding Verbal and Non-Verbal Content Understanding Verbal and Non-Verbal Content: 4. Understands (complex and basic) - clear comprehension without cues or repetitions   Memory/Recall Ability Memory/Recall Ability : Current season;Staff names and faces;Location of own room;That he or she is in a hospital/hospital unit   Refer  to Care Plan for Long Term Goals  SHORT TERM GOAL WEEK 1 OT Short Term Goal 1 (Week 1): STG = LTG at MOD I 2/2 ELOS  Recommendations for other services: Therapeutic Recreation  Pet therapy   Skilled Therapeutic Intervention ADL ADL Eating: Set up Grooming: Setup Upper Body Bathing: Supervision/safety Lower Body Bathing: Contact guard Upper Body Dressing: Supervision/safety Lower Body Dressing: Minimal assistance Toileting: Minimal assistance Toilet Transfer:  Minimal assistance Mobility  Bed Mobility Bed Mobility: Rolling Right;Rolling Left;Supine to Sit;Sit to Supine Rolling Right: Supervision/verbal cueing Rolling Left: Supervision/Verbal cueing Supine to Sit: Supervision/Verbal cueing Sit to Supine: Supervision/Verbal cueing Transfers Sit to Stand: Minimal Assistance - Patient > 75% Stand to Sit: Contact Guard/Touching assist  Skilled Intervention: Pt bed level at time of session, asleep but woken with verbal stimuli. Initial portion of session answering questions about OT, reviewing POC, goals, etc.  Focus of session on ADL retraining at shower level with pt performing stand pivot transfers with RW at [facility] for power up and CGA for transfer bed <> wheelchair <> shower bench. OT covering residual limb for shower. UB/LB bathing with CGA for standing portion. UB dress supervision, LB dress MIN/CGA. Set up at sink for shaving, oral hygiene, etc. Pt ed on DC planning and prep for home. Set up bed level alarm on call bell in reach.    Discharge Criteria: Patient will be discharged from OT if patient refuses treatment 3 consecutive times without medical reason, if treatment goals not met, if there is a change in medical status, if patient makes no progress towards goals or if patient is discharged from hospital.  The above assessment, treatment plan, treatment alternatives and goals were discussed and mutually agreed upon: by patient  Doroteo Gasmen 11/27/2023, 11:58 AM

## 2023-11-27 NOTE — Plan of Care (Signed)
  Problem: RH Balance Goal: LTG Patient will maintain dynamic sitting balance (PT) Description: LTG:  Patient will maintain dynamic sitting balance with assistance during mobility activities (PT) Flowsheets (Taken 11/27/2023 1252) LTG: Pt will maintain dynamic sitting balance during mobility activities with:: Independent with assistive device  Goal: LTG Patient will maintain dynamic standing balance (PT) Description: LTG:  Patient will maintain dynamic standing balance with assistance during mobility activities (PT) Flowsheets (Taken 11/27/2023 1252) LTG: Pt will maintain dynamic standing balance during mobility activities with:: Supervision/Verbal cueing   Problem: Sit to Stand Goal: LTG:  Patient will perform sit to stand with assistance level (PT) Description: LTG:  Patient will perform sit to stand with assistance level (PT) Flowsheets (Taken 11/27/2023 1252) LTG: PT will perform sit to stand in preparation for functional mobility with assistance level: Independent with assistive device   Problem: RH Bed Mobility Goal: LTG Patient will perform bed mobility with assist (PT) Description: LTG: Patient will perform bed mobility with assistance, with/without cues (PT). Flowsheets (Taken 11/27/2023 1252) LTG: Pt will perform bed mobility with assistance level of: Independent with assistive device    Problem: RH Bed to Chair Transfers Goal: LTG Patient will perform bed/chair transfers w/assist (PT) Description: LTG: Patient will perform bed to chair transfers with assistance (PT). Flowsheets (Taken 11/27/2023 1252) LTG: Pt will perform Bed to Chair Transfers with assistance level: Independent with assistive device    Problem: RH Car Transfers Goal: LTG Patient will perform car transfers with assist (PT) Description: LTG: Patient will perform car transfers with assistance (PT). Flowsheets (Taken 11/27/2023 1252) LTG: Pt will perform car transfers with assist:: Supervision/Verbal cueing   Problem:  RH Ambulation Goal: LTG Patient will ambulate in controlled environment (PT) Description: LTG: Patient will ambulate in a controlled environment, # of feet with assistance (PT). Flowsheets (Taken 11/27/2023 1252) LTG: Pt will ambulate in controlled environ  assist needed:: Supervision/Verbal cueing LTG: Ambulation distance in controlled environment: 25 feet with LRAD Goal: LTG Patient will ambulate in home environment (PT) Description: LTG: Patient will ambulate in home environment, # of feet with assistance (PT). Flowsheets (Taken 11/27/2023 1252) LTG: Pt will ambulate in home environ  assist needed:: Supervision/Verbal cueing LTG: Ambulation distance in home environment: 10 feet with LRAD   Problem: RH Wheelchair Mobility Goal: LTG Patient will propel w/c in home environment (PT) Description: LTG: Patient will propel wheelchair in home environment, # of feet with assistance (PT). Flowsheets (Taken 11/27/2023 1252) LTG: Pt will propel w/c in home environ  assist needed:: Independent with assistive device LTG: Propel w/c distance in home environment: 50

## 2023-11-27 NOTE — Progress Notes (Signed)
 Inpatient Rehabilitation  Patient information reviewed and entered into eRehab system by Feliberto Gottron, M.A., CCC-SLP, Rehab Quality Coordinator.  Information including medical coding, functional ability and quality indicators will be reviewed and updated through discharge.

## 2023-11-28 DIAGNOSIS — L03116 Cellulitis of left lower limb: Secondary | ICD-10-CM | POA: Diagnosis not present

## 2023-11-28 DIAGNOSIS — I10 Essential (primary) hypertension: Secondary | ICD-10-CM | POA: Diagnosis not present

## 2023-11-28 DIAGNOSIS — I739 Peripheral vascular disease, unspecified: Secondary | ICD-10-CM | POA: Diagnosis not present

## 2023-11-28 DIAGNOSIS — Z89512 Acquired absence of left leg below knee: Secondary | ICD-10-CM | POA: Diagnosis not present

## 2023-11-28 NOTE — Progress Notes (Signed)
 Physical Therapy Session Note  Patient Details  Name: [name] [name] MRN: [id number] Date of Birth: 1946/01/19  Today's Date: 11/28/2023 PT Individual Time: 1425-1533 PT Individual Time Calculation (min): 68 min   Short Term Goals: Week 1:  PT Short Term Goal 1 (Week 1): STG=LTG 2/2 ELOS  Skilled Therapeutic Interventions/Progress Updates:     Pt seated in WC upon arrival. Pt agreeable to therpay. Pt reports pain in posteiror aspect of L LE residual limb, pt reports pain reduced to 2/10 with repositioning and placement of waffle cusion between leg rest and residual limb.   Education provided for desensitization technique to assist with pain management, tolerance for donning/doffing shrinker/limb protector, future prosthetic.   Pt wearing L LE shrinker today. Doffed shrinker with use of saline syringe. Education/demonstration provided for skin inspection with use of skin inspection mirror on B LE.  Education provided for purpose/frequency of skin inspection. Education provided for recognizing signs/symptoms of pressure injury/infection.   Pt very appreciative of this information.   Pt ambulated 44 feet with RW and hop to gait with CGA with 1 episode of min A when navigating turns for lateral LOB, verbal cues provided for safety with RW with navigating turns.   Added visual cues on WC to assist with independence with donning/doffing leg rests. Pt demonstrates improved understanding but would benefit from continued practice.   Pt seated in [facility] with all needs within reach and chair alarm on.    Therapy Documentation Precautions:  Precautions Precautions: Fall Recall of Precautions/Restrictions: Intact Other Brace: L limb protector Restrictions Weight Bearing Restrictions Per Provider Order: Yes LLE Weight Bearing Per Provider Order: Non weight bearing  Therapy/Group: Individual Therapy  [facility] Jenney Modest, [place], DPT  11/28/2023, 2:32 PM

## 2023-11-28 NOTE — Progress Notes (Signed)
 Occupational Therapy Session Note  Patient Details  Name: [name] [name] MRN: [id number] Date of Birth: [birth year]  Today's Date: 11/28/2023 OT Individual Time: 1120-1204 OT Individual Time Calculation (min): 44 min    Short Term Goals: Week 1:  OT Short Term Goal 1 (Week 1): STG = LTG at MOD I 2/2 ELOS  Skilled Therapeutic Interventions/Progress Updates:    Pt received in w/c ready for therapy.  He self propelled himself to gym and practiced rolling w/c in a fast manner when hallway was clear.  Worked on safe sit to stand techniques by pushing up with B hands first then reaching for walker once hips elevated. Demonstrated safe methods for walking with RW by pushing up with arms and advancing foot in small steps gently landing on foot.  Pt able to sit to stand and ambulate 15 ft to mat with CGA.    On mat he worked on UE strength with 3 lb dowel bar.  He stated the back of his thigh was hurting from the limb guard. Removed guard to check skin.  No increased redness but to help with comfort covered plastic part that touches thigh with mole skin.  Reapplied guard placing higher on limb with increased support to knee. Pt stated this was more comfortable.   Practiced sit to stands from mat pushing up with B hands with light CGA.  Pt then ambulated 30 ft from mat to w/c with CGA.  Pt propelled back to room with all needs met.   Therapy Documentation Precautions:  Precautions Precautions: Fall Recall of Precautions/Restrictions: Intact Other Brace: L limb protector Restrictions Weight Bearing Restrictions Per Provider Order: Yes LLE Weight Bearing Per Provider Order: Non weight bearing   Pain:  No c/o pain  ADL: ADL Eating: Set up Grooming: Setup Upper Body Bathing: Supervision/safety Lower Body Bathing: Contact guard Upper Body Dressing: Supervision/safety Lower Body Dressing: Minimal assistance Toileting: Minimal assistance Toilet Transfer: Minimal assistance      Therapy/Group: Individual Therapy  Sharian Delia 11/28/2023, 1:06 PM

## 2023-11-28 NOTE — Progress Notes (Signed)
 PROGRESS NOTE   Subjective/Complaints: Pt feeling very well. Pleased with his progress in therapy. Pain controlled left leg  ROS: Patient denies fever, rash, sore throat, blurred vision, dizziness, nausea, vomiting, diarrhea, cough, shortness of breath or chest pain, joint or back/neck pain, headache, or mood change.    Objective:   No results found. Recent Labs    11/27/23 0513  WBC 7.0  HGB 10.8*  HCT 30.9*  PLT 255   Recent Labs    11/26/23 1826  NA 136  K 3.8  CL 98  CO2 27  GLUCOSE 147*  BUN 12  CREATININE 0.70  CALCIUM  9.1    Intake/Output Summary (Last 24 hours) at 11/28/2023 0854 Last data filed at 11/28/2023 0513 Gross per 24 hour  Intake 956 ml  Output 750 ml  Net 206 ml        Physical Exam: Vital Signs Blood pressure (!) 148/77, pulse 74, temperature 98 F (36.7 C), temperature source Oral, resp. rate 17, height 5\' 9"  (1.753 m), weight 91.3 kg, SpO2 98%.  Constitutional: No distress . Vital signs reviewed. HEENT: NCAT, EOMI, oral membranes moist Neck: supple Cardiovascular: RRR without murmur. No JVD    Respiratory/Chest: CTA Bilaterally without wheezes or rales. Normal effort    GI/Abdomen: BS +, non-tender, non-distended Ext: no clubbing, cyanosis, or edema Psych: pleasant and cooperative  Skin: left BK incision intact with sl central serosang dc.Erythema and warmth around wound and proximally across knee appears to be improving. Neuro:  Alert and oriented x 3. Normal insight and awareness. Intact Memory. Normal language and speech. Cranial nerve exam unremarkable. MMT: BUE 5/5 prox to distal. .RLE 4/5. LLE 3/5hf and ke. Sensory exam normal for light touch and pain in all 4 limbs. No limb ataxia or cerebellar signs. No abnormal tone appreciated.   Musculoskeletal: left BK area still swollen and somewhat, albeit improved, sensitive to touch.     Assessment/Plan: 1. Functional deficits which  require 3+ hours per day of interdisciplinary therapy in a comprehensive inpatient rehab setting. Physiatrist is providing close team supervision and 24 hour management of active medical problems listed below. Physiatrist and rehab team continue to assess barriers to discharge/monitor patient progress toward functional and medical goals  Care Tool:  Bathing    Body parts bathed by patient: Right arm, Left arm, Chest, Abdomen, Front perineal area, Buttocks, Right upper leg, Left upper leg, Right lower leg, Face     Body parts n/a: Left lower leg   Bathing assist Assist Level: Contact Guard/Touching assist     Upper Body Dressing/Undressing Upper body dressing   What is the patient wearing?: Pull over shirt    Upper body assist Assist Level: Supervision/Verbal cueing    Lower Body Dressing/Undressing Lower body dressing      What is the patient wearing?: Pants     Lower body assist Assist for lower body dressing: Minimal Assistance - Patient > 75%     Toileting Toileting    Toileting assist Assist for toileting: Minimal Assistance - Patient > 75%     Transfers Chair/bed transfer  Transfers assist     Chair/bed transfer assist level: Contact Guard/Touching assist  Locomotion Ambulation   Ambulation assist      Assist level: Contact Guard/Touching assist Assistive device: Walker-rolling Max distance: 20   Walk 10 feet activity   Assist     Assist level: Contact Guard/Touching assist Assistive device: Walker-rolling   Walk 50 feet activity   Assist Walk 50 feet with 2 turns activity did not occur: Safety/medical concerns (fatigue)         Walk 150 feet activity   Assist Walk 150 feet activity did not occur: Safety/medical concerns (fatigue)         Walk 10 feet on uneven surface  activity   Assist Walk 10 feet on uneven surfaces activity did not occur: Safety/medical concerns         Wheelchair     Assist Is the  patient using a wheelchair?: Yes Type of Wheelchair: Manual    Wheelchair assist level: Supervision/Verbal cueing Max wheelchair distance: 150+    Wheelchair 50 feet with 2 turns activity    Assist        Assist Level: Supervision/Verbal cueing   Wheelchair 150 feet activity     Assist      Assist Level: Supervision/Verbal cueing   Medical Problem List and Plan: 1. Functional deficits secondary to PAD and left BKA             -patient may shower if left BKA site is covered             -ELOS/Goals: 10-12 days with supervision goals for PT and OT             -Limb guard when OOB  -Continue CIR therapies including PT and OT. Interdisciplinary team conference today to discuss goals, barriers to discharge, and dc planning.  2.  Antithrombotics: -DVT/anticoagulation:  Pharmaceutical: Heparin  5000U q8h             -antiplatelet therapy: ASA 81mg  daily 3. Pain Management: robaxin PRN. Oxy-APAP 5-325mg  1-2 tabs q4h PRN -only using prn robaxin rarely -tylenol  prn 4. Mood/Behavior/Sleep/hx of PTSD and depression/anxiety: LCSW to follow for evaluation and support.              -antipsychotic agents: N/A             -Continue Bupropion  150mg  XR daily, Duloxetine  30mg  daily. Also has service dog.              -Melatonin 5mg  PRN--using with good results 5. Neuropsych/cognition: This patient is capable of making decisions on his own behalf. 6. Skin/Wound Care:  dry dressing with kerlix, ACE. Change daily and as aneeded 7. Fluids/Electrolytes/Nutrition:  . Continue supplements and vitamins.   -albumin  is low.  -encouraged PO, protein supps 8. HTN: home meds Lisinopril  10mg  daily             -BPs have been fine postop, monitor with increased mobility 9. Cellulitis?: Residual limb edematous and tight with erythema extendin medially to mid thigh. Keflex initiated by PA for wound prophylaxis.              -will continue for now as pt tolerating med without problems   -wbc's normal  at 7  6/4- stump area does look better today--continue keflex 10. CAD s/p PCI: Monitor for symptoms with increase in activity  11. 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 Constipation: Senna added  13.HLD: continue Atorvastatin  80mg  daily 14. BPH: continue Flomax  0.4mg  daily 15. ABLA: Monitor for signs of bleeding. Recheck CBC in am.  16.  Hyponatremia: improved-->136 6/2 (lab drawn in evening).  17. GERD/GI ppx: Managed with Protonix  40mg  daily    LOS: 2 days A FACE TO FACE EVALUATION WAS PERFORMED  Rawland Caddy 11/28/2023, 8:54 AM    pr

## 2023-11-28 NOTE — Progress Notes (Signed)
 Patient ID: [name] [name], male   DOB: 1946-04-22, 78 y.o.   MRN: [id number]  Met with pt to give team conference update regarding goals of mod/I-supervision level and target discharge date of 6/10. He felt this will give him time to get the ramp  done [facility] at home and make sure his wound is better. Has all DME and will await follow up recommendations

## 2023-11-28 NOTE — Progress Notes (Signed)
 Occupational Therapy Session Note  Patient Details  Name: [name] [name] MRN: [id number] Date of Birth: Jul 21, 1945  Session 1 Today's Date: 11/28/2023 OT Individual Time: 0815-0900 OT Individual Time Calculation (min): 45 min    Session 2 Today's Date: 11/28/2023 OT Individual Time: 0347-4259 OT Individual Time Calculation (min): 71 min    Short Term Goals: Week 1:  OT Short Term Goal 1 (Week 1): STG = LTG at MOD I 2/2 ELOS  Skilled Therapeutic Interventions/Progress Updates:    Session 1 Session focused on limb desensitization, limb loss education, and w/c management. Pt in w/c with no c/o pain. He was reaching to his bed looking for his phone with his residual limb dangling unsupported and w/c unlocked. Provided limb loss education, focus on positioning for prosthetic prep and contracture risk reduction. Also edu on importance of locking w/c before reaching. Pt very receptive to all education. Removed ace wrap and gauze. Two areas of drainage-  PA aware. Observed the limb uncovered for several minutes to assess drainage and decided to apply a small gauze pad with the shrinker overtop. Provided edu on shrinker use and indication. Max A to don. Donned limb guard as well. Pt able to complete desensitization techniques while limb was uncovered. RN in room and did remove a stray staple that was barely still in skin. Pt ended session with 250 ft of w/c propulsion with OT providing cueing/education on proper propulsion technique for shoulder integrity and efficiency. Pt returned to his room and was left sitting up with all needs met.    Session 2 Pt received sitting up with no c/o pain, agreeable to OT session. He propelled the w/c to the therapy gym. Stand pivot to the therapy mat with CGA using the RW. Min cueing for hand placement with the RW. He completed dynamic reaching task in standing to challenge dynamic standing balance and to simulate toileting/LB dressing task. After reaching behind  him, he was able to briefly remove BUE from the RW to pass the beanbag between his hands to challenge balance. He was able to throw beanbags. Pt then completed blocked practice sit <> stand, 3x10 repetitions with BUE support, to challenge generalized strengthening for ADL transfers, as well as increasing functional activity tolerance and cardiorespiratory endurance. Pt required CGA overall. Pt required use of rest breaks throughout session for recovery, as well as to support safety and prevent overexertion. During breaks, OT monitored recovery time to assess endurance and response to exertion. Mirror therapy completed and pt completed RLE AROM and guided reach to reduce LLE phantom pain and sensation. Pt transitioned into prone on the mat and completed gentle residual limb knee flexion and extension. Pt reporting pain and tightness in his hamstring that was alleviated with more movement and rest. He returned to his room and was left sitting up with all needs met.    Therapy Documentation Precautions:  Precautions Precautions: Fall Recall of Precautions/Restrictions: Intact Other Brace: L limb protector Restrictions Weight Bearing Restrictions Per Provider Order: Yes LLE Weight Bearing Per Provider Order: Non weight bearing   Therapy/Group: Individual Therapy  Una Ganser 11/28/2023, 9:38 AM

## 2023-11-28 NOTE — Patient Care Conference (Signed)
 Inpatient RehabilitationTeam Conference and Plan of Care Update Date: 11/28/2023   Time: 12:06 PM    Patient Name: [name] [name]      Medical Record Number: [id number]  Date of Birth: Jan 18, 1946 Sex: Male         Room/Bed: 4W13C/4W13C-01 Payor Info: Payor: [employer] / Plan: [facility] / Product Type: *No Product type* /    Admit Date/Time:  11/26/2023  4:01 PM  Primary Diagnosis:  S/P BKA (below knee amputation), left [facility])  Hospital Problems: Principal Problem:   S/P BKA (below knee amputation), left [facility]) Active Problems:   Essential hypertension    Expected Discharge Date: Expected Discharge Date: 12/04/23  Team Members Present: Physician leading conference: Dr. Laverle Postin Social Worker Present: Adrianna Albee, LCSW Nurse Present: Forrestine Ike, RN PT Present: Jenney Modest, PT OT Present: Tim Fontana, OT     Current Status/Progress Goal Weekly Team Focus  Bowel/Bladder      Continent of bowel and bladder          Swallow/Nutrition/ Hydration               ADL's   Supervision UB, Min A LB and toileting   Mod I   Barriers- dynamic standing balance, GM coordination, functional endurance, limb care edu    Mobility   bed mobility supervision, sit to stand, stand pivot transfer with RW CGA, gait x20 feet with RW and CGA with WC in tow, car transfer with CGA   mod I/supervisoin  D/C 6/10? HHPT DME: pt has WC, RW, pt reports [facility], barriers to D/C: home entry, wound drainage, pt bathroom inacessibile with WC ; work on independence with mobility, discharge planning, family training,    Communication                Safety/Cognition/ Behavioral Observations               Pain      N/a          Skin     Staples to incision, ace bandage due to bleeding and limb guard    Incision healing    Incision check q shift for drainage and s/s of infection    Discharge Planning:  Home with wife who is  able to assist. [facility] assisting with building [facility] and eventual bathroom remodel. Has all needed equipment   Team Discussion: Patient post left BKA with HTN, hyponatremia and BPH with PTSD; service dog. Functional progress limited by poor endurance.  Patient on target to meet rehab goals: yes, currently needs supervision for upper body care and min assist for lower body care.  Needs CGA for sit - stand and stand pivot transfers and ambulating up to 20'.  Goals for discharge set for mod I overall.  *See Care Plan and progress notes for long and short-term goals.   Revisions to Treatment Plan:  N/a   Teaching Needs: Safety, medications, skin care/incision care, residual limb care, transfers, toileting, etc.   Current Barriers to Discharge: Decreased caregiver support, Home enviroment access/layout, and Weight bearing restrictions  Possible Resolutions to Barriers: Family education Ramp for entry to home DME:[facility]     Medical Summary Current Status: s/p left bka. pain controlld. pt with ? cellulitis of limb/incisonal area. mood very positive.  Barriers to Discharge: Medical stability   Possible Resolutions to Barriers/Weekly Focus: pt placed on keflex for potential cellulitis. continue regular dressing to leg with compressive ACE wrap.  Continued Need for Acute Rehabilitation Level of Care: The patient requires daily medical management by a physician with specialized training in physical medicine and rehabilitation for the following reasons: Direction of a multidisciplinary physical rehabilitation program to maximize functional independence : Yes Medical management of patient stability for increased activity during participation in an intensive rehabilitation regime.: Yes Analysis of laboratory values and/or radiology reports with any subsequent need for medication adjustment and/or medical intervention. : Yes   I attest that I was present, lead the team conference, and concur with the  assessment and plan of the team.   Forrestine Ike B 11/28/2023, 3:44 PM

## 2023-11-28 NOTE — Plan of Care (Signed)
  Problem: RH BOWEL ELIMINATION Goal: RH STG MANAGE BOWEL WITH ASSISTANCE Description: STG Manage Bowel with toileting Assistance. Outcome: Progressing Goal: RH STG MANAGE BOWEL W/MEDICATION W/ASSISTANCE Description: STG Manage Bowel with Medication with mod I Assistance. Outcome: Progressing   Problem: RH SAFETY Goal: RH STG ADHERE TO SAFETY PRECAUTIONS W/ASSISTANCE/DEVICE Description: STG Adhere to Safety Precautions With cues Assistance/Device. Outcome: Progressing   Problem: RH PAIN MANAGEMENT Goal: RH STG PAIN MANAGED AT OR BELOW PT'S PAIN GOAL Description: < 4 with prns Outcome: Progressing   Problem: RH KNOWLEDGE DEFICIT LIMB LOSS Goal: RH STG INCREASE KNOWLEDGE OF SELF CARE AFTER LIMB LOSS Description: Patient and wife will be able to manage care at discharge using educational resources for medications, skin care and dietary modification independently Outcome: Progressing

## 2023-11-29 DIAGNOSIS — I10 Essential (primary) hypertension: Secondary | ICD-10-CM | POA: Diagnosis not present

## 2023-11-29 DIAGNOSIS — Z89512 Acquired absence of left leg below knee: Secondary | ICD-10-CM | POA: Diagnosis not present

## 2023-11-29 DIAGNOSIS — I739 Peripheral vascular disease, unspecified: Secondary | ICD-10-CM | POA: Diagnosis not present

## 2023-11-29 DIAGNOSIS — L03116 Cellulitis of left lower limb: Secondary | ICD-10-CM | POA: Diagnosis not present

## 2023-11-29 LAB — CBC WITH DIFFERENTIAL/PLATELET
Abs Immature Granulocytes: 0.08 10*3/uL — ABNORMAL HIGH (ref 0.00–0.07)
Basophils Absolute: 0.1 10*3/uL (ref 0.0–0.1)
Basophils Relative: 1 %
Eosinophils Absolute: 0.2 10*3/uL (ref 0.0–0.5)
Eosinophils Relative: 2 %
HCT: 31.1 % — ABNORMAL LOW (ref 39.0–52.0)
Hemoglobin: 10.7 g/dL — ABNORMAL LOW (ref 13.0–17.0)
Immature Granulocytes: 1 %
Lymphocytes Relative: 18 %
Lymphs Abs: 1.7 10*3/uL (ref 0.7–4.0)
MCH: 30.5 pg (ref 26.0–34.0)
MCHC: 34.4 g/dL (ref 30.0–36.0)
MCV: 88.6 fL (ref 80.0–100.0)
Monocytes Absolute: 0.9 10*3/uL (ref 0.1–1.0)
Monocytes Relative: 10 %
Neutro Abs: 6.5 10*3/uL (ref 1.7–7.7)
Neutrophils Relative %: 68 %
Platelets: 321 10*3/uL (ref 150–400)
RBC: 3.51 MIL/uL — ABNORMAL LOW (ref 4.22–5.81)
RDW: 11.9 % (ref 11.5–15.5)
WBC: 9.3 10*3/uL (ref 4.0–10.5)
nRBC: 0 % (ref 0.0–0.2)

## 2023-11-29 LAB — BASIC METABOLIC PANEL WITH GFR
Anion gap: 8 (ref 5–15)
BUN: 14 mg/dL (ref 8–23)
CO2: 28 mmol/L (ref 22–32)
Calcium: 9.1 mg/dL (ref 8.9–10.3)
Chloride: 103 mmol/L (ref 98–111)
Creatinine, Ser: 0.73 mg/dL (ref 0.61–1.24)
GFR, Estimated: >60 mL/min (ref >60)
Glucose, Bld: 128 mg/dL — ABNORMAL HIGH (ref 70–99)
Potassium: 3.6 mmol/L (ref 3.5–5.1)
Sodium: 139 mmol/L (ref 135–145)

## 2023-11-29 NOTE — Plan of Care (Signed)
  Problem: Consults Goal: RH LIMB LOSS PATIENT EDUCATION Description: Description: See Patient Education module for eduction specifics. Outcome: Progressing   Problem: RH BOWEL ELIMINATION Goal: RH STG MANAGE BOWEL WITH ASSISTANCE Description: STG Manage Bowel with toileting Assistance. Outcome: Progressing Goal: RH STG MANAGE BOWEL W/MEDICATION W/ASSISTANCE Description: STG Manage Bowel with Medication with mod I Assistance. Outcome: Progressing   Problem: RH SAFETY Goal: RH STG ADHERE TO SAFETY PRECAUTIONS W/ASSISTANCE/DEVICE Description: STG Adhere to Safety Precautions With cues Assistance/Device. Outcome: Progressing   Problem: RH PAIN MANAGEMENT Goal: RH STG PAIN MANAGED AT OR BELOW PT'S PAIN GOAL Description: < 4 with prns Outcome: Progressing   Problem: RH KNOWLEDGE DEFICIT LIMB LOSS Goal: RH STG INCREASE KNOWLEDGE OF SELF CARE AFTER LIMB LOSS Description: Patient and wife will be able to manage care at discharge using educational resources for medications, skin care and dietary modification independently Outcome: Progressing

## 2023-11-29 NOTE — Progress Notes (Signed)
 Physical Therapy Session Note  Patient Details  Name: [name] [name] MRN: [id number] Date of Birth: May 31, 1946  Today's Date: 11/29/2023 PT Individual Time: 1015-1125 PT Individual Time Calculation (min): 70 min   Short Term Goals: Week 1:  PT Short Term Goal 1 (Week 1): STG=LTG 2/2 ELOS  Skilled Therapeutic Interventions/Progress Updates:      Treatment Session 1  Pt seated in WC upon arrival. Pt agreeable to therapy. Pt reports mild soreness on posterior aspect of residual limb but endorses it is improved in comparison to yesterday. Nurse present to administer pain medication upon pt request. Pt reports he slept better last night.  Pt navigated ramp with WC x3 with B UE, verbal cues provided for technique.   Pt performed stand pivot transfer without RW with UE positioning on arm rest of WC with CGA progressing to supervision, vebral cues provided for UE positioning/technique.   Pt performed the following activities to progress static/dynamic balance:  Multidirectional reaching to organize colored dots in numerical order, in groups by color, in groups by odd/even number--with CGA with unilateral UE support on RW  Standing clapping x10 with RW and CGA/min A 2/2 posterior bias/retropulsion  Standing alternating reaching overhead with unilateral UE support on RW and CGA/intermittent min A for posteiror bais  Standing alternating cross body reaching with unilatera UE support on RW and CGA.   Pt ambualted ~30 feet with RW and CGA, verbal cues provided for technique for and light landing with hop to gait.   Pt seated in [facility] with all needs within reach and chair alarm on.      Therapy Documentation Precautions:  Precautions Precautions: Fall Recall of Precautions/Restrictions: Intact Other Brace: L limb protector Restrictions Weight Bearing Restrictions Per Provider Order: Yes LLE Weight Bearing Per Provider Order: Non weight bearing  Therapy/Group: Individual Therapy  [facility] [place], [place], DPT  11/29/2023, 7:52 AM

## 2023-11-29 NOTE — Progress Notes (Signed)
 Occupational Therapy Session Note  Patient Details  Name: [name] [name] MRN: [id number] Date of Birth: [birth year]  Today's Date: 11/29/2023 OT Individual Time: 4098-1191 OT Individual Time Calculation (min): 56 min    Short Term Goals: Week 1:  OT Short Term Goal 1 (Week 1): STG = LTG at MOD I 2/2 ELOS  Skilled Therapeutic Interventions/Progress Updates:    Patient received seated in wheelchair eager to shower.  Patient gathered his supplies and removed the arm of his wheelchair in anticipation of pivot transfer to shower bench.  Patient knew to cover incision for shower.  Patient showered and dressed without physical assistance and was pleased with his progress.  Patient managed wheelchair to navigate downhill out of bathroom, and set himself up to complete grooming at sink.  Problem solved how he will do this at home as he cannot maneuver under sink.  Discussed short term orienting self sideways, and then hopefully long term he will have prosthetic leg and will be standing for grooming.  Patient very excited about impending discharge.  Left up in wheelchair at end of session with personal items in reach.    Therapy Documentation Precautions:  Precautions Precautions: Fall Recall of Precautions/Restrictions: Intact Other Brace: L limb protector Restrictions Weight Bearing Restrictions Per Provider Order: Yes LLE Weight Bearing Per Provider Order: Non weight bearing   Pain: Pain Assessment Pain Scale: 0-10 Pain Score: 0       Therapy/Group: Individual Therapy  Octa Uplinger M 11/29/2023, 3:28 PM

## 2023-11-29 NOTE — Group Note (Signed)
 Patient Details Name: [name] [name] MRN: [id number] DOB: 12/19/1945 Today's Date: 11/29/2023  Time Calculation: OT Group Time Calculation OT Group Start Time: 1430 OT Group Stop Time: 1530 OT Group Time Calculation (min): 60 min      Group Description: Dance Group: Pt participated in dance group with an emphasis on social interaction, motor planning, increasing overall activity tolerance and bimanual tasks. All songs were selected by group members. Dance moves included AROM of BUE/BLE gross motor movements with an emphasis on building functional endurance.    Individual level documentation: Patient completed group from sitting level. Patientt needed supervision to complete various dance moves with OT providing visual model Patient needed min modifications during group.  Pain:  0/10  Precautions:  Falls, limb guard   [name] 11/29/2023, 3:56 PM

## 2023-11-29 NOTE — Progress Notes (Signed)
 PROGRESS NOTE   Subjective/Complaints: Pt in good spirits. Pain controlled. Wearing stump shrinker. Pleased with progress  ROS: Patient denies fever, rash, sore throat, blurred vision, dizziness, nausea, vomiting, diarrhea, cough, shortness of breath or chest pain,  headache, or mood change.    Objective:   No results found. Recent Labs    11/27/23 0513 11/29/23 0549  WBC 7.0 9.3  HGB 10.8* 10.7*  HCT 30.9* 31.1*  PLT 255 321   Recent Labs    11/26/23 1826 11/29/23 0549  NA 136 139  K 3.8 3.6  CL 98 103  CO2 27 28  GLUCOSE 147* 128*  BUN 12 14  CREATININE 0.70 0.73  CALCIUM  9.1 9.1    Intake/Output Summary (Last 24 hours) at 11/29/2023 0933 Last data filed at 11/29/2023 0835 Gross per 24 hour  Intake 680 ml  Output 1600 ml  Net -920 ml        Physical Exam: Vital Signs Blood pressure 103/63, pulse 66, temperature 98.2 F (36.8 C), resp. rate 18, height 5\' 9"  (1.753 m), weight 91.3 kg, SpO2 100%.  Constitutional: No distress . Vital signs reviewed. HEENT: NCAT, EOMI, oral membranes moist Neck: supple Cardiovascular: RRR without murmur. No JVD    Respiratory/Chest: CTA Bilaterally without wheezes or rales. Normal effort    GI/Abdomen: BS +, non-tender, non-distended Ext: no clubbing, cyanosis, or edema Psych: pleasant and cooperative  Skin: left BK incision intact with sl central area with dry gauze and dried blood. .Erythema and warmth around wound and proximally across knee is present but decreased.  Neuro:  Alert and oriented x 3. Normal insight and awareness. Intact Memory. Normal language and speech. Cranial nerve exam unremarkable. MMT: BUE 5/5 prox to distal. .RLE 4/5. LLE 3/5hf and ke. Sensory exam normal for light touch and pain in all 4 limbs. No limb ataxia or cerebellar signs. No abnormal tone appreciated.  Good siting balance Musculoskeletal: left BK area less tender. Swelling decreased with  shrinker   Assessment/Plan: 1. Functional deficits which require 3+ hours per day of interdisciplinary therapy in a comprehensive inpatient rehab setting. Physiatrist is providing close team supervision and 24 hour management of active medical problems listed below. Physiatrist and rehab team continue to assess barriers to discharge/monitor patient progress toward functional and medical goals  Care Tool:  Bathing    Body parts bathed by patient: Right arm, Left arm, Chest, Abdomen, Front perineal area, Buttocks, Right upper leg, Left upper leg, Right lower leg, Face     Body parts n/a: Left lower leg   Bathing assist Assist Level: Contact Guard/Touching assist     Upper Body Dressing/Undressing Upper body dressing   What is the patient wearing?: Pull over shirt    Upper body assist Assist Level: Supervision/Verbal cueing    Lower Body Dressing/Undressing Lower body dressing      What is the patient wearing?: Pants     Lower body assist Assist for lower body dressing: Minimal Assistance - Patient > 75%     Toileting Toileting    Toileting assist Assist for toileting: Minimal Assistance - Patient > 75%     Transfers Chair/bed transfer  Transfers assist  Chair/bed transfer assist level: Contact Guard/Touching assist     Locomotion Ambulation   Ambulation assist      Assist level: Contact Guard/Touching assist Assistive device: Walker-rolling Max distance: 20   Walk 10 feet activity   Assist     Assist level: Contact Guard/Touching assist Assistive device: Walker-rolling   Walk 50 feet activity   Assist Walk 50 feet with 2 turns activity did not occur: Safety/medical concerns (fatigue)         Walk 150 feet activity   Assist Walk 150 feet activity did not occur: Safety/medical concerns (fatigue)         Walk 10 feet on uneven surface  activity   Assist Walk 10 feet on uneven surfaces activity did not occur: Safety/medical  concerns         Wheelchair     Assist Is the patient using a wheelchair?: Yes Type of Wheelchair: Manual    Wheelchair assist level: Supervision/Verbal cueing Max wheelchair distance: 150+    Wheelchair 50 feet with 2 turns activity    Assist        Assist Level: Supervision/Verbal cueing   Wheelchair 150 feet activity     Assist      Assist Level: Supervision/Verbal cueing   Medical Problem List and Plan: 1. 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  -we discussed timeline for prosthesis today  2.  Antithrombotics: -DVT/anticoagulation:  Pharmaceutical: Heparin  5000U q8h             -antiplatelet therapy: ASA 81mg  daily 3. Pain Management: robaxin PRN. Oxy-APAP 5-325mg  1-2 tabs q4h PRN -only using prn robaxin rarely -tylenol  prn 4. Mood/Behavior/Sleep/hx of PTSD and depression/anxiety: LCSW to follow for evaluation and support.              -antipsychotic agents: N/A             -Continue Bupropion  150mg  XR daily, Duloxetine  30mg  daily. Also has service dog.              -Melatonin 5mg  PRN--using with good results 5. Neuropsych/cognition: This patient is capable of making decisions on his own behalf. 6. Skin/Wound Care: 6/5  can keep small piece of gauze on incision for now as the dried s/s drainage within it has adhered to incision. The rest of area looks better   -maintain shrinker for compression and protection 7. Fluids/Electrolytes/Nutrition:  . Continue supplements and vitamins.   -albumin  is low.  -encouraged PO, protein supps 8. HTN: home meds Lisinopril  10mg  daily             -BPs have been fine postop, monitor with increased mobility 9. Cellulitis?: Residual limb edematous and tight with erythema  extendin medially to mid thigh. Keflex initiated by PA for wound prophylaxis.              -will continue for now as pt tolerating med without problems   -wbc's normal at 7  6/5--continue keflex for 5 days total and then reassess 10. CAD s/p PCI: Monitor for symptoms with increase in activity  11. 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 Constipation: Senna added  13.HLD: continue Atorvastatin  80mg  daily 14. BPH: continue Flomax  0.4mg  daily  -voiding without issues 15. ABLA: pt only with mild drainage from incision  -6/5 hgb 10.7 today 16. Hyponatremia: improved-->139 6/5 17. GERD/GI ppx: Managed with Protonix  40mg  daily    LOS: 3 days A FACE TO FACE EVALUATION WAS PERFORMED  Rawland Caddy 11/29/2023, 9:33 AM    pr

## 2023-11-29 NOTE — IPOC Note (Signed)
 Overall Plan of Care [facility]) Patient Details Name: [name] [name] MRN: [id number] DOB: Feb 19, 1946  Admitting Diagnosis: S/P BKA (below knee amputation), left [facility])  Hospital Problems: Principal Problem:   S/P BKA (below knee amputation), left (HCC) Active Problems:   Essential hypertension     Functional Problem List: Nursing Bowel, Endurance, Pain, Edema, Safety, Medication Management  PT Balance, Edema, Endurance, Motor, Pain, Safety, Sensory, Skin Integrity  OT Balance, Edema, Endurance, Motor, Perception, Safety, Sensory, Skin Integrity  SLP    TR         Basic ADL's: OT Grooming, Bathing, Dressing, Toileting     Advanced  ADL's: OT Simple Meal Preparation     Transfers: PT Bed Mobility, Bed to Chair, Car, Occupational psychologist, Research scientist (life sciences): PT Ambulation, Wheelchair Mobility     Additional Impairments: OT None  SLP        TR      Anticipated Outcomes Item Anticipated Outcome  Self Feeding no goal  Swallowing      Basic self-care  MOD I  Toileting  MOD I   Bathroom Transfers MOD I  Bowel/Bladder  manage bowel w mod I assist  Transfers  mod I  Locomotion  supervision  Communication     Cognition     Pain  Pain < 4with prns  Safety/Judgment  manage w cues   Therapy Plan: PT Intensity: Minimum of 1-2 x/day ,45 to 90 minutes PT Frequency: 5 out of 7 days PT Duration Estimated Length of Stay: 7-10 OT Intensity: Minimum of 1-2 x/day, 45 to 90 minutes OT Frequency: 5 out of 7 days OT Duration/Estimated Length of Stay: 7-10 days     Team Interventions: Nursing Interventions Disease Management/Prevention, Medication Management, Discharge Planning, Pain Management, Bowel Management  PT interventions Ambulation/gait training, Community reintegration, DME/adaptive equipment instruction, Neuromuscular re-education, Psychosocial support, Stair training, UE/LE Strength taining/ROM, Wheelchair propulsion/positioning, Designer, jewellery, Discharge planning, Pain management, Therapeutic Activities, Skin care/wound management, UE/LE Coordination activities, Cognitive remediation/compensation, Disease management/prevention, Functional mobility training, Patient/family education, Splinting/orthotics, Therapeutic Exercise, Visual/perceptual remediation/compensation  OT Interventions Balance/vestibular training, Self Care/advanced ADL retraining, UE/LE Coordination activities, Functional mobility training, Skin care/wound managment, Community reintegration, Neuromuscular re-education, Splinting/orthotics, Wheelchair propulsion/positioning, Discharge planning, Pain management, Therapeutic Activities, Disease mangement/prevention, Patient/family education, Therapeutic Exercise, DME/adaptive equipment instruction, Psychosocial support, UE/LE Strength taining/ROM  SLP Interventions    TR Interventions    SW/CM Interventions Discharge Planning, Psychosocial Support, Patient/Family Education   Barriers to Discharge MD  Medical stability  Nursing Decreased caregiver support, Home environment access/layout, Weight bearing restrictions 1 level 2 ste; bil rails w spouse; has a Occupational hygienist (4 wheels), [employer] - single point, Information systems manager, Art gallery manager, Wheelchair - manual, Other (comment) ([facility] and measuring for a ramp)  PT Inaccessible home environment, Decreased caregiver support, Home environment access/layout, Weight, Weight bearing restrictions, Wound Care    OT Wound Care    SLP      SW       Team Discharge Planning: Destination: PT-Home ,OT- Home , SLP-  Projected Follow-up: PT-Outpatient PT, Home health PT, OT-  Home health OT, SLP-  Projected Equipment Needs: PT- , OT- To be determined, SLP-  Equipment Details: PT-pt has electric and manual WC, RW, cane, BSC, shower chair, OT-  Patient/family involved in discharge planning: PT- Patient, Family member/caregiver,  OT-Patient, SLP-   MD ELOS:  12/04/23 Medical Rehab Prognosis:  Excellent Assessment: The patient has been admitted for CIR therapies  with the diagnosis of left BKA. The team will be addressing functional mobility, strength, stamina, balance, safety, adaptive techniques and equipment, self-care, bowel and bladder mgt, patient and caregiver education, pre-prosthetic education, pain control, community reentry. Goals have been set at mod I for mobility and self-care. Anticipated discharge destination is home with wife. Pt very motivated!.        See Team Conference Notes for weekly updates to the plan of care

## 2023-11-30 DIAGNOSIS — Z89512 Acquired absence of left leg below knee: Secondary | ICD-10-CM | POA: Diagnosis not present

## 2023-11-30 NOTE — Progress Notes (Signed)
 Physical Therapy Session Note  Patient Details  Name: [name] [name] MRN: [id number] Date of Birth: 1945/07/25  Today's Date: 11/30/2023 PT Individual Time: 1001-1121 PT Individual Time Calculation (min): 80 min   Short Term Goals: Week 1:  PT Short Term Goal 1 (Week 1): STG=LTG 2/2 ELOS  Skilled Therapeutic Interventions/Progress Updates:  Patient seated upright and self-propelling w/c with OT on completion of OT session. Care taken over from OT. Pt related that he was outside with OT for session and requests to go back outside for PT. D/t good weather and pt in good spirits from being outside, session moved outside.  Patient with no pain complaint at start of session, but relates minor discomfort at postero-lateral aspect of residual limb with prolonged pressure from amputee pad/ leg rest on w/c. Adjusted limb guard which slightly changed the pressure point on limb inside guard and expressed minor relief. Found significantly improved relief from resting limb guard on horizontal bar of RW during rest breaks this session. This took complete pressure off of end of limb.   During rest breaks, pt related recent medical history and then had many questions re: moving forward from this point with acquiring prosthetic limb. Related info re: timeline including staple removal, continuation of wear of shrinker in order to shape end pf residual limb. Once shaped and healed, molding of residual limb for socket manufacture. Shaping and fitting of socket for ultimate comfort with pressure into limb, PT for gradual weight bearing and progression to walking. Pt with questions re: how limb attaches to leg and explained re: silicone sleeve with pin. Also explained that services for prosthetic care through [place] will either be through [place] clinic or [place] [facility]. IF not through the [place] then there is a clinic in [facility] that could be utilized as well [facility]    Therapeutic Activity: Transfers: Pt performed sit<>stand and stand pivot transfers throughout session with close supervision. Pt is quickly able to attain balance over RLE. Using Steady UB, pt easily lift LE to complete pivot turns with gentle foot contact. No cueing required for technique.   Gait Training/ NMR:  GT/ NMR facilitated during session with focus on standing balance, dynamic gait and problem solving. Pt guided in outdoor ambulation over uneven brick patio. Pt is able to cover 75 ft while stopping to grasp 1# weighted balls balanced on top of narrow cones and passed off to therapist with hand off over opposite shoulders, around back, and over head with each UE. Is able to maintain balance throughout. Good ambulation throughout GT/ NMR with only 2 misplaced steps too far forward causing minor LOB that pt is able to self correct with reposition of R foot placement into BOS.   Pt then guided in single cone collection and then placement of cone onto called color discs placed on tables at other end of patio. Pt is able to complete 2 cones covering distance of ~100 feet prior to fatigue and need for seated rest break. Then 2 more cones completed prior to fatigue and request to sit.    NMR performed for improvements in motor control and coordination, balance, sequencing, judgement, and self confidence/ efficacy in performing all aspects of mobility at highest level of independence.    Wheelchair Mobility:  Pt propelled wheelchair >800 feet with supervision/ Mod I. W/C mobility performed with pt able to roll over uneven bricked patio, through breezeway outside of [facility], and into auto doors over outdoor carpet,  2 sliding door thresholds, and floor mat. Crosses atrium, maneuvers to elevator doors, into elevator, turns in place, and then out of elevator and all the way back to room with no lengthy rest breaks. No cueing required for technique and all obstacles/ corners cleared. Once in room, is able to  maneuver tighter space to back into position next to bed.   Patient seated upright in w/c at end of session with brakes locked, belt alarm set, and all needs within reach.  Therapy Documentation Precautions:  Precautions Precautions: Fall Recall of Precautions/Restrictions: Intact Other Brace: L limb protector Restrictions Weight Bearing Restrictions Per Provider Order: Yes LLE Weight Bearing Per Provider Order: Non weight bearing  Pain:  Minimal pain related in residual limb this session and pt is able to provide pressure relief as needed.    Therapy/Group: Individual Therapy  Donne Gage PT, DPT, CSRS 11/30/2023, 1:35 PM

## 2023-11-30 NOTE — Progress Notes (Signed)
 Physical Therapy Group Session Note  Patient Details  Name: [name] [name] MRN: [id number] Date of Birth: 01/21/1946  Today's Date: 11/30/2023 PT Group Time: 1300-1400 PT Group Time Calculation (min): 60 min  Short Term Goals: Week 1:  PT Short Term Goal 1 (Week 1): STG=LTG 2/2 ELOS  Skilled Therapeutic Interventions/Progress Updates:    Pt participated in limb loss group in social setting with emphasis on stress management and coping strategies to manage new diagnosis to allow for improved mental health to improve overall quality of life. Provided active listening, emotional support and therapeutic use of self. Education provided on pain management including desensitization techniques and mirror therapy, and prosthetic fitting timeline. Issued and reviewed handouts on balanced amputee wellness and educational communities, amputee support group of the triad, contracture prevention, limb protector, and shrinker wear/care guidelines. Also discussed activity modification in relation to pt's goals and hobbies. Pt actively engaged throughout and appreciative of group session.   Therapy Documentation Precautions:  Precautions Precautions: Fall Recall of Precautions/Restrictions: Intact Other Brace: L limb protector Restrictions Weight Bearing Restrictions Per Provider Order: Yes LLE Weight Bearing Per Provider Order: Non weight bearing   Therapy/Group: Group Therapy Nicolas Barren Zaunegger Nena Bank PT, DPT 11/30/2023, 12:17 PM

## 2023-11-30 NOTE — Progress Notes (Signed)
 PROGRESS NOTE   Subjective/Complaints: Denies pain Working with [name] [name] OT Wife is visiting tomorrow and he would like to know the schedule for tomorrow.  ROS: Patient denies fever, rash, sore throat, blurred vision, dizziness, nausea, vomiting, diarrhea, cough, shortness of breath or chest pain,  headache, or mood change.    Objective:   No results found. Recent Labs    11/29/23 0549  WBC 9.3  HGB 10.7*  HCT 31.1*  PLT 321   Recent Labs    11/29/23 0549  NA 139  K 3.6  CL 103  CO2 28  GLUCOSE 128*  BUN 14  CREATININE 0.73  CALCIUM  9.1    Intake/Output Summary (Last 24 hours) at 11/30/2023 0858 Last data filed at 11/30/2023 0755 Gross per 24 hour  Intake 960 ml  Output 1100 ml  Net -140 ml        Physical Exam: Vital Signs Blood pressure (!) 109/57, pulse 83, temperature 99.3 F (37.4 C), temperature source Oral, resp. rate 18, height 5\' 9"  (1.753 m), weight 91.3 kg, SpO2 96%.  Constitutional: No distress . Vital signs reviewed. HEENT: NCAT, EOMI, oral membranes moist Neck: supple Cardiovascular: RRR without murmur. No JVD    Respiratory/Chest: CTA Bilaterally without wheezes or rales. Normal effort    GI/Abdomen: BS +, non-tender, non-distended Ext: no clubbing, cyanosis, or edema Psych: pleasant and cooperative  Skin: left BK incision intact with sl central area with dry gauze and dried blood. .Erythema and warmth around wound and proximally across knee is present but decreased.  Neuro:  Alert and oriented x 3. Normal insight and awareness. Intact Memory. Normal language and speech. Cranial nerve exam unremarkable.  MMT: 5/5 in upper extremities RLE 4/5 LLE 3/5 HF and KE Sensory exam normal for light touch and pain in all 4 limbs. No limb ataxia or cerebellar signs. No abnormal tone appreciated.  Good siting balance Musculoskeletal: left BK area less tender. Swelling decreased with  shrinker   Assessment/Plan: 1. Functional deficits which require 3+ hours per day of interdisciplinary therapy in a comprehensive inpatient rehab setting. Physiatrist is providing close team supervision and 24 hour management of active medical problems listed below. Physiatrist and rehab team continue to assess barriers to discharge/monitor patient progress toward functional and medical goals  Care Tool:  Bathing    Body parts bathed by patient: Right arm, Left arm, Chest, Abdomen, Front perineal area, Buttocks, Right upper leg, Left upper leg, Right lower leg, Face     Body parts n/a: Left lower leg   Bathing assist Assist Level: Supervision/Verbal cueing     Upper Body Dressing/Undressing Upper body dressing   What is the patient wearing?: Pull over shirt    Upper body assist Assist Level: Independent with assistive device    Lower Body Dressing/Undressing Lower body dressing      What is the patient wearing?: Pants, Underwear/pull up     Lower body assist Assist for lower body dressing: Supervision/Verbal cueing     Toileting Toileting Toileting Activity did not occur (Clothing management and hygiene only): N/A (no void or bm)  Toileting assist Assist for toileting: Minimal Assistance - Patient > 75%  Transfers Chair/bed transfer  Transfers assist     Chair/bed transfer assist level: Contact Guard/Touching assist     Locomotion Ambulation   Ambulation assist      Assist level: Contact Guard/Touching assist Assistive device: Walker-rolling Max distance: 20   Walk 10 feet activity   Assist     Assist level: Contact Guard/Touching assist Assistive device: Walker-rolling   Walk 50 feet activity   Assist Walk 50 feet with 2 turns activity did not occur: Safety/medical concerns (fatigue)         Walk 150 feet activity   Assist Walk 150 feet activity did not occur: Safety/medical concerns (fatigue)         Walk 10 feet on uneven  surface  activity   Assist Walk 10 feet on uneven surfaces activity did not occur: Safety/medical concerns         Wheelchair     Assist Is the patient using a wheelchair?: Yes Type of Wheelchair: Manual    Wheelchair assist level: Supervision/Verbal cueing Max wheelchair distance: 150+    Wheelchair 50 feet with 2 turns activity    Assist        Assist Level: Supervision/Verbal cueing   Wheelchair 150 feet activity     Assist      Assist Level: Supervision/Verbal cueing   Medical Problem List and Plan: 1. 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  -we discussed timeline for prosthesis today  -grounds pass ordered  2.  Antithrombotics: -DVT/anticoagulation:  Pharmaceutical: Heparin  5000U q8h             -antiplatelet therapy: ASA 81mg  daily 3. Pain Management: robaxin PRN. Oxy-APAP 5-325mg  1-2 tabs q4h PRN -only using prn robaxin rarely -tylenol  prn 4. Mood/Behavior/Sleep/hx of PTSD and depression/anxiety: LCSW to follow for evaluation and support.              -antipsychotic agents: N/A             -Continue Bupropion  150mg  XR daily, Duloxetine  30mg  daily. Also has service dog.              -Melatonin 5mg  PRN--using with good results 5. Neuropsych/cognition: This patient is capable of making decisions on his own behalf. 6. Skin/Wound Care: 6/5  can keep small piece of gauze on incision for now as the dried s/s drainage within it has adhered to incision. The rest of area looks better   -maintain shrinker for compression and protection 7. Fluids/Electrolytes/Nutrition:  . Continue supplements and vitamins.   -albumin  is low.  -encouraged PO, protein supps 8. HTN:BP reviewed and soft, discussed with patient,  continue to hold home lisinopril   9. Cellulitis?: Residual limb edematous and tight with erythema extendin medially to mid thigh. Keflex initiated by PA for wound prophylaxis.              -will continue for now as pt tolerating med without problems   -wbc's normal at 7  Continue keflex for 5 days total and then reassess  10. CAD s/p PCI: Monitor for symptoms with increase in activity  11. H/o. TIAs/CVA  2017 w/residual mild L-HP: On ASA 12. Constipation: Senna added  13.HLD: continue Atorvastatin  80mg  daily 14. BPH: continue Flomax  0.4mg  daily, discussed that he has been on this for a long time  -voiding without issues 15. ABLA: pt only with mild drainage from incision  -6/5 hgb 10.7 today 16. Hyponatremia: improved-->139 6/5 17. GERD/GI ppx: Managed with Protonix  40mg  daily    LOS: 4 days A FACE TO FACE EVALUATION WAS PERFORMED  [name] [name] 11/30/2023, 8:58 AM    pr

## 2023-11-30 NOTE — Progress Notes (Signed)
 Occupational Therapy Session Note  Patient Details  Name: [name] [name] MRN: [id number] Date of Birth: 03-02-1946  Today's Date: 11/30/2023 OT Individual Time: 0836-1000 OT Individual Time Calculation (min): 84 min    Short Term Goals: Week 1:  OT Short Term Goal 1 (Week 1): STG = LTG at MOD I 2/2 ELOS  Skilled Therapeutic Interventions/Progress Updates:     Pt received sitting up in bed, dressed for the day upon OT arrival. Pt presenting to be in good spirits receptive to skilled OT session reporting 0/10 pain- OT offering intermittent rest breaks, repositioning, and therapeutic support to optimize participation in therapy session. Pt requesting to go outside during therapy session today to support improved moral. Focused this session on ADL retraining, activity tolerance, functional mobility at wc level, and HEP eduction.   Pt transitioned to EOB SUP. Partial stand pivot EOB > wc no AD SUP.   Pt positioned wc at sink and completed grooming/hygiene tasks seated in wc for energy conservation- Pt complete oral hygiene, shaved, groomed hair, and washed face MOD I.   Pt propelled wc from room <> outdoor location completing >300 ft mod I with seated rest break provided following to work on activity tolerance for ADLs. While outdoors, engaged Pt in completing wc mobility across uneven terrain and across thresholds to improve independence and confidence in outdoor wc mobility. Pt demonstrating appropriate wc manipulation and mobility skills with min verbal cues required for awareness during negotiation of obstacles.   Issued Pt handout with written instructions and pictures provided for B UE HEP to help improve and maintain strength for participation in ADLs, IADL, and functional mobility. Engaged Pt in completing HEP to optimize learning and carryover at home. Pt demonstrating teach back as evidence of learning.  Exercises - Seated Shoulder Horizontal Abduction with Resistance  - 1 x daily - 7 x  weekly - 2 sets - 10 reps - Seated Elbow Extension with Self-Anchored Resistance  - 1 x daily - 7 x weekly - 2 sets - 10 reps - Seated Elbow Flexion with Self-Anchored Resistance  - 1 x daily - 7 x weekly - 2 sets - 10 reps - Seated Shoulder Flexion with Self-Anchored Resistance  - 1 x daily - 7 x weekly - 2 sets - 10 reps - Seated Bilateral Shoulder External Rotation with Resistance  - 1 x daily - 7 x weekly - 2 sets - 10 reps - Seated Shoulder Diagonal Pulls with Resistance  - 1 x daily - 7 x weekly - 2 sets - 10 reps - Seated Scapular Protraction with Resistance  - 1 x daily - 7 x weekly - 2 sets - 10 reps Pt able to take rest breaks PRN during exercises. During rest breaks, provided education energy conservation techniques and fall prevention to increase safety at d/c with pt receptive to education. Also discussed d/t plans and home accessibility- Pt excite to share that his bathroom is almost finished being remodeled with pictures provided of his new roll-in shower!! Pt reports he is feeling more prepared for his upcoming d/c next week.   Handed Pt off to PT at end of session with all immediate needs met.   Therapy Documentation Precautions:  Precautions Precautions: Fall Recall of Precautions/Restrictions: Intact Other Brace: L limb protector Restrictions Weight Bearing Restrictions Per Provider Order: Yes LLE Weight Bearing Per Provider Order: Non weight bearing      Therapy/Group: Individual Therapy  [name] 11/30/2023, 9:24 AM

## 2023-12-01 DIAGNOSIS — I10 Essential (primary) hypertension: Secondary | ICD-10-CM | POA: Diagnosis not present

## 2023-12-01 DIAGNOSIS — Z89512 Acquired absence of left leg below knee: Secondary | ICD-10-CM | POA: Diagnosis not present

## 2023-12-01 NOTE — Progress Notes (Signed)
 PROGRESS NOTE   Subjective/Complaints:  No new issues , po sig pain , getting ready to shower   ROS: Patient denies breathing problems, bowel or bladder issues , N/V/D   Objective:   No results found. Recent Labs    11/29/23 0549  WBC 9.3  HGB 10.7*  HCT 31.1*  PLT 321   Recent Labs    11/29/23 0549  NA 139  K 3.6  CL 103  CO2 28  GLUCOSE 128*  BUN 14  CREATININE 0.73  CALCIUM  9.1    Intake/Output Summary (Last 24 hours) at 12/01/2023 1200 Last data filed at 12/01/2023 1610 Gross per 24 hour  Intake 948 ml  Output 1100 ml  Net -152 ml        Physical Exam: Vital Signs Blood pressure 123/69, pulse 78, temperature 98.9 F (37.2 C), temperature source Oral, resp. rate 18, height 5\' 9"  (1.753 m), weight 91.3 kg, SpO2 98%.  Constitutional: No distress . Vital signs reviewed.  General: No acute distress Mood and affect are appropriate Heart: Regular rate and rhythm no rubs murmurs or extra sounds Lungs: Clear to auscultation, breathing unlabored, no rales or wheezes Abdomen: Positive bowel sounds, soft nontender to palpation, nondistended Extremities: No clubbing, cyanosis, or edema Skin: No evidence of breakdown, no evidence of rash    MMT: 5/5 in upper extremities RLE 4/5 LLE 3/5 HF and KE Sensory exam normal for light touch and pain in all 4 limbs. No limb ataxia or cerebellar signs. No abnormal tone appreciated.  Good siting balance Musculoskeletal: left BK area less tender. Swelling decreased with shrinker   Assessment/Plan: 1. Functional deficits which require 3+ hours per day of interdisciplinary therapy in a comprehensive inpatient rehab setting. Physiatrist is providing close team supervision and 24 hour management of active medical problems listed below. Physiatrist and rehab team continue to assess barriers to discharge/monitor patient progress toward functional and medical goals  Care  Tool:  Bathing    Body parts bathed by patient: Right arm, Left arm, Chest, Abdomen, Front perineal area, Buttocks, Right upper leg, Left upper leg, Right lower leg, Face     Body parts n/a: Left lower leg   Bathing assist Assist Level: Supervision/Verbal cueing     Upper Body Dressing/Undressing Upper body dressing   What is the patient wearing?: Pull over shirt    Upper body assist Assist Level: Independent with assistive device    Lower Body Dressing/Undressing Lower body dressing      What is the patient wearing?: Pants, Underwear/pull up     Lower body assist Assist for lower body dressing: Supervision/Verbal cueing     Toileting Toileting Toileting Activity did not occur (Clothing management and hygiene only): N/A (no void or bm)  Toileting assist Assist for toileting: Minimal Assistance - Patient > 75%     Transfers Chair/bed transfer  Transfers assist     Chair/bed transfer assist level: Contact Guard/Touching assist     Locomotion Ambulation   Ambulation assist      Assist level: Contact Guard/Touching assist Assistive device: Walker-rolling Max distance: 20   Walk 10 feet activity   Assist     Assist level: Contact  Guard/Touching assist Assistive device: Walker-rolling   Walk 50 feet activity   Assist Walk 50 feet with 2 turns activity did not occur: Safety/medical concerns (fatigue)         Walk 150 feet activity   Assist Walk 150 feet activity did not occur: Safety/medical concerns (fatigue)         Walk 10 feet on uneven surface  activity   Assist Walk 10 feet on uneven surfaces activity did not occur: Safety/medical concerns         Wheelchair     Assist Is the patient using a wheelchair?: Yes Type of Wheelchair: Manual    Wheelchair assist level: Supervision/Verbal cueing Max wheelchair distance: 150+    Wheelchair 50 feet with 2 turns activity    Assist        Assist Level:  Supervision/Verbal cueing   Wheelchair 150 feet activity     Assist      Assist Level: Supervision/Verbal cueing   Medical Problem List and Plan: 1. 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  -we discussed timeline for prosthesis today  -grounds pass ordered  2.  Antithrombotics: -DVT/anticoagulation:  Pharmaceutical: Heparin  5000U q8h             -antiplatelet therapy: ASA 81mg  daily 3. Pain Management: robaxin  PRN. Oxy-APAP 5-325mg  1-2 tabs q4h PRN -only using prn robaxin  rarely -tylenol  prn 4. Mood/Behavior/Sleep/hx of PTSD and depression/anxiety: LCSW to follow for evaluation and support.              -antipsychotic agents: N/A             -Continue Bupropion  150mg  XR daily, Duloxetine  30mg  daily. Also has service dog.              -Melatonin 5mg  PRN--using with good results 5. Neuropsych/cognition: This patient is capable of making decisions on his own behalf. 6. Skin/Wound Care: 6/5  can keep small piece of gauze on incision for now as the dried s/s drainage within it has adhered to incision. The rest of area looks better   -maintain shrinker for compression and protection 7. Fluids/Electrolytes/Nutrition:  . Continue supplements and vitamins.   -albumin  is low.  -encouraged PO, protein supps 8. HTN:BP reviewed and soft, discussed with patient, continue to hold home lisinopril   9. Cellulitis?: Residual limb edematous and tight with erythema extendin medially to mid thigh. Keflex  initiated by PA for wound prophylaxis.              -will continue for now as pt tolerating med without problems   -wbc's normal at 7  Continue keflex  for 5 days total and then reassess  10. CAD s/p PCI: Monitor for symptoms with increase in activity  11.  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 Constipation: Senna added  13.HLD: continue Atorvastatin  80mg  daily 14. BPH: continue Flomax  0.4mg  daily, discussed that he has been on this for a long time  -voiding without issues 15. ABLA: pt only with mild drainage from incision      Latest Ref Rng & Units 11/29/2023  5:49 AM 11/27/2023    5:13 AM 11/23/2023    3:14 AM  CBC  WBC 4.0 - 10.5 K/uL 9.3  7.0  9.4   Hemoglobin 13.0 - 17.0 g/dL 16.1  09.6  04.5   Hematocrit 39.0 - 52.0 % 31.1  30.9  30.8   Platelets 150 - 400 K/uL 321  255  157     16. Hyponatremia: improved-->139 6/5 17. GERD/GI ppx: Managed with Protonix  40mg  daily    LOS: 5 days A FACE TO FACE EVALUATION WAS PERFORMED  [name] [name] 12/01/2023, 12:00 PM    pr

## 2023-12-01 NOTE — Progress Notes (Signed)
 Erythema observed to left BKA. Shrinker in place. PRN percocet 1 given at 2100 for incisional pain, rating pain a 7. Continent B&B. LBM 06/05.[name] [name] A

## 2023-12-01 NOTE — Plan of Care (Signed)
  Problem: Consults Goal: RH LIMB LOSS PATIENT EDUCATION Description: Description: See Patient Education module for eduction specifics. Outcome: Progressing   Problem: RH BOWEL ELIMINATION Goal: RH STG MANAGE BOWEL WITH ASSISTANCE Description: STG Manage Bowel with toileting Assistance. Outcome: Progressing Goal: RH STG MANAGE BOWEL W/MEDICATION W/ASSISTANCE Description: STG Manage Bowel with Medication with mod I Assistance. Outcome: Progressing   Problem: RH SAFETY Goal: RH STG ADHERE TO SAFETY PRECAUTIONS W/ASSISTANCE/DEVICE Description: STG Adhere to Safety Precautions With cues Assistance/Device. Outcome: Progressing   Problem: RH PAIN MANAGEMENT Goal: RH STG PAIN MANAGED AT OR BELOW PT'S PAIN GOAL Description: < 4 with prns Outcome: Progressing   Problem: RH KNOWLEDGE DEFICIT LIMB LOSS Goal: RH STG INCREASE KNOWLEDGE OF SELF CARE AFTER LIMB LOSS Description: Patient and wife will be able to manage care at discharge using educational resources for medications, skin care and dietary modification independently Outcome: Progressing

## 2023-12-02 MED ORDER — DOCUSATE SODIUM 100 MG PO CAPS: 100.0000 mg | ORAL_CAPSULE | Freq: Every day | ORAL | Status: AC

## 2023-12-02 NOTE — Plan of Care (Signed)
  Problem: Consults Goal: RH LIMB LOSS PATIENT EDUCATION Description: Description: See Patient Education module for eduction specifics. Outcome: Progressing   Problem: RH BOWEL ELIMINATION Goal: RH STG MANAGE BOWEL WITH ASSISTANCE Description: STG Manage Bowel with toileting Assistance. Outcome: Progressing Goal: RH STG MANAGE BOWEL W/MEDICATION W/ASSISTANCE Description: STG Manage Bowel with Medication with mod I Assistance. Outcome: Progressing   Problem: RH SAFETY Goal: RH STG ADHERE TO SAFETY PRECAUTIONS W/ASSISTANCE/DEVICE Description: STG Adhere to Safety Precautions With cues Assistance/Device. Outcome: Progressing   Problem: RH PAIN MANAGEMENT Goal: RH STG PAIN MANAGED AT OR BELOW PT'S PAIN GOAL Description: < 4 with prns Outcome: Progressing   Problem: RH KNOWLEDGE DEFICIT LIMB LOSS Goal: RH STG INCREASE KNOWLEDGE OF SELF CARE AFTER LIMB LOSS Description: Patient and wife will be able to manage care at discharge using educational resources for medications, skin care and dietary modification independently Outcome: Progressing

## 2023-12-02 NOTE — Progress Notes (Signed)
 Physical Therapy Session Note  Patient Details  Name: [name] [name] MRN: [id number] Date of Birth: 06/26/1946  Today's Date: 12/02/2023 PT Individual Time: 1000-1100, 7124-5809 PT Individual Time Calculation (min): 60 min, 73 min   Short Term Goals: Week 1:  PT Short Term Goal 1 (Week 1): STG=LTG 2/2 ELOS  Skilled Therapeutic Interventions/Progress Updates:      Treatment Session 1  Pt seated in WC upon arrival. Pt agreeable to therapy. Pt denies any pain.   Pt wife present for family training.   Education provided regarding pt impairments post L BKA.   Confirmed pt has WC, RW, BSC, and shower chair at home. Pt and pt wife report the ramp will be completed 6/9. Recommended pt also purchase reacher and skin inspection mirror.   Education provided for methods to reduce fall risk, recognizing signs and symptoms of pressure injury/infection, importance and purpose of of performing skin inspection daily, wearing tennis shoe R LE, wearing L LE limb protector, and L LE shrinker, and minimizing unnecessary forces on R LE. Recommended pt utilize [facility] predominately with short distance ambulation to bathroom and other areas inaccessible with WC. Pt and pt provided teach back of education.   Pt performed stand pivot transfer WC to car simualtor with RW and supervision. Pt and pt wife returned demonstration. Education provided for how to fold RW and WC for transport.   Pt performed stand pivot transfer with use of arm rest of WC to rehab apartment with supervision progressing to mod I. Verbal cues provided initially for head hip ratio and removal of arm rest.   Pt ambulated with RW and supervision throughout room and performed ambulatory transfer to recliner.   Doffed and cleaned shrinker, nurse present. Noticed clear drainage. Doffed ace wrap 2/2 both shirnkers wet and hanging to dry. Contacted hanger representative and requested 3XL shrinkers.   Pt seated in recliner at end of session with all  needs within reach and bed alarm on.   Treatment Session 2   Pt supine in bed upon arrival. Pt agreeable to therapy. Pt denies any pain.   Reviewed and performed the following HEP, handout provided:  Access Code: [email or web address] URL: [email or web address] Date: 12/02/2023 Prepared by: Jenney Modest  Exercises - Wheelchair Push-Up (AKA)  - 1 x daily - 7 x weekly - 3 sets - 10 reps - Prone Hip Extension with Residual Limb (BKA)  - 1 x daily - 7 x weekly - 3 sets - 10 reps - Sidelying Hip Abduction (AKA)  - 1 x daily - 7 x weekly - 3 sets - 10 reps - Supine Single Leg Bridge with Sound Leg (AKA)  - 1 x daily - 7 x weekly - 3 sets - 10 reps  Pt propelled WC throughout main gym, and in gift shop and utilized reacher to grab items at various heights from [facility] level. Pt demos good carry over of positioning WC to side of item.   Pt navigated inclined sidewalk with mod I with B UE/R LE. Pt performed stand pivot transfer WC to outdoor bench with supervision progressing to mod I.   Pt made mod I in room for transfers only, pt requires supervision with ambulation with RW for safety. Therapist set up room to allow pt to perform all transfers throughout room mod I.   Pt supine in bed with all needs within reach and bed alarm on.   Therapy Documentation Precautions:  Precautions Precautions: Fall Recall of Precautions/Restrictions: Intact Other Brace: L limb protector  Restrictions Weight Bearing Restrictions Per Provider Order: Yes LLE Weight Bearing Per Provider Order: Non weight bearing  Therapy/Group: Individual Therapy  [facility], [place], DPT  12/02/2023, 7:55 AM

## 2023-12-02 NOTE — Progress Notes (Signed)
 Occupational Therapy Session Note  Patient Details  Name: [name] [name] MRN: [id number] Date of Birth: Nov 14, 1945  Today's Date: 12/02/2023 OT Individual Time: 1100-1200 OT Individual Time Calculation (min): 60 min    Short Term Goals: Week 1:  OT Short Term Goal 1 (Week 1): STG = LTG at MOD I 2/2 ELOS Week 2:     Skilled Therapeutic Interventions/Progress Updates:    1:1 Pt received in the recliner with wife present for fam education. Pt reported showering last night with distant supervision from nursing. 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 Wife and patient in agreement.   Therapy Documentation Precautions:  Precautions Precautions: Fall Recall of Precautions/Restrictions: Intact Other Brace: L limb protector Restrictions Weight Bearing Restrictions Per Provider Order: Yes LLE Weight Bearing Per Provider Order: Non weight bearing  Pain:  No report of pain   Therapy/Group: Individual Therapy  [place], [facility] 12/02/2023, 12:44 PM

## 2023-12-02 NOTE — Progress Notes (Signed)
 Slept good. PRN melatonin given at 2128. Erythema to incision line unchanged. New rash to right groin, mircroguard powder applied. Wore right SCD for short period of time. Denies pain. BM 06/07. Dedrick Fanti, RN

## 2023-12-02 NOTE — Progress Notes (Signed)
 Pt suture bleeding through ace bandage, Nurse notified  I agree with this student's documentation.

## 2023-12-02 NOTE — Progress Notes (Signed)
 PROGRESS NOTE   Subjective/Complaints:  Attempted to see patient however he was outside the building with therapy.  No problems noted per nursing staff.  Reviewed labs and vitals and medications.  ROS: Patient denies breathing problems, bowel or bladder issues , N/V/D   Objective:   No results found. No results for input(s): "WBC", "HGB", "HCT", "PLT" in the last 72 hours.  No results for input(s): "NA", "K", "CL", "CO2", "GLUCOSE", "BUN", "CREATININE", "CALCIUM " in the last 72 hours.   Intake/Output Summary (Last 24 hours) at 12/02/2023 1212 Last data filed at 12/02/2023 1610 Gross per 24 hour  Intake 240 ml  Output 1200 ml  Net -960 ml        Physical Exam: Vital Signs Blood pressure 128/71, pulse 75, temperature 98.7 F (37.1 C), temperature source Oral, resp. rate 18, height 5\' 9"  (1.753 m), weight 91.3 kg, SpO2 98%.      Assessment/Plan: 1. Functional deficits which require 3+ hours per day of interdisciplinary therapy in a comprehensive inpatient rehab setting. Physiatrist is providing close team supervision and 24 hour management of active medical problems listed below. Physiatrist and rehab team continue to assess barriers to discharge/monitor patient progress toward functional and medical goals  Care Tool:  Bathing    Body parts bathed by patient: Right arm, Left arm, Chest, Abdomen, Front perineal area, Buttocks, Right upper leg, Left upper leg, Right lower leg, Face     Body parts n/a: Left lower leg   Bathing assist Assist Level: Supervision/Verbal cueing     Upper Body Dressing/Undressing Upper body dressing   What is the patient wearing?: Pull over shirt    Upper body assist Assist Level: Independent with assistive device    Lower Body Dressing/Undressing Lower body dressing      What is the patient wearing?: Pants, Underwear/pull up     Lower body assist Assist for lower body  dressing: Supervision/Verbal cueing     Toileting Toileting Toileting Activity did not occur (Clothing management and hygiene only): N/A (no void or bm)  Toileting assist Assist for toileting: Minimal Assistance - Patient > 75%     Transfers Chair/bed transfer  Transfers assist     Chair/bed transfer assist level: Contact Guard/Touching assist     Locomotion Ambulation   Ambulation assist      Assist level: Contact Guard/Touching assist Assistive device: Walker-rolling Max distance: 20   Walk 10 feet activity   Assist     Assist level: Contact Guard/Touching assist Assistive device: Walker-rolling   Walk 50 feet activity   Assist Walk 50 feet with 2 turns activity did not occur: Safety/medical concerns (fatigue)         Walk 150 feet activity   Assist Walk 150 feet activity did not occur: Safety/medical concerns (fatigue)         Walk 10 feet on uneven surface  activity   Assist Walk 10 feet on uneven surfaces activity did not occur: Safety/medical concerns         Wheelchair     Assist Is the patient using a wheelchair?: Yes Type of Wheelchair: Manual    Wheelchair assist level: Supervision/Verbal cueing Max wheelchair distance: 150+  Wheelchair 50 feet with 2 turns activity    Assist        Assist Level: Supervision/Verbal cueing   Wheelchair 150 feet activity     Assist      Assist Level: Supervision/Verbal cueing   Medical Problem List and Plan: 1. 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  -we discussed timeline for prosthesis today  -grounds pass ordered  2.  Antithrombotics: -DVT/anticoagulation:  Pharmaceutical: Heparin  5000U q8h             -antiplatelet  therapy: ASA 81mg  daily 3. Pain Management: robaxin  PRN. Oxy-APAP 5-325mg  1-2 tabs q4h PRN -only using prn robaxin  rarely -tylenol  prn 4. Mood/Behavior/Sleep/hx of PTSD and depression/anxiety: LCSW to follow for evaluation and support.              -antipsychotic agents: N/A             -Continue Bupropion  150mg  XR daily, Duloxetine  30mg  daily. Also has service dog.              -Melatonin 5mg  PRN--using with good results 5. Neuropsych/cognition: This patient is capable of making decisions on his own behalf. 6. Skin/Wound Care: 6/5  can keep small piece of gauze on incision for now as the dried s/s drainage within it has adhered to incision. The rest of area looks better   -maintain shrinker for compression and protection 7. Fluids/Electrolytes/Nutrition:  . Continue supplements and vitamins.   -albumin  is low.  -encouraged PO, protein supps 8. HTN:BP reviewed and soft, discussed with patient, continue to hold home lisinopril   9. Cellulitis?: Residual limb edematous and tight with erythema extendin medially to mid thigh. Keflex  initiated by PA for wound prophylaxis.              -will continue for now as pt tolerating med without problems   -wbc's normal at 7  Continue keflex  for 5 days total and then reassess  10. CAD s/p PCI: Monitor for symptoms with increase in activity  11. 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 Constipation: Senna added  13.HLD: continue Atorvastatin  80mg  daily 14. BPH: continue Flomax  0.4mg  daily, discussed that he has been on this for a long time  -voiding without issues 15. ABLA: pt only with mild drainage from incision      Latest Ref Rng & Units 11/29/2023    5:49 AM 11/27/2023    5:13 AM 11/23/2023    3:14 AM  CBC  WBC 4.0 - 10.5 K/uL 9.3  7.0  9.4   Hemoglobin 13.0 - 17.0 g/dL 57.8  46.9  62.9   Hematocrit 39.0 - 52.0 % 31.1  30.9  30.8   Platelets 150 - 400 K/uL 321  255  157     16. Hyponatremia: improved-->139 6/5 17. GERD/GI ppx:  Managed with Protonix  40mg  daily    LOS: 6 days   [name] [name] 12/02/2023, 12:12 PM    pr

## 2023-12-02 NOTE — Progress Notes (Cosign Needed)
 Pt suture bleeding through ace bandage, Nurse notified

## 2023-12-02 NOTE — Discharge Instructions (Signed)
 Inpatient Rehab Discharge Instructions  [name] [name] Discharge date and time:  12/03/23  Activities/Precautions/ Functional Status: Activity: no lifting, driving, or strenuous exercise till cleared by MD Diet: cardiac diet Wound Care:  Wash with soap and water, pat dry. Keep wound clean and dry. Wear shrinker daily.    Functional status:  ___ No restrictions     ___ Walk up steps independently ___ 24/7 supervision/assistance   ___ Walk up steps with assistance _X__ Intermittent supervision/assistance  ___ Bathe/dress independently ___ Walk with walker     ___ Bathe/dress with assistance ___ Walk Independently    ___ Shower independently ___ Walk with assistance    _X__ Shower with assistance _X__ No alcohol      ___ Return to work/school ________   Special Instructions:   COMMUNITY REFERRALS UPON DISCHARGE:    Home Health:   PT  &   OT                 Agency:[facility]  Phone:[phone]    Medical Equipment/Items Ordered:HAS ALL NEEDED EQUIPMENT                                                 Agency/Supplier:NA    My questions have been answered and I understand these instructions. I will adhere to these goals and the provided educational materials after my discharge from the hospital.  Patient/Caregiver Signature _______________________________ Date __________  Clinician Signature _______________________________________ Date __________  Please bring this form and your medication list with you to all your follow-up doctor's appointments.

## 2023-12-03 ENCOUNTER — Ambulatory Visit: Admitting: Surgery

## 2023-12-03 DIAGNOSIS — E871 Hypo-osmolality and hyponatremia: Secondary | ICD-10-CM | POA: Diagnosis not present

## 2023-12-03 DIAGNOSIS — D62 Acute posthemorrhagic anemia: Secondary | ICD-10-CM | POA: Diagnosis not present

## 2023-12-03 DIAGNOSIS — Z89512 Acquired absence of left leg below knee: Secondary | ICD-10-CM | POA: Diagnosis not present

## 2023-12-03 DIAGNOSIS — I10 Essential (primary) hypertension: Secondary | ICD-10-CM | POA: Diagnosis not present

## 2023-12-03 DIAGNOSIS — K5900 Constipation, unspecified: Secondary | ICD-10-CM

## 2023-12-03 LAB — BASIC METABOLIC PANEL WITH GFR
Anion gap: 7 (ref 5–15)
BUN: 11 mg/dL (ref 8–23)
CO2: 30 mmol/L (ref 22–32)
Calcium: 9 mg/dL (ref 8.9–10.3)
Chloride: 103 mmol/L (ref 98–111)
Creatinine, Ser: 0.73 mg/dL (ref 0.61–1.24)
GFR, Estimated: >60 mL/min (ref >60)
Glucose, Bld: 115 mg/dL — ABNORMAL HIGH (ref 70–99)
Potassium: 4.2 mmol/L (ref 3.5–5.1)
Sodium: 140 mmol/L (ref 135–145)

## 2023-12-03 LAB — CBC WITH DIFFERENTIAL/PLATELET
Abs Immature Granulocytes: 0.07 10*3/uL (ref 0.00–0.07)
Basophils Absolute: 0 10*3/uL (ref 0.0–0.1)
Basophils Relative: 1 %
Eosinophils Absolute: 0.2 10*3/uL (ref 0.0–0.5)
Eosinophils Relative: 3 %
HCT: 31.6 % — ABNORMAL LOW (ref 39.0–52.0)
Hemoglobin: 10.8 g/dL — ABNORMAL LOW (ref 13.0–17.0)
Immature Granulocytes: 1 %
Lymphocytes Relative: 27 %
Lymphs Abs: 2.3 10*3/uL (ref 0.7–4.0)
MCH: 30.6 pg (ref 26.0–34.0)
MCHC: 34.2 g/dL (ref 30.0–36.0)
MCV: 89.5 fL (ref 80.0–100.0)
Monocytes Absolute: 0.8 10*3/uL (ref 0.1–1.0)
Monocytes Relative: 9 %
Neutro Abs: 5.3 10*3/uL (ref 1.7–7.7)
Neutrophils Relative %: 59 %
Platelets: 373 10*3/uL (ref 150–400)
RBC: 3.53 MIL/uL — ABNORMAL LOW (ref 4.22–5.81)
RDW: 11.7 % (ref 11.5–15.5)
WBC: 8.7 10*3/uL (ref 4.0–10.5)
nRBC: 0 % (ref 0.0–0.2)

## 2023-12-03 MED ORDER — OXYCODONE-ACETAMINOPHEN 5-325 MG PO TABS: 1.0000 | ORAL_TABLET | Freq: Three times a day (TID) | ORAL | 0 refills | Status: DC | PRN

## 2023-12-03 NOTE — Progress Notes (Signed)
 Prn percocet given at 2138 for complaint of left BKA pain. Incision line with erythema, unchanged. Curlee Bogan A

## 2023-12-03 NOTE — Plan of Care (Signed)
  Problem: RH Balance Goal: LTG Patient will maintain dynamic sitting balance (PT) Description: LTG:  Patient will maintain dynamic sitting balance with assistance during mobility activities (PT) Outcome: Completed/Met Goal: LTG Patient will maintain dynamic standing balance (PT) Description: LTG:  Patient will maintain dynamic standing balance with assistance during mobility activities (PT) Outcome: Completed/Met   Problem: Sit to Stand Goal: LTG:  Patient will perform sit to stand with assistance level (PT) Description: LTG:  Patient will perform sit to stand with assistance level (PT) Outcome: Completed/Met   Problem: RH Bed Mobility Goal: LTG Patient will perform bed mobility with assist (PT) Description: LTG: Patient will perform bed mobility with assistance, with/without cues (PT). Outcome: Completed/Met   Problem: RH Bed to Chair Transfers Goal: LTG Patient will perform bed/chair transfers w/assist (PT) Description: LTG: Patient will perform bed to chair transfers with assistance (PT). Outcome: Completed/Met   Problem: RH Car Transfers Goal: LTG Patient will perform car transfers with assist (PT) Description: LTG: Patient will perform car transfers with assistance (PT). Outcome: Completed/Met   Problem: RH Ambulation Goal: LTG Patient will ambulate in controlled environment (PT) Description: LTG: Patient will ambulate in a controlled environment, # of feet with assistance (PT). Outcome: Completed/Met Goal: LTG Patient will ambulate in home environment (PT) Description: LTG: Patient will ambulate in home environment, # of feet with assistance (PT). Outcome: Completed/Met   Problem: RH Wheelchair Mobility Goal: LTG Patient will propel w/c in home environment (PT) Description: LTG: Patient will propel wheelchair in home environment, # of feet with assistance (PT). Outcome: Completed/Met

## 2023-12-03 NOTE — Progress Notes (Signed)
 Physical Therapy Note  Patient Details  Name: [name] [name] MRN: [id number] Date of Birth: 12-15-1945 Today's Date: 12/03/2023    Pt discharged 12/03/23 prior to final PT session scheduled at 2:15.    Jenney Modest 12/03/2023, 3:19 PM

## 2023-12-03 NOTE — Progress Notes (Addendum)
 Inpatient Rehabilitation Care Coordinator Discharge Note   Patient Details  Name: [name] [name] MRN: [id number] Date of Birth: Dec 13, 1945   Discharge location: HOME WITH WIFE WHO CAN PROVIDE ASSIST  Length of Stay: 7 DAYS  Discharge activity level: MOD/I-SUPERVISION LEVEL  Home/community participation: ACTIVE  Patient response WU:JWJXBJ Literacy - How often do you need to have someone help you when you read instructions, pamphlets, or other written material from your doctor or pharmacy?: Never  Patient response YN:WGNFAO Isolation - How often do you feel lonely or isolated from those around you?: Never  Services provided included: MD, RD, PT, OT, RN, CM, Pharmacy, SW  Financial Services:  Financial Services Utilized: Dealer  Choices offered to/list presented to: PT AND WIFE  Follow-up services arranged:  Home Health, Patient/Family request agency HH/DME Home Health Agency: [facility]      HH/DME Requested Agency: REFERRAL MADE WHILE ON ACUTE NO DME NEEDS HAS ALL FROM PREVIOUS ADMISSIONS  Patient response to transportation need: Is the patient able to respond to transportation needs?: Yes In the past 12 months, has lack of transportation kept you from medical appointments or from getting medications?: No In the past 12 months, has lack of transportation kept you from meetings, work, or from getting things needed for daily living?: No   Patient/Family verbalized understanding of follow-up arrangements:  Yes  Individual responsible for coordination of the follow-up plan: SELF [phone]  Confirmed correct DME delivered: Mardell Shade 12/03/2023    Comments (or additional information):PT DID WELL AND REACHED HIS GOALS AND WIFE HAS IN FOR FAMILY EDUCATION  Summary of Stay    Date/Time Discharge Planning CSW  11/28/23 1002 Home with wife who is able to assist. [facility] assisting with [facility] and eventual bathroom remodel. Has all  needed equipment RGD       Talaya Lamprecht, Maralee Senate

## 2023-12-03 NOTE — Progress Notes (Signed)
 Occupational Therapy Session Note  Patient Details  Name: [name] [name] MRN: [id number] Date of Birth: Oct 26, 1945  Today's Date: 12/03/2023 OT Individual Time: 4098-1191 OT Individual Time Calculation (min): 62 min    Short Term Goals: Week 1:  OT Short Term Goal 1 (Week 1): STG = LTG at MOD I 2/2 ELOS  Skilled Therapeutic Interventions/Progress Updates:   Pt greeted seated in w/c, pt agreeable to OT intervention.      Transfers/bed mobility/functional mobility: pt completed w/c propulsion MODI. Pt completed sit>stands with RW MODI.   Therapeutic activity:  Pt completed Standing balance task using BITs to reach dynamically to screen with pt alernating reaching with BUEs. Pt completed task MODI.   IADLS: pt completed standing balance task with pt able to stand at cabinets to remove dishes from [facility] cabinet with unilateral support MODI.     Exercises: pt completed below BUE therex to facilitate improved global strength and endurance:  1 min of chest presses with 5 lb weighted ball  30 sec rest break  1 min of OH reaching  30 min rest break  1 min of diagonal reaches to R and L side                  Ended session with pt seated in w/c with all needs within reach and pt MODI in room.          Therapy Documentation Precautions:  Precautions Precautions: Fall Recall of Precautions/Restrictions: Intact Other Brace: L limb protector Restrictions Weight Bearing Restrictions Per Provider Order: Yes LLE Weight Bearing Per Provider Order: Non weight bearing  Pain: No pain    Therapy/Group: Individual Therapy  Willadean Hark 12/03/2023, 12:13 PM

## 2023-12-03 NOTE — Discharge Summary (Signed)
 Physician Discharge Summary  Patient ID: [name] [name] MRN: [id number] DOB/AGE: 1945/12/26 78 y.o.  Admit date: 11/26/2023 Discharge date: 12/03/2023  Discharge Diagnoses:  Principal Problem:   S/P BKA (below knee amputation), left (HCC) Active Problems:   Essential hypertension   Discharged Condition: {condition:18240}  Significant Diagnostic Studies: No results found.  Labs:  Basic Metabolic Panel: Recent Labs  Lab 11/26/23 1826 11/29/23 0549 12/03/23 0512  NA 136 139 140  K 3.8 3.6 4.2  CL 98 103 103  CO2 27 28 30   GLUCOSE 147* 128* 115*  BUN 12 14 11   CREATININE 0.70 0.73 0.73  CALCIUM  9.1 9.1 9.0    CBC: Recent Labs  Lab 11/27/23 0513 11/29/23 0549 12/03/23 0512  WBC 7.0 9.3 8.7  NEUTROABS 4.6 6.5 5.3  HGB 10.8* 10.7* 10.8*  HCT 30.9* 31.1* 31.6*  MCV 88.3 88.6 89.5  PLT 255 321 373    CBG: No results for input(s): "GLUCAP" in the last 168 hours.  Brief HPI:   [name] [name] is a 78 y.o. male ***   Hospital Course: [name] [name] was admitted to rehab 11/26/2023 for inpatient therapies to consist of PT, ST and OT at least three hours five days a week. Past admission physiatrist, therapy team and rehab RN have worked together to provide customized collaborative inpatient rehab.   Blood pressures were monitored on TID basis and   Diabetes has been monitored with ac/hs CBG checks and SSI was use prn for tighter BS control.    Rehab course: During patient's stay in rehab weekly team conferences were held to monitor patient's progress, set goals and discuss barriers to discharge. At admission, patient required  He/She  has had improvement in activity tolerance, balance, postural control as well as ability to compensate for deficits. He/She has had improvement in functional use RUE/LUE  and RLE/LLE as well as improvement in awareness       Disposition:  There are no questions and answers to display.         Diet:  Special  Instructions:  Discharge Instructions     Ambulatory referral to Physical Medicine Rehab   Complete by: As directed    Hospital follow up      Allergies as of 12/03/2023       Reactions   Dilaudid [hydromorphone Hcl] Other (See Comments)   "CRASHES" per patient Talked to nurse and she said pt is afraid to take dilaudid because pt states he flat-lined when they gave it to him.[name] AasAaron AasPt doesn't mind taking Percocet    Clindamycin/lincomycin Other (See Comments)   Blisters in mouth   Oxcarbazepine Other (See Comments)   Other reaction(s): Delirium        Medication List     STOP taking these medications    Cholecalciferol 50 MCG (2000 UT) Tabs   nitroGLYCERIN  0.2 mg/hr patch Commonly known as: NITRODUR - Dosed in mg/24 hr       TAKE these medications    aspirin  EC 81 MG tablet Take 81 mg by mouth daily.   atorvastatin  80 MG tablet Commonly known as: LIPITOR  Take 80 mg by mouth daily.   buPROPion  150 MG 24 hr tablet Commonly known as: WELLBUTRIN  XL Take 150 mg by mouth daily.   carboxymethylcellulose 0.5 % Soln Commonly known as: REFRESH PLUS Place 1 drop into both eyes 3 (three) times daily as needed (dry eyes).   cyanocobalamin  500 MCG tablet Commonly known as: VITAMIN B12 Take 500 mcg by mouth daily.  docusate sodium  100 MG capsule Commonly known as: COLACE Take 1 capsule (100 mg total) by mouth daily.   DULoxetine  30 MG capsule Commonly known as: CYMBALTA  Take 30 mg by mouth daily.   omeprazole 20 MG capsule Commonly known as: PRILOSEC Take 20 mg by mouth daily.   oxyCODONE -acetaminophen  5-325 MG tablet Commonly known as: PERCOCET/ROXICET Take 1-2 tablets by mouth every 8 (eight) hours as needed for moderate pain (pain score 4-6). Notes to patient: Use as needed for severe pain--limit to 2 pills per day AS NEEDED FOR SEVERE PAIN   tamsulosin  0.4 MG Caps capsule Commonly known as: FLOMAX  Take 0.4 mg by mouth daily.        Follow-up  Information     Carolanne Church, MD Follow up.   Specialty: Family Medicine Why: Call in 1-2 days for post hospital follow up Contact information: [address] [place] [address] [phone]         Margherita Shell, MD Follow up on 12/24/2023.   Specialties: Vascular Surgery, Cardiology Why: appointment at 10:30 am Contact information: [address] [place] [address] [phone]         Lylia Sand, MD Follow up.   Specialty: Physical Medicine and Rehabilitation Why: office will call you with follow up appointment Contact information: [address] [place] [address] [phone]                 Signed: Zelda Hickman 12/03/2023, 11:59 AM

## 2023-12-03 NOTE — Progress Notes (Signed)
 Physical Therapy Discharge Summary  Patient Details  Name: [name] [name] MRN: [id number] Date of Birth: 1945-08-03  Date of Discharge from PT service:December 03, 2023   Patient has met 9 of 9 long term goals due to improved activity tolerance, improved balance, increased strength, increased range of motion, decreased pain, ability to compensate for deficits, improved attention, improved awareness, and improved coordination.  Patient to discharge at a wheelchair level Independent, with short distance ambulation with use of RW and supervision. Patient's care partner is independent to provide the necessary physical assistance at discharge.   Recommendation:  Patient will benefit from ongoing skilled PT services in home health setting to continue to advance safe functional mobility, address ongoing impairments in gait, balance, endurance, and minimize fall risk.  Equipment: Pt has ramp, WC, RW, BSC, and shower chair  Reasons for discharge: treatment goals met and discharge from hospital  Patient/family agrees with progress made and goals achieved: Yes  PT Discharge Precautions/Restrictions Precautions Precautions: Fall Recall of Precautions/Restrictions: Intact Required Braces or Orthoses: Other Brace Other Brace: L limb protector, L shrinker Restrictions Weight Bearing Restrictions Per Provider Order: Yes LLE Weight Bearing Per Provider Order: Non weight bearing Pain Interference Pain Interference Pain Effect on Sleep: 1. Rarely or not at all Pain Interference with Therapy Activities: 1. Rarely or not at all Pain Interference with Day-to-Day Activities: 1. Rarely or not at all Vision/Perception  Vision - History Ability to See in Adequate Light: 0 Adequate Perception Perception: Within Functional Limits Praxis Praxis: WFL  Cognition Overall Cognitive Status: Within Functional Limits for tasks assessed Arousal/Alertness: Awake/alert Orientation Level: Oriented X4 Memory:  Appears intact Awareness: Appears intact Problem Solving: Appears intact Safety/Judgment: Appears intact Sensation Sensation Light Touch: Impaired by gross assessment Hot/Cold: Appears Intact Proprioception: Appears Intact Stereognosis: Not tested Additional Comments: pt reprots sensation is improving and decreased numbness and tingly noted Coordination Gross Motor Movements are Fluid and Coordinated: No Fine Motor Movements are Fluid and Coordinated: Yes Coordination and Movement Description: impaired 2/2 BKA Motor  Motor Motor: Other (comment) Motor - Skilled Clinical Observations: L BKA Motor - Discharge Observations: L BKA but Motor ability improved from eval  Mobility Bed Mobility Bed Mobility: Supine to Sit;Sit to Supine;Rolling Left;Rolling Right Rolling Right: Independent with assistive device Rolling Left: Independent with assistive device Supine to Sit: Independent with assistive device Sit to Supine: Independent with assistive device Transfers Transfers: Sit to Stand;Stand to Sit;Stand Pivot Transfers;Lateral/Scoot Transfers Sit to Stand: Independent with assistive device Stand to Sit: Independent with assistive device Stand Pivot Transfers: Independent with assistive device Lateral/Scoot Transfers: Independent with assistive device Transfer (Assistive device): Rolling walker Locomotion  Gait Ambulation: Yes Gait Assistance: Supervision/Verbal cueing Gait Distance (Feet): 30 Feet Assistive device: Rolling walker Gait Gait: Yes Gait Pattern: Impaired (hop to) Gait velocity: decr Stairs / Additional Locomotion Stairs: No Pick up small object from the floor assist level: Supervision/Verbal cueing Pick up small object from the floor assistive device: RW and Chief Operating Officer Mobility: Yes Wheelchair Assistance: Independent with assistive device Wheelchair Propulsion: Both upper extremities;Right lower extremity Wheelchair Parts  Management: Independent Distance: 150+  Trunk/Postural Assessment  Cervical Assessment Cervical Assessment: Within Functional Limits Thoracic Assessment Thoracic Assessment: Within Functional Limits Lumbar Assessment Lumbar Assessment: Within Functional Limits Postural Control Postural Control: Within Functional Limits  Balance Balance Balance Assessed: Yes Static Sitting Balance Static Sitting - Balance Support: Feet supported Static Sitting - Level of Assistance: 7: Independent Dynamic Sitting Balance Dynamic Sitting - Balance Support: During  functional activity Dynamic Sitting - Level of Assistance: 6: Modified independent (Device/Increase time) Static Standing Balance Static Standing - Balance Support: Bilateral upper extremity supported;During functional activity Static Standing - Level of Assistance: 6: Modified independent (Device/Increase time) Dynamic Standing Balance Dynamic Standing - Balance Support: During functional activity;Bilateral upper extremity supported Dynamic Standing - Level of Assistance: 6: Modified independent (Device/Increase time) Extremity Assessment  RLE Assessment RLE Assessment: Exceptions to [facility] General Strength Comments: grossly 3+/5 LLE Assessment LLE Assessment: Exceptions to [facility] General Strength Comments: grossly 3+/5   Abraham Hoffmann, PT, DPT  12/03/2023, 3:15 PM

## 2023-12-03 NOTE — Progress Notes (Signed)
 PROGRESS NOTE   Subjective/Complaints:  No new concerns this Am. Pt would like to go home after therapy today.   ROS: Patient denies denies fever, chills, CP, SOB, N/V/D/C   Objective:   No results found. Recent Labs    12/03/23 0512  WBC 8.7  HGB 10.8*  HCT 31.6*  PLT 373    Recent Labs    12/03/23 0512  NA 140  K 4.2  CL 103  CO2 30  GLUCOSE 115*  BUN 11  CREATININE 0.73  CALCIUM  9.0     Intake/Output Summary (Last 24 hours) at 12/03/2023 1113 Last data filed at 12/03/2023 0718 Gross per 24 hour  Intake 700 ml  Output 1100 ml  Net -400 ml        Physical Exam: Vital Signs Blood pressure (!) 104/51, pulse 69, temperature 97.9 F (36.6 C), resp. rate 18, height 5\' 9"  (1.753 m), weight 91.3 kg, SpO2 100%.   General: No acute distress Mood and affect are appropriate Heart: Regular rate and rhythm no rubs murmurs or extra sounds Lungs: Clear to auscultation, breathing unlabored, no rales or wheezes Abdomen: Positive bowel sounds, soft nontender to palpation, nondistended Extremities: No clubbing, cyanosis, or edema Skin: No evidence of breakdown, no evidence of rash     MMT: 5/5 in upper extremities RLE 4/5 LLE 3/5 HF and KE Sensory exam normal for light touch and pain in all 4 limbs. No limb ataxia or cerebellar signs. No abnormal tone appreciated.  Good siting balance Musculoskeletal: left BK area less tender. Swelling decreased with shrinker. Wearing new limb protector          Assessment/Plan: 1. Functional deficits which require 3+ hours per day of interdisciplinary therapy in a comprehensive inpatient rehab setting. Physiatrist is providing close team supervision and 24 hour management of active medical problems listed below. Physiatrist and rehab team continue to assess barriers to discharge/monitor patient progress toward functional and medical goals  Care Tool:  Bathing     Body parts bathed by patient: Right arm, Left arm, Chest, Abdomen, Front perineal area, Buttocks, Right upper leg, Left upper leg, Right lower leg, Face     Body parts n/a: Left lower leg   Bathing assist Assist Level: Independent with assistive device     Upper Body Dressing/Undressing Upper body dressing   What is the patient wearing?: Pull over shirt    Upper body assist Assist Level: Independent with assistive device    Lower Body Dressing/Undressing Lower body dressing      What is the patient wearing?: Pants, Underwear/pull up     Lower body assist Assist for lower body dressing: Independent with assitive device     Toileting Toileting Toileting Activity did not occur (Clothing management and hygiene only): N/A (no void or bm)  Toileting assist Assist for toileting: Independent with assistive device     Transfers Chair/bed transfer  Transfers assist     Chair/bed transfer assist level: Contact Guard/Touching assist     Locomotion Ambulation   Ambulation assist      Assist level: Contact Guard/Touching assist Assistive device: Walker-rolling Max distance: 20   Walk 10 feet activity   Assist  Assist level: Contact Guard/Touching assist Assistive device: Walker-rolling   Walk 50 feet activity   Assist Walk 50 feet with 2 turns activity did not occur: Safety/medical concerns (fatigue)         Walk 150 feet activity   Assist Walk 150 feet activity did not occur: Safety/medical concerns (fatigue)         Walk 10 feet on uneven surface  activity   Assist Walk 10 feet on uneven surfaces activity did not occur: Safety/medical concerns         Wheelchair     Assist Is the patient using a wheelchair?: Yes Type of Wheelchair: Manual    Wheelchair assist level: Supervision/Verbal cueing Max wheelchair distance: 150+    Wheelchair 50 feet with 2 turns activity    Assist        Assist Level: Supervision/Verbal  cueing   Wheelchair 150 feet activity     Assist      Assist Level: Supervision/Verbal cueing   Medical Problem List and Plan: 1. 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  -we discussed timeline for prosthesis today  -grounds pass ordered -OK to DC today  2.  Antithrombotics: -DVT/anticoagulation:  Pharmaceutical: Heparin  5000U q8h             -antiplatelet therapy: ASA 81mg  daily 3. Pain Management: robaxin  PRN. Oxy-APAP 5-325mg  1-2 tabs q4h PRN -only using prn robaxin  rarely -tylenol  prn 4. Mood/Behavior/Sleep/hx of PTSD and depression/anxiety: LCSW to follow for evaluation and support.              -antipsychotic agents: N/A             -Continue Bupropion  150mg  XR daily, Duloxetine  30mg  daily. Also has service dog.              -Melatonin 5mg  PRN--using with good results 5. Neuropsych/cognition: This patient is capable of making decisions on his own behalf. 6. Skin/Wound Care: 6/5  can keep small piece of gauze on incision for now as the dried s/s drainage within it has adhered to incision. The rest of area looks better   -maintain shrinker for compression and protection 7. Fluids/Electrolytes/Nutrition:  . Continue supplements and vitamins.   -albumin  is low.  -encouraged PO, protein supps 8. HTN:BP reviewed and soft, discussed with patient, continue to hold home lisinopril   -Continued, continue current regimen      12/03/2023    5:17 AM 12/02/2023    7:50 PM 12/02/2023    1:00 PM  Vitals with BMI  Systolic 104 118 161  Diastolic 51 73 68  Pulse 69 76 91     9. Cellulitis?: Residual limb edematous and tight with erythema extendin medially to mid thigh. Keflex  initiated by PA for wound prophylaxis.              -will continue  for now as pt tolerating med without problems   -wbc's normal at 7  Continue keflex  for 5 days total and then reassess  -5/9 improved WBC normal at 8.7  10. CAD s/p PCI: Monitor for symptoms with increase in  activity  11. 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 Constipation: Senna added   -LBM 6/7 13.HLD: continue Atorvastatin  80mg  daily 14. BPH: continue Flomax  0.4mg  daily, discussed that he has been on this for a long time  -voiding without issues 15. ABLA: pt only with mild drainage from incision  -6/9 stable at 10.8      Latest Ref Rng & Units 12/03/2023    5:12 AM 11/29/2023    5:49 AM 11/27/2023    5:13 AM  CBC  WBC 4.0 - 10.5 K/uL 8.7  9.3  7.0   Hemoglobin 13.0 - 17.0 g/dL 78.2  95.6  21.3   Hematocrit 39.0 - 52.0 % 31.6  31.1  30.9   Platelets 150 - 400 K/uL 373  321  255     16. Hyponatremia: improved-->139 6/5  -6/8 stable at 140 17. GERD/GI ppx: Managed with Protonix  40mg  daily    LOS: 7 days   Lylia Sand 12/03/2023, 11:13 AM    pr

## 2023-12-03 NOTE — Progress Notes (Signed)
 Occupational Therapy Discharge Summary  Patient Details  Name: [name] [name] MRN: [id number] Date of Birth: [birth year]  Date of Discharge from OT service:December 03, 2023     Patient has met 11 of 11 long term goals due to improved activity tolerance, improved balance, and ability to compensate for deficits.  Patient to discharge at overall Modified Independent level.  Patient's care partner is independent to provide the necessary physical assistance at discharge.    Reasons goals not met: NA  Recommendation:  Patient will benefit from ongoing skilled OT services in home health setting to continue to advance functional skills in the area of BADL and iADL.  Equipment: No equipment provided  Reasons for discharge: treatment goals met  Patient/family agrees with progress made and goals achieved: Yes  OT Discharge  ADL ADL Eating: Modified independent Where Assessed-Eating: Chair Grooming: Modified independent Where Assessed-Grooming: Sitting at sink Upper Body Bathing: Modified independent Where Assessed-Upper Body Bathing: Shower Lower Body Bathing: Modified independent Where Assessed-Lower Body Bathing: Shower Upper Body Dressing: Modified independent (Device) Where Assessed-Upper Body Dressing: Chair Lower Body Dressing: Modified independent Where Assessed-Lower Body Dressing: Standing at sink Toileting: Modified independent Where Assessed-Toileting: Teacher, adult education: Engineer, agricultural Method: Stand pivot (RW) Acupuncturist: Acupuncturist: Cytogeneticist Method: Stand pivot Vision Baseline Vision/History: 1 Wears glasses (readers) Patient Visual Report: No change from baseline Vision Assessment?: No apparent visual deficits Perception  Perception: Within Functional Limits Praxis Praxis: WFL Cognition Cognition Overall Cognitive Status: Within Functional Limits for tasks  assessed Arousal/Alertness: Awake/alert Brief Interview for Mental Status (BIMS) Repetition of Three Words (First Attempt): 3 Temporal Orientation: Year: Correct Temporal Orientation: Month: Accurate within 5 days Temporal Orientation: Day: Correct Recall: "Sock": Yes, no cue required Recall: "Blue": Yes, no cue required Recall: "Bed": Yes, no cue required BIMS Summary Score: 15 Sensation Sensation Light Touch: Impaired by gross assessment Hot/Cold: Appears Intact Proprioception: Appears Intact Stereognosis: Not tested Additional Comments: pt reprots sensation is improving and decreased numbness and tingly noted Coordination Gross Motor Movements are Fluid and Coordinated: No Fine Motor Movements are Fluid and Coordinated: Yes Motor  Motor Motor: Other (comment) Motor - Discharge Observations: L BKA but Motor ability improved from eval Mobility  Bed Mobility Bed Mobility: Supine to Sit;Sit to Supine Supine to Sit: Independent with assistive device Sit to Supine: Independent with assistive device Transfers Sit to Stand: Independent with assistive device Stand to Sit: Independent with assistive device  Trunk/Postural Assessment  Cervical Assessment Cervical Assessment: Exceptions to [facility] Thoracic Assessment Thoracic Assessment: Exceptions to [facility] Lumbar Assessment Lumbar Assessment: Exceptions to [facility] Postural Control Postural Control: Within Functional Limits  Balance Static Sitting Balance Static Sitting - Balance Support: Feet supported Static Sitting - Level of Assistance: 7: Independent Dynamic Sitting Balance Dynamic Sitting - Balance Support: During functional activity Dynamic Sitting - Level of Assistance: 6: Modified independent (Device/Increase time) Static Standing Balance Static Standing - Balance Support: Bilateral upper extremity supported;During functional activity Static Standing - Level of Assistance: 6: Modified independent (Device/Increase  time) Dynamic Standing Balance Dynamic Standing - Level of Assistance: 6: Modified independent (Device/Increase time) Extremity/Trunk Assessment RUE Assessment RUE Assessment: Within Functional Limits LUE Assessment LUE Assessment: Within Functional Limits   Willadean Hark 12/03/2023, 8:45 AM

## 2023-12-03 NOTE — Progress Notes (Signed)
 Patient ID: [name] [name], male   DOB: 1945-10-09, 78 y.o.   MRN: [id number] Pt is requesting to go home today. Team and MD feel met goals and good to go.

## 2023-12-03 NOTE — Progress Notes (Signed)
 Inpatient Rehabilitation Discharge Medication Review by a Pharmacist  A complete drug regimen review was completed for this patient to identify any potential clinically significant medication issues.  High Risk Drug Classes Is patient taking? Indication by Medication  Antipsychotic No   Anticoagulant No   Antibiotic No   Opioid Yes Oxycodone /APAP - PRN pain  Antiplatelet Yes Aspirin  - PVD  Hypoglycemics/insulin No   Vasoactive Medication No   Chemotherapy No   Other Yes Docusate - constipation Atorvastatin  - HLD Duloxetine , Bupropion  - mood Refresh eye drops - PRN dry eyes Vit B12 - supplement Omeprazole - GERD ppx Tamsulosin  - urinary retention     Type of Medication Issue Identified Description of Issue Recommendation(s)  Drug Interaction(s) (clinically significant)     Duplicate Therapy     Allergy     No Medication Administration End Date     Incorrect Dose     Additional Drug Therapy Needed     Significant med changes from prior encounter (inform family/care partners about these prior to discharge). Nitroglycerin  patch stopped at DC Communicate relevant medication changes to patient/family members at discharge from CIR.   Other       Clinically significant medication issues were identified that warrant physician communication and completion of prescribed/recommended actions by midnight of the next day:  No   Pharmacist comments: n/a   Time spent performing this drug regimen review (minutes): 20   Thank you for allowing pharmacy to be a part of this patient's care.  Claudia Cuff, PharmD, BCPS Clinical Pharmacist

## 2023-12-03 NOTE — Progress Notes (Signed)
 Occupational Therapy Note  Patient Details  Name: [name] [name] MRN: [id number] Date of Birth: [birth year]  Today's Date: 12/03/2023 OT Missed Time: 45 Minutes Missed Time Reason: Other (comment) (MD/PA allowed pt to DC early)   Pt declined group as medical team allowed pt to DC early.   Mollie Anger [facility] 12/03/2023, 12:23 PM

## 2023-12-05 ENCOUNTER — Telehealth: Payer: Self-pay

## 2023-12-05 NOTE — Telephone Encounter (Signed)
 Pt. Received a call stating he needs to set up an appointment with Dr. Estill Hemming. He want to know the reason why?

## 2023-12-11 ENCOUNTER — Telehealth: Payer: Self-pay

## 2023-12-11 NOTE — Telephone Encounter (Signed)
 Triage: -pt called stating that a HH PT was there and stated he needed to report there was some redness at his amputation site.   -returned called to pt & he requested I speak to Terri, HHPT, who states there is a small yellow spot at the medial aspect of the incision that is slightly red and small amount of draining serosanguinous fluid.  He does not feel there is any obvious signs of infection but thinks we should be aware. -Terri states he can call on Thursday to report any changes.   -advised Terri to ensure pt is washing his leg preferably with antibiotic soap and pat dry.  Ensure his shrinker sock is clean as well.  Pt has f/u appt scheduled on 06/30 and monitoring the site is essential.

## 2023-12-13 ENCOUNTER — Telehealth: Payer: Self-pay

## 2023-12-13 NOTE — Telephone Encounter (Signed)
 Patient called [name] [name] reporting puss-like drainage from anterior left BKA incision. Pt reports redness and warmth around the incision.  Appt made with PA on 12/14/23 for incision check.

## 2023-12-14 ENCOUNTER — Encounter: Payer: Self-pay | Admitting: Physician Assistant

## 2023-12-14 ENCOUNTER — Other Ambulatory Visit (HOSPITAL_COMMUNITY): Payer: Self-pay

## 2023-12-14 ENCOUNTER — Ambulatory Visit: Attending: Surgery | Admitting: Physician Assistant

## 2023-12-14 VITALS — BP 116/68 | HR 72 | Temp 98.1°F | Ht 69.0 in | Wt 201.0 lb

## 2023-12-14 DIAGNOSIS — Z89512 Acquired absence of left leg below knee: Secondary | ICD-10-CM

## 2023-12-14 MED ORDER — CEPHALEXIN 500 MG PO CAPS
500.0000 mg | ORAL_CAPSULE | Freq: Two times a day (BID) | ORAL | 0 refills | Status: DC
  Filled 2023-12-14: qty 14, 7d supply, fill #0

## 2023-12-14 NOTE — Progress Notes (Signed)
 POST OPERATIVE OFFICE NOTE    CC:  F/u for surgery  HPI:  This is a 78 y.o. 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 Charlotte Cookey for intractable left leg pain.    Pt returns today for follow up.  Pt states he was at PT and when the physical therapist checked the incision a couple of days ago, there was concern about infection and purulent drainage.    He denies any fever or chills.  He states that he has been showering with the limb guard on with a bag over it so he didn't get the stump wet.  He states that since the amputation, he has actually been able to sleep and feels much better.  He has f/u on 12/24/2023.     Allergies  Allergen Reactions   Dilaudid [Hydromorphone Hcl] Other (See Comments)    CRASHES per patient Talked to nurse and she said pt is afraid to take dilaudid because pt states he flat-lined when they gave it to him.Aaron AasAaron AasPt doesn't mind taking Percocet    Clindamycin/Lincomycin Other (See Comments)    Blisters in mouth   Oxcarbazepine Other (See Comments)    Other reaction(s): Delirium    Current Outpatient Medications  Medication Sig Dispense Refill   aspirin  EC 81 MG tablet Take 81 mg by mouth daily.     atorvastatin  (LIPITOR ) 80 MG tablet Take 80 mg by mouth daily.     buPROPion  (WELLBUTRIN  XL) 150 MG 24 hr tablet Take 150 mg by mouth daily.     carboxymethylcellulose (REFRESH PLUS) 0.5 % SOLN Place 1 drop into both eyes 3 (three) times daily as needed (dry eyes).     cyanocobalamin  (VITAMIN B12) 500 MCG tablet Take 500 mcg by mouth daily.     docusate sodium  (COLACE) 100 MG capsule Take 1 capsule (100 mg total) by mouth daily.     DULoxetine  (CYMBALTA ) 30 MG capsule Take 30 mg by mouth daily.     omeprazole (PRILOSEC) 20 MG capsule Take 20 mg by mouth daily.     oxyCODONE -acetaminophen  (PERCOCET/ROXICET) 5-325 MG tablet Take 1-2 tablets by mouth every 8 (eight) hours as needed for moderate pain (pain score 4-6). 21 tablet 0   tamsulosin  (FLOMAX ) 0.4 MG CAPS  capsule Take 0.4 mg by mouth daily.     No current facility-administered medications for this visit.     ROS:  See HPI  Physical Exam:  Today's Vitals   12/14/23 1234  BP: 116/68  Pulse: 72  Temp: 98.1 F (36.7 C)  TempSrc: Temporal  SpO2: 91%  Weight: 201 lb (91.2 kg)  Height: 5' 9 (1.753 m)  PainSc: 4   PainLoc: Leg   Body mass index is 29.68 kg/m.   Incision:    Just lateral to midline, there is some serous drainage from the incision.  There is also some fibrinous tissue there as well.  I did try to probe this and there was no tract.          Assessment/Plan:  This is a 78 y.o. 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 Charlotte Cookey for intractable left leg pain.    -I am not able to express any purulent drainage.  He does have some serosanguinous drainage from the lateral aspect of midline incision.  He has not had fever or chills.  I have sent Keflex  500 mg bid x 7 days prophylactic treatment to the [facility].  I have asked him to not  use the shrinker sock for now and use kerlix and ace wrap with snug compression.  I showed his wife how to wrap it to get some compression to hopefully help with the swelling.  Looking back at the pictures, he did have some serosanguinous drainage earlier in the month as well.   -I have instructed him to shower daily with soap and water and that it is ok to get the stump wet and pat dry then place the bandage as above.    -he will keep his appt on 6/30.  He and his wife know to call if they have concerns about worsening issues.    -of note, his preoperative leg pain is much improved with occasional phantom pain.  He has been able to sleep through the night and feels much better.    Maryanna Smart, [facility] [phone]   Clinic MD:  Susi Eric

## 2023-12-21 NOTE — Progress Notes (Unsigned)
  POST OPERATIVE OFFICE NOTE    CC:  F/u for surgery  HPI:  This is a 78 y.o. male who is s/p  left BKA on 11/21/2023 by Dr. Serene for intractable left leg pain.  I saw him last week with concerns for infection and possible purulent drainage.  He was not having any fever or chills. He was actually sleeping better since amputation and pre operative pain was improved.  He was placed on Keflex  500 mg bid x 7 days and instructed to return today for his regularly scheduled visit.   Pt returns today for follow up.  Pt states ***   Allergies  Allergen Reactions   Dilaudid [Hydromorphone Hcl] Other (See Comments)    CRASHES per patient Talked to nurse and she said pt is afraid to take dilaudid because pt states he flat-lined when they gave it to him.SABRASABRAPt doesn't mind taking Percocet    Clindamycin/Lincomycin Other (See Comments)    Blisters in mouth   Oxcarbazepine Other (See Comments)    Other reaction(s): Delirium    Current Outpatient Medications  Medication Sig Dispense Refill   aspirin  EC 81 MG tablet Take 81 mg by mouth daily.     atorvastatin  (LIPITOR ) 80 MG tablet Take 80 mg by mouth daily.     buPROPion  (WELLBUTRIN  XL) 150 MG 24 hr tablet Take 150 mg by mouth daily.     carboxymethylcellulose (REFRESH PLUS) 0.5 % SOLN Place 1 drop into both eyes 3 (three) times daily as needed (dry eyes).     cephALEXin  (KEFLEX ) 500 MG capsule Take 1 capsule (500 mg total) by mouth 2 (two) times daily. 14 capsule 0   cyanocobalamin  (VITAMIN B12) 500 MCG tablet Take 500 mcg by mouth daily.     docusate sodium  (COLACE) 100 MG capsule Take 1 capsule (100 mg total) by mouth daily.     DULoxetine  (CYMBALTA ) 30 MG capsule Take 30 mg by mouth daily.     omeprazole (PRILOSEC) 20 MG capsule Take 20 mg by mouth daily.     oxyCODONE -acetaminophen  (PERCOCET/ROXICET) 5-325 MG tablet Take 1-2 tablets by mouth every 8 (eight) hours as needed for moderate pain (pain score 4-6). 21 tablet 0   tamsulosin  (FLOMAX )  0.4 MG CAPS capsule Take 0.4 mg by mouth daily.     No current facility-administered medications for this visit.     ROS:  See HPI  Physical Exam:  ***  Incision:  *** Extremities:  *** Neuro: *** Abdomen:  ***    Assessment/Plan:  This is a 78 y.o. male who is s/p:  left BKA on 11/21/2023 by Dr. Serene for intractable left leg pain.    -***   Lucie Apt, [facility] [phone]   Clinic MD:  Serene

## 2023-12-24 ENCOUNTER — Ambulatory Visit: Attending: Surgery | Admitting: Physician Assistant

## 2023-12-24 ENCOUNTER — Other Ambulatory Visit (HOSPITAL_COMMUNITY): Payer: Self-pay

## 2023-12-24 ENCOUNTER — Encounter: Payer: Self-pay | Admitting: Physician Assistant

## 2023-12-24 VITALS — BP 136/77 | HR 72 | Temp 98.1°F | Ht 69.0 in | Wt 201.0 lb

## 2023-12-24 DIAGNOSIS — Z89512 Acquired absence of left leg below knee: Secondary | ICD-10-CM

## 2023-12-24 MED ORDER — DOXYCYCLINE HYCLATE 100 MG PO CAPS
100.0000 mg | ORAL_CAPSULE | Freq: Two times a day (BID) | ORAL | 0 refills | Status: DC
  Filled 2023-12-24: qty 28, 14d supply, fill #0

## 2023-12-25 ENCOUNTER — Other Ambulatory Visit: Payer: Self-pay

## 2023-12-25 ENCOUNTER — Telehealth: Payer: Self-pay

## 2023-12-25 DIAGNOSIS — Z89512 Acquired absence of left leg below knee: Secondary | ICD-10-CM

## 2023-12-25 NOTE — Telephone Encounter (Signed)
 The patient called with a question regarding the [facility] RN coming to change his dressing. HH was contacted and the first visit is scheduled for 12/27/2023. The patient is scheduled to come in 12/26/2023 for a dressing change in this office. The appointment date and time was given to the patient and he verbalized understanding.

## 2023-12-26 ENCOUNTER — Ambulatory Visit: Attending: Vascular Surgery | Admitting: Physician Assistant

## 2023-12-26 ENCOUNTER — Encounter: Payer: Self-pay | Admitting: Physician Assistant

## 2023-12-26 DIAGNOSIS — Z89512 Acquired absence of left leg below knee: Secondary | ICD-10-CM

## 2023-12-26 NOTE — Progress Notes (Signed)
  Progress Note    12/26/2023 11:49 AM   Subjective:  here with is wife.  Was brought back today for nurse visit for dressing change since [facility] could not come until tomorrow.     Physical Exam: Incisions:  as pictured.  Still tender on the lateral aspect. Mild odor       CBC    Component Value Date/Time   WBC 8.7 12/03/2023 0512   RBC 3.53 (L) 12/03/2023 0512   HGB 10.8 (L) 12/03/2023 0512   HCT 31.6 (L) 12/03/2023 0512   PLT 373 12/03/2023 0512   MCV 89.5 12/03/2023 0512   MCH 30.6 12/03/2023 0512   MCHC 34.2 12/03/2023 0512   RDW 11.7 12/03/2023 0512   LYMPHSABS 2.3 12/03/2023 0512   MONOABS 0.8 12/03/2023 0512   EOSABS 0.2 12/03/2023 0512   BASOSABS 0.0 12/03/2023 0512    BMET    Component Value Date/Time   NA 140 12/03/2023 0512   K 4.2 12/03/2023 0512   CL 103 12/03/2023 0512   CO2 30 12/03/2023 0512   GLUCOSE 115 (H) 12/03/2023 0512   BUN 11 12/03/2023 0512   CREATININE 0.73 12/03/2023 0512   CALCIUM  9.0 12/03/2023 0512   GFRNONAA >60 12/03/2023 0512   GFRAA >60 07/31/2019 1241    INR    Component Value Date/Time   INR 1.1 11/13/2023 1100       Assessment/Plan:  78 y.o. male is s/p left below knee amputation 11/21/2023 by Dr. Serene for intractable leg pain  -pt comes in today for dressing change since [facility] could not start coming until tomorrow.  Dressings removed and stump appears unchanged from earlier in the week.  He still has significant tenderness on the lateral aspect of the incision.   -I repacked both with a saline soaked 2x2 gauze and instructed wife on how to do this since [facility] can only come 3 days a week.  I did give them supplies today.   -I discussed with both of them to come back on Monday for check.  If he continues to have significant tenderness, he may need to go to the OR for washout.  Will have Dr. Serene come see him on Monday when he returns to our office.  They are in agreement with this plan.    Lucie Apt, PA-C [facility] [phone] 12/26/2023 11:49 AM

## 2023-12-31 ENCOUNTER — Telehealth: Payer: Self-pay

## 2023-12-31 ENCOUNTER — Other Ambulatory Visit: Payer: Self-pay

## 2023-12-31 ENCOUNTER — Encounter: Payer: Self-pay | Admitting: Physician Assistant

## 2023-12-31 ENCOUNTER — Encounter (HOSPITAL_COMMUNITY): Payer: Self-pay | Admitting: Surgery

## 2023-12-31 ENCOUNTER — Ambulatory Visit: Attending: Surgery | Admitting: Physician Assistant

## 2023-12-31 VITALS — BP 126/74 | HR 80 | Temp 98.1°F | Ht 69.0 in | Wt 201.0 lb

## 2023-12-31 DIAGNOSIS — Z89512 Acquired absence of left leg below knee: Secondary | ICD-10-CM

## 2023-12-31 NOTE — Telephone Encounter (Signed)
 Per [facility] at [facility] - patient is authorized for [facility] care re: L BKA for 180 days post surgery (May 2025).  Authorization # is [id number] Community Care: [phone]

## 2023-12-31 NOTE — Progress Notes (Signed)
 POST OPERATIVE OFFICE NOTE    CC:  F/u for surgery  HPI:  This is a 78 y.o. 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 Serene for intractable left leg pain.  Pt has been seen back in the office a couple of times and has been undergoing wet to dry dressing changes to two areas on his BKA stump.    At last visit, he was very tender in the lateral aspect.  He comes in today for wound check.  We had discussed he may need washout in the OR.  He has been on a 2 week course of Doxycycline .   Pt returns today for follow up and here with his wife.  Pt states that it was just too painful to do the dressing change last night.  He continues to sleep well at night.  He has not had any fever or chills.  He is still taking his abx.    Allergies  Allergen Reactions   Dilaudid [Hydromorphone Hcl] Other (See Comments)    CRASHES per patient Talked to nurse and she said pt is afraid to take dilaudid because pt states he flat-lined when they gave it to him.SABRASABRAPt doesn't mind taking Percocet    Clindamycin/Lincomycin Other (See Comments)    Blisters in mouth   Oxcarbazepine Other (See Comments)    Other reaction(s): Delirium    Current Outpatient Medications  Medication Sig Dispense Refill   aspirin  EC 81 MG tablet Take 81 mg by mouth daily.     atorvastatin  (LIPITOR ) 80 MG tablet Take 80 mg by mouth daily.     buPROPion  (WELLBUTRIN  XL) 150 MG 24 hr tablet Take 150 mg by mouth daily.     carboxymethylcellulose (REFRESH PLUS) 0.5 % SOLN Place 1 drop into both eyes 3 (three) times daily as needed (dry eyes).     cephALEXin  (KEFLEX ) 500 MG capsule Take 1 capsule (500 mg total) by mouth 2 (two) times daily. 14 capsule 0   cyanocobalamin  (VITAMIN B12) 500 MCG tablet Take 500 mcg by mouth daily.     docusate sodium  (COLACE) 100 MG capsule Take 1 capsule (100 mg total) by mouth daily.     doxycycline  (VIBRAMYCIN ) 100 MG capsule Take 1 capsule (100 mg total) by mouth 2 (two) times daily for 14 days. 28  capsule 0   DULoxetine  (CYMBALTA ) 30 MG capsule Take 30 mg by mouth daily.     omeprazole (PRILOSEC) 20 MG capsule Take 20 mg by mouth daily.     oxyCODONE -acetaminophen  (PERCOCET/ROXICET) 5-325 MG tablet Take 1-2 tablets by mouth every 8 (eight) hours as needed for moderate pain (pain score 4-6). 21 tablet 0   tamsulosin  (FLOMAX ) 0.4 MG CAPS capsule Take 0.4 mg by mouth daily.     No current facility-administered medications for this visit.     ROS:  See HPI  Physical Exam:  Today's Vitals   12/31/23 0927  BP: 126/74  Pulse: 80  Temp: 98.1 F (36.7 C)  TempSrc: Temporal  SpO2: 98%  Weight: 201 lb (91.2 kg)  Height: 5' 9 (1.753 m)   Body mass index is 29.68 kg/m.   Incision:  not making any progress as pictured; fibrinous changes to anterior portion of incision.         Assessment/Plan:  This is a 78 y.o. 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 Serene for intractable leg pain   -pt seen with Dr. Serene.  Discussed proceeding to OR  tomorrow for washout or possible revision a little bit more proximal but still below the knee.  Discussed that he may require a wound vac.  May be able to go home same day or possibly need one night in hospital.  He will be scheduled at 1pm and arrive at hospital around noon as he has prior engagement for the am.  If he needs admission, will try to get admitted to [facility] tower.  -Dr. Serene discussed with pt that this is going to be a small setback for getting prosthesis.   Lucie Apt, [facility] [phone]   Clinic MD:  Serene

## 2023-12-31 NOTE — Progress Notes (Signed)
 SDW call  Patient was given pre-op instructions over the phone. Patient verbalized understanding of instructions provided.     PCP - Dr. Odessa Blanch, [place], [place] at [facility] Cardiologist - Dr. Helene Bunker, [place] 11/14/2023 Pulmonary:    PPM/ICD - denies Device Orders - na Rep Notified - na   Chest x-ray - na EKG -  11/13/2023 Stress Test - 2015 - [facility] ECHO - 04/13/2012 Cardiac Cath - 11/12/2021  Sleep Study/sleep apnea/CPAP: use to have OSA and CPAP.  Had sinus surgery and no longer uses CPAP or dx OSA  Non-diabetic  Blood Thinner Instructions: denies Aspirin  Instructions: Continue   ERAS Protcol - NPO  Anesthesia review: Yes. CAD, HTN, MI, stroke, PVD, OSA, [place] reviewed 11/13/2023   Patient denies shortness of breath, fever, cough and chest pain over the phone call  Your procedure is scheduled on Tuesday January 01, 2024  Report to [facility] Main Entrance A at 1030   A.M., then check in with the Admitting office.  Call this number if you have problems the morning of surgery:  [phone]   If you have any questions prior to your surgery date call [phone]: Open Monday-Friday 8am-4pm If you experience any cold or flu symptoms such as cough, fever, chills, shortness of breath, etc. between now and your scheduled surgery, please notify us  at the above number    Remember:  Do not eat or drink after midnight the night before your surgery Take these medicines the morning of surgery with A SIP OF WATER:  ASA, atorvastatin , wellbutrin , doxycycline , cymbalta , prilosec, flomax   As needed: Refresh eye drops, oxycodone   As of today, STOP taking any Aleve, Naproxen, Ibuprofen , Motrin , Advil , Goody's, BC's, all herbal medications, fish oil, and all vitamins.

## 2023-12-31 NOTE — H&P (View-Only) (Signed)
 POST OPERATIVE OFFICE NOTE    CC:  F/u for surgery  HPI:  This is a 78 y.o. 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 Serene for intractable left leg pain.  Pt has been seen back in the office a couple of times and has been undergoing wet to dry dressing changes to two areas on his BKA stump.    At last visit, he was very tender in the lateral aspect.  He comes in today for wound check.  We had discussed he may need washout in the OR.  He has been on a 2 week course of Doxycycline .   Pt returns today for follow up and here with his wife.  Pt states that it was just too painful to do the dressing change last night.  He continues to sleep well at night.  He has not had any fever or chills.  He is still taking his abx.    Allergies  Allergen Reactions   Dilaudid [Hydromorphone Hcl] Other (See Comments)    CRASHES per patient Talked to nurse and she said pt is afraid to take dilaudid because pt states he flat-lined when they gave it to him.SABRASABRAPt doesn't mind taking Percocet    Clindamycin/Lincomycin Other (See Comments)    Blisters in mouth   Oxcarbazepine Other (See Comments)    Other reaction(s): Delirium    Current Outpatient Medications  Medication Sig Dispense Refill   aspirin  EC 81 MG tablet Take 81 mg by mouth daily.     atorvastatin  (LIPITOR ) 80 MG tablet Take 80 mg by mouth daily.     buPROPion  (WELLBUTRIN  XL) 150 MG 24 hr tablet Take 150 mg by mouth daily.     carboxymethylcellulose (REFRESH PLUS) 0.5 % SOLN Place 1 drop into both eyes 3 (three) times daily as needed (dry eyes).     cephALEXin  (KEFLEX ) 500 MG capsule Take 1 capsule (500 mg total) by mouth 2 (two) times daily. 14 capsule 0   cyanocobalamin  (VITAMIN B12) 500 MCG tablet Take 500 mcg by mouth daily.     docusate sodium  (COLACE) 100 MG capsule Take 1 capsule (100 mg total) by mouth daily.     doxycycline  (VIBRAMYCIN ) 100 MG capsule Take 1 capsule (100 mg total) by mouth 2 (two) times daily for 14 days. 28  capsule 0   DULoxetine  (CYMBALTA ) 30 MG capsule Take 30 mg by mouth daily.     omeprazole (PRILOSEC) 20 MG capsule Take 20 mg by mouth daily.     oxyCODONE -acetaminophen  (PERCOCET/ROXICET) 5-325 MG tablet Take 1-2 tablets by mouth every 8 (eight) hours as needed for moderate pain (pain score 4-6). 21 tablet 0   tamsulosin  (FLOMAX ) 0.4 MG CAPS capsule Take 0.4 mg by mouth daily.     No current facility-administered medications for this visit.     ROS:  See HPI  Physical Exam:  Today's Vitals   12/31/23 0927  BP: 126/74  Pulse: 80  Temp: 98.1 F (36.7 C)  TempSrc: Temporal  SpO2: 98%  Weight: 201 lb (91.2 kg)  Height: 5' 9 (1.753 m)   Body mass index is 29.68 kg/m.   Incision:  not making any progress as pictured; fibrinous changes to anterior portion of incision.         Assessment/Plan:  This is a 78 y.o. 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 Serene for intractable leg pain   -pt seen with Dr. Serene.  Discussed proceeding to OR  tomorrow for washout or possible revision a little bit more proximal but still below the knee.  Discussed that he may require a wound vac.  May be able to go home same day or possibly need one night in hospital.  He will be scheduled at 1pm and arrive at hospital around noon as he has prior engagement for the am.  If he needs admission, will try to get admitted to [facility] tower.  -Dr. Serene discussed with pt that this is going to be a small setback for getting prosthesis.   Lucie Apt, [facility] [phone]   Clinic MD:  Serene

## 2023-12-31 NOTE — Progress Notes (Signed)
 Anesthesia Chart Review: Same day workup  78 year old male [facility] of [facility] s/p PCI with unknown details prior to 2017, DES to [facility] and M LCx in 09/2015, and most recently DES to LCx in 10/2021.  Last seen by Dr. Helene Bunker 11/14/2023 for preop evaluation.  Per note, preop risk stratification: intermediate risk for leg amputation but very stable from a cardiac perspective; no further testing needed from our end prior to surgery - normal LV function at last check - not on a bblocker; no indication at this point - would ideally continue aspirin  through surgery.   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  Recently underwent left BKA 11/21/2023 for intractable pain.  Now with wound dehiscence requiring revision.   Patient will need day of surgery labs and evaluation.   EKG 11/13/2023: Normal sinus rhythm.  Rate 61. Non-specific intra-ventricular conduction delay   Cath and PCI 10/31/2021 (Care Everywhere): PCI of mid LCX  Left radial artery - 5/29F sheath  2.25/23mm Xience DES; post-dilated with 2.23mm noncompliant balloon: 80%  to 0  EBL<70ml  Heparin  used; aspirin /clopidogrel   Coronary Findings Diagnostic Dominance: Right  Left Circumflex: Mid Cx lesion is 80% stenosed. Culprit lesion. Lesion length: 19 mm. TIMI flow is 3.      Lynwood Geofm RIGGERS [facility] Stay [facility]/Anesthesiology Phone [phone] 12/31/2023 11:19 AM

## 2023-12-31 NOTE — Anesthesia Preprocedure Evaluation (Signed)
 Anesthesia Evaluation  Patient identified by MRN, date of birth, ID band Patient awake    Reviewed: Allergy & Precautions, NPO status , Patient's Chart, lab work & pertinent test results  History of Anesthesia Complications Negative for: history of anesthetic complications  Airway Mallampati: II  TM Distance: >3 FB Neck ROM: Full    Dental  (+) Dental Advisory Given, Edentulous Upper   Pulmonary sleep apnea , former smoker   Pulmonary exam normal        Cardiovascular hypertension, + CAD, + Past MI, + Cardiac Stents and + Peripheral Vascular Disease  Normal cardiovascular exam     Neuro/Psych  PSYCHIATRIC DISORDERS Anxiety Depression    CVA, Residual Symptoms    GI/Hepatic ,GERD  Medicated and Controlled,,(+) Hepatitis -, A  Endo/Other  negative endocrine ROS    Renal/GU negative Renal ROS     Musculoskeletal negative musculoskeletal ROS (+)    Abdominal   Peds  Hematology negative hematology ROS (+)   Anesthesia Other Findings   Reproductive/Obstetrics                              Anesthesia Physical Anesthesia Plan  ASA: 3  Anesthesia Plan: General   Post-op Pain Management: Tylenol  PO (pre-op)*   Induction: Intravenous  PONV Risk Score and Plan: 2 and Treatment may vary due to age or medical condition, Ondansetron  and Dexamethasone   Airway Management Planned: LMA  Additional Equipment: None  Intra-op Plan:   Post-operative Plan: Extubation in OR  Informed Consent: I have reviewed the patients History and Physical, chart, labs and discussed the procedure including the risks, benefits and alternatives for the proposed anesthesia with the patient or authorized representative who has indicated his/her understanding and acceptance.     Dental advisory given  Plan Discussed with: CRNA and Anesthesiologist  Anesthesia Plan Comments: (PAT note by Lynwood Hope,  PA-C: 78 year old male [facility] of [facility] s/p PCI with unknown details prior to 2017, DES to [facility] and M LCx in 09/2015, and most recently DES to LCx in 10/2021.  Last seen by Dr. Helene Bunker 11/14/2023 for preop evaluation.  Per note, preop risk stratification: intermediate risk for leg amputation but very stable from a cardiac perspective; no further testing needed from our end prior to surgery - normal LV function at last check - not on a bblocker; no indication at this point - would ideally continue aspirin  through surgery.   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  Recently underwent left BKA 11/21/2023 for intractable pain.  Now with wound dehiscence requiring revision.   Patient will need day of surgery labs and evaluation.   EKG 11/13/2023: Normal sinus rhythm.  Rate 61. Non-specific intra-ventricular conduction delay   Cath and PCI 10/31/2021 (Care Everywhere): PCI of mid LCX  Left radial artery - 5/16F sheath  2.25/23mm Xience DES; post-dilated with 2.20mm noncompliant balloon: 80%  to 0  EBL<8ml  Heparin  used; aspirin /clopidogrel   Coronary Findings Diagnostic Dominance: Right  Left Circumflex: Mid Cx lesion is 80% stenosed. Culprit lesion. Lesion length: 19 mm. TIMI flow is 3.   )         Anesthesia Quick Evaluation

## 2024-01-01 ENCOUNTER — Encounter (HOSPITAL_COMMUNITY): Admitting: Physician Assistant

## 2024-01-01 ENCOUNTER — Other Ambulatory Visit: Payer: Self-pay

## 2024-01-01 ENCOUNTER — Inpatient Hospital Stay (HOSPITAL_COMMUNITY)
Admission: AD | Admit: 2024-01-01 | Discharge: 2024-01-05 | DRG: 464 | Disposition: A | Discharge status: Home Health | Attending: Surgery | Admitting: Surgery

## 2024-01-01 ENCOUNTER — Encounter (HOSPITAL_COMMUNITY)
Admission: AD | Disposition: A | Discharge status: Home Health | Payer: Self-pay | Source: Ambulatory Visit | Attending: Surgery

## 2024-01-01 DIAGNOSIS — Z885 Allergy status to narcotic agent status: Secondary | ICD-10-CM

## 2024-01-01 DIAGNOSIS — Z881 Allergy status to other antibiotic agents status: Secondary | ICD-10-CM | POA: Diagnosis not present

## 2024-01-01 DIAGNOSIS — Z79899 Other long term (current) drug therapy: Secondary | ICD-10-CM

## 2024-01-01 DIAGNOSIS — I10 Essential (primary) hypertension: Secondary | ICD-10-CM | POA: Diagnosis not present

## 2024-01-01 DIAGNOSIS — I69954 Hemiplegia and hemiparesis following unspecified cerebrovascular disease affecting left non-dominant side: Secondary | ICD-10-CM

## 2024-01-01 DIAGNOSIS — T8744 Infection of amputation stump, left lower extremity: Secondary | ICD-10-CM | POA: Diagnosis present

## 2024-01-01 DIAGNOSIS — T8754 Necrosis of amputation stump, left lower extremity: Secondary | ICD-10-CM | POA: Diagnosis not present

## 2024-01-01 DIAGNOSIS — F32A Depression, unspecified: Secondary | ICD-10-CM | POA: Diagnosis present

## 2024-01-01 DIAGNOSIS — T8149XA Infection following a procedure, other surgical site, initial encounter: Principal | ICD-10-CM | POA: Diagnosis present

## 2024-01-01 DIAGNOSIS — K219 Gastro-esophageal reflux disease without esophagitis: Secondary | ICD-10-CM | POA: Diagnosis present

## 2024-01-01 DIAGNOSIS — I2510 Atherosclerotic heart disease of native coronary artery without angina pectoris: Secondary | ICD-10-CM

## 2024-01-01 DIAGNOSIS — F419 Anxiety disorder, unspecified: Secondary | ICD-10-CM | POA: Diagnosis present

## 2024-01-01 DIAGNOSIS — T8781 Dehiscence of amputation stump: Secondary | ICD-10-CM | POA: Diagnosis present

## 2024-01-01 DIAGNOSIS — I739 Peripheral vascular disease, unspecified: Secondary | ICD-10-CM | POA: Diagnosis present

## 2024-01-01 DIAGNOSIS — Z7982 Long term (current) use of aspirin: Secondary | ICD-10-CM | POA: Diagnosis not present

## 2024-01-01 DIAGNOSIS — Z9889 Other specified postprocedural states: Secondary | ICD-10-CM | POA: Diagnosis not present

## 2024-01-01 DIAGNOSIS — L02416 Cutaneous abscess of left lower limb: Secondary | ICD-10-CM | POA: Diagnosis present

## 2024-01-01 DIAGNOSIS — Z89512 Acquired absence of left leg below knee: Secondary | ICD-10-CM

## 2024-01-01 DIAGNOSIS — Z87891 Personal history of nicotine dependence: Secondary | ICD-10-CM

## 2024-01-01 DIAGNOSIS — I252 Old myocardial infarction: Secondary | ICD-10-CM | POA: Diagnosis not present

## 2024-01-01 DIAGNOSIS — Z888 Allergy status to other drugs, medicaments and biological substances status: Secondary | ICD-10-CM

## 2024-01-01 DIAGNOSIS — B952 Enterococcus as the cause of diseases classified elsewhere: Secondary | ICD-10-CM | POA: Diagnosis present

## 2024-01-01 DIAGNOSIS — Z955 Presence of coronary angioplasty implant and graft: Secondary | ICD-10-CM

## 2024-01-01 DIAGNOSIS — Y835 Amputation of limb(s) as the cause of abnormal reaction of the patient, or of later complication, without mention of misadventure at the time of the procedure: Secondary | ICD-10-CM | POA: Diagnosis present

## 2024-01-01 HISTORY — PX: INCISION AND DRAINAGE OF WOUND: SHX1803

## 2024-01-01 HISTORY — PX: APPLICATION OF WOUND VAC: SHX5189

## 2024-01-01 LAB — POCT I-STAT, CHEM 8
BUN: 20 mg/dL (ref 8–23)
Calcium, Ion: 1.25 mmol/L (ref 1.15–1.40)
Chloride: 101 mmol/L (ref 98–111)
Creatinine, Ser: 0.7 mg/dL (ref 0.61–1.24)
Glucose, Bld: 109 mg/dL — ABNORMAL HIGH (ref 70–99)
HCT: 35 % — ABNORMAL LOW (ref 39.0–52.0)
Hemoglobin: 11.9 g/dL — ABNORMAL LOW (ref 13.0–17.0)
Potassium: 4.5 mmol/L (ref 3.5–5.1)
Sodium: 139 mmol/L (ref 135–145)
TCO2: 27 mmol/L (ref 22–32)

## 2024-01-01 LAB — CBC
HCT: 33.5 % — ABNORMAL LOW (ref 39.0–52.0)
Hemoglobin: 11.5 g/dL — ABNORMAL LOW (ref 13.0–17.0)
MCH: 30.1 pg (ref 26.0–34.0)
MCHC: 34.3 g/dL (ref 30.0–36.0)
MCV: 87.7 fL (ref 80.0–100.0)
Platelets: 270 K/uL (ref 150–400)
RBC: 3.82 MIL/uL — ABNORMAL LOW (ref 4.22–5.81)
RDW: 12 % (ref 11.5–15.5)
WBC: 10.5 K/uL (ref 4.0–10.5)
nRBC: 0 % (ref 0.0–0.2)

## 2024-01-01 LAB — CREATININE, SERUM
Creatinine, Ser: 0.88 mg/dL (ref 0.61–1.24)
GFR, Estimated: >60 mL/min (ref >60)

## 2024-01-01 SURGERY — IRRIGATION AND DEBRIDEMENT WOUND
Anesthesia: General | Site: Leg Lower | Laterality: Left

## 2024-01-01 MED ORDER — PANTOPRAZOLE SODIUM 40 MG PO TBEC
40.0000 mg | DELAYED_RELEASE_TABLET | Freq: Every day | ORAL | Status: DC
  Administered 2024-01-01 – 2024-01-05 (×5): 40 mg via ORAL
  Filled 2024-01-01 (×5): qty 1

## 2024-01-01 MED ORDER — FENTANYL CITRATE (PF) 100 MCG/2ML IJ SOLN
25.0000 ug | INTRAMUSCULAR | Status: DC | PRN
  Administered 2024-01-01 (×2): 50 ug via INTRAVENOUS

## 2024-01-01 MED ORDER — PROPOFOL 10 MG/ML IV BOLUS
INTRAVENOUS | Status: AC
  Filled 2024-01-01: qty 20

## 2024-01-01 MED ORDER — DROPERIDOL 2.5 MG/ML IJ SOLN: 0.6250 mg | Freq: Once | INTRAMUSCULAR | Status: DC | PRN

## 2024-01-01 MED ORDER — TRAMADOL HCL 50 MG PO TABS
50.0000 mg | ORAL_TABLET | Freq: Four times a day (QID) | ORAL | Status: DC | PRN
  Administered 2024-01-01 – 2024-01-05 (×2): 50 mg via ORAL
  Filled 2024-01-01 (×2): qty 1

## 2024-01-01 MED ORDER — PHENYLEPHRINE HCL-NACL 20-0.9 MG/250ML-% IV SOLN: INTRAVENOUS | Status: DC | PRN

## 2024-01-01 MED ORDER — DULOXETINE HCL 30 MG PO CPEP
30.0000 mg | ORAL_CAPSULE | Freq: Two times a day (BID) | ORAL | Status: DC
  Administered 2024-01-01 – 2024-01-05 (×8): 30 mg via ORAL
  Filled 2024-01-01 (×8): qty 1

## 2024-01-01 MED ORDER — PHENYLEPHRINE HCL-NACL 20-0.9 MG/250ML-% IV SOLN
INTRAVENOUS | Status: DC | PRN
  Administered 2024-01-01: 25 ug/min via INTRAVENOUS
  Administered 2024-01-01: 160 ug via INTRAVENOUS

## 2024-01-01 MED ORDER — LIDOCAINE 2% (20 MG/ML) 5 ML SYRINGE
INTRAMUSCULAR | Status: DC | PRN
  Administered 2024-01-01: 100 mg via INTRAVENOUS

## 2024-01-01 MED ORDER — LACTATED RINGERS IV SOLN: INTRAVENOUS | Status: DC

## 2024-01-01 MED ORDER — HEPARIN SODIUM (PORCINE) 5000 UNIT/ML IJ SOLN
5000.0000 [IU] | Freq: Three times a day (TID) | INTRAMUSCULAR | Status: DC
  Administered 2024-01-02 – 2024-01-05 (×9): 5000 [IU] via SUBCUTANEOUS
  Filled 2024-01-01 (×9): qty 1

## 2024-01-01 MED ORDER — ONDANSETRON HCL 4 MG/2ML IJ SOLN
INTRAMUSCULAR | Status: DC | PRN
  Administered 2024-01-01: 4 mg via INTRAVENOUS

## 2024-01-01 MED ORDER — FENTANYL CITRATE (PF) 250 MCG/5ML IJ SOLN
INTRAMUSCULAR | Status: AC
  Filled 2024-01-01: qty 5

## 2024-01-01 MED ORDER — CEFAZOLIN SODIUM-DEXTROSE 2-4 GM/100ML-% IV SOLN
2.0000 g | INTRAVENOUS | Status: AC
  Administered 2024-01-01: 2 g via INTRAVENOUS
  Filled 2024-01-01: qty 100

## 2024-01-01 MED ORDER — CHLORHEXIDINE GLUCONATE 4 % EX SOLN: 60.0000 mL | Freq: Once | CUTANEOUS | Status: DC

## 2024-01-01 MED ORDER — PHENYLEPHRINE HCL-NACL 20-0.9 MG/250ML-% IV SOLN
INTRAVENOUS | Status: AC
  Filled 2024-01-01: qty 250

## 2024-01-01 MED ORDER — PHENOL 1.4 % MT LIQD: 1.0000 | OROMUCOSAL | Status: DC | PRN

## 2024-01-01 MED ORDER — MORPHINE SULFATE (PF) 2 MG/ML IV SOLN
0.5000 mg | INTRAVENOUS | Status: DC | PRN
  Administered 2024-01-04 (×2): 1 mg via INTRAVENOUS
  Filled 2024-01-01 (×3): qty 1

## 2024-01-01 MED ORDER — ASPIRIN 81 MG PO TBEC
81.0000 mg | DELAYED_RELEASE_TABLET | Freq: Every day | ORAL | Status: DC
  Administered 2024-01-02 – 2024-01-05 (×4): 81 mg via ORAL
  Filled 2024-01-01 (×4): qty 1

## 2024-01-01 MED ORDER — FENTANYL CITRATE (PF) 100 MCG/2ML IJ SOLN
INTRAMUSCULAR | Status: AC
Start: 2024-01-01 — End: 2024-01-01
  Filled 2024-01-01: qty 2

## 2024-01-01 MED ORDER — ACETAMINOPHEN 10 MG/ML IV SOLN
INTRAVENOUS | Status: AC
  Filled 2024-01-01: qty 100

## 2024-01-01 MED ORDER — OXYCODONE HCL 5 MG/5ML PO SOLN
5.0000 mg | Freq: Once | ORAL | Status: AC | PRN
  Administered 2024-01-01: 5 mg via ORAL

## 2024-01-01 MED ORDER — ONDANSETRON HCL 4 MG/2ML IJ SOLN
INTRAMUSCULAR | Status: AC
  Filled 2024-01-01: qty 2

## 2024-01-01 MED ORDER — SENNOSIDES-DOCUSATE SODIUM 8.6-50 MG PO TABS: 1.0000 | ORAL_TABLET | Freq: Every evening | ORAL | Status: DC | PRN

## 2024-01-01 MED ORDER — CHLORHEXIDINE GLUCONATE 0.12 % MT SOLN: 15.0000 mL | Freq: Once | OROMUCOSAL | Status: AC

## 2024-01-01 MED ORDER — SODIUM CHLORIDE 0.9 % IV SOLN: INTRAVENOUS | Status: DC

## 2024-01-01 MED ORDER — ORAL CARE MOUTH RINSE
15.0000 mL | Freq: Once | OROMUCOSAL | Status: AC
Start: 2024-01-01 — End: 2024-01-01

## 2024-01-01 MED ORDER — VASHE WOUND IRRIGATION OPTIME
TOPICAL | Status: DC | PRN
  Administered 2024-01-01: 34 [oz_av]

## 2024-01-01 MED ORDER — EPHEDRINE SULFATE-NACL 50-0.9 MG/10ML-% IV SOSY
PREFILLED_SYRINGE | INTRAVENOUS | Status: DC | PRN
  Administered 2024-01-01: 7.5 mg via INTRAVENOUS

## 2024-01-01 MED ORDER — SODIUM CHLORIDE 0.9 % IV SOLN: 250.0000 mL | INTRAVENOUS | Status: AC | PRN

## 2024-01-01 MED ORDER — SODIUM CHLORIDE 0.9% FLUSH
3.0000 mL | Freq: Two times a day (BID) | INTRAVENOUS | Status: DC
  Administered 2024-01-02 – 2024-01-04 (×5): 3 mL via INTRAVENOUS

## 2024-01-01 MED ORDER — METOPROLOL TARTRATE 5 MG/5ML IV SOLN: 2.0000 mg | INTRAVENOUS | Status: DC | PRN

## 2024-01-01 MED ORDER — LIDOCAINE 2% (20 MG/ML) 5 ML SYRINGE
INTRAMUSCULAR | Status: AC
  Filled 2024-01-01: qty 5

## 2024-01-01 MED ORDER — SODIUM CHLORIDE 0.9% FLUSH: 3.0000 mL | INTRAVENOUS | Status: DC | PRN

## 2024-01-01 MED ORDER — DEXAMETHASONE SODIUM PHOSPHATE 10 MG/ML IJ SOLN
INTRAMUSCULAR | Status: AC
  Filled 2024-01-01: qty 1

## 2024-01-01 MED ORDER — OXYCODONE-ACETAMINOPHEN 5-325 MG PO TABS
2.0000 | ORAL_TABLET | Freq: Four times a day (QID) | ORAL | Status: DC | PRN
  Administered 2024-01-01 – 2024-01-04 (×6): 2 via ORAL
  Filled 2024-01-01 (×7): qty 2

## 2024-01-01 MED ORDER — ATORVASTATIN CALCIUM 80 MG PO TABS
80.0000 mg | ORAL_TABLET | Freq: Every day | ORAL | Status: DC
  Administered 2024-01-01 – 2024-01-04 (×4): 80 mg via ORAL
  Filled 2024-01-01 (×4): qty 1

## 2024-01-01 MED ORDER — TAMSULOSIN HCL 0.4 MG PO CAPS
0.4000 mg | ORAL_CAPSULE | Freq: Every morning | ORAL | Status: DC
  Administered 2024-01-02 – 2024-01-05 (×4): 0.4 mg via ORAL
  Filled 2024-01-01 (×4): qty 1

## 2024-01-01 MED ORDER — CHLORHEXIDINE GLUCONATE 0.12 % MT SOLN
OROMUCOSAL | Status: AC
  Administered 2024-01-01: 15 mL via OROMUCOSAL
  Filled 2024-01-01: qty 15

## 2024-01-01 MED ORDER — PHENYLEPHRINE HCL (PRESSORS) 10 MG/ML IV SOLN
INTRAVENOUS | Status: AC
  Filled 2024-01-01: qty 1

## 2024-01-01 MED ORDER — ACETAMINOPHEN 10 MG/ML IV SOLN
1000.0000 mg | Freq: Once | INTRAVENOUS | Status: DC | PRN
  Administered 2024-01-01: 1000 mg via INTRAVENOUS

## 2024-01-01 MED ORDER — ACETAMINOPHEN 500 MG PO TABS
500.0000 mg | ORAL_TABLET | Freq: Four times a day (QID) | ORAL | Status: AC
  Administered 2024-01-01 – 2024-01-02 (×3): 500 mg via ORAL
  Filled 2024-01-01 (×3): qty 1

## 2024-01-01 MED ORDER — PROPOFOL 10 MG/ML IV BOLUS
INTRAVENOUS | Status: DC | PRN
  Administered 2024-01-01: 20 mg via INTRAVENOUS
  Administered 2024-01-01: 100 mg via INTRAVENOUS

## 2024-01-01 MED ORDER — FENTANYL CITRATE (PF) 250 MCG/5ML IJ SOLN
INTRAMUSCULAR | Status: DC | PRN
  Administered 2024-01-01 (×2): 50 ug via INTRAVENOUS

## 2024-01-01 MED ORDER — POTASSIUM CHLORIDE CRYS ER 20 MEQ PO TBCR: 40.0000 meq | EXTENDED_RELEASE_TABLET | Freq: Every day | ORAL | Status: DC | PRN

## 2024-01-01 MED ORDER — SODIUM CHLORIDE 0.9 % IR SOLN
Status: DC | PRN
  Administered 2024-01-01: 3000 mL

## 2024-01-01 MED ORDER — LABETALOL HCL 5 MG/ML IV SOLN: 10.0000 mg | INTRAVENOUS | Status: DC | PRN

## 2024-01-01 MED ORDER — CEPHALEXIN 500 MG PO CAPS
500.0000 mg | ORAL_CAPSULE | Freq: Four times a day (QID) | ORAL | Status: DC
  Administered 2024-01-01 – 2024-01-02 (×6): 500 mg via ORAL
  Filled 2024-01-01 (×6): qty 1

## 2024-01-01 MED ORDER — PHENYLEPHRINE 80 MCG/ML (10ML) SYRINGE FOR IV PUSH (FOR BLOOD PRESSURE SUPPORT)
PREFILLED_SYRINGE | INTRAVENOUS | Status: DC | PRN
  Administered 2024-01-01 (×2): 80 ug via INTRAVENOUS

## 2024-01-01 MED ORDER — ACETAMINOPHEN 325 MG PO TABS: 325.0000 mg | ORAL_TABLET | Freq: Four times a day (QID) | ORAL | Status: DC | PRN

## 2024-01-01 MED ORDER — ONDANSETRON HCL 4 MG/2ML IJ SOLN: 4.0000 mg | Freq: Four times a day (QID) | INTRAMUSCULAR | Status: DC | PRN

## 2024-01-01 MED ORDER — 0.9 % SODIUM CHLORIDE (POUR BTL) OPTIME
TOPICAL | Status: DC | PRN
  Administered 2024-01-01: 1000 mL

## 2024-01-01 MED ORDER — FLEET ENEMA RE ENEM: 1.0000 | ENEMA | Freq: Once | RECTAL | Status: DC | PRN

## 2024-01-01 MED ORDER — DOXYCYCLINE HYCLATE 100 MG PO TABS
100.0000 mg | ORAL_TABLET | Freq: Two times a day (BID) | ORAL | Status: DC
  Administered 2024-01-01 – 2024-01-02 (×3): 100 mg via ORAL
  Filled 2024-01-01 (×3): qty 1

## 2024-01-01 MED ORDER — DOCUSATE SODIUM 100 MG PO CAPS
100.0000 mg | ORAL_CAPSULE | Freq: Every day | ORAL | Status: DC
  Administered 2024-01-02 – 2024-01-05 (×4): 100 mg via ORAL
  Filled 2024-01-01 (×4): qty 1

## 2024-01-01 MED ORDER — OXYCODONE HCL 5 MG PO TABS: 5.0000 mg | ORAL_TABLET | Freq: Once | ORAL | Status: AC | PRN

## 2024-01-01 MED ORDER — DEXAMETHASONE SODIUM PHOSPHATE 10 MG/ML IJ SOLN
INTRAMUSCULAR | Status: DC | PRN
  Administered 2024-01-01: 10 mg via INTRAVENOUS

## 2024-01-01 MED ORDER — HYDRALAZINE HCL 20 MG/ML IJ SOLN: 5.0000 mg | INTRAMUSCULAR | Status: DC | PRN

## 2024-01-01 MED ORDER — ZOLPIDEM TARTRATE 5 MG PO TABS
5.0000 mg | ORAL_TABLET | Freq: Once | ORAL | Status: AC
  Administered 2024-01-02: 5 mg via ORAL
  Filled 2024-01-01: qty 1

## 2024-01-01 MED ORDER — BISACODYL 5 MG PO TBEC: 5.0000 mg | DELAYED_RELEASE_TABLET | Freq: Every day | ORAL | Status: DC | PRN

## 2024-01-01 MED ORDER — BUPROPION HCL ER (XL) 150 MG PO TB24
150.0000 mg | ORAL_TABLET | Freq: Every morning | ORAL | Status: DC
Start: 2024-01-02 — End: 2024-01-05
  Administered 2024-01-02 – 2024-01-05 (×4): 150 mg via ORAL
  Filled 2024-01-01 (×4): qty 1

## 2024-01-01 MED ORDER — OXYCODONE HCL 5 MG/5ML PO SOLN
ORAL | Status: AC
  Filled 2024-01-01: qty 5

## 2024-01-01 SURGICAL SUPPLY — 36 items
BAG COUNTER SPONGE SURGICOUNT (BAG) ×2 IMPLANT
BLADE MIC 41X13 (BLADE) IMPLANT
BNDG ELASTIC 4X5.8 VLCR STR LF (GAUZE/BANDAGES/DRESSINGS) IMPLANT
BNDG ELASTIC 6INX 5YD STR LF (GAUZE/BANDAGES/DRESSINGS) IMPLANT
BNDG GAUZE DERMACEA FLUFF 4 (GAUZE/BANDAGES/DRESSINGS) IMPLANT
CANISTER SUCTION 3000ML PPV (SUCTIONS) ×2 IMPLANT
CANISTER WOUNDNEG PRESSURE 500 (CANNISTER) IMPLANT
CLIP TI MEDIUM 6 (CLIP) ×2 IMPLANT
CLIP TI WIDE RED SMALL 6 (CLIP) ×2 IMPLANT
COVER SURGICAL LIGHT HANDLE (MISCELLANEOUS) ×2 IMPLANT
DRAPE HALF SHEET 40X57 (DRAPES) IMPLANT
DRAPE U-SHAPE 76X120 STRL (DRAPES) IMPLANT
DRSG VAC GRANUFOAM MED (GAUZE/BANDAGES/DRESSINGS) IMPLANT
ELECTRODE REM PT RTRN 9FT ADLT (ELECTROSURGICAL) ×2 IMPLANT
GAUZE SPONGE 4X4 12PLY STRL (GAUZE/BANDAGES/DRESSINGS) ×2 IMPLANT
GAUZE XEROFORM 5X9 LF (GAUZE/BANDAGES/DRESSINGS) IMPLANT
GLOVE SURG SS PI 7.5 STRL IVOR (GLOVE) ×6 IMPLANT
GOWN STRL REUS W/ TWL LRG LVL3 (GOWN DISPOSABLE) ×4 IMPLANT
GOWN STRL REUS W/ TWL XL LVL3 (GOWN DISPOSABLE) ×2 IMPLANT
GRAFT SKIN WND MICRO 38 (Tissue) IMPLANT
IV NS IRRIG 3000ML ARTHROMATIC (IV SOLUTION) ×2 IMPLANT
KIT BASIN OR (CUSTOM PROCEDURE TRAY) ×2 IMPLANT
KIT TURNOVER KIT B (KITS) ×2 IMPLANT
NS IRRIG 1000ML POUR BTL (IV SOLUTION) ×2 IMPLANT
PACK CV ACCESS (CUSTOM PROCEDURE TRAY) IMPLANT
PACK GENERAL/GYN (CUSTOM PROCEDURE TRAY) ×2 IMPLANT
PACK UNIVERSAL I (CUSTOM PROCEDURE TRAY) ×2 IMPLANT
PAD ARMBOARD POSITIONER FOAM (MISCELLANEOUS) ×4 IMPLANT
SET HNDPC FAN SPRY TIP SCT (DISPOSABLE) IMPLANT
SUT ETHILON 3 0 PS 1 (SUTURE) IMPLANT
SUT VIC AB 2-0 CTX 36 (SUTURE) IMPLANT
SUT VIC AB 3-0 SH 27X BRD (SUTURE) IMPLANT
SUT VIC AB 4-0 PS2 18 (SUTURE) IMPLANT
TIP FAN IRRIG PULSAVAC PLUS (DISPOSABLE) IMPLANT
TOWEL GREEN STERILE (TOWEL DISPOSABLE) ×2 IMPLANT
WATER STERILE IRR 1000ML POUR (IV SOLUTION) ×2 IMPLANT

## 2024-01-01 NOTE — Anesthesia Procedure Notes (Signed)
 Procedure Name: LMA Insertion Date/Time: 01/01/2024 1:37 PM  Performed by: Sharie Joesph BRAVO, RNPre-anesthesia Checklist: Patient identified, Emergency Drugs available, Suction available and Patient being monitored Patient Re-evaluated:Patient Re-evaluated prior to induction Oxygen Delivery Method: Circle system utilized Preoxygenation: Pre-oxygenation with 100% oxygen Induction Type: IV induction LMA: LMA inserted LMA Size: 4.0 Tube type: Oral Number of attempts: 1 Placement Confirmation: positive ETCO2 and breath sounds checked- equal and bilateral Tube secured with: Tape Dental Injury: Teeth and Oropharynx as per pre-operative assessment

## 2024-01-01 NOTE — Transfer of Care (Signed)
 Immediate Anesthesia Transfer of Care Note  Patient: [name] [name]  Procedure(s) Performed: IRRIGATION AND DEBRIDEMENT WOUND (Left) APPLICATION, WOUND VAC AND KERECIS WOUND MATRIX (Left: Leg Lower)  Patient Location: PACU  Anesthesia Type:General  Level of Consciousness: awake, alert , and oriented  Airway & Oxygen Therapy: Patient Spontanous Breathing and Patient connected to face mask oxygen  Post-op Assessment: Report given to RN and Post -op Vital signs reviewed and stable  Post vital signs: Reviewed and stable  Last Vitals:  Vitals Value Taken Time  BP 155/68 01/01/24 14:47  Temp    Pulse 83 01/01/24 14:51  Resp 17 01/01/24 14:51  SpO2 100 % 01/01/24 14:51  Vitals shown include unfiled device data.  Last Pain:  Vitals:   01/01/24 1212  TempSrc:   PainSc: 0-No pain         Complications: No notable events documented.

## 2024-01-01 NOTE — Plan of Care (Signed)
  Problem: Education: Goal: Knowledge of General Education information will improve Description: Including pain rating scale, medication(s)/side effects and non-pharmacologic comfort measures Outcome: Progressing   Problem: Health Behavior/Discharge Planning: Goal: Ability to manage health-related needs will improve Outcome: Progressing   Problem: Clinical Measurements: Goal: Ability to maintain clinical measurements within normal limits will improve Outcome: Progressing Goal: Will remain free from infection Outcome: Progressing Goal: Diagnostic test results will improve Outcome: Progressing Goal: Respiratory complications will improve Outcome: Progressing Goal: Cardiovascular complication will be avoided Outcome: Progressing   Problem: Activity: Goal: Risk for activity intolerance will decrease Outcome: Progressing   Problem: Nutrition: Goal: Adequate nutrition will be maintained Outcome: Progressing   Problem: Coping: Goal: Level of anxiety will decrease Outcome: Progressing   Problem: Elimination: Goal: Will not experience complications related to bowel motility Outcome: Progressing Goal: Will not experience complications related to urinary retention Outcome: Progressing   Problem: Pain Managment: Goal: General experience of comfort will improve and/or be controlled Outcome: Progressing   Problem: Safety: Goal: Ability to remain free from injury will improve Outcome: Progressing   Problem: Skin Integrity: Goal: Risk for impaired skin integrity will decrease Outcome: Progressing   Problem: Education: Goal: Knowledge of the prescribed therapeutic regimen will improve Outcome: Progressing Goal: Ability to verbalize activity precautions or restrictions will improve Outcome: Progressing Goal: Understanding of discharge needs will improve Outcome: Progressing   Problem: Activity: Goal: Ability to perform//tolerate increased activity and mobilize with assistive  devices will improve Outcome: Progressing   Problem: Clinical Measurements: Goal: Postoperative complications will be avoided or minimized Outcome: Progressing   Problem: Self-Care: Goal: Ability to meet self-care needs will improve Outcome: Progressing   Problem: Self-Concept: Goal: Ability to maintain and perform role responsibilities to the fullest extent possible will improve Outcome: Progressing   Problem: Pain Management: Goal: Pain level will decrease with appropriate interventions Outcome: Progressing   Problem: Skin Integrity: Goal: Demonstration of wound healing without infection will improve Outcome: Progressing

## 2024-01-01 NOTE — Interval H&P Note (Signed)
 History and Physical Interval Note:  01/01/2024 11:36 AM  [name] [name]  has presented today for surgery, with the diagnosis of non healing left bka.  The various methods of treatment have been discussed with the patient and family. After consideration of risks, benefits and other options for treatment, the patient has consented to  Procedure(s): IRRIGATION AND DEBRIDEMENT WOUND (Left) as a surgical intervention.  The patient's history has been reviewed, patient examined, no change in status, stable for surgery.  I have reviewed the patient's chart and labs.  Questions were answered to the patient's satisfaction.     Malvina New

## 2024-01-01 NOTE — Anesthesia Postprocedure Evaluation (Signed)
 Anesthesia Post Note  Patient: [name] [name]  Procedure(s) Performed: IRRIGATION AND DEBRIDEMENT WOUND (Left) APPLICATION, WOUND VAC AND KERECIS WOUND MATRIX (Left: Leg Lower)     Patient location during evaluation: PACU Anesthesia Type: General Level of consciousness: awake and alert Pain management: pain level controlled Vital Signs Assessment: post-procedure vital signs reviewed and stable Respiratory status: spontaneous breathing, nonlabored ventilation, respiratory function stable and patient connected to nasal cannula oxygen Cardiovascular status: blood pressure returned to baseline and stable Postop Assessment: no apparent nausea or vomiting Anesthetic complications: no   No notable events documented.  Last Vitals:  Vitals:   01/01/24 1530 01/01/24 1551  BP: (!) 102/54 139/63  Pulse: 73 73  Resp: 14 18  Temp: 36.5 C 36.6 C  SpO2: 98% 94%    Last Pain:  Vitals:   01/01/24 1515  TempSrc:   PainSc: 5                  Thom JONELLE Peoples

## 2024-01-02 ENCOUNTER — Encounter (HOSPITAL_COMMUNITY): Payer: Self-pay | Admitting: Surgery

## 2024-01-02 DIAGNOSIS — Z9889 Other specified postprocedural states: Secondary | ICD-10-CM

## 2024-01-02 LAB — CBC
HCT: 31.4 % — ABNORMAL LOW (ref 39.0–52.0)
Hemoglobin: 10.9 g/dL — ABNORMAL LOW (ref 13.0–17.0)
MCH: 30.3 pg (ref 26.0–34.0)
MCHC: 34.7 g/dL (ref 30.0–36.0)
MCV: 87.2 fL (ref 80.0–100.0)
Platelets: 279 K/uL (ref 150–400)
RBC: 3.6 MIL/uL — ABNORMAL LOW (ref 4.22–5.81)
RDW: 11.9 % (ref 11.5–15.5)
WBC: 11.6 K/uL — ABNORMAL HIGH (ref 4.0–10.5)
nRBC: 0 % (ref 0.0–0.2)

## 2024-01-02 LAB — BASIC METABOLIC PANEL WITH GFR
Anion gap: 7 (ref 5–15)
BUN: 23 mg/dL (ref 8–23)
CO2: 26 mmol/L (ref 22–32)
Calcium: 8.4 mg/dL — ABNORMAL LOW (ref 8.9–10.3)
Chloride: 103 mmol/L (ref 98–111)
Creatinine, Ser: 0.79 mg/dL (ref 0.61–1.24)
GFR, Estimated: >60 mL/min (ref >60)
Glucose, Bld: 174 mg/dL — ABNORMAL HIGH (ref 70–99)
Potassium: 4.2 mmol/L (ref 3.5–5.1)
Sodium: 136 mmol/L (ref 135–145)

## 2024-01-02 MED ORDER — VANCOMYCIN HCL IN DEXTROSE 1-5 GM/200ML-% IV SOLN: 1000.0000 mg | Freq: Once | INTRAVENOUS | Status: DC

## 2024-01-02 MED ORDER — ZOLPIDEM TARTRATE 5 MG PO TABS: 5.0000 mg | ORAL_TABLET | Freq: Every evening | ORAL | Status: DC | PRN

## 2024-01-02 MED ORDER — VANCOMYCIN HCL IN DEXTROSE 1-5 GM/200ML-% IV SOLN
1000.0000 mg | Freq: Two times a day (BID) | INTRAVENOUS | Status: DC
  Administered 2024-01-02 – 2024-01-04 (×6): 1000 mg via INTRAVENOUS
  Filled 2024-01-02 (×6): qty 200

## 2024-01-02 MED ORDER — MELATONIN 3 MG PO TABS
3.0000 mg | ORAL_TABLET | Freq: Every evening | ORAL | Status: DC | PRN
  Administered 2024-01-02: 3 mg via ORAL
  Filled 2024-01-02: qty 1

## 2024-01-02 NOTE — Progress Notes (Signed)
 OT Cancellation Note and OT Screen  Patient Details Name: [name] [name] MRN: [id number] DOB: 20-Dec-1945   Cancelled Treatment:    Reason Eval/Treat Not Completed: OT screened, no needs identified, will sign off (Pt currently near baseline, performing ADLs Ind to Sup and transfers with a RW with Sup. No further beneft from acute OT at this time. Conitnue HH OT for safety in the home. Pt with possible L AKA on 7/11, please re-consult as appropriate post sx.)  Margarie Rockey HERO., OTR/L, MA Acute Rehab [phone]   Margarie FORBES Horns 01/02/2024, 10:38 AM

## 2024-01-02 NOTE — Progress Notes (Signed)
 Verbal order pre MD Gaile New  Percocet 5-325mg  2 tablets Q 6 hrs PRN for pain Ambien  5mg  once for sleep

## 2024-01-02 NOTE — Discharge Instructions (Addendum)
[facility] [place]  Discharge instructions  Lower Extremity Amputation  Please refer to the following instruction for your post-procedure care. Your surgeon or physician assistant will discuss any changes with you.  Activity  You are encouraged to walk as much as you can. You can slowly return to normal activities during the month after your surgery. Avoid strenuous activity and heavy lifting until your doctor tells you it's OK. Avoid activities such as vacuuming or swinging a golf club. Do not drive until your doctor give the OK and you are no longer taking prescription pain medications. It is also normal to have difficulty with sleep habits, eating and bowel movement after surgery. These will go away with time.  Bathing/Showering  Shower daily after you go home. Do not soak in a bathtub, hot tub, or swim until the incision heals completely.  Incision Care  Clean your incision with mild soap and water. Shower every day. Pat the area dry with a clean towel. You do not need a bandage unless otherwise instructed. Do not apply any ointments or creams to your incision. If you have open wounds you will be instructed how to care for them or a visiting nurse may be arranged for you. If you have staples or sutures along your incision they will be removed at your post-op appointment. You may have skin glue on your incision. Do not peel it off. It will come off on its own in about one week.  Diet  Resume your normal diet. There are no special food restrictions following this procedure. A low fat/ low cholesterol diet is recommended for all patients with vascular disease. In order to heal from your surgery, it is CRITICAL to get adequate nutrition. Your body requires vitamins, minerals, and protein. Vegetables are the best source of vitamins and minerals. Vegetables also provide the perfect balance of protein. Processed food has little nutritional value, so try to avoid  this.  Medications  Resume taking all your medications unless your doctor or physician assistant tells you not to. If your incision is causing pain, you may take over-the-counter pain relievers such as acetaminophen (Tylenol). If you were prescribed a stronger pain medication, please aware these medication can cause nausea and constipation. Prevent nausea by taking the medication with a snack or meal. Avoid constipation by drinking plenty of fluids and eating foods with high amount of fiber, such as fruits, vegetables, and grains. Take Colace 100 mg (an over-the-counter stool softener) twice a day as needed for constipation.  Do not take Tylenol if you are taking prescription pain medications.  Follow Up  Our office will schedule a follow up appointment 4 weeks following discharge.  Please call us immediately for any of the following conditions  Increase pain, redness, warmth, or drainage (pus) from your incision site(s) Fever of 101 degree or higher The swelling in your leg with the amputation suddenly worsens and becomes more painful than when you were in the hospital  Leg swelling is common after amputation surgery.  The swelling should improve over a few months following surgery. To improve the swelling, you may elevate your legs above the level of your heart while you are sitting or resting. Your surgeon or physician assistant may ask you to apply an ACE wrap or wear compression (TED) stockings to help to reduce swelling.  Reduce your risk of vascular disease  Stop smoking. If you would like help call [facility] at 1-800-QUIT-NOW (1-800-784-8669) or [place] at 336-586-4000.  Manage   your cholesterol Maintain a desired weight Control your diabetes weight Control your diabetes Keep your blood pressure down  If you have any questions, please call the office at [phone] 

## 2024-01-02 NOTE — Evaluation (Signed)
 Physical Therapy Evaluation Patient Details Name: [name] [name] MRN: [id number] DOB: 07-31-1945 Today's Date: 01/02/2024  History of Present Illness  [name] [name] is a 78 y.o. 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 Pt s/p I&D to L wounds 7/8. PMHx: HTN, HLD, depression/anxiety, CAD s/p PCI x4, PTSD, CVA w/residual L weakness, agent orange exposure, and OSA.   Clinical Impression  Pt admitted with above diagnosis. PTA, pt was modI for functional mobility using a walker and transport chair. He is modI for ADLs and relies on his wife for IADLs. He lives with his wife in a one story house with a ramp entrance and reports it is handicap accessible. Pt currently with functional limitations due to the deficits listed below (see PT Problem List). He performed bed mobility with modI, transfers using RW with supervision, and gait using RW with supervision. Cues for increased safety awareness given LLE wound vac. Pt slightly unsteady given the extra weight the wound vac adds to the RW, but no overt LOB. Pt will benefit from acute skilled PT to increase his independence and safety with mobility to allow discharge home with continued HHPT.       If plan is discharge home, recommend the following: A little help with walking and/or transfers;Assist for transportation;Help with stairs or ramp for entrance   Can travel by private vehicle        Equipment Recommendations Rolling walker (2 wheels)  Recommendations for Other Services       Functional Status Assessment Patient has had a recent decline in their functional status and demonstrates the ability to make significant improvements in function in a reasonable and predictable amount of time.     Precautions / Restrictions Precautions Precautions: Fall Recall of Precautions/Restrictions: Intact Precaution/Restrictions Comments: LLE wound vac Restrictions Weight Bearing Restrictions Per Provider Order: No      Mobility   Bed Mobility Overal bed mobility: Modified Independent             General bed mobility comments: Pt sat up on R side of bed with HOB elevated to 40deg.    Transfers Overall transfer level: Needs assistance Equipment used: Rolling walker (2 wheels) Transfers: Sit to/from Stand, Bed to chair/wheelchair/BSC Sit to Stand: Supervision   Step pivot transfers: Supervision       General transfer comment: Pt stood from lowest bed height. He demonstrated proper hand placement using RW. Powered up without physical assist. Cues for manuever RW given weight of wound vac. Good eccentric control with sitting.    Ambulation/Gait Ambulation/Gait assistance: Supervision Gait Distance (Feet): 30 Feet Assistive device: Rolling walker (2 wheels) Gait Pattern/deviations: Step-to pattern, Decreased step length - right, Decreased stride length Gait velocity: reduced Gait velocity interpretation: <1.8 ft/sec, indicate of risk for recurrent falls   General Gait Details: Pt ambulated with a hopping technique. He advanced RLE by increasing WBing into BUE support on RW. Pt mantained upright posture. He achieved good foot clearence. No LOB. Cues for manuevering RW given the wound vac and to improve safety awareness.  Stairs            Wheelchair Mobility     Tilt Bed    Modified Rankin (Stroke Patients Only)       Balance Overall balance assessment: Mild deficits observed, not formally tested  Pertinent Vitals/Pain Pain Assessment Pain Assessment: 0-10 Pain Score: 5  Pain Location: LLE Pain Descriptors / Indicators: Discomfort, Aching Pain Intervention(s): Monitored during session    Home Living Family/patient expects to be discharged to:: Private residence Living Arrangements: Spouse/significant other Available Help at Discharge: Family;Available 24 hours/day Type of Home: House Home Access: Ramped entrance Entrance  Stairs-Rails: None     Home Layout: One level Home Equipment: Rollator (4 wheels);Cane - single point;Shower seat;Electric scooter;Wheelchair - manual;Grab bars - toilet;Grab bars - tub/shower;Hand held shower head;Transport chair Additional Comments: Pt reports he is currently recieving therapy services 2x/week.    Prior Function Prior Level of Function : Independent/Modified Independent             Mobility Comments: Ambulates using transport chair, propelling himself. Pt reports occasionally using walker for transfers. Denies falls in the last 23mo. ADLs Comments: ModI ADLs. Wife manages his medications and handles household responsibilites.     Extremity/Trunk Assessment   Upper Extremity Assessment Upper Extremity Assessment: Overall WFL for tasks assessed    Lower Extremity Assessment Lower Extremity Assessment: Overall WFL for tasks assessed    Cervical / Trunk Assessment Cervical / Trunk Assessment: Normal  Communication   Communication Communication: Impaired Factors Affecting Communication: Hearing impaired    Cognition Arousal: Alert Behavior During Therapy: WFL for tasks assessed/performed   PT - Cognitive impairments: No apparent impairments                       PT - Cognition Comments: Pt A,Ox4 Following commands: Intact       Cueing Cueing Techniques: Verbal cues     General Comments      Exercises     Assessment/Plan    PT Assessment Patient needs continued PT services  PT Problem List Decreased activity tolerance;Decreased balance;Decreased mobility;Decreased safety awareness       PT Treatment Interventions DME instruction;Gait training;Functional mobility training;Therapeutic activities;Therapeutic exercise;Balance training;Patient/family education    PT Goals (Current goals can be found in the Care Plan section)  Acute Rehab PT Goals Patient Stated Goal: Return Home PT Goal Formulation: With patient Time For Goal  Achievement: 01/16/24 Potential to Achieve Goals: Good    Frequency Min 2X/week     Co-evaluation               AM-PAC PT 6 Clicks Mobility  Outcome Measure Help needed turning from your back to your side while in a flat bed without using bedrails?: None Help needed moving from lying on your back to sitting on the side of a flat bed without using bedrails?: None Help needed moving to and from a bed to a chair (including a wheelchair)?: A Little Help needed standing up from a chair using your arms (e.g., wheelchair or bedside chair)?: A Little Help needed to walk in hospital room?: A Little Help needed climbing 3-5 steps with a railing? : A Lot 6 Click Score: 19    End of Session Equipment Utilized During Treatment: Gait belt Activity Tolerance: Patient tolerated treatment well Patient left: in chair;with call bell/phone within reach;with chair alarm set Nurse Communication: Mobility status PT Visit Diagnosis: Difficulty in walking, not elsewhere classified (R26.2);Unsteadiness on feet (R26.81)    Time: 9148-9082 PT Time Calculation (min) (ACUTE ONLY): 26 min   Charges:   PT Evaluation $PT Eval Low Complexity: 1 Low PT Treatments $Gait Training: 8-22 mins PT General Charges $$ ACUTE PT VISIT: 1 Visit  Randall SAUNDERS, PT, DPT Acute Rehabilitation Services Office: [phone] Secure Chat Preferred  Delon CHRISTELLA Callander 01/02/2024, 10:17 AM

## 2024-01-02 NOTE — Progress Notes (Addendum)
  Progress Note    01/02/2024 6:46 AM 1 Day Post-Op  Subjective:  he says he is not having any pain this morning.    Afebrile HR 70's-80's 110's systolic 98% RA  Vitals:   01/01/24 2010 01/02/24 0506  BP: 118/64 118/71  Pulse: 82 73  Resp: 18 18  Temp: 97.9 F (36.6 C) 98 F (36.7 C)  SpO2: 95% 98%    Physical Exam: General:  no distress; resting comfortably Cardiac:  regular Lungs:  non labored Incisions:  left BKA with wound vac in place with good seal Extremities:  palpable right DP pulse   CBC    Component Value Date/Time   WBC 11.6 (H) 01/02/2024 0527   RBC 3.60 (L) 01/02/2024 0527   HGB 10.9 (L) 01/02/2024 0527   HCT 31.4 (L) 01/02/2024 0527   PLT 279 01/02/2024 0527   MCV 87.2 01/02/2024 0527   MCH 30.3 01/02/2024 0527   MCHC 34.7 01/02/2024 0527   RDW 11.9 01/02/2024 0527   LYMPHSABS 2.3 12/03/2023 0512   MONOABS 0.8 12/03/2023 0512   EOSABS 0.2 12/03/2023 0512   BASOSABS 0.0 12/03/2023 0512    BMET    Component Value Date/Time   NA 136 01/02/2024 0527   K 4.2 01/02/2024 0527   CL 103 01/02/2024 0527   CO2 26 01/02/2024 0527   GLUCOSE 174 (H) 01/02/2024 0527   BUN 23 01/02/2024 0527   CREATININE 0.79 01/02/2024 0527   CALCIUM  8.4 (L) 01/02/2024 0527   GFRNONAA >60 01/02/2024 0527   GFRAA >60 07/31/2019 1241    INR    Component Value Date/Time   INR 1.1 11/13/2023 1100     Intake/Output Summary (Last 24 hours) at 01/02/2024 0646 Last data filed at 01/02/2024 0600 Gross per 24 hour  Intake 280 ml  Output 625 ml  Net -345 ml      Component Ref Range & Units (hover) 1 d ago  Specimen Description WOUND LEFT LOWER LEG  Special Requests NONE  Gram Stain NO WBC SEEN MODERATE GRAM POSITIVE COCCI Performed at [facility], [address], [place], [place] [address]  Culture PENDING  Report Status PENDING       Assessment/Plan:  78 y.o. male is s/p:  Debridement with wound vac placement left BKA stump 01/01/2024 by Dr.  Serene 1 Day Post-Op   -wound vac with good seal -gram stain with moderate GPC -currently on Keflex  and Doxycycline .  Will await final sensitivities.  -plan per Dr. Serene -re-ordered Melatonin -ordered regular diet. -DVT prophylaxis:  sq heparin    Lucie Apt, PA-C [facility] [phone] 01/02/2024 6:46 AM   Discussed OR findings.  Will plan for I&D vs AKA on Friday -change to reg diet -Add vanc for GPC -tylenol  PM vs ambien  for sleep  Malvina Serene

## 2024-01-02 NOTE — Op Note (Signed)
    Patient name: [name] [name] MRN: [id number] DOB: February 10, 1946 Sex: male  01/01/2024 Pre-operative Diagnosis: non-healing left BKA Post-operative diagnosis:  Same Surgeon:  Malvina New Assistants:  KYM Damme Procedure:   #1: I&D left BKA, including skin, soft tissue, muscle, and fascia   #2: Placement of first 38 cm skin substitute (Kerecis)   #3: Wound VAC placement Anesthesia:  General Blood Loss:  minimal Specimens:  cultures sent  Findings: Abscess identified in the anterior portion of the wound.  Large amounts of necrotic muscle was completely excised, leaving some marginal muscle.  Wound VAC and Kerecis were placed  Indications: This is a 78 year old gentleman who underwent a left below-knee amputation for pain management.  He has developed a skin separation on the anterior aspect of the incision with erythema and edema.  He comes in today for surgical exploration  Procedure:  The patient was identified in the holding area and taken to [facility] OR ROOM 11  The patient was then placed supine on the table. general anesthesia was administered.  The patient was prepped and draped in the usual sterile fashion.  A time out was called and antibiotics were administered.  An experienced assistant was necessary to expedite the procedure.  She helped with exposure, debridement, and wound VAC application  I initially began by probing the wound with a Q-tip and immediately encountered purulent drainage which was sent for culture.  I elected to remove all of the staples and then opened the entire incision for better exposure.  The bone was protected with overlying tissue.  However, there was the majority of muscle that did not appear to be viable.  I resected a large amount of the muscle that was nonviable.  There was some muscle that was marginal and so I elected to leave this.  I irrigated the wound with 3 L of normal saline.  Kerecis was placed over top of the wound followed by black sponge wound VAC.   The patient will return in a few days to determine whether or not this needs to be converted to an above-knee amputation.   Disposition: To PACU stable.   ALONSO Malvina New, M.D., [facility] [place] Office: [phone] Pager:  [phone]

## 2024-01-02 NOTE — Progress Notes (Signed)
 Inpatient Rehab Admissions Coordinator:   Note therapy recommendations for [facility] from PT.  Will sign off for CIR.   Reche Lowers, PT, DPT Admissions Coordinator [phone] 01/02/24  11:24 AM

## 2024-01-02 NOTE — Progress Notes (Signed)
 Pharmacy Antibiotic Note  [name] [name] is a 78 y.o. male admitted on 01/01/2024 with wound infection.  Pharmacy has been consulted for vanco dosing.  Plan: Vanco 1 gram iv q12h eAUC 458, scr 0.8, Vd 0.72 IBW  Height: 5' 9 (175.3 cm) Weight: 91.2 kg (201 lb) IBW/kg (Calculated) : 70.7  Temp (24hrs), Avg:98.1 F (36.7 C), Min:97.7 F (36.5 C), Max:98.5 F (36.9 C)  Recent Labs  Lab 01/01/24 1212 01/01/24 1756 01/02/24 0527  WBC  --  10.5 11.6*  CREATININE 0.70 0.88 0.79    Estimated Creatinine Clearance: 83.6 mL/min (by C-G formula based on SCr of 0.79 mg/dL).    Allergies  Allergen Reactions   Dilaudid [Hydromorphone Hcl] Other (See Comments)    CRASHES per patient Talked to nurse and she said pt is afraid to take dilaudid because pt states he flat-lined when they gave it to him.SABRASABRAPt doesn't mind taking Percocet    Clindamycin/Lincomycin Other (See Comments)    Blisters in mouth   Oxcarbazepine Other (See Comments)    Other reaction(s): Delirium    Antimicrobials this admission: Vanco  7/9 >>     Microbiology results: 7/8 WCx: GPC   Thank you for allowing pharmacy to be a part of this patient's care.    Benedetta Heath BS, PharmD, BCPS Clinical Pharmacist 01/02/2024 11:13 AM  Contact: [phone] after 3 PM  Be curious, not judgmental... -Davina Sprinkles

## 2024-01-02 NOTE — Progress Notes (Signed)
 Inpatient Rehab Admissions Coordinator:   Post op amputation consult received and chart reviewed.  Therapy evals pending.  Note admitted for I&D of L BKA incision.  Will require approval from [place] for potential admit to CIR.  Will watch for therapy recommendations.    Reche Lowers, PT, DPT Admissions Coordinator [phone] 01/02/24  8:17 AM

## 2024-01-03 DIAGNOSIS — B9621 Shiga toxin-producing Escherichia coli [E. coli] (STEC) O157 as the cause of diseases classified elsewhere: Secondary | ICD-10-CM

## 2024-01-03 LAB — BASIC METABOLIC PANEL WITH GFR
Anion gap: 6 (ref 5–15)
BUN: 17 mg/dL (ref 8–23)
CO2: 28 mmol/L (ref 22–32)
Calcium: 8.9 mg/dL (ref 8.9–10.3)
Chloride: 107 mmol/L (ref 98–111)
Creatinine, Ser: 0.76 mg/dL (ref 0.61–1.24)
GFR, Estimated: >60 mL/min (ref >60)
Glucose, Bld: 111 mg/dL — ABNORMAL HIGH (ref 70–99)
Potassium: 4.4 mmol/L (ref 3.5–5.1)
Sodium: 141 mmol/L (ref 135–145)

## 2024-01-03 LAB — CBC
HCT: 31.7 % — ABNORMAL LOW (ref 39.0–52.0)
Hemoglobin: 10.6 g/dL — ABNORMAL LOW (ref 13.0–17.0)
MCH: 29.8 pg (ref 26.0–34.0)
MCHC: 33.4 g/dL (ref 30.0–36.0)
MCV: 89 fL (ref 80.0–100.0)
Platelets: 250 K/uL (ref 150–400)
RBC: 3.56 MIL/uL — ABNORMAL LOW (ref 4.22–5.81)
RDW: 12.2 % (ref 11.5–15.5)
WBC: 9 K/uL (ref 4.0–10.5)
nRBC: 0 % (ref 0.0–0.2)

## 2024-01-03 MED ORDER — SODIUM CHLORIDE 0.9 % IV SOLN
2.0000 g | Freq: Three times a day (TID) | INTRAVENOUS | Status: DC
  Administered 2024-01-03 – 2024-01-05 (×6): 2 g via INTRAVENOUS
  Filled 2024-01-03 (×7): qty 12.5

## 2024-01-03 NOTE — TOC Initial Note (Incomplete Revision)
 Transition of Care [facility]) - Initial/Assessment Note    Patient Details  Name: [name] [name] MRN: [id number] Date of Birth: [birth year]  [facility] CM/SW Contact:    Rosalva Jon Bloch, RN Phone Number: 01/03/2024, 1:45 PM  Clinical Narrative:                  Presents with non-healing left BKA.        - s/p  I&D left BKA 7/8/ wound vac   Pt states from home with wife. PTA independent with ADLs. Active with [facility]. DME: W/C, BSC, shower chair, ramp. Pt without transportation issue or RX med concerns  Plan per provider:npo after MN/consent/labs ordered for OR tomorrow. Washout left BKA and possible above knee amputation.  01/03/2024 @ 1430 NCM received called from Tracy/ [employer]. Randine informed NCM pt has been approved for   [facility] team following and will assist with TOC needs....    Expected Discharge Plan: Home w Home Health Services (Active with [facility]; will need resumption orders from provider if needed @ d/c) Barriers to Discharge: Continued Medical Work up   Patient Goals and CMS Choice            Expected Discharge Plan and Services   Discharge Planning Services: CM Consult   Living arrangements for the past 2 months: Single Family Home                           HH Arranged: RN, PT, OT          Prior Living Arrangements/Services Living arrangements for the past 2 months: Single Family Home Lives with:: Spouse   Do you feel safe going back to the place where you live?: Yes          Current home services: DME (ramp, W/C, crutches, bsc , shower chair)    Activities of Daily Living   ADL Screening (condition at time of admission) Independently performs ADLs?: Yes (appropriate for developmental age) Is the patient deaf or have difficulty hearing?: No Does the patient have difficulty seeing, even when wearing glasses/contacts?: No Does the patient have difficulty concentrating, remembering, or making decisions?: No  Permission  Sought/Granted                  Emotional Assessment              Admission diagnosis:  Non-healing surgical wound, subsequent encounter [T81.89XD] Surgical wound infection [T81.49XA] Patient Active Problem List   Diagnosis Date Noted   Surgical wound infection 01/01/2024   S/P BKA (below knee amputation), left (HCC) 11/26/2023   Below-knee amputation of left lower extremity (HCC) 11/21/2023   Leg pain 11/21/2023   Changing skin lesion 01/03/2023   Mass of soft tissue of face 01/03/2023   Haglund's deformity of right heel    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   Status post arthroscopy of left knee 08/12/2019   Complex tear of medial meniscus of left knee 08/04/2019   Chest pain 05/27/2014   Left-sided chest wall pain 01/28/2014   Upper airway cough syndrome 01/28/2014   Arteriosclerosis of coronary artery 09/22/2013   HLD (hyperlipidemia) 09/22/2013   Essential hypertension 09/22/2013   Upper respiratory infection 07/25/2013   Dehydration 07/25/2013   Carpal tunnel syndrome, right 06/10/2013   H/O malignant neoplasm of skin 03/19/2013   Atypical chest pain 04/13/2012   Slurred speech 04/13/2012   Blurred vision, bilateral 04/13/2012   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   TIA (transient ischemic attack) 11/22/2011   Volume depletion 11/22/2011   Aphasia 11/21/2011   Musculoskeletal chest pain 11/21/2011   Normocytic anemia 11/21/2011   Thrombocytopenia (HCC) 11/21/2011   Metabolic encephalopathy 11/21/2011   Cerebral infarction [facility]) 06/05/2011   CAD (coronary artery disease) 06/05/2011   PCP:  Cena Simpers, MD Pharmacy:   [facility] - [place], [place] - [address] [place] [address] Phone: [phone] Fax: [phone]  [place] [facility] [address] [place] [address] Phone: [phone] Fax:  [phone]  [facility] [address] [place] [address] Phone: [phone] Fax: [phone]     Social Drivers of Health (SDOH) Social History: SDOH Screenings   Food Insecurity: No Food Insecurity (01/01/2024)  Housing: Low Risk  (01/01/2024)  Transportation Needs: No Transportation Needs (01/01/2024)  Utilities: Not At Risk (01/01/2024)  Financial Resource Strain: Low Risk  (08/25/2022)   Received from [facility]  Social Connections: Moderately Isolated (01/01/2024)  Stress: No Stress Concern Present (08/25/2022)   Received from [facility]  Tobacco Use: Medium Risk (12/31/2023)   SDOH Interventions:     Readmission Risk Interventions    11/26/2023    3:01 PM  Readmission Risk Prevention Plan  Medication Screening Complete  Transportation Screening Complete

## 2024-01-03 NOTE — Plan of Care (Signed)
  Problem: Education: Goal: Knowledge of General Education information will improve Description: Including pain rating scale, medication(s)/side effects and non-pharmacologic comfort measures Outcome: Progressing   Problem: Health Behavior/Discharge Planning: Goal: Ability to manage health-related needs will improve Outcome: Progressing   Problem: Clinical Measurements: Goal: Ability to maintain clinical measurements within normal limits will improve Outcome: Progressing Goal: Will remain free from infection Outcome: Progressing Goal: Diagnostic test results will improve Outcome: Progressing Goal: Respiratory complications will improve Outcome: Progressing Goal: Cardiovascular complication will be avoided Outcome: Progressing   Problem: Activity: Goal: Risk for activity intolerance will decrease Outcome: Progressing   Problem: Nutrition: Goal: Adequate nutrition will be maintained Outcome: Progressing   Problem: Coping: Goal: Level of anxiety will decrease Outcome: Progressing   Problem: Elimination: Goal: Will not experience complications related to bowel motility Outcome: Progressing Goal: Will not experience complications related to urinary retention Outcome: Progressing   Problem: Pain Managment: Goal: General experience of comfort will improve and/or be controlled Outcome: Progressing   Problem: Safety: Goal: Ability to remain free from injury will improve Outcome: Progressing   Problem: Skin Integrity: Goal: Risk for impaired skin integrity will decrease Outcome: Progressing   Problem: Education: Goal: Knowledge of the prescribed therapeutic regimen will improve Outcome: Progressing Goal: Ability to verbalize activity precautions or restrictions will improve Outcome: Progressing Goal: Understanding of discharge needs will improve Outcome: Progressing   Problem: Activity: Goal: Ability to perform//tolerate increased activity and mobilize with assistive  devices will improve Outcome: Progressing   Problem: Clinical Measurements: Goal: Postoperative complications will be avoided or minimized Outcome: Progressing   Problem: Self-Care: Goal: Ability to meet self-care needs will improve Outcome: Progressing   Problem: Self-Concept: Goal: Ability to maintain and perform role responsibilities to the fullest extent possible will improve Outcome: Progressing   Problem: Pain Management: Goal: Pain level will decrease with appropriate interventions Outcome: Progressing   Problem: Skin Integrity: Goal: Demonstration of wound healing without infection will improve Outcome: Progressing

## 2024-01-03 NOTE — H&P (View-Only) (Signed)
  Progress Note    01/03/2024 7:09 AM 2 Days Post-Op  Subjective:  sleeping, wakes easily.  Not really having any pain.  Wants to go off the floor and go down to rehab to visit.   Afebrile HR 70's 100's-120's systolic 97% RA  Vitals:   01/02/24 2046 01/03/24 0420  BP: 106/64 124/71  Pulse: 78 70  Resp: 15 15  Temp: 98.2 F (36.8 C) (!) 97.4 F (36.3 C)  SpO2: 99% 97%    Physical Exam: General:  no distress Lungs:  non labored Incisions:  wound vac in place with good seal   CBC    Component Value Date/Time   WBC 11.6 (H) 01/02/2024 0527   RBC 3.60 (L) 01/02/2024 0527   HGB 10.9 (L) 01/02/2024 0527   HCT 31.4 (L) 01/02/2024 0527   PLT 279 01/02/2024 0527   MCV 87.2 01/02/2024 0527   MCH 30.3 01/02/2024 0527   MCHC 34.7 01/02/2024 0527   RDW 11.9 01/02/2024 0527   LYMPHSABS 2.3 12/03/2023 0512   MONOABS 0.8 12/03/2023 0512   EOSABS 0.2 12/03/2023 0512   BASOSABS 0.0 12/03/2023 0512    BMET    Component Value Date/Time   NA 136 01/02/2024 0527   K 4.2 01/02/2024 0527   CL 103 01/02/2024 0527   CO2 26 01/02/2024 0527   GLUCOSE 174 (H) 01/02/2024 0527   BUN 23 01/02/2024 0527   CREATININE 0.79 01/02/2024 0527   CALCIUM  8.4 (L) 01/02/2024 0527   GFRNONAA >60 01/02/2024 0527   GFRAA >60 07/31/2019 1241    INR    Component Value Date/Time   INR 1.1 11/13/2023 1100     Intake/Output Summary (Last 24 hours) at 01/03/2024 0709 Last data filed at 01/03/2024 0519 Gross per 24 hour  Intake 560 ml  Output 1300 ml  Net -740 ml      Component Ref Range & Units (hover) 2 d ago  Specimen Description WOUND LEFT LOWER LEG  Special Requests NONE  Gram Stain NO WBC SEEN MODERATE GRAM POSITIVE COCCI  Culture FEW GRAM NEGATIVE RODS CULTURE REINCUBATED FOR BETTER GROWTH SUSCEPTIBILITIES TO FOLLOW Performed at [facility], [address], [place], [place] [address]  Report Status PENDING       Assessment/Plan:  78 y.o. male is s/p:  Debridement  with wound vac placement left BKA stump 01/01/2024 by Dr. Serene   2 Days Post-Op   -wound vac with good seal.   -culture results today with few GNF.  Pharmacy consult for appropriate broad spectrum IV abx.  I have discontinued Keflex  and Doxy and continued Vanc.   -ok for pt to leave floor in wheelchair with family -DVT prophylaxis:  sq heparin  -npo after MN/consent/labs ordered for OR tomorrow.  Washout left BKA and possible above knee amputation.  Pt expressed good understanding.    Lucie Apt, PA-C [facility] [phone] 01/03/2024 7:09 AM   I agree with the above.  Cultures positive for Enterobacter.  Adjusting antibiotics.  Discussed going back to the operating room tomorrow for revision of below the, likely above-knee potation.  Malvina Serene

## 2024-01-03 NOTE — Progress Notes (Addendum)
  Progress Note    01/03/2024 7:09 AM 2 Days Post-Op  Subjective:  sleeping, wakes easily.  Not really having any pain.  Wants to go off the floor and go down to rehab to visit.   Afebrile HR 70's 100's-120's systolic 97% RA  Vitals:   01/02/24 2046 01/03/24 0420  BP: 106/64 124/71  Pulse: 78 70  Resp: 15 15  Temp: 98.2 F (36.8 C) (!) 97.4 F (36.3 C)  SpO2: 99% 97%    Physical Exam: General:  no distress Lungs:  non labored Incisions:  wound vac in place with good seal   CBC    Component Value Date/Time   WBC 11.6 (H) 01/02/2024 0527   RBC 3.60 (L) 01/02/2024 0527   HGB 10.9 (L) 01/02/2024 0527   HCT 31.4 (L) 01/02/2024 0527   PLT 279 01/02/2024 0527   MCV 87.2 01/02/2024 0527   MCH 30.3 01/02/2024 0527   MCHC 34.7 01/02/2024 0527   RDW 11.9 01/02/2024 0527   LYMPHSABS 2.3 12/03/2023 0512   MONOABS 0.8 12/03/2023 0512   EOSABS 0.2 12/03/2023 0512   BASOSABS 0.0 12/03/2023 0512    BMET    Component Value Date/Time   NA 136 01/02/2024 0527   K 4.2 01/02/2024 0527   CL 103 01/02/2024 0527   CO2 26 01/02/2024 0527   GLUCOSE 174 (H) 01/02/2024 0527   BUN 23 01/02/2024 0527   CREATININE 0.79 01/02/2024 0527   CALCIUM  8.4 (L) 01/02/2024 0527   GFRNONAA >60 01/02/2024 0527   GFRAA >60 07/31/2019 1241    INR    Component Value Date/Time   INR 1.1 11/13/2023 1100     Intake/Output Summary (Last 24 hours) at 01/03/2024 0709 Last data filed at 01/03/2024 0519 Gross per 24 hour  Intake 560 ml  Output 1300 ml  Net -740 ml      Component Ref Range & Units (hover) 2 d ago  Specimen Description WOUND LEFT LOWER LEG  Special Requests NONE  Gram Stain NO WBC SEEN MODERATE GRAM POSITIVE COCCI  Culture FEW GRAM NEGATIVE RODS CULTURE REINCUBATED FOR BETTER GROWTH SUSCEPTIBILITIES TO FOLLOW Performed at [facility], [address] [place], [place] [address]  Report Status PENDING       Assessment/Plan:  78 y.o. male is s/p:  Debridement  with wound vac placement left BKA stump 01/01/2024 by Dr. Serene   2 Days Post-Op   -wound vac with good seal.   -culture results today with few GNF.  Pharmacy consult for appropriate broad spectrum IV abx.  I have discontinued Keflex  and Doxy and continued Vanc.   -ok for pt to leave floor in wheelchair with family -DVT prophylaxis:  sq heparin  -npo after MN/consent/labs ordered for OR tomorrow.  Washout left BKA and possible above knee amputation.  Pt expressed good understanding.    Lucie Apt, PA-C [facility] [phone] 01/03/2024 7:09 AM   I agree with the above.  Cultures positive for Enterobacter.  Adjusting antibiotics.  Discussed going back to the operating room tomorrow for revision of below the, likely above-knee potation.  Malvina Serene

## 2024-01-03 NOTE — TOC Initial Note (Signed)
 Transition of Care [facility]) - Initial/Assessment Note    Patient Details  Name: [name] [name] MRN: [id number] Date of Birth: 04-Dec-1945  Transition [facility] Care [facility]) CM/SW Contact:    Rosalva Jon Bloch, RN Phone Number: 01/03/2024, 1:45 PM  Clinical Narrative:                  Presents with non-healing left BKA.        - s/p  I&D left BKA 7/8/ wound vac   Pt states from home with wife. PTA independent with ADLs. Active with [facility]. DME: W/C, BSC, shower chair, ramp. Pt without transportation issue or RX med concerns  Plan per provider:npo after MN/consent/labs ordered for OR tomorrow. Washout left BKA and possible above knee amputation.  TOC team following and will assist with TOC needs....    Expected Discharge Plan: Home w Home Health Services (Active with [facility]; will need resumption orders from provider if needed @ d/c) Barriers to Discharge: Continued Medical Work up   Patient Goals and CMS Choice            Expected Discharge Plan and Services   Discharge Planning Services: CM Consult   Living arrangements for the past 2 months: Single Family Home                           HH Arranged: RN, PT, OT          Prior Living Arrangements/Services Living arrangements for the past 2 months: Single Family Home Lives with:: Spouse   Do you feel safe going back to the place where you live?: Yes          Current home services: DME (ramp, W/C, crutches, bsc , shower chair)    Activities of Daily Living   ADL Screening (condition at time of admission) Independently performs ADLs?: Yes (appropriate for developmental age) Is the patient deaf or have difficulty hearing?: No Does the patient have difficulty seeing, even when wearing glasses/contacts?: No Does the patient have difficulty concentrating, remembering, or making decisions?: No  Permission Sought/Granted                  Emotional Assessment              Admission diagnosis:   Non-healing surgical wound, subsequent encounter [T81.89XD] Surgical wound infection [T81.49XA] Patient Active Problem List   Diagnosis Date Noted   Surgical wound infection 01/01/2024   S/P BKA (below knee amputation), left (HCC) 11/26/2023   Below-knee amputation of left lower extremity (HCC) 11/21/2023   Leg pain 11/21/2023   Changing skin lesion 01/03/2023   Mass of soft tissue of face 01/03/2023   Haglund's deformity of right heel    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   Status post arthroscopy of left knee 08/12/2019   Complex tear of medial meniscus of left knee 08/04/2019   Chest pain 05/27/2014   Left-sided chest wall pain 01/28/2014   Upper airway cough syndrome 01/28/2014   Arteriosclerosis of coronary artery 09/22/2013   HLD (hyperlipidemia) 09/22/2013   Essential hypertension 09/22/2013   Upper respiratory infection 07/25/2013   Dehydration 07/25/2013   Carpal tunnel syndrome, right 06/10/2013   H/O malignant neoplasm of skin 03/19/2013   Atypical chest pain 04/13/2012   Slurred speech 04/13/2012   Blurred vision, bilateral 04/13/2012   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   TIA (transient ischemic attack) 11/22/2011   Volume depletion 11/22/2011   Aphasia 11/21/2011  Musculoskeletal chest pain 11/21/2011   Normocytic anemia 11/21/2011   Thrombocytopenia (HCC) 11/21/2011   Metabolic encephalopathy 11/21/2011   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 [place] [address] Phone: [phone]  Fax: [phone]     Social Drivers of Health (SDOH) Social History: SDOH Screenings   Food Insecurity: No Food Insecurity (01/01/2024)  Housing: Low Risk  (01/01/2024)  Transportation Needs: No Transportation Needs (01/01/2024)  Utilities: Not At Risk (01/01/2024)  Financial Resource Strain: Low Risk  (08/25/2022)   Received from [facility]  Social Connections: Moderately Isolated (01/01/2024)  Stress: No Stress Concern Present (08/25/2022)   Received from [facility]  Tobacco Use: Medium Risk (12/31/2023)   SDOH Interventions:     Readmission Risk Interventions    11/26/2023    3:01 PM  Readmission Risk Prevention Plan  Medication Screening Complete  Transportation Screening Complete

## 2024-01-04 ENCOUNTER — Inpatient Hospital Stay (HOSPITAL_COMMUNITY): Payer: Self-pay | Admitting: Registered Nurse

## 2024-01-04 ENCOUNTER — Other Ambulatory Visit: Payer: Self-pay

## 2024-01-04 ENCOUNTER — Encounter (HOSPITAL_COMMUNITY): Payer: Self-pay | Admitting: Surgery

## 2024-01-04 ENCOUNTER — Encounter (HOSPITAL_COMMUNITY)
Admission: AD | Disposition: A | Discharge status: Home Health | Payer: Self-pay | Source: Home / Self Care | Attending: Surgery

## 2024-01-04 DIAGNOSIS — I2510 Atherosclerotic heart disease of native coronary artery without angina pectoris: Secondary | ICD-10-CM

## 2024-01-04 DIAGNOSIS — T8744 Infection of amputation stump, left lower extremity: Secondary | ICD-10-CM | POA: Diagnosis not present

## 2024-01-04 DIAGNOSIS — Z87891 Personal history of nicotine dependence: Secondary | ICD-10-CM

## 2024-01-04 DIAGNOSIS — I10 Essential (primary) hypertension: Secondary | ICD-10-CM | POA: Diagnosis not present

## 2024-01-04 DIAGNOSIS — Z89512 Acquired absence of left leg below knee: Secondary | ICD-10-CM

## 2024-01-04 HISTORY — PX: AMPUTATION: SHX166

## 2024-01-04 LAB — AEROBIC/ANAEROBIC CULTURE W GRAM STAIN (SURGICAL/DEEP WOUND): Gram Stain: NONE SEEN

## 2024-01-04 LAB — BASIC METABOLIC PANEL WITH GFR
Anion gap: 7 (ref 5–15)
BUN: 13 mg/dL (ref 8–23)
CO2: 29 mmol/L (ref 22–32)
Calcium: 9.1 mg/dL (ref 8.9–10.3)
Chloride: 105 mmol/L (ref 98–111)
Creatinine, Ser: 0.71 mg/dL (ref 0.61–1.24)
GFR, Estimated: >60 mL/min (ref >60)
Glucose, Bld: 112 mg/dL — ABNORMAL HIGH (ref 70–99)
Potassium: 4.7 mmol/L (ref 3.5–5.1)
Sodium: 141 mmol/L (ref 135–145)

## 2024-01-04 SURGERY — AMPUTATION, ABOVE KNEE
Anesthesia: General | Site: Knee | Laterality: Left

## 2024-01-04 MED ORDER — PROPOFOL 10 MG/ML IV BOLUS
INTRAVENOUS | Status: DC | PRN
  Administered 2024-01-04 (×2): 100 mg via INTRAVENOUS

## 2024-01-04 MED ORDER — LACTATED RINGERS IV SOLN: INTRAVENOUS | Status: DC

## 2024-01-04 MED ORDER — ONDANSETRON HCL 4 MG/2ML IJ SOLN
INTRAMUSCULAR | Status: DC | PRN
  Administered 2024-01-04: 4 mg via INTRAVENOUS

## 2024-01-04 MED ORDER — PHENYLEPHRINE 80 MCG/ML (10ML) SYRINGE FOR IV PUSH (FOR BLOOD PRESSURE SUPPORT)
PREFILLED_SYRINGE | INTRAVENOUS | Status: AC
  Filled 2024-01-04: qty 10

## 2024-01-04 MED ORDER — BUPIVACAINE LIPOSOME 1.3 % IJ SUSP
INTRAMUSCULAR | Status: AC
  Filled 2024-01-04: qty 20

## 2024-01-04 MED ORDER — ONDANSETRON HCL 4 MG/2ML IJ SOLN
INTRAMUSCULAR | Status: AC
  Filled 2024-01-04: qty 2

## 2024-01-04 MED ORDER — OXYCODONE HCL 5 MG PO TABS
5.0000 mg | ORAL_TABLET | Freq: Once | ORAL | Status: AC | PRN
  Administered 2024-01-04: 5 mg via ORAL

## 2024-01-04 MED ORDER — HEMOSTATIC AGENTS (NO CHARGE) OPTIME
TOPICAL | Status: DC | PRN
  Administered 2024-01-04: 1 via TOPICAL

## 2024-01-04 MED ORDER — ONDANSETRON HCL 4 MG/2ML IJ SOLN: 4.0000 mg | Freq: Once | INTRAMUSCULAR | Status: DC | PRN

## 2024-01-04 MED ORDER — LIDOCAINE 2% (20 MG/ML) 5 ML SYRINGE
INTRAMUSCULAR | Status: AC
  Filled 2024-01-04: qty 5

## 2024-01-04 MED ORDER — CHLORHEXIDINE GLUCONATE 0.12 % MT SOLN: 15.0000 mL | Freq: Once | OROMUCOSAL | Status: AC

## 2024-01-04 MED ORDER — SODIUM CHLORIDE (PF) 0.9 % IJ SOLN
INTRAMUSCULAR | Status: AC
  Filled 2024-01-04: qty 50

## 2024-01-04 MED ORDER — PHENYLEPHRINE 80 MCG/ML (10ML) SYRINGE FOR IV PUSH (FOR BLOOD PRESSURE SUPPORT)
PREFILLED_SYRINGE | INTRAVENOUS | Status: DC | PRN
  Administered 2024-01-04 (×3): 80 ug via INTRAVENOUS
  Administered 2024-01-04: 160 ug via INTRAVENOUS
  Administered 2024-01-04: 80 ug via INTRAVENOUS

## 2024-01-04 MED ORDER — OXYCODONE HCL 5 MG PO TABS
ORAL_TABLET | ORAL | Status: AC
  Filled 2024-01-04: qty 1

## 2024-01-04 MED ORDER — ROCURONIUM BROMIDE 10 MG/ML (PF) SYRINGE
PREFILLED_SYRINGE | INTRAVENOUS | Status: AC
  Filled 2024-01-04: qty 10

## 2024-01-04 MED ORDER — ORAL CARE MOUTH RINSE
15.0000 mL | Freq: Once | OROMUCOSAL | Status: AC
Start: 2024-01-04 — End: 2024-01-04

## 2024-01-04 MED ORDER — SODIUM CHLORIDE FLUSH 0.9 % IV SOLN
INTRAVENOUS | Status: DC | PRN
  Administered 2024-01-04: 100 mL

## 2024-01-04 MED ORDER — MORPHINE SULFATE (PF) 2 MG/ML IV SOLN
2.0000 mg | INTRAVENOUS | Status: DC | PRN
  Administered 2024-01-04 (×2): 2 mg via INTRAVENOUS
  Filled 2024-01-04 (×2): qty 1

## 2024-01-04 MED ORDER — VASOPRESSIN 20 UNIT/ML IV SOLN
INTRAVENOUS | Status: AC
  Filled 2024-01-04: qty 1

## 2024-01-04 MED ORDER — DEXMEDETOMIDINE HCL IN NACL 80 MCG/20ML IV SOLN
INTRAVENOUS | Status: AC
  Filled 2024-01-04: qty 20

## 2024-01-04 MED ORDER — ACETAMINOPHEN 10 MG/ML IV SOLN: 1000.0000 mg | Freq: Once | INTRAVENOUS | Status: DC | PRN

## 2024-01-04 MED ORDER — FENTANYL CITRATE (PF) 100 MCG/2ML IJ SOLN
INTRAMUSCULAR | Status: AC
  Filled 2024-01-04: qty 2

## 2024-01-04 MED ORDER — OXYCODONE HCL 5 MG/5ML PO SOLN: 5.0000 mg | Freq: Once | ORAL | Status: AC | PRN

## 2024-01-04 MED ORDER — CHLORHEXIDINE GLUCONATE 0.12 % MT SOLN
OROMUCOSAL | Status: AC
  Administered 2024-01-04: 15 mL via OROMUCOSAL
  Filled 2024-01-04: qty 15

## 2024-01-04 MED ORDER — FENTANYL CITRATE (PF) 100 MCG/2ML IJ SOLN
25.0000 ug | INTRAMUSCULAR | Status: DC | PRN
  Administered 2024-01-04 (×3): 50 ug via INTRAVENOUS

## 2024-01-04 MED ORDER — FENTANYL CITRATE (PF) 250 MCG/5ML IJ SOLN
INTRAMUSCULAR | Status: AC
  Filled 2024-01-04: qty 5

## 2024-01-04 MED ORDER — PHENYLEPHRINE HCL-NACL 20-0.9 MG/250ML-% IV SOLN
INTRAVENOUS | Status: DC | PRN
  Administered 2024-01-04: 40 ug/min via INTRAVENOUS

## 2024-01-04 MED ORDER — BUPIVACAINE HCL (PF) 0.5 % IJ SOLN
INTRAMUSCULAR | Status: AC
  Filled 2024-01-04: qty 30

## 2024-01-04 MED ORDER — EPHEDRINE SULFATE-NACL 50-0.9 MG/10ML-% IV SOSY
PREFILLED_SYRINGE | INTRAVENOUS | Status: DC | PRN
  Administered 2024-01-04: 7.5 mg via INTRAVENOUS
  Administered 2024-01-04: 5 mg via INTRAVENOUS
  Administered 2024-01-04: 10 mg via INTRAVENOUS
  Administered 2024-01-04: 2.5 mg via INTRAVENOUS

## 2024-01-04 MED ORDER — EPHEDRINE 5 MG/ML INJ
INTRAVENOUS | Status: AC
  Filled 2024-01-04: qty 5

## 2024-01-04 MED ORDER — FENTANYL CITRATE (PF) 250 MCG/5ML IJ SOLN
INTRAMUSCULAR | Status: DC | PRN
  Administered 2024-01-04 (×4): 50 ug via INTRAVENOUS
  Administered 2024-01-04 (×2): 25 ug via INTRAVENOUS

## 2024-01-04 MED ORDER — 0.9 % SODIUM CHLORIDE (POUR BTL) OPTIME
TOPICAL | Status: DC | PRN
  Administered 2024-01-04: 1000 mL

## 2024-01-04 MED ORDER — LIDOCAINE 2% (20 MG/ML) 5 ML SYRINGE
INTRAMUSCULAR | Status: DC | PRN
  Administered 2024-01-04: 100 mg via INTRAVENOUS

## 2024-01-04 MED ORDER — DEXAMETHASONE SODIUM PHOSPHATE 10 MG/ML IJ SOLN
INTRAMUSCULAR | Status: DC | PRN
  Administered 2024-01-04: 10 mg via INTRAVENOUS

## 2024-01-04 MED ORDER — DEXAMETHASONE SODIUM PHOSPHATE 10 MG/ML IJ SOLN
INTRAMUSCULAR | Status: AC
Start: 2024-01-04 — End: 2024-01-04
  Filled 2024-01-04: qty 1

## 2024-01-04 MED ORDER — DEXMEDETOMIDINE HCL IN NACL 80 MCG/20ML IV SOLN
INTRAVENOUS | Status: DC | PRN
  Administered 2024-01-04: 4 ug via INTRAVENOUS

## 2024-01-04 MED ORDER — KETOROLAC TROMETHAMINE 30 MG/ML IJ SOLN
30.0000 mg | Freq: Four times a day (QID) | INTRAMUSCULAR | Status: DC | PRN
  Administered 2024-01-04 – 2024-01-05 (×2): 30 mg via INTRAVENOUS
  Filled 2024-01-04 (×2): qty 1

## 2024-01-04 MED ORDER — FENTANYL CITRATE (PF) 100 MCG/2ML IJ SOLN
INTRAMUSCULAR | Status: AC
Start: 2024-01-04 — End: 2024-01-04
  Filled 2024-01-04: qty 2

## 2024-01-04 MED ORDER — PROPOFOL 10 MG/ML IV BOLUS
INTRAVENOUS | Status: AC
  Filled 2024-01-04: qty 20

## 2024-01-04 SURGICAL SUPPLY — 40 items
BAG COUNTER SPONGE SURGICOUNT (BAG) ×2 IMPLANT
BLADE SAW GIGLI 510 (BLADE) ×2 IMPLANT
BNDG COHESIVE 4X5 TAN STRL LF (GAUZE/BANDAGES/DRESSINGS) ×1 IMPLANT
BNDG COHESIVE 6X5 TAN ST LF (GAUZE/BANDAGES/DRESSINGS) ×3 IMPLANT
BNDG ELASTIC 4INX 5YD STR LF (GAUZE/BANDAGES/DRESSINGS) ×1 IMPLANT
BNDG ELASTIC 4X5.8 VLCR STR LF (GAUZE/BANDAGES/DRESSINGS) ×1 IMPLANT
BNDG ELASTIC 6INX 5YD STR LF (GAUZE/BANDAGES/DRESSINGS) ×1 IMPLANT
BNDG GAUZE DERMACEA FLUFF 4 (GAUZE/BANDAGES/DRESSINGS) ×3 IMPLANT
CANISTER SUCTION 3000ML PPV (SUCTIONS) ×2 IMPLANT
CLIP TI MEDIUM 6 (CLIP) ×2 IMPLANT
COVER SURGICAL LIGHT HANDLE (MISCELLANEOUS) ×2 IMPLANT
DRAPE HALF SHEET 40X57 (DRAPES) ×2 IMPLANT
DRAPE SURG ORHT 6 SPLT 77X108 (DRAPES) ×4 IMPLANT
DRSG ADAPTIC 3X8 NADH LF (GAUZE/BANDAGES/DRESSINGS) ×1 IMPLANT
ELECTRODE REM PT RTRN 9FT ADLT (ELECTROSURGICAL) ×2 IMPLANT
GAUZE 4X4 16PLY ~~LOC~~+RFID DBL (SPONGE) ×1 IMPLANT
GAUZE SPONGE 4X4 12PLY STRL (GAUZE/BANDAGES/DRESSINGS) ×3 IMPLANT
GAUZE XEROFORM 5X9 LF (GAUZE/BANDAGES/DRESSINGS) IMPLANT
GLOVE SURG SS PI 7.5 STRL IVOR (GLOVE) ×4 IMPLANT
GOWN STRL REUS W/ TWL LRG LVL3 (GOWN DISPOSABLE) ×4 IMPLANT
GOWN STRL REUS W/ TWL XL LVL3 (GOWN DISPOSABLE) ×2 IMPLANT
IV NS IRRIG 3000ML ARTHROMATIC (IV SOLUTION) ×2 IMPLANT
KIT BASIN OR (CUSTOM PROCEDURE TRAY) ×2 IMPLANT
KIT TURNOVER KIT B (KITS) ×2 IMPLANT
NDL 18GX1X1/2 (RX/OR ONLY) (NEEDLE) IMPLANT
NDL HYPO 25GX1X1/2 BEV (NEEDLE) ×1 IMPLANT
NEEDLE 18GX1X1/2 (RX/OR ONLY) (NEEDLE) ×2 IMPLANT
NEEDLE HYPO 25GX1X1/2 BEV (NEEDLE) ×2 IMPLANT
NS IRRIG 1000ML POUR BTL (IV SOLUTION) ×2 IMPLANT
PACK GENERAL/GYN (CUSTOM PROCEDURE TRAY) ×2 IMPLANT
PAD ARMBOARD POSITIONER FOAM (MISCELLANEOUS) ×4 IMPLANT
STAPLER SKIN PROX 35W (STAPLE) ×2 IMPLANT
STOCKINETTE IMPERVIOUS LG (DRAPES) ×2 IMPLANT
SURGIFLO W/THROMBIN 8M KIT (HEMOSTASIS) ×1 IMPLANT
SUT SILK 0 TIES 10X30 (SUTURE) ×2 IMPLANT
SUT VIC AB 2-0 CT1 18 (SUTURE) ×4 IMPLANT
SYR 30ML LL (SYRINGE) ×1 IMPLANT
TOWEL GREEN STERILE (TOWEL DISPOSABLE) ×2 IMPLANT
UNDERPAD 30X36 HEAVY ABSORB (UNDERPADS AND DIAPERS) ×3 IMPLANT
WATER STERILE IRR 1000ML POUR (IV SOLUTION) ×2 IMPLANT

## 2024-01-04 NOTE — Anesthesia Postprocedure Evaluation (Signed)
 Anesthesia Post Note  Patient: [name] [name]  Procedure(s) Performed: AMPUTATION, ABOVE KNEE LEFT (Left: Knee)     Patient location during evaluation: PACU Anesthesia Type: General Level of consciousness: awake and alert Pain management: pain level controlled Vital Signs Assessment: post-procedure vital signs reviewed and stable Respiratory status: spontaneous breathing, nonlabored ventilation, respiratory function stable and patient connected to nasal cannula oxygen Cardiovascular status: blood pressure returned to baseline and stable Postop Assessment: no apparent nausea or vomiting Anesthetic complications: no   There were no known notable events for this encounter.  Last Vitals:  Vitals:   01/04/24 1000 01/04/24 1031  BP: 125/71 (!) 141/80  Pulse: 81 85  Resp: 13 16  Temp: 36.7 C (!) 36.3 C  SpO2: 94% 100%    Last Pain:  Vitals:   01/04/24 1226  TempSrc:   PainSc: 8                  Rome Ade

## 2024-01-04 NOTE — Interval H&P Note (Signed)
 History and Physical Interval Note:  01/04/2024 7:30 AM  [name] [name]  has presented today for surgery, with the diagnosis of s/p L BKA.  The various methods of treatment have been discussed with the patient and family. After consideration of risks, benefits and other options for treatment, the patient has consented to  Procedure(s): IRRIGATION AND DEBRIDEMENT WOUND (Left) AMPUTATION, ABOVE KNEE (Left) as a surgical intervention.  The patient's history has been reviewed, patient examined, no change in status, stable for surgery.  I have reviewed the patient's chart and labs.  Questions were answered to the patient's satisfaction.     Malvina New

## 2024-01-04 NOTE — Op Note (Signed)
    Patient name: [name] [name] MRN: [id number] DOB: 07/13/1945 Sex: male  01/04/2024 Pre-operative Diagnosis: Infected left below-knee amputation Post-operative diagnosis:  Same Surgeon:  [name] [name] Assistants: [name] [name] Procedure:   Left above-knee amputation Anesthesia:  general Blood Loss:  minimal Specimens:  leg  Findings: On inspection of the below-knee amputation, there was residual dead muscle that extended up to the point where a below-knee amputation was not possible and so the decision was made to convert to an above-knee amputation.  All tissue.  Viable and healthy without infection at the level of the amputation site.  Indications: This is a 78 year old gentleman with history of below-knee potation on the left.  Unfortunately this did not heal secondary to infection.  He was taken to the operating room 3 days ago and had the wound opened and washed out with resection of necrotic muscle.  He comes back in today for a second look and possible conversion to above-knee amputation  Procedure:  The patient was identified in the holding area and taken to [facility] OR ROOM 12  The patient was then placed supine on the table. general anesthesia was administered.  The patient was prepped and draped in the usual sterile fashion.  A time out was called and antibiotics were administered.  The previous below-knee amputation was inspected.  There was dead muscle extending more proximal.  There was a foul odor.  I did not think that this would be salvageable because of the amount of muscle that had to be resected.  The decision was made to convert to an above-knee amputation.  A fishmouth incision was made just above the knee.  Cautery was used to divide subcutaneous tissue and muscle down to the femur.  The femur was circumferentially exposed.  A periosteal elevator was used to elevate the periosteum and a Gigli saw was used to transect the femur, beveling the anterior surface.  A rasp was used to  smooth the bone surface edges.  The neurovascular bundle was identified and divided between clamps and the remaining muscle was divided with cautery.  The leg was removed as a specimen.  All muscle appeared viable with no evidence of infection.  I then individually ligated the neurovascular bundle proximal to the cut edge of the femur.  Exparel  was placed in the nerve as well as in the tissue.  The wound was copiously irrigated.  Hemostasis was achieved.  The fascia was reapproximated with interrupted 2-0 Vicryl.  The skin was closed with staples followed by sterile dressings.  There were no immediate complications.   Disposition: To PACU stable.   [name], M.D., [facility] [place] Office: [phone] Pager:  [phone]

## 2024-01-04 NOTE — Anesthesia Procedure Notes (Signed)
 Procedure Name: LMA Insertion Date/Time: 01/04/2024 7:47 AM  Performed by: Christopher Comings, CRNAPre-anesthesia Checklist: Patient identified, Emergency Drugs available, Suction available and Patient being monitored Patient Re-evaluated:Patient Re-evaluated prior to induction Oxygen Delivery Method: Circle system utilized Preoxygenation: Pre-oxygenation with 100% oxygen Induction Type: IV induction Ventilation: Mask ventilation without difficulty LMA: LMA inserted LMA Size: 4.0 Number of attempts: 1 Placement Confirmation: positive ETCO2 and breath sounds checked- equal and bilateral Tube secured with: Tape Dental Injury: Teeth and Oropharynx as per pre-operative assessment

## 2024-01-04 NOTE — Transfer of Care (Signed)
 Immediate Anesthesia Transfer of Care Note  Patient: [name] [name]  Procedure(s) Performed: AMPUTATION, ABOVE KNEE LEFT (Left: Knee)  Patient Location: PACU  Anesthesia Type:General  Level of Consciousness: awake, alert , and oriented  Airway & Oxygen Therapy: Patient connected to face mask oxygen  Post-op Assessment: Report given to RN and Post -op Vital signs reviewed and stable  Post vital signs: Reviewed and stable  Last Vitals:  Vitals Value Taken Time  BP 138/81 01/04/24 09:15  Temp    Pulse 83 01/04/24 09:16  Resp 16 01/04/24 09:16  SpO2 99 % 01/04/24 09:16  Vitals shown include unfiled device data.  Last Pain:  Vitals:   01/04/24 0721  TempSrc:   PainSc: 7       Patients Stated Pain Goal: 4 (01/03/24 1400)  Complications: There were no known notable events for this encounter.

## 2024-01-04 NOTE — Anesthesia Preprocedure Evaluation (Signed)
 Anesthesia Evaluation  Patient identified by MRN, date of birth, ID band Patient awake    Reviewed: Allergy & Precautions, NPO status , Patient's Chart, lab work & pertinent test results  History of Anesthesia Complications Negative for: history of anesthetic complications  Airway Mallampati: II  TM Distance: >3 FB Neck ROM: Full    Dental  (+) Dental Advisory Given, Edentulous Upper   Pulmonary sleep apnea , former smoker   Pulmonary exam normal        Cardiovascular hypertension, + CAD, + Past MI, + Cardiac Stents and + Peripheral Vascular Disease  Normal cardiovascular exam     Neuro/Psych  PSYCHIATRIC DISORDERS Anxiety Depression    CVA, Residual Symptoms    GI/Hepatic ,GERD  Medicated and Controlled,,(+) Hepatitis -, A  Endo/Other  negative endocrine ROS    Renal/GU negative Renal ROS     Musculoskeletal negative musculoskeletal ROS (+)    Abdominal   Peds  Hematology negative hematology ROS (+)   Anesthesia Other Findings Past Medical History: No date: Anxiety No date: Cancer (HCC)     Comment:  skin cancer No date: Carpal tunnel syndrome No date: Coronary artery disease     Comment:  DES x4 No date: Depression No date: Hepatitis     Comment:  Hepatitis A during [place] - received treatment No date: Hypercholesteremia No date: Hypertension 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 Sinus surgery fixed OSA issues No date: Stroke [facility])     Comment:  slight weakness left- 85- 90 % returned   Reproductive/Obstetrics                              Anesthesia Physical Anesthesia Plan  ASA: 3  Anesthesia Plan: General    Post-op Pain Management: Tylenol  PO (pre-op)*   Induction: Intravenous  PONV Risk Score and Plan: 2 and Treatment may vary due to age or medical condition, Ondansetron  and Dexamethasone   Airway Management Planned: LMA  Additional Equipment: None  Intra-op Plan:   Post-operative Plan: Extubation in OR  Informed Consent: I have reviewed the patients History and Physical, chart, labs and discussed the procedure including the risks, benefits and alternatives for the proposed anesthesia with the patient or authorized representative who has indicated his/her understanding and acceptance.     Dental advisory given  Plan Discussed with: CRNA and Anesthesiologist  Anesthesia Plan Comments: (Discussed risks of anesthesia with patient, including PONV, sore throat, lip/dental/eye damage. Rare risks discussed as well, such as cardiorespiratory and neurological sequelae, and allergic reactions. Discussed the role of CRNA in patient's perioperative care. Patient understands.)         Anesthesia Quick Evaluation

## 2024-01-05 ENCOUNTER — Other Ambulatory Visit (HOSPITAL_COMMUNITY): Payer: Self-pay

## 2024-01-05 ENCOUNTER — Encounter (HOSPITAL_COMMUNITY): Payer: Self-pay | Admitting: Surgery

## 2024-01-05 DIAGNOSIS — Z89612 Acquired absence of left leg above knee: Secondary | ICD-10-CM

## 2024-01-05 MED ORDER — AMOXICILLIN 500 MG PO CAPS
1000.0000 mg | ORAL_CAPSULE | Freq: Two times a day (BID) | ORAL | Status: DC
  Administered 2024-01-05: 1000 mg via ORAL
  Filled 2024-01-05 (×2): qty 2

## 2024-01-05 MED ORDER — AMOXICILLIN 500 MG PO CAPS
1000.0000 mg | ORAL_CAPSULE | Freq: Two times a day (BID) | ORAL | 0 refills | Status: AC
  Filled 2024-01-05: qty 56, 14d supply, fill #0

## 2024-01-05 MED ORDER — SULFAMETHOXAZOLE-TRIMETHOPRIM 800-160 MG PO TABS
1.0000 | ORAL_TABLET | Freq: Two times a day (BID) | ORAL | Status: DC
  Administered 2024-01-05: 1 via ORAL
  Filled 2024-01-05 (×2): qty 1

## 2024-01-05 MED ORDER — SULFAMETHOXAZOLE-TRIMETHOPRIM 800-160 MG PO TABS
1.0000 | ORAL_TABLET | Freq: Two times a day (BID) | ORAL | 0 refills | Status: AC
  Filled 2024-01-05: qty 28, 14d supply, fill #0

## 2024-01-05 MED ORDER — OXYCODONE-ACETAMINOPHEN 5-325 MG PO TABS
1.0000 | ORAL_TABLET | Freq: Four times a day (QID) | ORAL | 0 refills | Status: AC | PRN
  Filled 2024-01-05: qty 20, 5d supply, fill #0

## 2024-01-05 NOTE — Plan of Care (Signed)
   Problem: Education: Goal: Knowledge of General Education information will improve Description Including pain rating scale, medication(s)/side effects and non-pharmacologic comfort measures Outcome: Progressing

## 2024-01-05 NOTE — Care Management (Cosign Needed)
    Durable Medical Equipment  (From admission, onward)           Start     Ordered   01/05/24 1144  For home use only DME Walker rolling  Once       Question Answer Comment  Walker: With 5 Inch Wheels   Patient needs a walker to treat with the following condition Weakness      01/05/24 1144           Per therapy recommendations.

## 2024-01-05 NOTE — Evaluation (Signed)
 Physical Therapy Re-Evaluation Patient Details Name: [name] [name] MRN: [id number] DOB: 04/20/1946 Today's Date: 01/05/2024  History of Present Illness  [name] [name] is a 78 y.o. 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 Pt s/p I&D to L wounds 7/8. S/p I&D L BKA and wound vac placement 7/9, revision to L AKA 7/11. PMHx: HTN, HLD, depression/anxiety, CAD s/p PCI x4, PTSD, CVA w/residual L weakness, agent orange exposure, and OSA.   Clinical Impression  Pt is now s/p revision to L AKA 7/11. The pt is continuing to be able to transfer and ambulate with a hop-to pattern with a RW at a supervision level. He demonstrates WFL AROM of his L hip at this time. Educated pt and wife on HEP handout provided to improve/maintain strength and ROM throughout his L hip. They verbalized understanding. Pt reports his current RW is old and broken and he will need a new one prior to d/c. Notified CM and RN. Recommend pt resume HHPT at d/c. Will continue to follow acutely.      If plan is discharge home, recommend the following: A little help with walking and/or transfers;Assist for transportation;Help with stairs or ramp for entrance;A little help with bathing/dressing/bathroom;Assistance with cooking/housework   Can travel by private vehicle        Equipment Recommendations Rolling walker (2 wheels) (needs a new one, old one is broken)  Recommendations for Other Services       Functional Status Assessment Patient has had a recent decline in their functional status and demonstrates the ability to make significant improvements in function in a reasonable and predictable amount of time.     Precautions / Restrictions Precautions Precautions: Fall Recall of Precautions/Restrictions: Intact Precaution/Restrictions Comments: L AKA 7/11 Restrictions Weight Bearing Restrictions Per Provider Order: No      Mobility  Bed Mobility Overal bed mobility: Modified Independent              General bed mobility comments: Pt able to transition supine <> sit R EOB from elevated HOB without assistance    Transfers Overall transfer level: Needs assistance Equipment used: Rolling walker (2 wheels) Transfers: Sit to/from Stand Sit to Stand: Supervision           General transfer comment: Pt able to power up to stand from low bed height without LOB, supervision for safety    Ambulation/Gait Ambulation/Gait assistance: Supervision Gait Distance (Feet): 50 Feet Assistive device: Rolling walker (2 wheels) Gait Pattern/deviations: Step-to pattern, Decreased step length - right, Decreased stride length (hop-to) Gait velocity: reduced Gait velocity interpretation: <1.8 ft/sec, indicate of risk for recurrent falls   General Gait Details: Pt displays a slow hop-to gait pattern. No LOB noted. Pt needed cues to keep RW on ground and push it rather than lift it to advance it, good carryover noted. Supervision for safety  Stairs            Wheelchair Mobility     Tilt Bed    Modified Rankin (Stroke Patients Only)       Balance Overall balance assessment: Needs assistance Sitting-balance support: No upper extremity supported, Feet supported Sitting balance-Leahy Scale: Good     Standing balance support: Bilateral upper extremity supported, During functional activity, Reliant on assistive device for balance Standing balance-Leahy Scale: Poor Standing balance comment: reliant on RW  Pertinent Vitals/Pain Pain Assessment Pain Assessment: Faces Faces Pain Scale: Hurts little more Pain Location: LLE Pain Descriptors / Indicators: Discomfort, Grimacing, Guarding, Operative site guarding Pain Intervention(s): Limited activity within patient's tolerance, Monitored during session, Repositioned    Home Living Family/patient expects to be discharged to:: Private residence Living Arrangements: Spouse/significant  other Available Help at Discharge: Family;Available 24 hours/day Type of Home: House Home Access: Ramped entrance Entrance Stairs-Rails: None     Home Layout: One level Home Equipment: Rollator (4 wheels);Cane - single point;Shower seat;Electric scooter;Wheelchair - manual;Grab bars - toilet;Grab bars - tub/shower;Hand held shower head;Transport chair (current RW is broken) Additional Comments: Pt reports he is currently recieving therapy services 2x/week.    Prior Function Prior Level of Function : Independent/Modified Independent             Mobility Comments: Ambulates using transport chair, propelling himself. Pt reports occasionally using walker for transfers. Denies falls in the last 92mo. ADLs Comments: ModI ADLs. Wife manages his medications and handles household responsibilites.     Extremity/Trunk Assessment   Upper Extremity Assessment Upper Extremity Assessment: Overall WFL for tasks assessed    Lower Extremity Assessment Lower Extremity Assessment: LLE deficits/detail LLE Deficits / Details: revision to L AKA 7/11; able to detect touch distally at residual limb; WFL AROM at hip    Cervical / Trunk Assessment Cervical / Trunk Assessment: Normal  Communication   Communication Communication: Impaired Factors Affecting Communication: Hearing impaired    Cognition Arousal: Alert Behavior During Therapy: WFL for tasks assessed/performed   PT - Cognitive impairments: No apparent impairments                         Following commands: Intact       Cueing Cueing Techniques: Verbal cues     General Comments General comments (skin integrity, edema, etc.): Provided pt and wife with HEP handout for new AKA consisting of: prone hip flexor stretch, prone hip extension AROM, supine hip extension isometrics, supine hip adduction AROM, sidelying hip abduction AROM, SLR; educated pt on gently touching residual limb to manage potential phantom limb pain and on  importance of skin checks bil    Exercises     Assessment/Plan    PT Assessment Patient needs continued PT services  PT Problem List Decreased activity tolerance;Decreased balance;Decreased mobility       PT Treatment Interventions DME instruction;Gait training;Functional mobility training;Therapeutic activities;Therapeutic exercise;Balance training;Patient/family education;Neuromuscular re-education    PT Goals (Current goals can be found in the Care Plan section)  Acute Rehab PT Goals Patient Stated Goal: Return Home PT Goal Formulation: With patient/family Time For Goal Achievement: 01/19/24 Potential to Achieve Goals: Good    Frequency Min 2X/week     Co-evaluation               AM-PAC PT 6 Clicks Mobility  Outcome Measure Help needed turning from your back to your side while in a flat bed without using bedrails?: None Help needed moving from lying on your back to sitting on the side of a flat bed without using bedrails?: None Help needed moving to and from a bed to a chair (including a wheelchair)?: A Little Help needed standing up from a chair using your arms (e.g., wheelchair or bedside chair)?: A Little Help needed to walk in hospital room?: A Little Help needed climbing 3-5 steps with a railing? : A Lot 6 Click Score: 19    End of Session  Activity Tolerance: Patient tolerated treatment well Patient left: with call bell/phone within reach;in bed;with family/visitor present;Other (comment) (sitting EOB (no alarm on upon arrival with pt sitting EOB then also)) Nurse Communication: Mobility status;Other (comment) (need for RW prior to d/c) PT Visit Diagnosis: Difficulty in walking, not elsewhere classified (R26.2);Unsteadiness on feet (R26.81);Other abnormalities of gait and mobility (R26.89)    Time: 8956-8946 PT Time Calculation (min) (ACUTE ONLY): 10 min   Charges:   PT Evaluation $PT Re-evaluation: 1 Re-eval   PT General Charges $$ ACUTE PT  VISIT: 1 Visit         Theo Ferretti, PT, DPT Acute Rehabilitation Services  Office: [phone]   Theo CHRISTELLA Ferretti 01/05/2024, 11:28 AM

## 2024-01-05 NOTE — TOC Transition Note (Addendum)
 Transition of Care [facility]) - Discharge Note   Patient Details  Name: GEO SLONE MRN: [id number] Date of Birth: 1946/06/25  Transition of Care [facility]) CM/SW Contact:  Robynn Eileen Hoose, RN Phone Number: 01/05/2024, 10:10 AM   Clinical Narrative:   Patient is being discharged home today. Shylise with [facility] made aware.  1141: Secure message noted from floor nurse that patient needs new RW. Spoke with patient, confirmed that he has [employer]. Patient is aware that RW request would have to go through he [place]. Patient also reports that he could buy a RW on his own as well. [facility] just in case patient unable to find RW in the community.     Final next level of care: Home w Home Health Services Barriers to Discharge: No Barriers Identified   Patient Goals and CMS Choice            Discharge Placement                       Discharge Plan and Services Additional resources added to the After Visit Summary for     Discharge Planning Services: CM Consult                      HH Arranged: RN, PT, OT          Social Drivers of Health (SDOH) Interventions SDOH Screenings   Food Insecurity: No Food Insecurity (01/01/2024)  Housing: Low Risk  (01/01/2024)  Transportation Needs: No Transportation Needs (01/01/2024)  Utilities: Not At Risk (01/01/2024)  Financial Resource Strain: Low Risk  (08/25/2022)   Received from [facility]  Social Connections: Moderately Isolated (01/01/2024)  Stress: No Stress Concern Present (08/25/2022)   Received from [facility]  Tobacco Use: Medium Risk (01/04/2024)     Readmission Risk Interventions    11/26/2023    3:01 PM  Readmission Risk Prevention Plan  Medication Screening Complete  Transportation Screening Complete

## 2024-01-05 NOTE — Progress Notes (Signed)
 Subjective  - POD # 1, status post above-knee potation  Had severe pain episode yesterday which was treated with morphine , oxycodone , and Toradol .  He is no longer having any discomfort   Physical Exam:  Dressing clean dry and intact       Assessment/Plan:  POD # 1  Status post left above-knee amputation: Cultures grew out Enterococcus.  On plan for 2 weeks of Bactrim  and amoxicillin .  If patient continues to be pain-free, I believe he can be discharged home later this afternoon.  He will need home health for PT, dressing changes, and OT  Wells Yolani Vo 01/05/2024 9:43 AM --  Vitals:   01/05/24 0600 01/05/24 0725  BP: 126/75 129/71  Pulse: 79 75  Resp: 16 17  Temp: (!) 97.3 F (36.3 C) 98.5 F (36.9 C)  SpO2: 99% 100%    Intake/Output Summary (Last 24 hours) at 01/05/2024 0943 Last data filed at 01/04/2024 1838 Gross per 24 hour  Intake 480 ml  Output 2100 ml  Net -1620 ml     Laboratory CBC    Component Value Date/Time   WBC 9.0 01/03/2024 0733   HGB 10.6 (L) 01/03/2024 0733   HCT 31.7 (L) 01/03/2024 0733   PLT 250 01/03/2024 0733    BMET    Component Value Date/Time   NA 141 01/04/2024 0640   K 4.7 01/04/2024 0640   CL 105 01/04/2024 0640   CO2 29 01/04/2024 0640   GLUCOSE 112 (H) 01/04/2024 0640   BUN 13 01/04/2024 0640   CREATININE 0.71 01/04/2024 0640   CALCIUM  9.1 01/04/2024 0640   GFRNONAA >60 01/04/2024 0640   GFRAA >60 07/31/2019 1241    COAG Lab Results  Component Value Date   INR 1.1 11/13/2023   INR 1.0 07/29/2020   INR 1.06 02/28/2016   No results found for: PTT  Antibiotics Anti-infectives (From admission, onward)    Start     Dose/Rate Route Frequency Ordered Stop   01/05/24 1000  sulfamethoxazole -trimethoprim  (BACTRIM  DS) 800-160 MG per tablet 1 tablet        1 tablet Oral Every 12 hours 01/05/24 0904     01/05/24 1000  amoxicillin  (AMOXIL ) capsule 1,000 mg        1,000 mg Oral Every 12 hours 01/05/24 0904      01/04/24 1300  vancomycin  (VANCOCIN ) IVPB 1000 mg/200 mL premix  Status:  Discontinued        1,000 mg 200 mL/hr over 60 Minutes Intravenous  Once 01/02/24 0955 01/02/24 1109   01/03/24 0845  ceFEPIme  (MAXIPIME ) 2 g in sodium chloride  0.9 % 100 mL IVPB  Status:  Discontinued        2 g 200 mL/hr over 30 Minutes Intravenous Every 8 hours 01/03/24 0753 01/05/24 0903   01/02/24 1200  vancomycin  (VANCOCIN ) IVPB 1000 mg/200 mL premix  Status:  Discontinued        1,000 mg 200 mL/hr over 60 Minutes Intravenous Every 12 hours 01/02/24 1109 01/05/24 0903   01/01/24 2200  doxycycline  (VIBRA -TABS) tablet 100 mg  Status:  Discontinued        100 mg Oral Every 12 hours 01/01/24 1605 01/03/24 0716   01/01/24 1800  cephALEXin  (KEFLEX ) capsule 500 mg  Status:  Discontinued        500 mg Oral 4 times daily 01/01/24 1605 01/03/24 0716   01/01/24 1148  ceFAZolin  (ANCEF ) IVPB 2g/100 mL premix        2 g 200 mL/hr  over 30 Minutes Intravenous 30 min pre-op 01/01/24 1148 01/01/24 1343        V. Malvina Serene CLORE, M.D., [facility] [place] Office: [phone] Pager:  [phone]

## 2024-01-05 NOTE — Progress Notes (Signed)
 Removed IV-CDI. Reviewed d/c instructions with patient and wife. Answered questions. Discussed aftercare for incision. Wheeled stable patient and belongings to main entrance where he was picked up by his wife.

## 2024-01-06 NOTE — Discharge Summary (Signed)
 Discharge Summary  Patient ID: TRACKER MANCE [id number] 77 y.o. 03-05-1946  Admit date: 01/01/2024  Discharge date and time: 01/05/2024 12:25 PM   Admitting Physician: Gaile LELON New, MD   Discharge Physician: same  Admission Diagnoses: Non-healing surgical wound, subsequent encounter [T81.89XD] Surgical wound infection [T81.49XA]  Discharge Diagnoses: same  Admission Condition: fair  Discharged Condition: fair  Indication for Admission: Postop care  Hospital Course: [name] is a 78 year old male who previously underwent below the knee amputation.  He was seen in the office and noted to have an infected BKA stump.  He was brought to the operating room on 01/01/2024 by Dr. New and underwent irrigation and debridement with wound VAC placement and started on IV antibiotics.  Unfortunately he required return trip to the operating room on 01/04/2024 with conversion to above-the-knee amputation by Dr. New.  Based on pharmacy recommendations he was prescribed Bactrim  and amoxicillin  and will be continued on these medications for 2 weeks.  TOC was consulted to resume home health.  He was discharged home in stable condition.  Consults: None  Treatments: surgery: Irrigation and debridement with wound VAC placement nonhealing left BKA on 01/01/2024 by Dr. 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 New  Discharge Exam: See progress note 01/05/24 Vitals:   01/05/24 0600 01/05/24 0725  BP: 126/75 129/71  Pulse: 79 75  Resp: 16 17  Temp: (!) 97.3 F (36.3 C) 98.5 F (36.9 C)  SpO2: 99% 100%     Disposition: Discharge disposition: 01-Home or Self Care       Patient Instructions:  Allergies as of 01/05/2024       Reactions   Dilaudid [hydromorphone Hcl] Other (See Comments)   CRASHES per patient Talked to nurse and she said pt is afraid to take dilaudid because pt states he flat-lined when they gave it to him.SABRASABRAPt doesn't mind taking Percocet     Clindamycin/lincomycin Other (See Comments)   Blisters in mouth   Oxcarbazepine Other (See Comments)   Other reaction(s): Delirium        Medication List     STOP taking these medications    doxycycline  100 MG capsule Commonly known as: VIBRAMYCIN        TAKE these medications    amoxicillin  500 MG capsule Commonly known as: AMOXIL  Take 2 capsules (1,000 mg total) by mouth every 12 (twelve) hours for 14 days.   aspirin  EC 81 MG tablet Take 81 mg by mouth daily.   atorvastatin  80 MG tablet Commonly known as: LIPITOR  Take 80 mg by mouth at bedtime.   buPROPion  150 MG 24 hr tablet Commonly known as: WELLBUTRIN  XL Take 150 mg by mouth in the morning.   carboxymethylcellulose 0.5 % Soln Commonly known as: REFRESH PLUS Place 1 drop into both eyes 3 (three) times daily as needed (dry eyes).   cyanocobalamin  500 MCG tablet Commonly known as: VITAMIN B12 Take 500 mcg by mouth daily.   docusate sodium  100 MG capsule Commonly known as: COLACE Take 1 capsule (100 mg total) by mouth daily. What changed:  when to take this reasons to take this   DULoxetine  30 MG capsule Commonly known as: CYMBALTA  Take 30 mg by mouth 2 (two) times daily.   feeding supplement Liqd Take 237 mLs by mouth daily. Chocolate   melatonin 3 MG Tabs tablet Take 3 mg by mouth at bedtime as needed (Sleep).   omeprazole 20 MG capsule Commonly known as: PRILOSEC Take 20 mg by  mouth in the morning.   oxyCODONE -acetaminophen  5-325 MG tablet Commonly known as: PERCOCET/ROXICET Take 1 tablet by mouth every 6 (six) hours as needed for moderate pain (pain score 4-6). What changed:  how much to take when to take this   sulfamethoxazole -trimethoprim  800-160 MG tablet Commonly known as: BACTRIM  DS Take 1 tablet by mouth every 12 (twelve) hours for 14 days.   tamsulosin  0.4 MG Caps capsule Commonly known as: FLOMAX  Take 0.4 mg by mouth in the morning.       Activity: activity as  tolerated Diet: regular diet Wound Care: keep wound clean and dry  Follow-up with [facility] in 3 weeks.  Signed: Donnice Sender, PA-C 01/06/2024 8:58 AM [facility] Office: [phone]

## 2024-01-07 ENCOUNTER — Ambulatory Visit

## 2024-01-07 LAB — SURGICAL PATHOLOGY

## 2024-01-14 ENCOUNTER — Telehealth: Payer: Self-pay | Admitting: Physical Medicine & Rehabilitation

## 2024-01-14 NOTE — Telephone Encounter (Signed)
 Patient is asking if his appt on the 24th is still needed. He had to go back for further amputation due to an infection and was discharged on 7/8

## 2024-01-17 ENCOUNTER — Encounter: Admitting: Physical Medicine & Rehabilitation

## 2024-01-21 ENCOUNTER — Telehealth: Payer: Self-pay

## 2024-01-21 NOTE — Telephone Encounter (Signed)
 Pt called and put his wife on the phone to let us  know he fell on his amputation site earlier today and called EMS. They cleaned the area and felt everything looked good. He is having some light pink drainage after falling. EMS redressed the site. Staples are intact. Advised her to continue to monitor the area and let us  know if anything changes/worsens. She is aware to call us  if the drainage does not gradually decrease, if there is any fever, odor, pus, pain-and to keep area clean and dry. She verbalized understanding. Pt has f/u in 2 weeks.

## 2024-01-31 NOTE — Progress Notes (Signed)
 POST OPERATIVE OFFICE NOTE    CC:  F/u for surgery  HPI:  This is a 78 y.o. male who is s/p left AKA on 01/04/2024 by Dr. Serene.  He previously underwent left BKA on 11/21/2023 and subsequent I&D with vac placement on 01/02/2024 all by Dr. Serene.   Per note, pt had a fall on his AKA stump on 01/21/2024 and EMS was called.  He states that when he fell, there was a lot of fluid that was clear red fluid and he felt he had busted open his stump.  He was evaluated and his stump and incision were in tact.   He denies any pain or further drainage from the stump.  He has not had any fevers.  He continues to get good sleep at night.     Allergies  Allergen Reactions   Dilaudid [Hydromorphone Hcl] Other (See Comments)    CRASHES per patient Talked to nurse and she said pt is afraid to take dilaudid because pt states he flat-lined when they gave it to him.SABRASABRAPt doesn't mind taking Percocet    Clindamycin/Lincomycin Other (See Comments)    Blisters in mouth   Oxcarbazepine Other (See Comments)    Other reaction(s): Delirium    Current Outpatient Medications  Medication Sig Dispense Refill   aspirin  EC 81 MG tablet Take 81 mg by mouth daily.     atorvastatin  (LIPITOR ) 80 MG tablet Take 80 mg by mouth at bedtime.     buPROPion  (WELLBUTRIN  XL) 150 MG 24 hr tablet Take 150 mg by mouth in the morning.     carboxymethylcellulose (REFRESH PLUS) 0.5 % SOLN Place 1 drop into both eyes 3 (three) times daily as needed (dry eyes).     cyanocobalamin  (VITAMIN B12) 500 MCG tablet Take 500 mcg by mouth daily.     docusate sodium  (COLACE) 100 MG capsule Take 1 capsule (100 mg total) by mouth daily. (Patient taking differently: Take 100 mg by mouth daily as needed for mild constipation or moderate constipation.)     DULoxetine  (CYMBALTA ) 30 MG capsule Take 30 mg by mouth 2 (two) times daily.     feeding supplement (ENSURE ENLIVE / ENSURE PLUS) LIQD Take 237 mLs by mouth daily. Chocolate     melatonin 3 MG  TABS tablet Take 3 mg by mouth at bedtime as needed (Sleep).     omeprazole (PRILOSEC) 20 MG capsule Take 20 mg by mouth in the morning.     oxyCODONE -acetaminophen  (PERCOCET/ROXICET) 5-325 MG tablet Take 1 tablet by mouth every 6 (six) hours as needed for moderate pain (pain score 4-6). 20 tablet 0   tamsulosin  (FLOMAX ) 0.4 MG CAPS capsule Take 0.4 mg by mouth in the morning.     No current facility-administered medications for this visit.     ROS:  See HPI  Physical Exam:  Today's Vitals   02/04/24 1025  BP: 129/80  Pulse: 78  Temp: 98.4 F (36.9 C)  TempSrc: Temporal  Weight: 201 lb (91.2 kg)   Body mass index is 29.68 kg/m.   Incision:        Assessment/Plan:  This is a 78 y.o. male who is s/p:  left AKA on 01/04/2024 by Dr. Serene.  He previously underwent left BKA on 11/21/2023 and subsequent I&D with vac placement on 01/02/2024 all by Dr. Serene.   -incision looks great.  He did have a fall a couple of weeks ago without any dehiscence but did have some drainage at the time.  He has not had any further drainage since then. I did remove some staples today.  Will have him come back in about 10 days to get the rest of his staples out.  He is going to be getting his prosthesis from the [facility] in [place].   Once all his staples are out, he will f/u as needed.    Lucie Apt, [facility] [phone]   Clinic MD:  Serene

## 2024-02-04 ENCOUNTER — Ambulatory Visit: Attending: Surgery | Admitting: Physician Assistant

## 2024-02-04 VITALS — BP 129/80 | HR 78 | Temp 98.4°F | Wt 201.0 lb

## 2024-02-04 DIAGNOSIS — Z89612 Acquired absence of left leg above knee: Secondary | ICD-10-CM

## 2024-02-19 NOTE — Progress Notes (Unsigned)
 POST OPERATIVE OFFICE NOTE    CC:  F/u for surgery  HPI:  This is a 78 y.o. male who is s/p  left AKA on 01/04/2024 by Dr. Serene.  He previously underwent left BKA on 11/21/2023 and subsequent I&D with vac placement on 01/02/2024 all by Dr. Serene.    Per note, pt had a fall on his AKA stump on 01/21/2024 and EMS was called.  He states that when he fell, there was a lot of fluid that was clear red fluid and he felt he had busted open his stump.  He was evaluated and his stump and incision were in tact.   He was seen on 02/04/2024 and at that time, he was not having any pain or further drainage from the stump.  He did not have fevers and was continuing to sleep well.   He did have some staples removed and was scheduled to come back to have remaining staples removed.  He has plans for getting prosthesis from the [facility] in [place].    Pt returns today for follow up.  Pt states ***   Allergies  Allergen Reactions   Dilaudid [Hydromorphone Hcl] Other (See Comments)    CRASHES per patient Talked to nurse and she said pt is afraid to take dilaudid because pt states he flat-lined when they gave it to him.SABRASABRAPt doesn't mind taking Percocet    Clindamycin/Lincomycin Other (See Comments)    Blisters in mouth   Oxcarbazepine Other (See Comments)    Other reaction(s): Delirium    Current Outpatient Medications  Medication Sig Dispense Refill   aspirin  EC 81 MG tablet Take 81 mg by mouth daily.     atorvastatin  (LIPITOR ) 80 MG tablet Take 80 mg by mouth at bedtime.     buPROPion  (WELLBUTRIN  XL) 150 MG 24 hr tablet Take 150 mg by mouth in the morning.     carboxymethylcellulose (REFRESH PLUS) 0.5 % SOLN Place 1 drop into both eyes 3 (three) times daily as needed (dry eyes).     cyanocobalamin  (VITAMIN B12) 500 MCG tablet Take 500 mcg by mouth daily.     docusate sodium  (COLACE) 100 MG capsule Take 1 capsule (100 mg total) by mouth daily. (Patient taking differently: Take 100 mg by mouth daily as  needed for mild constipation or moderate constipation.)     DULoxetine  (CYMBALTA ) 30 MG capsule Take 30 mg by mouth 2 (two) times daily.     feeding supplement (ENSURE ENLIVE / ENSURE PLUS) LIQD Take 237 mLs by mouth daily. Chocolate     melatonin 3 MG TABS tablet Take 3 mg by mouth at bedtime as needed (Sleep).     omeprazole (PRILOSEC) 20 MG capsule Take 20 mg by mouth in the morning.     oxyCODONE -acetaminophen  (PERCOCET/ROXICET) 5-325 MG tablet Take 1 tablet by mouth every 6 (six) hours as needed for moderate pain (pain score 4-6). 20 tablet 0   tamsulosin  (FLOMAX ) 0.4 MG CAPS capsule Take 0.4 mg by mouth in the morning.     No current facility-administered medications for this visit.     ROS:  See HPI  Physical Exam:  ***  Incision:  *** Extremities:  *** Neuro: *** Abdomen:  ***    Assessment/Plan:  This is a 78 y.o. male who is s/p: left AKA on 01/04/2024 by Dr. Serene.  He previously underwent left BKA on 11/21/2023 and subsequent I&D with vac placement on 01/02/2024 all by Dr. Serene.   -***   Lucie Apt,  [facility] [phone]   Clinic MD:  Sheree

## 2024-02-20 ENCOUNTER — Ambulatory Visit: Attending: Vascular Surgery | Admitting: Physician Assistant

## 2024-02-20 ENCOUNTER — Encounter: Payer: Self-pay | Admitting: Physician Assistant

## 2024-02-20 VITALS — BP 139/77 | HR 77 | Temp 98.4°F

## 2024-02-20 DIAGNOSIS — Z89612 Acquired absence of left leg above knee: Secondary | ICD-10-CM

## 2024-03-12 ENCOUNTER — Ambulatory Visit: Admitting: Orthopaedic Surgery

## 2024-04-09 ENCOUNTER — Other Ambulatory Visit: Payer: Self-pay

## 2024-04-28 ENCOUNTER — Encounter: Payer: Self-pay | Admitting: Radiology

## 2024-05-25 ENCOUNTER — Other Ambulatory Visit: Payer: Self-pay

## 2024-05-25 ENCOUNTER — Emergency Department (HOSPITAL_COMMUNITY): Admission: EM | Admit: 2024-05-25 | Discharge: 2024-05-25 | Disposition: A | Discharge status: Home / Self Care

## 2024-05-25 ENCOUNTER — Encounter (HOSPITAL_COMMUNITY): Payer: Self-pay

## 2024-05-25 ENCOUNTER — Emergency Department (HOSPITAL_COMMUNITY)

## 2024-05-25 DIAGNOSIS — M25512 Pain in left shoulder: Secondary | ICD-10-CM | POA: Insufficient documentation

## 2024-05-25 DIAGNOSIS — I10 Essential (primary) hypertension: Secondary | ICD-10-CM | POA: Insufficient documentation

## 2024-05-25 DIAGNOSIS — W1830XA Fall on same level, unspecified, initial encounter: Secondary | ICD-10-CM | POA: Insufficient documentation

## 2024-05-25 DIAGNOSIS — W19XXXA Unspecified fall, initial encounter: Secondary | ICD-10-CM

## 2024-05-25 DIAGNOSIS — M79644 Pain in right finger(s): Secondary | ICD-10-CM | POA: Insufficient documentation

## 2024-05-25 DIAGNOSIS — Z79899 Other long term (current) drug therapy: Secondary | ICD-10-CM | POA: Insufficient documentation

## 2024-05-25 DIAGNOSIS — I2510 Atherosclerotic heart disease of native coronary artery without angina pectoris: Secondary | ICD-10-CM | POA: Insufficient documentation

## 2024-05-25 DIAGNOSIS — Z8673 Personal history of transient ischemic attack (TIA), and cerebral infarction without residual deficits: Secondary | ICD-10-CM | POA: Insufficient documentation

## 2024-05-25 DIAGNOSIS — Z7982 Long term (current) use of aspirin: Secondary | ICD-10-CM | POA: Insufficient documentation

## 2024-05-25 MED ORDER — ACETAMINOPHEN 325 MG PO TABS
650.0000 mg | ORAL_TABLET | Freq: Once | ORAL | Status: AC
  Administered 2024-05-25: 650 mg via ORAL
  Filled 2024-05-25: qty 2

## 2024-05-25 MED ORDER — LIDOCAINE 5 % EX PTCH
1.0000 | MEDICATED_PATCH | CUTANEOUS | Status: DC
  Administered 2024-05-25: 1 via TRANSDERMAL
  Filled 2024-05-25: qty 1

## 2024-05-25 NOTE — ED Provider Notes (Signed)
 [place] EMERGENCY DEPARTMENT AT [facility] Provider Note   CSN: [id number] Arrival date & time: 05/25/24  9065     Patient presents with: [name] [name] is a 78 y.o. male with PMHx CAD, HLD, HTN, CVA who presents to ED concern for mechanical fall yesterday.  Patient was reaching for his garage door when he accidentally pushed a button on his scooter which caused it to move and led to him falling onto his left AKA site.  Patient stating that most the pain is at the distal end of his left AKA, however he is also having right little finger pain and left shoulder pain.  Denies hip pain.  Denies head trauma, LOC, seizures, blood thinners.  Denies any recent infectious symptoms such as chest pain, SOB, nausea, vomiting, diarrhea, fever.  Patient took 1 dose of Percocet 5-325 last night around 9PM - no other medications were taken for pain.    Fall       Prior to Admission medications   Medication Sig Start Date End Date Taking? Authorizing Provider  aspirin  EC 81 MG tablet Take 81 mg by mouth daily.    [provider]  atorvastatin  (LIPITOR ) 80 MG tablet Take 80 mg by mouth at bedtime.    [provider]  buPROPion  (WELLBUTRIN  XL) 150 MG 24 hr tablet Take 150 mg by mouth in the morning. 10/28/21   [provider]  carboxymethylcellulose (REFRESH PLUS) 0.5 % SOLN Place 1 drop into both eyes 3 (three) times daily as needed (dry eyes).    [provider]  cyanocobalamin  (VITAMIN B12) 500 MCG tablet Take 500 mcg by mouth daily.    [provider]  docusate sodium  (COLACE) 100 MG capsule Take 1 capsule (100 mg total) by mouth daily. Patient taking differently: Take 100 mg by mouth daily as needed for mild constipation or moderate constipation. 12/02/23   Love, Sharlet RAMAN, PA-C  DULoxetine  (CYMBALTA ) 30 MG capsule Take 30 mg by mouth 2 (two) times daily.    [provider]  feeding supplement (ENSURE ENLIVE / ENSURE PLUS) LIQD  Take 237 mLs by mouth daily. Chocolate 12/24/23   [provider]  melatonin 3 MG TABS tablet Take 3 mg by mouth at bedtime as needed (Sleep). 12/20/23   [provider]  omeprazole (PRILOSEC) 20 MG capsule Take 20 mg by mouth in the morning. 10/28/21   [provider]  oxyCODONE -acetaminophen  (PERCOCET/ROXICET) 5-325 MG tablet Take 1 tablet by mouth every 6 (six) hours as needed for moderate pain (pain score 4-6). 01/05/24   Bethanie Cough, PA-C  tamsulosin  (FLOMAX ) 0.4 MG CAPS capsule Take 0.4 mg by mouth in the morning.    [provider]    Allergies: Dilaudid [hydromorphone hcl], Clindamycin/lincomycin, and Oxcarbazepine    Review of Systems  Musculoskeletal:        Leg pain    Updated Vital Signs BP 127/81   Pulse 71   Temp 97.9 F (36.6 C)   Resp 15   Ht 5' 9 (1.753 m)   Wt 86.2 kg   SpO2 99%   BMI 28.06 kg/m   Physical Exam Vitals and nursing note reviewed.  Constitutional:      General: He is not in acute distress.    Appearance: He is not ill-appearing or toxic-appearing.  HENT:     Head: Normocephalic and atraumatic.     Mouth/Throat:     Mouth: Mucous membranes are moist.  Eyes:  General: No scleral icterus.       Right eye: No discharge.        Left eye: No discharge.     Conjunctiva/sclera: Conjunctivae normal.  Cardiovascular:     Rate and Rhythm: Normal rate and regular rhythm.     Pulses: Normal pulses.     Heart sounds: Normal heart sounds. No murmur heard. Pulmonary:     Effort: Pulmonary effort is normal. No respiratory distress.     Breath sounds: Normal breath sounds. No wheezing, rhonchi or rales.  Abdominal:     General: Abdomen is flat.     Palpations: Abdomen is soft.  Musculoskeletal:     Right lower leg: No edema.     Left lower leg: No edema.     Comments: Left AKA: No bruising, erythema, swelling, lacerations, or increased warmth appreciated.  No crepitus palpated.  Sensation to light touch intact.   Area appears NV intact.  Area nontense.  Right hand: +2 radial pulse.  Brisk capillary refill.  No erythema, swelling, or increased warmth.  ROM intact.  Tenderness to palpation of left pinky finger.  Left shoulder: +2 radial pulse.  Brisk capillary refill.  Active ROM intact.  No swelling, erythema or increased warmth.  Skin:    General: Skin is warm and dry.     Findings: No rash.  Neurological:     General: No focal deficit present.     Mental Status: He is alert and oriented to person, place, and time. Mental status is at baseline.  Psychiatric:        Mood and Affect: Mood normal.     (all labs ordered are listed, but only abnormal results are displayed) Labs Reviewed - No data to display  EKG: None  Radiology: DG Shoulder Left Result Date: 05/25/2024 CLINICAL DATA:  Clemens yesterday.  Left shoulder pain. EXAM: DG SHOULDER 2+V*L* COMPARISON:  None Available. FINDINGS: No fracture or bone lesion. Glenohumeral and AC joints are normally aligned. Soft tissues are unremarkable. IMPRESSION: 1. No fracture or dislocation Electronically Signed   By: Alm Parkins M.D.   On: 05/25/2024 11:26   DG Finger Little Right Result Date: 05/25/2024 CLINICAL DATA:  Clemens yesterday.  Right fifth finger pain. EXAM: RIGHT LITTLE FINGER 2+V COMPARISON:  None Available. FINDINGS: No fracture or bone lesion. Joints are normally aligned. Soft tissues are unremarkable. IMPRESSION: No fracture or dislocation Electronically Signed   By: Alm Parkins M.D.   On: 05/25/2024 11:25   DG Femur Min 2 Views Left Result Date: 05/25/2024 CLINICAL DATA:  Fall yesterday. Patient fell onto his left AKA stump. Complaining of pain. EXAM: LEFT FEMUR 2 VIEWS COMPARISON:  None Available. FINDINGS: No fracture.  No bone lesion. Amputated margin of the left femoral shaft is well-defined. No bone resorption to suggest osteomyelitis. Left hip joint is normally spaced and aligned. Soft tissue irregularity along the stump suggest  a laceration or injury. No radiopaque foreign body. IMPRESSION: 1. No fracture. 2. Suspected soft tissue injury to the AKAa stump. Electronically Signed   By: Alm Parkins M.D.   On: 05/25/2024 11:24     Procedures   Medications Ordered in the ED  lidocaine  (LIDODERM ) 5 % 1 patch (1 patch Transdermal Patch Applied 05/25/24 1035)  acetaminophen  (TYLENOL ) tablet 650 mg (650 mg Oral Given 05/25/24 1035)  Medical Decision Making Amount and/or Complexity of Data Reviewed Radiology: ordered.  Risk OTC drugs. Prescription drug management.   This patient presents to the ED for concern of knee, hand, shoulder pain, this involves an extensive number of treatment options, and is a complaint that carries with it a high risk of complications and morbidity.  The differential diagnosis includes hemarthrosis, gout, septic joint, fracture, tendonitis, carpal tunnel syndrome, muscle strain, bursitis, compartment syndrome   Co morbidities that complicate the patient evaluation  See HPI above   Additional history obtained:  Dr. Cena PCP   Problem List / ED Course / Critical interventions / Medication management  Patient presents ED concern for left femur, right hand, left shoulder pain after mechanical fall yesterday.  Physical exam reassuring.  Patient afebrile with stable vitals. I ordered imaging studies including left femur/right hand/left shoulder x-ray. I independently visualized and interpreted imaging. I agree with the radiologist interpretation of no acute process.  Shared results with patient.  Recommended following up with PCP. Staffed with Dr. Gennaro who agrees with plan. I have reviewed the patients home medicines and have made adjustments as needed The patient has been appropriately medically screened and/or stabilized in the ED. I have low suspicion for any other emergent medical condition which would require further screening, evaluation  or treatment in the ED or require inpatient management. At time of discharge the patient is hemodynamically stable and in no acute distress. I have discussed work-up results and diagnosis with patient and answered all questions. Patient is agreeable with discharge plan. We discussed strict return precautions for returning to the emergency department and they verbalized understanding.     Social Determinants of Health:  geriatric      Final diagnoses:  Fall, initial encounter    ED Discharge Orders     None          Hoy Nidia FALCON, [place] 05/25/24 1138    [address], DO 05/27/24 1007

## 2024-05-25 NOTE — ED Triage Notes (Signed)
 Pt c.o fall yesterday while trying to pull down his garage door, pt has a left AKA and fell onto his stump onto concrete. Pt c.o severe stump pain. Pt went to UC yesterday and had a negative xray. Pt also c.o right little finger pain. Pt denies head injury. No open wounds/abrasions

## 2024-05-25 NOTE — Discharge Instructions (Signed)
 Please follow-up with your primary care provider in the next 48-72 hours.  Seek emergency care experiencing any new or worsening symptoms.

## 2024-06-03 ENCOUNTER — Telehealth: Payer: Self-pay

## 2024-06-03 NOTE — Telephone Encounter (Signed)
 Patient recalled to report he fell on 11/30 injuring his L AKA stump.  He went to the ED where xrays were taken and were negative for broken femur.  He reports his stump is very bruised but is better than when he first fell.  He reported no broken skin/tissue and that his incision scar is intact.  Advised pt to continue to monitor his stump. Advised to use shrinker sock to compress the stump.  He can also alternate cool and warm compresses for comfort. Advised to call back if he develops signs of infection, change in color or temperature of his stump.

## 2024-06-30 ENCOUNTER — Emergency Department (HOSPITAL_COMMUNITY)

## 2024-06-30 ENCOUNTER — Emergency Department (HOSPITAL_COMMUNITY)
Admission: EM | Admit: 2024-06-30 | Discharge: 2024-06-30 | Disposition: A | Discharge status: Home / Self Care | Attending: Emergency Medicine | Admitting: Emergency Medicine

## 2024-06-30 ENCOUNTER — Other Ambulatory Visit: Payer: Self-pay

## 2024-06-30 DIAGNOSIS — I10 Essential (primary) hypertension: Secondary | ICD-10-CM | POA: Insufficient documentation

## 2024-06-30 DIAGNOSIS — Z87891 Personal history of nicotine dependence: Secondary | ICD-10-CM | POA: Diagnosis not present

## 2024-06-30 DIAGNOSIS — Z89612 Acquired absence of left leg above knee: Secondary | ICD-10-CM | POA: Diagnosis not present

## 2024-06-30 DIAGNOSIS — Z8673 Personal history of transient ischemic attack (TIA), and cerebral infarction without residual deficits: Secondary | ICD-10-CM | POA: Diagnosis not present

## 2024-06-30 DIAGNOSIS — Z85828 Personal history of other malignant neoplasm of skin: Secondary | ICD-10-CM | POA: Insufficient documentation

## 2024-06-30 DIAGNOSIS — N132 Hydronephrosis with renal and ureteral calculous obstruction: Secondary | ICD-10-CM | POA: Insufficient documentation

## 2024-06-30 DIAGNOSIS — N200 Calculus of kidney: Secondary | ICD-10-CM

## 2024-06-30 DIAGNOSIS — I2510 Atherosclerotic heart disease of native coronary artery without angina pectoris: Secondary | ICD-10-CM | POA: Diagnosis not present

## 2024-06-30 DIAGNOSIS — R10A1 Flank pain, right side: Secondary | ICD-10-CM | POA: Diagnosis present

## 2024-06-30 LAB — CBC
HCT: 39.3 % (ref 39.0–52.0)
Hemoglobin: 13.9 g/dL (ref 13.0–17.0)
MCH: 30 pg (ref 26.0–34.0)
MCHC: 35.4 g/dL (ref 30.0–36.0)
MCV: 84.9 fL (ref 80.0–100.0)
Platelets: 264 K/uL (ref 150–400)
RBC: 4.63 MIL/uL (ref 4.22–5.81)
RDW: 12.5 % (ref 11.5–15.5)
WBC: 9.5 K/uL (ref 4.0–10.5)
nRBC: 0 % (ref 0.0–0.2)

## 2024-06-30 LAB — COMPREHENSIVE METABOLIC PANEL WITH GFR
ALT: 14 U/L (ref 0–44)
AST: 19 U/L (ref 15–41)
Albumin: 4.5 g/dL (ref 3.5–5.0)
Alkaline Phosphatase: 141 U/L — ABNORMAL HIGH (ref 38–126)
Anion gap: 12 (ref 5–15)
BUN: 15 mg/dL (ref 8–23)
CO2: 25 mmol/L (ref 22–32)
Calcium: 9.7 mg/dL (ref 8.9–10.3)
Chloride: 104 mmol/L (ref 98–111)
Creatinine, Ser: 0.83 mg/dL (ref 0.61–1.24)
GFR, Estimated: >60 mL/min
Glucose, Bld: 112 mg/dL — ABNORMAL HIGH (ref 70–99)
Potassium: 4 mmol/L (ref 3.5–5.1)
Sodium: 141 mmol/L (ref 135–145)
Total Bilirubin: 0.6 mg/dL (ref 0.0–1.2)
Total Protein: 7.6 g/dL (ref 6.5–8.1)

## 2024-06-30 LAB — URINALYSIS, ROUTINE W REFLEX MICROSCOPIC
Bacteria, UA: NONE SEEN
Bilirubin Urine: NEGATIVE
Glucose, UA: NEGATIVE mg/dL
Ketones, ur: NEGATIVE mg/dL
Leukocytes,Ua: NEGATIVE
Nitrite: NEGATIVE
Protein, ur: NEGATIVE mg/dL
RBC / HPF: >50 RBC/hpf (ref 0–5)
Specific Gravity, Urine: 1.008 (ref 1.005–1.030)
pH: 8 (ref 5.0–8.0)

## 2024-06-30 LAB — CK: Total CK: 126 U/L (ref 49–397)

## 2024-06-30 LAB — TROPONIN T, HIGH SENSITIVITY: Troponin T High Sensitivity: 17 ng/L (ref 0–19)

## 2024-06-30 LAB — LIPASE, BLOOD: Lipase: 30 U/L (ref 11–51)

## 2024-06-30 MED ORDER — ONDANSETRON 4 MG PO TBDP
4.0000 mg | ORAL_TABLET | Freq: Once | ORAL | Status: AC | PRN
  Administered 2024-06-30: 4 mg via ORAL
  Filled 2024-06-30: qty 1

## 2024-06-30 MED ORDER — TAMSULOSIN HCL 0.4 MG PO CAPS: 0.4000 mg | ORAL_CAPSULE | Freq: Every day | ORAL | 0 refills | Status: AC

## 2024-06-30 MED ORDER — OXYCODONE HCL 5 MG PO TABS: 5.0000 mg | ORAL_TABLET | Freq: Four times a day (QID) | ORAL | 0 refills | Status: AC | PRN

## 2024-06-30 MED ORDER — OXYCODONE HCL 5 MG PO TABS: 5.0000 mg | ORAL_TABLET | ORAL | 0 refills | Status: DC | PRN

## 2024-06-30 MED ORDER — IOHEXOL 350 MG/ML SOLN
75.0000 mL | Freq: Once | INTRAVENOUS | Status: AC | PRN
  Administered 2024-06-30: 75 mL via INTRAVENOUS

## 2024-06-30 NOTE — ED Provider Triage Note (Signed)
 Emergency Medicine Provider Triage Evaluation Note  KIANTE CIAVARELLA , a 79 y.o. male  was evaluated in triage.  Pt complains of 2 day Hx of R sided flank pain with dark urine described as black x 1 episode followed by continued dark urine. [facility] when [facility] started to have nausea and worsening R flank pain described as stabbing.   Endorses lightheadedness  Denies fever, headache, blurry vision, vertigo, abdominal pain, vomiting, diarrhea, melena, hematochezia, dysuria, R leg swelling,   Review of Systems  Positive: N/a Negative: N/a  Physical Exam  BP (!) 154/100   Pulse 82   Temp (!) 97.5 F (36.4 C)   Resp 18   SpO2 100%  Gen:   Awake, no distress   Resp:  Normal effort  MSK:   Moves extremities without difficulty  Other:    Medical Decision Making  Medically screening exam initiated at 1:24 PM.  Appropriate orders placed.  Quintin LITTIE Sharps was informed that the remainder of the evaluation will be completed by another provider, this initial triage assessment does not replace that evaluation, and the importance of remaining in the ED until their evaluation is complete.     Beola Terrall RAMAN, [place] 06/30/24 1328

## 2024-06-30 NOTE — ED Triage Notes (Signed)
 Patient states had an episode of pain and dark urine two nights ago. Went to appointment at the [facility] today and was discharged. When he got home he had severe R flank pain, dizziness, chills, and dark urine again (had resolved since two nights ago).

## 2024-06-30 NOTE — ED Provider Notes (Signed)
 " [place] [facility] Provider Note   HPI/ROS    History obtained from patient and family.  [name] [name] is a 79 y.o. male who presents for Flank Pain and who  has a past medical history of Anxiety, Cancer (HCC), Carpal tunnel syndrome, Coronary artery disease, Depression, Hepatitis, Hypercholesteremia, Hypertension, Mononeuropathy, Myocardial infarction (HCC) (2010ish), Peripheral vascular disease, Pneumonia, PTSD (post-traumatic stress disorder), PTSD (post-traumatic stress disorder), Sleep apnea, and Stroke (HCC).  Patient presents today for left flank pain and dark urine over the last 2 days.  States that he had dark urine 2 days ago and started drinking water to help keep himself hydrated.  When he got done with his appointment at the [facility] today and was sent back home he started endorsing severe right-sided flank pain, nausea, dizziness, and chills.  He is currently denying all of the symptoms and states that his pain is largely resolved.  Currently denying fevers, chills, chest pain, shortness breath, nausea, vomiting, diarrhea.  Has never had a kidney stone before.   MDM   I have reviewed the nursing documentation, vital signs, as well as the past medical history, surgical history, family history, and social history.  Initial Assessment:  Hemodynamically stable on initial evaluation.  Labs obtained in first look largely unremarkable.  No significant leukocytosis, anemia, or thrombocytopenia.  No AKI electrolyte abnormality or transaminitis on metabolic panel.  CK within normal limits and UA without evidence of UTI.  Troponin negative at 17.  Does have gross blood in urine.  EKG here sinus rhythm with infrequent PVCs.  No other obvious ischemia, dysrhythmia, or high-grade AV block.  CT scan here with 6 mm stone at the right UPJ.  Did not feel this is likely ACS given dark urine and no chest pain, diaphoresis, or radiation of the pain.  Symmetric pulses and no  chest pain tearing the back concerning for aortic pathology.  Given no signs of UTI, ACS, or other significant lab abnormalities feel patient stable for discharge.  Has small right stone at the UPJ that has a good chance of passing on its own.  Will prescribe 7 days of Flomax  and urology referral for outpatient follow-up for kidney stone.  Will also give 3 days of oxycodone  at home for pain.  Patient discharged home in hemodynamically stable condition with strict return precautions.  Disposition:  I discussed the plan for discharge with the patient and/or their surrogate at bedside prior to discharge and they were in agreement with the plan and verbalized understanding of the return precautions provided. All questions answered to the best of my ability. Ultimately, the patient was discharged in stable condition with stable vital signs. I am reassured that they are capable of close follow up and good social support at home.    This patient was staffed with Dr. Patt who supervised the visit and agreed with the plan of care.  Due to the patients current presenting symptoms, physical exam findings, and the workup stated above, it is thought that the etiology of the patients current presentation is:  1. Kidney stone     Clinical Complexity A medically appropriate history, review of systems, and physical exam was performed.  Factors that affect the complexity of this encounter: assessment of correct protocol, laboratory work from this visit, and review of echocardiogram/EKG results  My independent interpretations of diagnostic studies are documented in the ED course above.   If decision rules were used in this patient's evaluation, they  are listed below.   Click here for ABCD2, HEART and other calculators  Patient's presentation is most consistent with acute complicated illness / injury requiring diagnostic workup.  MDM generated using voice dictation software and may contain dictation errors.  Please contact me for any clarification or with any questions.    Physical Exam, PMH, PSH, Family History, and Social Hsitory   Vitals:   06/30/24 1225  BP: (!) 154/100  Pulse: 82  Resp: 18  Temp: (!) 97.5 F (36.4 C)  SpO2: 100%    Physical Exam Constitutional:      Appearance: Normal appearance.  HENT:     Head: Normocephalic and atraumatic.     Mouth/Throat:     Mouth: Mucous membranes are moist.     Pharynx: Oropharynx is clear.  Eyes:     Extraocular Movements: Extraocular movements intact.     Conjunctiva/sclera: Conjunctivae normal.  Cardiovascular:     Rate and Rhythm: Normal rate and regular rhythm.  Pulmonary:     Effort: Pulmonary effort is normal.     Breath sounds: No wheezing, rhonchi or rales.  Abdominal:     General: Abdomen is flat.     Palpations: Abdomen is soft.     Tenderness: There is no abdominal tenderness. There is no right CVA tenderness, left CVA tenderness, guarding or rebound.  Musculoskeletal:     Comments: Left AKA  Skin:    General: Skin is warm and dry.     Capillary Refill: Capillary refill takes less than 2 seconds.  Neurological:     General: No focal deficit present.     Mental Status: He is alert and oriented to person, place, and time.     Past Medical History:  Diagnosis Date   Anxiety    Cancer (HCC)    skin cancer   Carpal tunnel syndrome    Coronary artery disease    DES x4   Depression    Hepatitis    Hepatitis A during [place] War - received treatment   Hypercholesteremia    Hypertension    Mononeuropathy    Left Leg   Myocardial infarction [facility]) 2010ish   Peripheral vascular disease    Pneumonia    PTSD (post-traumatic stress disorder)    PTSD (post-traumatic stress disorder)    Sleep apnea    doesnt use CPAP. Sinus surgery fixed OSA issues   Stroke (HCC)    slight weakness left- 85- 90 % returned     Past Surgical History:  Procedure Laterality Date   AMPUTATION Left 11/21/2023   Procedure:  AMPUTATION BELOW KNEE;  Surgeon: Serene Gaile ORN, MD;  Location: [facility] OR;  Service: Vascular;  Laterality: Left;  block   AMPUTATION Left 01/04/2024   Procedure: AMPUTATION, ABOVE KNEE LEFT;  Surgeon: Serene Gaile ORN, MD;  Location: [facility] OR;  Service: Vascular;  Laterality: Left;   APPLICATION OF WOUND VAC Left 01/01/2024   Procedure: APPLICATION, WOUND VAC AND KERECIS WOUND MATRIX;  Surgeon: Serene Gaile ORN, MD;  Location: [facility] OR;  Service: Vascular;  Laterality: Left;   CARDIAC CATHETERIZATION  11/12/2021   DES stent placed   CARPAL TUNNEL RELEASE Right 06/10/2013   Procedure: RIGHT CARPAL TUNNEL RELEASE;  Surgeon: Lonni CINDERELLA Poli, MD;  Location: [facility] OR;  Service: Orthopedics;  Laterality: Right;   CARPAL TUNNEL RELEASE Left    CATARACT EXTRACTION Bilateral    COLONOSCOPY     CORONARY ANGIOPLASTY     EXCISION HAGLUND'S DEFORMITY WITH ACHILLES TENDON REPAIR Right  06/24/2021   Procedure: RIGHT HAGLUNDS RESECTION;  Surgeon: Harden Jerona GAILS, MD;  Location: [facility] OR;  Service: Orthopedics;  Laterality: Right;   INCISION AND DRAINAGE OF WOUND Left 01/01/2024   Procedure: IRRIGATION AND DEBRIDEMENT WOUND;  Surgeon: Serene Gaile ORN, MD;  [facility];  Service: Vascular;  Laterality: Left;   KNEE ARTHROSCOPY Right    KNEE ARTHROSCOPY Left 08/04/2019   Procedure: LEFT KNEE ARTHROSCOPY WITH PARTIAL MEDIAL MENISCECTOMY;  Surgeon: Vernetta Lonni GRADE, MD;  Location: [facility];  Service: Orthopedics;  Laterality: Left;   LUMBAR SPINE SURGERY     x2   NASAL SINUS SURGERY     stents  2012   TONSILLECTOMY       Family History  Problem Relation Age of Onset   Heart disease Mother        Several other maternal family members   Lung cancer Father        small cell CA/smoked    Social History   Tobacco Use   Smoking status: Former    Current packs/day: 0.00    Average packs/day: 2.0 packs/day for 20.0 years (40.0 ttl pk-yrs)    Types: Cigarettes    Start date: 11/21/1967     Quit date: 11/21/1987    Years since quitting: 36.6   Smokeless tobacco: Never  Substance Use Topics   Alcohol  use: No     Procedures   If procedures were preformed on this patient, they are listed below:  Procedures   Electronically signed by:   Glendia Carlin Ancona, M.D. PGY-2, Emergency Medicine   Please note that this documentation was produced with the assistance of voice-to-text technology and may contain errors.    Ancona Glendia, MD 06/30/24 1723    Patt Alm Macho, MD 07/01/24 [phone]  "

## 2024-06-30 NOTE — ED Triage Notes (Signed)
 Pt c/o right flank pain that started an hour ago. Pt also reports urine is discolored, looks black.

## 2024-06-30 NOTE — Discharge Instructions (Addendum)
 You were seen today for flank pain. While you were here we monitored your vitals, preformed a physical exam, and got labs and imaging. These were all reassuring and there is no indication for any further testing or intervention in the emergency department at this time.  You have a kidney stone.  Things to do:  - Follow up with your primary care provider within the next 1-2 weeks - Call [phone] to schedule urology appointment  Return to the emergency department if you have any new or worsening symptoms including worsening flank pain, inability to urinate, fevers, inability tolerate p.o., or if you have any other concerns.
